# Patient Record
Sex: Male | Born: 1955 | Race: Black or African American | Hispanic: No | Smoking: Current every day smoker
Health system: Southern US, Community
[De-identification: ages and names within clinical notes are randomized; demographics above are authoritative.]

## PROBLEM LIST (undated history)

## (undated) DIAGNOSIS — F1190 Opioid use, unspecified, uncomplicated: Secondary | ICD-10-CM

## (undated) DIAGNOSIS — B1920 Unspecified viral hepatitis C without hepatic coma: Secondary | ICD-10-CM

## (undated) DIAGNOSIS — D759 Disease of blood and blood-forming organs, unspecified: Secondary | ICD-10-CM

## (undated) HISTORY — PX: KNEE SURGERY: SHX244

---

## 2006-10-27 ENCOUNTER — Emergency Department: Payer: Self-pay | Admitting: Emergency Medicine

## 2011-10-13 LAB — DRUG SCREEN, URINE
Amphetamines, Ur Screen: NEGATIVE (ref ?–1000)
Barbiturates, Ur Screen: NEGATIVE (ref ?–200)
Benzodiazepine, Ur Scrn: NEGATIVE (ref ?–200)
Cannabinoid 50 Ng, Ur ~~LOC~~: NEGATIVE (ref ?–50)
Cocaine Metabolite,Ur ~~LOC~~: NEGATIVE (ref ?–300)
MDMA (Ecstasy)Ur Screen: NEGATIVE (ref ?–500)
Methadone, Ur Screen: NEGATIVE (ref ?–300)
Opiate, Ur Screen: POSITIVE (ref ?–300)
Phencyclidine (PCP) Ur S: NEGATIVE (ref ?–25)
Tricyclic, Ur Screen: NEGATIVE (ref ?–1000)

## 2011-10-13 LAB — CBC
HCT: 41.6 % (ref 40.0–52.0)
HGB: 14 g/dL (ref 13.0–18.0)
MCH: 32.7 pg (ref 26.0–34.0)
MCHC: 33.6 g/dL (ref 32.0–36.0)
MCV: 97 fL (ref 80–100)
Platelet: 80 10*3/uL — ABNORMAL LOW (ref 150–440)
RBC: 4.27 10*6/uL — ABNORMAL LOW (ref 4.40–5.90)
RDW: 15.1 % — ABNORMAL HIGH (ref 11.5–14.5)
WBC: 8.4 10*3/uL (ref 3.8–10.6)

## 2011-10-13 LAB — SALICYLATE LEVEL: Salicylates, Serum: <1.7 mg/dL

## 2011-10-13 LAB — COMPREHENSIVE METABOLIC PANEL
Albumin: 3.1 g/dL — ABNORMAL LOW (ref 3.4–5.0)
Alkaline Phosphatase: 120 U/L (ref 50–136)
Anion Gap: 11 (ref 7–16)
Chloride: 108 mmol/L — ABNORMAL HIGH (ref 98–107)
Co2: 22 mmol/L (ref 21–32)
Creatinine: 0.72 mg/dL (ref 0.60–1.30)
EGFR (African American): >60
Glucose: 120 mg/dL — ABNORMAL HIGH (ref 65–99)
Osmolality: 281 (ref 275–301)
Potassium: 4.5 mmol/L (ref 3.5–5.1)
Total Protein: 9.4 g/dL — ABNORMAL HIGH (ref 6.4–8.2)

## 2011-10-13 LAB — ETHANOL
Ethanol %: <0.003 % (ref 0.000–0.080)
Ethanol: <3 mg/dL

## 2011-10-13 LAB — ACETAMINOPHEN LEVEL: Acetaminophen: <2 ug/mL

## 2011-10-13 LAB — TSH: Thyroid Stimulating Horm: 0.28 u[IU]/mL — ABNORMAL LOW

## 2011-10-14 ENCOUNTER — Inpatient Hospital Stay: Payer: Self-pay | Admitting: Internal Medicine

## 2011-10-14 LAB — CK TOTAL AND CKMB (NOT AT ARMC)
CK, Total: 143 U/L (ref 35–232)
CK-MB: <0.5 ng/mL — ABNORMAL LOW (ref 0.5–3.6)
CK-MB: 0.6 ng/mL (ref 0.5–3.6)

## 2011-10-14 LAB — TROPONIN I
Troponin-I: 0.07 ng/mL — ABNORMAL HIGH
Troponin-I: 0.04 ng/mL

## 2011-10-15 LAB — CBC WITH DIFFERENTIAL/PLATELET
Basophil %: 0.2 %
Eosinophil #: 0 10*3/uL (ref 0.0–0.7)
Eosinophil %: 0.1 %
HGB: 13.8 g/dL (ref 13.0–18.0)
Lymphocyte #: 2.2 10*3/uL (ref 1.0–3.6)
Lymphocyte %: 16.1 %
MCH: 32.6 pg (ref 26.0–34.0)
MCHC: 33.8 g/dL (ref 32.0–36.0)
Monocyte #: 0.7 10*3/uL (ref 0.0–0.7)
Monocyte %: 5 %
Neutrophil %: 78.6 %
Platelet: 94 10*3/uL — ABNORMAL LOW (ref 150–440)
RDW: 15 % — ABNORMAL HIGH (ref 11.5–14.5)
WBC: 13.7 10*3/uL — ABNORMAL HIGH (ref 3.8–10.6)

## 2011-10-15 LAB — COMPREHENSIVE METABOLIC PANEL
Albumin: 2.8 g/dL — ABNORMAL LOW (ref 3.4–5.0)
Alkaline Phosphatase: 107 U/L (ref 50–136)
Anion Gap: 11 (ref 7–16)
Calcium, Total: 8.8 mg/dL (ref 8.5–10.1)
Creatinine: 0.78 mg/dL (ref 0.60–1.30)
Glucose: 118 mg/dL — ABNORMAL HIGH (ref 65–99)
Potassium: 3.6 mmol/L (ref 3.5–5.1)
SGOT(AST): 26 U/L (ref 15–37)
SGPT (ALT): 14 U/L
Total Protein: 8.8 g/dL — ABNORMAL HIGH (ref 6.4–8.2)

## 2011-10-15 LAB — PHOSPHORUS: Phosphorus: 3.7 mg/dL (ref 2.5–4.9)

## 2011-10-16 LAB — CBC WITH DIFFERENTIAL/PLATELET
Basophil #: 0 10*3/uL (ref 0.0–0.1)
Basophil %: 0 %
Eosinophil #: 0 10*3/uL (ref 0.0–0.7)
Eosinophil %: 0.4 %
HCT: 38.3 % — ABNORMAL LOW (ref 40.0–52.0)
HGB: 12.8 g/dL — ABNORMAL LOW (ref 13.0–18.0)
Lymphocyte #: 2 10*3/uL (ref 1.0–3.6)
Lymphocyte %: 17.6 %
MCHC: 33.4 g/dL (ref 32.0–36.0)
MCV: 98 fL (ref 80–100)
Monocyte #: 0.6 10*3/uL (ref 0.0–0.7)
Monocyte %: 5.6 %
Neutrophil %: 76.4 %
RBC: 3.91 10*6/uL — ABNORMAL LOW (ref 4.40–5.90)
RDW: 14.8 % — ABNORMAL HIGH (ref 11.5–14.5)
WBC: 11.2 10*3/uL — ABNORMAL HIGH (ref 3.8–10.6)

## 2011-10-16 LAB — T4, FREE: Free Thyroxine: 1.32 ng/dL (ref 0.76–1.46)

## 2011-10-16 LAB — BASIC METABOLIC PANEL
Anion Gap: 9 (ref 7–16)
BUN: 9 mg/dL (ref 7–18)
Calcium, Total: 8.3 mg/dL — ABNORMAL LOW (ref 8.5–10.1)
Creatinine: 0.7 mg/dL (ref 0.60–1.30)
EGFR (Non-African Amer.): >60
Glucose: 136 mg/dL — ABNORMAL HIGH (ref 65–99)
Potassium: 3.1 mmol/L — ABNORMAL LOW (ref 3.5–5.1)

## 2011-10-16 LAB — URINALYSIS, COMPLETE
Bilirubin,UR: NEGATIVE
Glucose,UR: NEGATIVE mg/dL (ref 0–75)
Ketone: NEGATIVE
Ph: 6 (ref 4.5–8.0)
RBC,UR: 1 /HPF (ref 0–5)
Specific Gravity: 1.015 (ref 1.003–1.030)
Squamous Epithelial: NONE SEEN
WBC UR: 43 /HPF (ref 0–5)

## 2011-10-17 LAB — BASIC METABOLIC PANEL
BUN: 5 mg/dL — ABNORMAL LOW (ref 7–18)
Co2: 20 mmol/L — ABNORMAL LOW (ref 21–32)
Creatinine: 0.75 mg/dL (ref 0.60–1.30)
EGFR (Non-African Amer.): >60
Glucose: 124 mg/dL — ABNORMAL HIGH (ref 65–99)
Osmolality: 280 (ref 275–301)
Sodium: 141 mmol/L (ref 136–145)

## 2011-10-18 LAB — URINE CULTURE

## 2012-01-16 ENCOUNTER — Ambulatory Visit: Payer: Self-pay

## 2013-03-25 ENCOUNTER — Inpatient Hospital Stay: Payer: Self-pay | Admitting: Psychiatry

## 2013-03-25 LAB — DRUG SCREEN, URINE
Amphetamines, Ur Screen: NEGATIVE (ref ?–1000)
Barbiturates, Ur Screen: NEGATIVE (ref ?–200)
Benzodiazepine, Ur Scrn: NEGATIVE (ref ?–200)
MDMA (Ecstasy)Ur Screen: NEGATIVE (ref ?–500)
Phencyclidine (PCP) Ur S: NEGATIVE (ref ?–25)

## 2013-03-25 LAB — CBC
HCT: 39.2 % — ABNORMAL LOW (ref 40.0–52.0)
HGB: 13.7 g/dL (ref 13.0–18.0)
MCH: 32.8 pg (ref 26.0–34.0)
Platelet: 70 10*3/uL — ABNORMAL LOW (ref 150–440)
RBC: 4.17 10*6/uL — ABNORMAL LOW (ref 4.40–5.90)
WBC: 6.9 10*3/uL (ref 3.8–10.6)

## 2013-03-25 LAB — URINALYSIS, COMPLETE
Bacteria: NONE SEEN
Blood: NEGATIVE
Ketone: NEGATIVE
Leukocyte Esterase: NEGATIVE
Nitrite: NEGATIVE
Ph: 7 (ref 4.5–8.0)
Specific Gravity: 1.009 (ref 1.003–1.030)
Squamous Epithelial: <1
WBC UR: <1 /HPF (ref 0–5)

## 2013-03-25 LAB — COMPREHENSIVE METABOLIC PANEL
BUN: 6 mg/dL — ABNORMAL LOW (ref 7–18)
Bilirubin,Total: 0.9 mg/dL (ref 0.2–1.0)
Calcium, Total: 8.9 mg/dL (ref 8.5–10.1)
EGFR (African American): >60
EGFR (Non-African Amer.): >60
Osmolality: 264 (ref 275–301)
Potassium: 3.8 mmol/L (ref 3.5–5.1)
SGOT(AST): 34 U/L (ref 15–37)
SGPT (ALT): 14 U/L (ref 12–78)
Sodium: 133 mmol/L — ABNORMAL LOW (ref 136–145)
Total Protein: 8.8 g/dL — ABNORMAL HIGH (ref 6.4–8.2)

## 2013-03-25 LAB — ETHANOL: Ethanol: 48 mg/dL

## 2013-03-27 LAB — CBC WITH DIFFERENTIAL/PLATELET
Basophil %: 0.3 %
Eosinophil #: 0.1 10*3/uL (ref 0.0–0.7)
Eosinophil %: 1.3 %
HCT: 41.5 % (ref 40.0–52.0)
Lymphocyte #: 2 10*3/uL (ref 1.0–3.6)
Lymphocyte %: 36.5 %
MCHC: 33.9 g/dL (ref 32.0–36.0)
MCV: 95 fL (ref 80–100)
Monocyte %: 7 %
Neutrophil #: 2.9 10*3/uL (ref 1.4–6.5)
Neutrophil %: 54.9 %
Platelet: 70 10*3/uL — ABNORMAL LOW (ref 150–440)
RBC: 4.39 10*6/uL — ABNORMAL LOW (ref 4.40–5.90)
RDW: 14.6 % — ABNORMAL HIGH (ref 11.5–14.5)

## 2013-04-21 ENCOUNTER — Emergency Department: Payer: Self-pay | Admitting: Emergency Medicine

## 2013-04-21 LAB — COMPREHENSIVE METABOLIC PANEL
Albumin: 3.3 g/dL — ABNORMAL LOW (ref 3.4–5.0)
Alkaline Phosphatase: 116 U/L (ref 50–136)
Bilirubin,Total: 0.5 mg/dL (ref 0.2–1.0)
Calcium, Total: 8.6 mg/dL (ref 8.5–10.1)
Chloride: 105 mmol/L (ref 98–107)
EGFR (African American): >60
EGFR (Non-African Amer.): >60
Osmolality: 278 (ref 275–301)
Potassium: 3.8 mmol/L (ref 3.5–5.1)
SGOT(AST): 57 U/L — ABNORMAL HIGH (ref 15–37)
SGPT (ALT): 26 U/L (ref 12–78)
Total Protein: 8.6 g/dL — ABNORMAL HIGH (ref 6.4–8.2)

## 2013-04-21 LAB — SALICYLATE LEVEL: Salicylates, Serum: <1.7 mg/dL

## 2013-04-21 LAB — URINALYSIS, COMPLETE
Bacteria: NONE SEEN
Bilirubin,UR: NEGATIVE
Glucose,UR: NEGATIVE mg/dL (ref 0–75)
Leukocyte Esterase: NEGATIVE
Ph: 5 (ref 4.5–8.0)
Protein: 30
Specific Gravity: 1.02 (ref 1.003–1.030)
Squamous Epithelial: NONE SEEN

## 2013-04-21 LAB — CBC
HCT: 40.6 % (ref 40.0–52.0)
HGB: 13.9 g/dL (ref 13.0–18.0)
MCHC: 34.2 g/dL (ref 32.0–36.0)
Platelet: 143 10*3/uL — ABNORMAL LOW (ref 150–440)
RDW: 14.6 % — ABNORMAL HIGH (ref 11.5–14.5)

## 2013-04-21 LAB — TROPONIN I: Troponin-I: <0.02 ng/mL

## 2013-04-21 LAB — ETHANOL: Ethanol %: 0.104 % — ABNORMAL HIGH (ref 0.000–0.080)

## 2013-04-22 LAB — DRUG SCREEN, URINE
Amphetamines, Ur Screen: NEGATIVE (ref ?–1000)
Benzodiazepine, Ur Scrn: NEGATIVE (ref ?–200)
Cannabinoid 50 Ng, Ur ~~LOC~~: NEGATIVE (ref ?–50)
Cocaine Metabolite,Ur ~~LOC~~: NEGATIVE (ref ?–300)
MDMA (Ecstasy)Ur Screen: NEGATIVE (ref ?–500)
Methadone, Ur Screen: NEGATIVE (ref ?–300)
Opiate, Ur Screen: POSITIVE (ref ?–300)
Phencyclidine (PCP) Ur S: NEGATIVE (ref ?–25)
Tricyclic, Ur Screen: NEGATIVE (ref ?–1000)

## 2014-01-21 ENCOUNTER — Emergency Department: Payer: Self-pay | Admitting: Emergency Medicine

## 2014-01-21 LAB — COMPREHENSIVE METABOLIC PANEL
Albumin: 3.4 g/dL (ref 3.4–5.0)
Alkaline Phosphatase: 103 U/L
Anion Gap: 11 (ref 7–16)
BILIRUBIN TOTAL: 0.6 mg/dL (ref 0.2–1.0)
BUN: 9 mg/dL (ref 7–18)
Calcium, Total: 8.1 mg/dL — ABNORMAL LOW (ref 8.5–10.1)
Chloride: 105 mmol/L (ref 98–107)
Co2: 25 mmol/L (ref 21–32)
Creatinine: 0.9 mg/dL (ref 0.60–1.30)
EGFR (African American): >60
Glucose: 95 mg/dL (ref 65–99)
Osmolality: 280 (ref 275–301)
Potassium: 4 mmol/L (ref 3.5–5.1)
SGOT(AST): 80 U/L — ABNORMAL HIGH (ref 15–37)
SGPT (ALT): 28 U/L (ref 12–78)
Sodium: 141 mmol/L (ref 136–145)
Total Protein: 8.9 g/dL — ABNORMAL HIGH (ref 6.4–8.2)

## 2014-01-21 LAB — URINALYSIS, COMPLETE
BILIRUBIN, UR: NEGATIVE
Bacteria: NONE SEEN
Blood: NEGATIVE
Glucose,UR: NEGATIVE mg/dL (ref 0–75)
Ketone: NEGATIVE
LEUKOCYTE ESTERASE: NEGATIVE
NITRITE: NEGATIVE
PH: 5 (ref 4.5–8.0)
PROTEIN: NEGATIVE
RBC,UR: 2 /HPF (ref 0–5)
Specific Gravity: 1.018 (ref 1.003–1.030)

## 2014-01-21 LAB — DRUG SCREEN, URINE
AMPHETAMINES, UR SCREEN: NEGATIVE (ref ?–1000)
BARBITURATES, UR SCREEN: NEGATIVE (ref ?–200)
Benzodiazepine, Ur Scrn: NEGATIVE (ref ?–200)
CANNABINOID 50 NG, UR ~~LOC~~: NEGATIVE (ref ?–50)
COCAINE METABOLITE, UR ~~LOC~~: POSITIVE (ref ?–300)
MDMA (Ecstasy)Ur Screen: NEGATIVE (ref ?–500)
METHADONE, UR SCREEN: POSITIVE (ref ?–300)
OPIATE, UR SCREEN: POSITIVE (ref ?–300)
Phencyclidine (PCP) Ur S: NEGATIVE (ref ?–25)
Tricyclic, Ur Screen: NEGATIVE (ref ?–1000)

## 2014-01-21 LAB — CBC
HCT: 42.7 % (ref 40.0–52.0)
HGB: 14.5 g/dL (ref 13.0–18.0)
MCH: 34.6 pg — ABNORMAL HIGH (ref 26.0–34.0)
MCHC: 33.9 g/dL (ref 32.0–36.0)
MCV: 102 fL — AB (ref 80–100)
Platelet: 148 10*3/uL — ABNORMAL LOW (ref 150–440)
RBC: 4.17 10*6/uL — ABNORMAL LOW (ref 4.40–5.90)
RDW: 14.9 % — ABNORMAL HIGH (ref 11.5–14.5)
WBC: 9.8 10*3/uL (ref 3.8–10.6)

## 2014-01-21 LAB — ACETAMINOPHEN LEVEL

## 2014-01-21 LAB — SALICYLATE LEVEL: Salicylates, Serum: 1.9 mg/dL

## 2014-01-21 LAB — ETHANOL
ETHANOL LVL: 226 mg/dL
Ethanol %: 0.226 % — ABNORMAL HIGH (ref 0.000–0.080)

## 2014-11-06 NOTE — H&P (Signed)
PATIENT NAME:  Andrew Gibbs, Andrew Gibbs MR#:  [id number] DATE OF BIRTH:  April 22, 1956  DATE OF ADMISSION:  03/25/2013  DATE OF DICTATION:  03/26/2013  IDENTIFYING INFORMATION AND CHIEF COMPLAINT: A 59 year old man with a history of substance abuse, admitted to the hospital with chief complaint "I need detox."   HISTORY OF PRESENT ILLNESS: Information obtained from the patient and the chart. The patient came to the hospital voluntarily yesterday, stating that he wanted to stop using heroin and alcohol. He has been using a couple of bags of heroin a day after a recent relapse. It sounds like prior to that, he had had a month or so of sobriety, but has been struggling with opiate dependence for a long time. The day of admission, he says that he was just sitting around drinking a beer, getting a couple of bags of heroin, when he realized that he was sick of living like that. The patient values his relationship with his children and grandchildren and with his elderly mother, and would like to improve his health. His mood has been a little bit down, but he denies any suicidal ideation. Denies any psychotic symptoms. His wife passed away earlier this year, which has been a stress for him. The patient says that he was feeling "dope sick" before coming into the hospital, and was tired of having to chase that problem with getting high.   PAST PSYCHIATRIC HISTORY:  He has been admitted to inpatient substance abuse treatment programs. He has been in detox programs as well. He had recently been getting outpatient treatment at [facility], but did not care for the treatment, and thought that at times it was causing him to relapse. He denies any history of suicide attempts. Denies any history of violence. He is not on any other psychiatric medicines. It looks like he had an admission here most recently April 2013 for DTs. He says that after that he cut back on his drinking. Now he drinks a few beers, he claims, a week at most.    MEDICAL HISTORY: The patient has hepatitis C. Does not know of any other significant ongoing medical problems. He says that his outpatient physician and he have discussed and decided not to initiate any treatment for it at this time.   FAMILY HISTORY:  Positive for substance abuse.   SOCIAL HISTORY: The patient's wife recently passed away. He has 2 adult children, and values his relationship with them. The patient is on disability for chronic pain in his knee and his hepatitis C. He lives with his elderly mother, who has cancer. He spends his days mostly passing time with friends of his in the neighborhood.   CURRENT MEDICATIONS:  He was on aspirin 81 mg a day. No other noted current medications.   ALLERGIES: No known drug allergies.   REVIEW OF SYSTEMS:  Still feeling a little bit achy, but better than he was before. A little bit tired. Not throwing up. Able to eat well. He feels a little unsteady on his feet. Denies suicidal ideation. Denies any hallucinations.   MENTAL STATUS EXAMINATION: A somewhat disheveled and chronically ill-looking gentleman, who looks older than his stated age. Cooperative with the interview. Eye contact intermittent. Psychomotor activity slow and sluggish. Speech slow and quiet. Affect blunted. Mood stated as being okay. Thoughts appear to be lucid, without loosening of associations, but with some slowing. Denies auditory or visual hallucinations. Denies suicidal or homicidal ideation. Shows adequate judgment and insight. Normal intelligence. Alert and oriented.  PHYSICAL EXAMINATION: SKIN:  No skin lesions identified.  HEENT: Pupils equal and reactive. Eyes bloodshot. Some chronic poor dentition. Face  symmetric.  MUSCULOSKELETAL: Full range of motion at extremities. Gait: A little bit of a limp and a little unsteady.  NEUROLOGIC: Strength and reflexes normal and symmetric throughout. Cranial nerves symmetric and normal.  LUNGS:  Clear, without wheezes.  HEART:   Regular rate and rhythm.  ABDOMEN:  Soft, nontender. Normal bowel sounds.  VITAL SIGNS:  Temperature 98.6, pulse 67, respirations 18, blood pressure 111/71.   LABORATORY RESULTS: Platelet count low at 70, hematocrit low at 39.2. Chemistry panel shows a low creatinine at 0.57, sodium low at 153. Liver function tests okay, total protein slightly elevated at 8.8, but albumin low at 3.3. Alcohol level was 48 on admission. TSH normal at 1.86. Urinalysis unremarkable. Drug screen positive for opiates.   ASSESSMENT: A 59 year old man with opiate dependence and alcohol dependence. He is minimizing his alcohol use, but clearly had been drinking at least a little bit recently. Has a history of DTs. Most acutely, however, needs opiate detox. The patient is currently showing a flat affect and is somewhat blunted and slow, but denies any suicidal ideation, and reports that he wants to get longer term inpatient treatment.   TREATMENT PLAN: He was put on medication both for alcohol withdrawal and for opiate withdrawal, including a 3-day treatment with Suboxone, plus non-controlled medications for symptomatic relief. He will be monitored as far as his vital signs, and will be included in groups and activities on the unit, including substance abuse groups. We are already working on referral to the alcohol and drug abuse treatment center. I am going to need to recheck his blood counts to make sure his platelets are not getting even worse. Psychoeducation and supportive therapy done.   DIAGNOSIS, PRINCIPAL AND PRIMARY: AXIS I:  Opiate dependence.  SECONDARY DIAGNOSES: AXIS I:  Alcohol dependence.   AXIS II:  Deferred.   AXIS III:  Hepatitis C, thrombocytopenia, alcohol withdrawal, opiate withdrawal.   AXIS IV: Severe from his chronic illness, taking care of his elderly mother, decreased resources.   AXIS V:  Functioning at time of evaluation 20.     ____________________________ Gonzella Lex,  MD jtc:mr D: 03/26/2013 19:05:36 ET T: 03/26/2013 19:29:32 ET JOB#: [id number]  cc: Gonzella Lex, MD, <Dictator> Gonzella Lex MD ELECTRONICALLY SIGNED 03/26/2013 21:11

## 2014-11-06 NOTE — Discharge Summary (Signed)
PATIENT NAME:  Andrew Gibbs, Andrew Gibbs MR#:  [id number] DATE OF BIRTH:  29-Mar-1956  DATE OF ADMISSION:  03/25/2013 DATE OF DISCHARGE:  04/01/2013  HOSPITAL COURSE: See dictated history and physical for details of admission. This 59 year old man with a history of opiate abuse came into the hospital seeking detox and referral to inpatient substance abuse treatment. He was detoxed with 3 days of prescription of tapering Suboxone, which he tolerated well. He was also provided with p.r.n. medication for other symptoms of opiate withdrawal. The patient was fatigued at first, but got through the withdrawal without any complications. He participated in groups appropriately. He showed appropriate motivation for substance abuse treatment. He requested to be referred to [facility] [place] and was afraid that if he left prior to being able to go there directly, he was at high risk of relapse. The patient was able to talk about his recent sadness related to the death of his wife. He did not, however, appear to have a major depressive syndrome. A bed was available for him at [facility], and he was discharged with a plan that his brother was going to transfer him there as the patient was voluntary. One medical complication that arose was a finding of thrombocytopenia. This was persistent and did not appear to be getting worse, and he was having no symptoms from it. At no time did the patient engage in any dangerous behavior or voice any suicidal ideation.   MENTAL STATUS EXAMINATION AT DISCHARGE: Neatly dressed and groomed man, looks his stated age, cooperative with the interview. Good eye contact, pleasant affect, appropriate and reactive. Mood is stated as being good. He says that he is excited about going to the [facility] [place]. Thoughts appear lucid, without any sign of loosening of associations or delusions. Denies any suicidal or homicidal ideation. Indicates that he is optimistic  and trying to think positively about the future. Insight appears to be good, judgment good. Intelligence normal. Alert and oriented x4.   LABORATORY RESULTS: Admission labs included a drug screen positive for opiates, TSH normal at 1.8, alcohol 48. Chemistry panel showed a low sodium at 133, creatinine low at 0.57, glucose slightly elevated at 106, total protein elevated at 8.8, albumin low at 3.3. CBC was most remarkable for a platelet count of 70. Urinalysis was normal. Followup CBC repeated on the 11th showed a platelet count still at 40, but with the rest of his indices within the normal limits.   DISCHARGE MEDICATION: Aspirin 81 mg per day.   DISPOSITION: The patient is discharged in the company of his family to be transferred directly to the [facility] [place].   DIAGNOSIS, PRINCIPAL AND PRIMARY:  AXIS I: Opiate dependence.   SECONDARY DIAGNOSES:  AXIS I:  1. Alcohol abuse.  2. Substance-induced mood disorder. AXIS II: No diagnosis.  AXIS III: Thrombocytopenia of unclear etiology, possibly chronic alcohol use.  AXIS IV: Severe from recent death of his wife.  AXIS V: Functioning at time of discharge 65.    ____________________________ Gonzella Lex, MD jtc:OSi D: 04/01/2013 10:44:20 ET T: 04/01/2013 10:58:10 ET JOB#: [id number]  cc: Gonzella Lex, MD, <Dictator> Gonzella Lex MD ELECTRONICALLY SIGNED 04/02/2013 11:50

## 2014-11-07 NOTE — Consult Note (Signed)
Brief Consult Note: Diagnosis: Polysubstance dependence.   Patient was seen by consultant.   Consult note dictated.   Recommend further assessment or treatment.   Comments: Mr. Zuercher has a h/o substance abuse. Yesterday he had more thatn "normal" amount of alcohol. The mother petitioned him for disorderly behavior. He is not allowed to return home. He is not suicidal, homicidal or agitated. He is cool and collected. He declines substance abuse treatment and intends to continue drinking for now.  PLAN: 1. The patient no longer meets criteria for IVC. I will terminate proceedings. Please discharge as appropriate.  2. No medications recommended.  3. Declines substance abuse treatment.  4. Discharge to the homeless shelter.  Electronic Signatures: Orson Slick (MD)  (Signed 09-Jul-15 14:16)  Authored: Brief Consult Note   Last Updated: 09-Jul-15 14:16 by Orson Slick (MD)

## 2014-11-07 NOTE — Consult Note (Signed)
PATIENT NAME:  Andrew Gibbs, Andrew Gibbs MR#:  [id number] DATE OF BIRTH:  10/28/1955  DATE OF CONSULTATION:  01/22/2014  REFERRING PHYSICIAN:  Ferman Hamming, MD CONSULTING PHYSICIAN:  Eris Breck B. Adrienna Karis, MD  REASON FOR CONSULTATION: To evaluate an alcoholic patient.   IDENTIFYING DATA: Andrew Gibbs is a 59 year old male with history of polysubstance dependence.   CHIEF COMPLAINT: "I don't need detox."   HISTORY OF PRESENT ILLNESS: Andrew Gibbs has a long history of substance abuse. He was hospitalized at [facility] in September of 2014 and detoxed from alcohol. He was then sent for treatment to [facility] facility in [place]. He completed 2 week treatment. He maintained some sobriety after, but returned to his drug habit pretty quickly. He was petitioned by his mother. He has been a binge drinker and yesterday consumed more than usual alcohol, became belligerent, was yelling and screaming, the mother was frightened and called the police. He also was positive for cocaine and opiates on admission. The patient admits to drinking and has no intention to quit. The mother does not want him to return to home. She has bone cancer and needs some peace and quiet. Also, she complained that in the past the patient would steal medications from her and she wants to avoid that. She wants him to go to the homeless shelter. The patient denies any symptoms of depression, anxiety, or psychosis. No symptoms suggestive of bipolar mania. No desire to stop using alcohol.   PAST PSYCHIATRIC HISTORY: Multiple stents at substance abuse treatment programs and never maintained sobriety. Denies suicide attempts. Denies any symptoms of mental illness and at the moment is not interested in psychiatric treatment or sobering up. He cannot tell me how much he has been drinking. Ordinarily 3 or 4 beers a night, but at times he drinks more, and he knows that he behaves poorly.  FAMILY PSYCHIATRIC HISTORY: Positive for  substance abuse.   PAST MEDICAL HISTORY: Hepatitis C.   ALLERGIES: No known drug allergies.   MEDICATIONS ON ADMISSION: None.   SOCIAL HISTORY: He is on disability after he broke his knee, has been disabled for 4 years, lives with his mother.   REVIEW OF SYSTEMS: CONSTITUTIONAL: No fevers or chills. No weight changes.  EYES: No double or blurred vision.  ENT: No hearing loss.  RESPIRATORY: No shortness of breath or cough.  CARDIOVASCULAR: No chest pain or orthopnea.  GASTROINTESTINAL: No abdominal pain, nausea, vomiting, or diarrhea.  GENITOURINARY: No incontinence or frequency.  ENDOCRINE: No heat or cold intolerance.  LYMPHATIC: No anemia or easy bruising.  INTEGUMENTARY: No acne or rash.  MUSCULOSKELETAL: No muscle or joint pain.  NEUROLOGIC: No tingling or weakness.  PSYCHIATRIC: See history of present illness for details.   PHYSICAL EXAMINATION: VITAL SIGNS: Blood pressure 144/69, pulse 65, respirations 18, temperature 98.2.  GENERAL: This is a well-developed male in no acute distress. The rest of the physical examination is deferred to his primary attending.   LABORATORY DATA: Chemistries are within normal limits. Blood alcohol level on admission 0.226. LFTs within normal limits, except for AST of 80. Urine tox screen positive for cocaine, opiates and methadone. CBC within normal limits with low platelets of 148. Urinalysis is not suggestive of urinary tract infection. Serum acetaminophen and salicylates are low.   MENTAL STATUS EXAMINATION: The patient is alert and oriented to person, place, time and situation. He is pleasant, polite and cooperative. He is well groomed, wearing hospital scrubs. He maintains good eye contact. His speech  is of normal rhythm, rate and volume. Mood is fine with full affect. Thought process is logical and goal oriented. Thought content: He denies thoughts of hurting himself or others. There are no delusions or paranoia. There are no auditory or  visual hallucinations. His cognition is grossly intact. Registration, recall, long and short-term memory are intact. He is of average intelligence and fund of knowledge. His insight and judgment are poor.   DIAGNOSES: AXIS I: Polysubstance dependence.  AXIS II: Deferred.  AXIS III: Hepatitis C, status post kneecap fracture.  AXIS III: Substance abuse, housing, conflict with the mother.  AXIS V: Global assessment of functioning 55.   PLAN:  1.  The patient no longer meets criteria for involuntary inpatient psychiatric commitment. I will terminate proceedings. Please discharge as appropriate.  2.  No medications recommended.  3.  He will be discharged to a homeless shelter.  4.  He is connected to [facility] and may choose to follow up with them.  ____________________________ Herma Ard B. Bary Leriche, MD jbp:sb D: 01/22/2014 16:30:43 ET T: 01/22/2014 17:09:19 ET JOB#: [id number]  cc: Dorinne Graeff B. Bary Leriche, MD, <Dictator> Clovis Fredrickson MD ELECTRONICALLY SIGNED 01/28/2014 4:57

## 2014-11-08 NOTE — H&P (Signed)
PATIENT NAME:  Andrew Gibbs, Andrew Gibbs MR#:  [id number] DATE OF BIRTH:  1955-11-26  DATE OF ADMISSION:  10/14/2011  ED REFERRING PHYSICIAN: Dr. Conni Slipper   PRIMARY CARE PHYSICIAN: Unknown   REASON FOR ADMISSION: Altered mental status, elevated blood pressure, possible delirium tremens.   CHIEF COMPLAINT: Nausea, agitation, "altered mental status".  HISTORY OF PRESENT ILLNESS: This is a 59 year old African American male with past medical history of heroin and alcohol abuse per records. History is not obtained from the patient secondary to the patient being sedated from altered mental status and receiving benzodiazepines for possible DT's. History is obtained from limited records and the police officer and ED physician. Per history, the patient admitted himself to a nearby detox facility on Thursday, March 28th. The police officer who brought the patient over states he knows the patient very well from the community and he says that the patient is a chronic alcoholic and sometimes he is a heroin user also. He admitted himself to the detox facility on Thursday the 28th and then started having agitation, altered mental status, and some nausea associated with vomiting today. He may have had his last drink on Wednesday or Tuesday maybe. He was given multiple doses of Ativan in the ED and CT of the head was attempted to rule out any other organic causes that could cause his altered mental status but the patient would not stay still for the CT. Precedex drip is currently being administered and the patient will go back for CT. Hospitalist services were consulted for further inpatient workup and management.   PAST MEDICAL HISTORY: Unable to obtain.  MEDICATIONS: Medications per records are aspirin 81 mg p.o.   PAST SURGICAL HISTORY: Unable to obtain.   ALLERGIES: Unable to obtain.   FAMILY HISTORY: Unable to obtain.  SOCIAL HISTORY: Unable to obtain.  REVIEW OF SYSTEMS: Unable to obtain. Per records,  nausea, vomiting, agitation, and altered omental status.  PHYSICAL EXAMINATION:  VITAL SIGNS: Temperature 98.3, blood pressure 175/80, respirations 18, heart rate 73.  GENERAL APPEARANCE: Well developed, well nourished male laying in bed currently sedated in no acute respiratory distress.    HEENT: Pupils equal, round, and reactive to light and accommodation. Extraocular movements intact. No scleral icterus or conjunctivitis. No difficulty hearing.   NECK: No thyroid enlargement or thyroid nodules. Neck is supple, nontender.  RESPIRATORY: Clear to auscultation bilaterally. No rales, rhonchi, or crackles.   CARDIOVASCULAR: Regular rate, regular rhythm. No murmurs. No rubs. No gallops. Good pedal pulses are noted in the lower extremities.  ABDOMEN: Soft, nontender, and nondistended. Positive bowel sounds.  MUSCULOSKELETAL: 5 out of 5 strength in bilateral upper and lower extremities. No cyanosis or degenerative joint disease.  SKIN: No rash, lesions, erythema, or nodules. Of note, the patient does have some track lines from chronic IV abuse of heroin.  LYMPH: No adenopathy in the cervical, axilla, or supraclavicular regions.  NEUROLOGIC: Unable to properly assess but sensory is intact to pain and verbal stimulation.  PSYCH: The patient is currently sedated. Unable to assess.   LABORATORY, DIAGNOSTIC, AND RADIOLOGICAL DATA: Sodium 141, potassium 4.5, chloride 108, bicarb 22, BUN 10, creatinine 0.72, glucose 120, AST 42, ALT 16. TSH is 0.28. Ethanol level less than 0.003%. Urine drug screen shows positive opiates. White cell count 8.4, hemoglobin 14.0, hematocrit 41.6, platelet count 80. Tylenol level less than 2. Salicylate level less than 1.7. EKG shows normal sinus rhythm. No acute changes ST-T wave changes. CT of the head pending. Chest x-ray  is pending.   ASSESSMENT AND PLAN: This is a 59 year old male with past medical history of substance abuse, specifically alcohol and heroin,  presenting with nausea, vomiting, elevated blood pressure, and altered mental status. 1. Delirium tremens most likely cause of the above symptoms. Will place patient on CIWA protocol. At this time will place him on Precedex drip and have p.r.n. Ativan and Haldol as needed. Will continue to monitor the patient for any kind of seizure precautions and fall precautions. Wean Precedex drip as tolerated. Will also start him on a banana bag with multivitamins. Check blood cultures. Follow-up on head CT and chest x-ray. Continue with supportive care. Will monitor his electrolytes, specifically his magnesium and his phos. His alcohol level is less than 0.0003%. His last drink may have been about two to three days ago explaining the symptoms he is currently having at this time.  2. Agitation secondary to alcohol withdrawal. Management as stated above. 3. Elevated blood pressure, unknown if the patient has a diagnosis of hypertension but at this time his elevated blood pressure most likely is secondary to DT's. Will continue Precedex drip and p.r.n. nitro patch to maintain a blood pressure less than 140. 4. Altered mental status secondary to DT's. Will follow-up head CT and continue with management as above. Check also blood cultures and electrolytes closely. 5. Decreased TSH, might be slightly hypothyroid due to his chronic alcohol use. We will check a free T4 in the morning and recheck a TSH in two to three days.  6. Thrombocytopenia most likely secondary to bone marrow suppression from chronic alcohol use. Will follow closely. If platelets continue to trend down, will need further workup and management, possibly Hematology/Oncology consult. 7. GI and DVT prophylaxis. Continue with IV PPI. Will actually put him on SCD's versus heparin since he is thrombocytopenic.   TIME SPENT DICTATING AND EVALUATING THE PATIENT: 40 minutes.  CODE STATUS: FULL CODE.  ____________________________ Vilinda Boehringer,  MD vm:drc D: 10/14/2011 02:13:00 ET T: 10/14/2011 09:22:19 ET JOB#: [id number] cc: Vilinda Boehringer, MD, <Dictator> Vilinda Boehringer MD ELECTRONICALLY SIGNED 10/14/2011 17:43

## 2014-11-08 NOTE — Discharge Summary (Signed)
PATIENT NAME:  Andrew Gibbs, Andrew Gibbs MR#:  [id number] DATE OF BIRTH:  Dec 17, 1955  DATE OF ADMISSION:  10/14/2011 DATE OF DISCHARGE:  10/18/2011  HISTORY OF PRESENT ILLNESS: The patient is a 59 year old African American male who has a history of heroin and alcohol abuse per records. The patient had admitted himself to nearby detox facility on Thursday voluntarily. The patient started having withdrawal symptoms, therefore he was sent here for further evaluation. The patient was having active DTs and was very agitated and had to be placed on Precedex drip and was placed in the ICU. The patient subsequently started having low heart rate because of the Precedex. That was discontinued. He was continued on CIWA protocol. With these interventions, the patient's agitation and delirium  has since resolved. Now the patient is doing much better and wants to be discharged. He will be discharged home with his mother and then he will go voluntarily to the outpatient substance abuse facility. At this time he is stable for discharge.   DISCHARGE MEDICATIONS: Aspirin 81 one tab p.o. daily as previously.   DIET: Low sodium.  TIMEFRAME FOR FOLLOW UP: 1 to 2 weeks with primary MD. The patient is to resume substance abuse as previously prior to his hospitalization.   TIME SPENT: 35 minutes.   ____________________________ Lafonda Mosses Posey Pronto, MD shp:drc D: 10/18/2011 13:12:57 ET T: 10/18/2011 13:22:04 ET JOB#: [id number]  cc: Jesseka Drinkard H. Posey Pronto, MD, <Dictator> Alric Seton MD ELECTRONICALLY SIGNED 10/20/2011 12:17

## 2015-06-09 ENCOUNTER — Emergency Department: Payer: Medicare Other

## 2015-06-09 ENCOUNTER — Encounter: Payer: Self-pay | Admitting: Emergency Medicine

## 2015-06-09 ENCOUNTER — Emergency Department
Admission: EM | Admit: 2015-06-09 | Discharge: 2015-06-09 | Disposition: A | Discharge status: Home / Self Care | Payer: Medicare Other | Attending: Emergency Medicine | Admitting: Emergency Medicine

## 2015-06-09 DIAGNOSIS — F17200 Nicotine dependence, unspecified, uncomplicated: Secondary | ICD-10-CM | POA: Diagnosis not present

## 2015-06-09 DIAGNOSIS — R0602 Shortness of breath: Secondary | ICD-10-CM | POA: Diagnosis not present

## 2015-06-09 DIAGNOSIS — F1310 Sedative, hypnotic or anxiolytic abuse, uncomplicated: Secondary | ICD-10-CM | POA: Insufficient documentation

## 2015-06-09 DIAGNOSIS — F1193 Opioid use, unspecified with withdrawal: Secondary | ICD-10-CM

## 2015-06-09 DIAGNOSIS — F1123 Opioid dependence with withdrawal: Secondary | ICD-10-CM | POA: Diagnosis not present

## 2015-06-09 DIAGNOSIS — F1010 Alcohol abuse, uncomplicated: Secondary | ICD-10-CM | POA: Diagnosis not present

## 2015-06-09 DIAGNOSIS — R079 Chest pain, unspecified: Secondary | ICD-10-CM | POA: Insufficient documentation

## 2015-06-09 DIAGNOSIS — F1910 Other psychoactive substance abuse, uncomplicated: Secondary | ICD-10-CM

## 2015-06-09 HISTORY — DX: Opioid use, unspecified, uncomplicated: F11.90

## 2015-06-09 HISTORY — DX: Unspecified viral hepatitis C without hepatic coma: B19.20

## 2015-06-09 LAB — CBC WITH DIFFERENTIAL/PLATELET
BASOS ABS: 0 10*3/uL (ref 0–0.1)
Basophils Relative: 0 %
EOS PCT: 0 %
Eosinophils Absolute: 0 10*3/uL (ref 0–0.7)
HCT: 43.9 % (ref 40.0–52.0)
Hemoglobin: 14.7 g/dL (ref 13.0–18.0)
Lymphocytes Relative: 21 %
Lymphs Abs: 1.8 10*3/uL (ref 1.0–3.6)
MCH: 31.8 pg (ref 26.0–34.0)
MCHC: 33.4 g/dL (ref 32.0–36.0)
MCV: 95.3 fL (ref 80.0–100.0)
MONO ABS: 0.5 10*3/uL (ref 0.2–1.0)
MONOS PCT: 6 %
NEUTROS PCT: 73 %
Neutro Abs: 6.2 10*3/uL (ref 1.4–6.5)
PLATELETS: 101 10*3/uL — AB (ref 150–440)
RBC: 4.61 MIL/uL (ref 4.40–5.90)
RDW: 13.6 % (ref 11.5–14.5)
WBC: 8.6 10*3/uL (ref 3.8–10.6)

## 2015-06-09 LAB — URINE DRUG SCREEN, QUALITATIVE (ARMC ONLY)
Amphetamines, Ur Screen: NEGATIVE — AB
BARBITURATES, UR SCREEN: NEGATIVE — AB
BENZODIAZEPINE, UR SCRN: POSITIVE — AB
CANNABINOID 50 NG, UR ~~LOC~~: NEGATIVE — AB
Cocaine Metabolite,Ur ~~LOC~~: NEGATIVE — AB
MDMA (Ecstasy)Ur Screen: NEGATIVE — AB
METHADONE SCREEN, URINE: NEGATIVE — AB
OPIATE, UR SCREEN: NEGATIVE — AB
PHENCYCLIDINE (PCP) UR S: NEGATIVE — AB
Tricyclic, Ur Screen: NEGATIVE — AB

## 2015-06-09 LAB — BASIC METABOLIC PANEL
Anion gap: 8 (ref 5–15)
BUN: 6 mg/dL (ref 6–20)
CO2: 24 mmol/L (ref 22–32)
Calcium: 9.3 mg/dL (ref 8.9–10.3)
Chloride: 105 mmol/L (ref 101–111)
Creatinine, Ser: 0.51 mg/dL — ABNORMAL LOW (ref 0.61–1.24)
GFR calc Af Amer: >60 mL/min (ref >60)
GFR calc non Af Amer: >60 mL/min (ref >60)
GLUCOSE: 98 mg/dL (ref 65–99)
Potassium: 4 mmol/L (ref 3.5–5.1)
Sodium: 137 mmol/L (ref 135–145)

## 2015-06-09 LAB — ETHANOL: Alcohol, Ethyl (B): 24 mg/dL — ABNORMAL HIGH (ref <5)

## 2015-06-09 LAB — TROPONIN I: Troponin I: <0.03 ng/mL (ref <0.031)

## 2015-06-09 MED ORDER — ONDANSETRON HCL 4 MG/2ML IJ SOLN: 4.0000 mg | Freq: Once | INTRAMUSCULAR | Status: DC

## 2015-06-09 MED ORDER — ONDANSETRON HCL 4 MG PO TABS: 4.0000 mg | ORAL_TABLET | Freq: Three times a day (TID) | ORAL | Status: DC | PRN

## 2015-06-09 MED ORDER — ONDANSETRON 4 MG PO TBDP
4.0000 mg | ORAL_TABLET | Freq: Once | ORAL | Status: AC
  Administered 2015-06-09: 4 mg via ORAL
  Filled 2015-06-09: qty 1

## 2015-06-09 NOTE — ED Provider Notes (Signed)
[facility] Emergency Department Provider Note   ____________________________________________  Time seen: On EMS arrival I have reviewed the triage vital signs and the triage nursing note.  HISTORY  Chief Complaint Chest Pain   Historian Patient  HPI Andrew Gibbs is a 59 y.o. male came by EMS requesting "detox "from heroin. Patient states he had been using heroin up to 10 bags per day, last used about 24 hours ago. He is now having yawning and restlessness, and nausea. Patient developed some central chest pain which is sharp and somewhat associated with shortness of breath this morning and has been intermittent but ongoing all day long. He has been having some chest pains for about 3 days. No history of coronary artery disease. History of cocaine use, but denies any recent use. Patient states he does drink 40 oz etoh daily.  Last drink this morning.  He has had nausea and mild vomiting. No constipation or diarrhea.    Past Medical History  Diagnosis Date  . Heroin use   . Hepatitis C     There are no active problems to display for this patient.   Past Surgical History  Procedure Laterality Date  . Knee surgery      Current Outpatient Rx  Name  Route  Sig  Dispense  Refill  . ondansetron (ZOFRAN) 4 MG tablet   Oral   Take 1 tablet (4 mg total) by mouth every 8 (eight) hours as needed for nausea or vomiting.   10 tablet   0     Allergies Review of patient's allergies indicates no known allergies.  No family history on file.  Social History Social History  Substance Use Topics  . Smoking status: Current Every Day Smoker  . Smokeless tobacco: None  . Alcohol Use: Yes     Comment: 40 oz today    Review of Systems  Constitutional: Negative for fever. Eyes: Negative for visual changes. ENT: Negative for sore throat. Cardiovascular: Positive for chest pain. Respiratory: Positive for shortness of breath. Gastrointestinal: Positive for  vomiting. Genitourinary: Negative for dysuria. Musculoskeletal: Negative for back pain. Skin: Negative for rash. Neurological: Negative for headache. 10 point Review of Systems otherwise negative ____________________________________________   PHYSICAL EXAM:  VITAL SIGNS: ED Triage Vitals  Enc Vitals Group     BP 06/09/15 1648 169/84 mmHg     Pulse Rate 06/09/15 1648 56     Resp 06/09/15 1648 22     Temp 06/09/15 1648 98.2 F (36.8 C)     Temp Source 06/09/15 1648 Oral     SpO2 06/09/15 1648 100 %     Weight 06/09/15 1648 125 lb (56.7 kg)     Height 06/09/15 1648 5\' 7"  (1.702 m)     Head Cir --      Peak Flow --      Pain Score 06/09/15 1649 4     Pain Loc --      Pain Edu? --      Excl. in [place]? --      Constitutional: Alert and oriented. Well appearing and overall and in no distress. Eyes: Conjunctivae are ruddy. PERRL. Normal extraocular movements. ENT   Head: Normocephalic and atraumatic.   Nose: No congestion/rhinnorhea.   Mouth/Throat: Mucous membranes are moist.   Neck: No stridor. Cardiovascular/Chest: Normal rate, regular rhythm.  No murmurs, rubs, or gallops. Respiratory: Normal respiratory effort without tachypnea nor retractions. Breath sounds are clear and equal bilaterally. No wheezes/rales/rhonchi. Gastrointestinal: Soft. No  distention, no guarding, no rebound. Nontender   Genitourinary/rectal:Deferred Musculoskeletal: Nontender with normal range of motion in all extremities. No joint effusions.  No lower extremity tenderness.  No edema. Multiple track marks and upper arms. Neurologic:  Normal speech and language. No gross or focal neurologic deficits are appreciated. Skin:  Skin is warm, dry and intact. No rash noted. Psychiatric: Mood and affect are normal. Speech and behavior are normal. Patient exhibits appropriate insight and judgment. No suicidal ideation.  ____________________________________________   EKG I, Lisa Roca, MD, the  attending physician have personally viewed and interpreted all ECGs.  56 bpm. Normal sinus rhythm. Narrow QRS. Normal axis. Nonspecific ST and T-wave. ____________________________________________  LABS (pertinent positives/negatives)  Basic metabolic panel without significant abnormalities Troponin less than 0.03 CBC within normal limits except platelet count 101 Ethanol 24  ____________________________________________  RADIOLOGY All Xrays were viewed by me. Imaging interpreted by Radiologist.  Chest: Negative __________________________________________  PROCEDURES  Procedure(s) performed: None  Critical Care performed: None  ____________________________________________   ED COURSE / ASSESSMENT AND PLAN  CONSULTATIONS: None  Pertinent labs & imaging results that were available during my care of the patient were reviewed by me and considered in my medical decision making (see chart for details).   The patient came in asking for detox from heroin. I did discuss with him that we do not have any inpatient resources for opiod detox. He has some nausea and was given an antinausea medication. We discussed symptomatic control.  He does abuse alcohol but does not want help with alcohol detox.  In terms of the chest discomfort, it sounds like the chest pain has actually been going on for 3 days now, and his EKG is nonspecific and his troponin is negative.  Patient became somewhat belligerant with me regarding not admitting him for opiod detox.  I have referred him to [facility] for outpatient evaluation.  We have discussed return precautions with regard to chest discomfort as well as alcohol withdrawal.  Patient / Family / Caregiver informed of clinical course, medical decision-making process, and agree with plan.   I discussed return precautions, follow-up instructions, and discharged instructions with patient and/or family.  ___________________________________________   FINAL  CLINICAL IMPRESSION(S) / ED DIAGNOSES   Final diagnoses:  Heroin withdrawal ([place])  Chest pain, unspecified chest pain type  Alcohol abuse  Polysubstance abuse       Lisa Roca, MD 06/09/15 2130

## 2015-06-09 NOTE — Progress Notes (Signed)
The pt. doesn't need to be seen by Behavioral Medicine. Pt is seeking detox. Per request of ER MD (Lord), writer provided the pt. with information and instructions on how to access Outpatient [place] ([facility], [facility] and [employer]) .  06/09/2015 Con Memos, MS, [place], [facility] Therapeutic Triage Specialist

## 2015-06-09 NOTE — ED Notes (Signed)
Per EMS: left sided cp, onset today at 1545, sharp.  Heroin use yesterday, uses 10 bags a day or so of heroin.  Also consumed a 40 oz beer after vomiting most of the morning.  Denies recent cocaine use.  Took an 81 mg asa today.  12 lead with EMS unremarkable, HR 60 SB, BP: 157/87.  Afebrile.

## 2015-06-09 NOTE — Discharge Instructions (Signed)
You were evaluated for substance abuse, heroin withdrawal, and chest pain.  No certain cause was found for your chest discomfort, but your exam and evaluation are reassuring.  We discussed there is no inpatient facility for heroin withdrawal, and you are being prescribed nausea medicine to help her symptoms. Return to the emergency department for any worsening condition including any symptoms of alcohol withdrawal including tremor, confusion or altered mental status, or seizure, or for any new or worsening chest discomfort, trouble breathing, or any other condition concerning to you.   Opioid Withdrawal Opioids are a group of narcotic drugs. They include the street drug heroin. They also include pain medicines, such as morphine, hydrocodone, oxycodone, and fentanyl. Opioid withdrawal is a group of characteristic physical and mental signs and symptoms. It typically occurs if you have been using opioids daily for several weeks or longer and stop using or rapidly decrease use. Opioid withdrawal can also occur if you have used opioids daily for a long time and are given a medicine to block the effect.  SIGNS AND SYMPTOMS Opioid withdrawal includes three or more of the following symptoms:   Depressed, anxious, or irritable mood.  Nausea or vomiting.  Muscle aches or spasms.   Watery eyes.   Runny nose.  Dilated pupils, sweating, or hairs standing on end.  Diarrhea or intestinal cramping.  Yawning.   Fever.  Increased blood pressure.  Fast pulse.  Restlessness or trouble sleeping. These signs and symptoms occur within several hours of stopping or reducing short-acting opioids, such as heroin. They can occur within 3 days of stopping or reducing long-acting opioids, such as methadone. Withdrawal begins within minutes of receiving a drug that blocks the effects of opioids, such as naltrexone or naloxone. DIAGNOSIS  Opioid use disorder is diagnosed by your health care provider. You will  be asked about your symptoms, drug and alcohol use, medical history, and use of medicines. A physical exam may be done. Lab tests may be ordered. Your health care provider may have you see a mental health professional.  TREATMENT  The treatment for opioid withdrawal is usually provided by medical doctors with special training in substance use disorders (addiction specialists). The following medicines may be included in treatment:  Opioids given in place of the abused opioid. They turn on opioid receptors in the brain and lessen or prevent withdrawal symptoms. They are gradually decreased (opioid substitution and taper).  Non-opioids that can lessen certain opioid withdrawal symptoms. They may be used alone or with opioid substitution and taper. Successful long-term recovery usually requires medicine, counseling, and group support. HOME CARE INSTRUCTIONS   Take medicines only as directed by your health care provider.  Check with your health care provider before starting new medicines.  Keep all follow-up visits as directed by your health care provider. SEEK MEDICAL CARE IF:  You are not able to take your medicines as directed.  Your symptoms get worse.  You relapse. SEEK IMMEDIATE MEDICAL CARE IF:  You have serious thoughts about hurting yourself or others.  You have a seizure.  You lose consciousness.   This information is not intended to replace advice given to you by your health care provider. Make sure you discuss any questions you have with your health care provider.   Document Released: 07/06/2003 Document Revised: 07/24/2014 Document Reviewed: 07/16/2013 Elsevier Interactive Patient Education 2016 Elsevier Inc[employer]    Nonspecific Chest Pain It is often hard to find the cause of chest pain. There is always a chance  that your pain could be related to something serious, such as a heart attack or a blood clot in your lungs. Chest pain can also be caused by conditions that are not  life-threatening. If you have chest pain, it is very important to follow up with your doctor.  HOME CARE  If you were prescribed an antibiotic medicine, finish it all even if you start to feel better.  Avoid any activities that cause chest pain.  Do not use any tobacco products, including cigarettes, chewing tobacco, or electronic cigarettes. If you need help quitting, ask your doctor.  Do not drink alcohol.  Take medicines only as told by your doctor.  Keep all follow-up visits as told by your doctor. This is important. This includes any further testing if your chest pain does not go away.  Your doctor may tell you to keep your head raised (elevated) while you sleep.  Make lifestyle changes as told by your doctor. These may include:  Getting regular exercise. Ask your doctor to suggest some activities that are safe for you.  Eating a heart-healthy diet. Your doctor or a diet specialist (dietitian) can help you to learn healthy eating options.  Maintaining a healthy weight.  Managing diabetes, if necessary.  Reducing stress. GET HELP IF:  Your chest pain does not go away, even after treatment.  You have a rash with blisters on your chest.  You have a fever. GET HELP RIGHT AWAY IF:  Your chest pain is worse.  You have an increasing cough, or you cough up blood.  You have severe belly (abdominal) pain.  You feel extremely weak.  You pass out (faint).  You have chills.  You have sudden, unexplained chest discomfort.  You have sudden, unexplained discomfort in your arms, back, neck, or jaw.  You have shortness of breath at any time.  You suddenly start to sweat, or your skin gets clammy.  You feel nauseous.  You vomit.  You suddenly feel light-headed or dizzy.  Your heart begins to beat quickly, or it feels like it is skipping beats. These symptoms may be an emergency. Do not wait to see if the symptoms will go away. Get medical help right away. Call your  local emergency services (911 in the [place] Do not drive yourself to the hospital.   This information is not intended to replace advice given to you by your health care provider. Make sure you discuss any questions you have with your health care provider.   Document Released: 12/20/2007 Document Revised: 07/24/2014 Document Reviewed: 02/06/2014 Elsevier Interactive Patient Education 2016 [place] Abuse When people abuse more than one drug or type of drug it is called polysubstance or polydrug abuse. For example, many smokers also drink alcohol. This is one form of polydrug abuse. Polydrug abuse also refers to the use of a drug to counteract an unpleasant effect produced by another drug. It may also be used to help with withdrawal from another drug. People who take stimulants may become agitated. Sometimes this agitation is countered with a tranquilizer. This helps protect against the unpleasant side effects. Polydrug abuse also refers to the use of different drugs at the same time.  Anytime drug use is interfering with normal living activities, it has become abuse. This includes problems with family and friends. Psychological dependence has developed when your mind tells you that the drug is needed. This is usually followed by physical dependence which has developed when continuing increases of drug  are required to get the same feeling or "high". This is known as addiction or chemical dependency. A person's risk is much higher if there is a history of chemical dependency in the family. SIGNS OF CHEMICAL DEPENDENCY  You have been told by friends or family that drugs have become a problem.  You fight when using drugs.  You are having blackouts (not remembering what you do while using).  You feel sick from using drugs but continue using.  You lie about use or amounts of drugs (chemicals) used.  You need chemicals to get you going.  You are suffering in work performance  or in school because of drug use.  You get sick from use of drugs but continue to use anyway.  You need drugs to relate to people or feel comfortable in social situations.  You use drugs to forget problems. "Yes" answered to any of the above signs of chemical dependency indicates there are problems. The longer the use of drugs continues, the greater the problems will become. If there is a family history of drug or alcohol use, it is best not to experiment with these drugs. Continual use leads to tolerance. After tolerance develops more of the drug is needed to get the same feeling. This is followed by addiction. With addiction, drugs become the most important part of life. It becomes more important to take drugs than participate in the other usual activities of life. This includes relating to friends and family. Addiction is followed by dependency. Dependency is a condition where drugs are now needed not just to get high, but to feel normal. Addiction cannot be cured but it can be stopped. This often requires outside help and the care of professionals. Treatment centers are listed in the yellow pages under: Cocaine, Narcotics, and Alcoholics Anonymous. Most hospitals and clinics can refer you to a specialized care center. Talk to your caregiver if you need help.   This information is not intended to replace advice given to you by your health care provider. Make sure you discuss any questions you have with your health care provider.   Document Released: 02/22/2005 Document Revised: 09/25/2011 Document Reviewed: 07/08/2014 Elsevier Interactive Patient Education [employer].

## 2017-10-29 ENCOUNTER — Ambulatory Visit: Payer: Self-pay | Admitting: Family Medicine

## 2018-02-13 ENCOUNTER — Other Ambulatory Visit: Payer: Self-pay

## 2018-02-13 ENCOUNTER — Emergency Department
Admission: EM | Admit: 2018-02-13 | Discharge: 2018-02-14 | Disposition: A | Discharge status: Home / Self Care | Payer: Medicare Other | Attending: Emergency Medicine | Admitting: Emergency Medicine

## 2018-02-13 ENCOUNTER — Emergency Department: Payer: Medicare Other

## 2018-02-13 DIAGNOSIS — E876 Hypokalemia: Secondary | ICD-10-CM | POA: Diagnosis not present

## 2018-02-13 DIAGNOSIS — Z79899 Other long term (current) drug therapy: Secondary | ICD-10-CM | POA: Insufficient documentation

## 2018-02-13 DIAGNOSIS — F17210 Nicotine dependence, cigarettes, uncomplicated: Secondary | ICD-10-CM | POA: Insufficient documentation

## 2018-02-13 DIAGNOSIS — R0602 Shortness of breath: Secondary | ICD-10-CM | POA: Diagnosis not present

## 2018-02-13 DIAGNOSIS — M546 Pain in thoracic spine: Secondary | ICD-10-CM | POA: Diagnosis not present

## 2018-02-13 DIAGNOSIS — R700 Elevated erythrocyte sedimentation rate: Secondary | ICD-10-CM | POA: Diagnosis not present

## 2018-02-13 LAB — URINE DRUG SCREEN, QUALITATIVE (ARMC ONLY)
Amphetamines, Ur Screen: NOT DETECTED
BARBITURATES, UR SCREEN: NOT DETECTED
Cannabinoid 50 Ng, Ur ~~LOC~~: NOT DETECTED
Cocaine Metabolite,Ur ~~LOC~~: NOT DETECTED
MDMA (Ecstasy)Ur Screen: NOT DETECTED
Methadone Scn, Ur: NOT DETECTED
Opiate, Ur Screen: NOT DETECTED
Phencyclidine (PCP) Ur S: NOT DETECTED
Tricyclic, Ur Screen: NOT DETECTED

## 2018-02-13 LAB — HEPATIC FUNCTION PANEL
ALT: 10 U/L (ref 0–44)
AST: 39 U/L (ref 15–41)
Albumin: 1.9 g/dL — ABNORMAL LOW (ref 3.5–5.0)
Alkaline Phosphatase: 142 U/L — ABNORMAL HIGH (ref 38–126)
Bilirubin, Direct: 1 mg/dL — ABNORMAL HIGH (ref 0.0–0.2)
Indirect Bilirubin: 1.1 mg/dL — ABNORMAL HIGH (ref 0.3–0.9)
TOTAL PROTEIN: 7.4 g/dL (ref 6.5–8.1)
Total Bilirubin: 2.1 mg/dL — ABNORMAL HIGH (ref 0.3–1.2)

## 2018-02-13 LAB — RAPID HIV SCREEN (HIV 1/2 AB+AG)
HIV 1/2 ANTIBODIES: NONREACTIVE
HIV-1 P24 ANTIGEN - HIV24: NONREACTIVE

## 2018-02-13 LAB — BASIC METABOLIC PANEL
ANION GAP: 6 (ref 5–15)
BUN: 9 mg/dL (ref 8–23)
CALCIUM: 7.7 mg/dL — AB (ref 8.9–10.3)
CO2: 23 mmol/L (ref 22–32)
Chloride: 109 mmol/L (ref 98–111)
Creatinine, Ser: 0.41 mg/dL — ABNORMAL LOW (ref 0.61–1.24)
GLUCOSE: 130 mg/dL — AB (ref 70–99)
POTASSIUM: 2.9 mmol/L — AB (ref 3.5–5.1)
SODIUM: 138 mmol/L (ref 135–145)

## 2018-02-13 LAB — CBC
HCT: 27.2 % — ABNORMAL LOW (ref 40.0–52.0)
Hemoglobin: 9.5 g/dL — ABNORMAL LOW (ref 13.0–18.0)
MCH: 34.2 pg — ABNORMAL HIGH (ref 26.0–34.0)
MCHC: 34.9 g/dL (ref 32.0–36.0)
MCV: 97.8 fL (ref 80.0–100.0)
PLATELETS: 56 10*3/uL — AB (ref 150–440)
RBC: 2.78 MIL/uL — AB (ref 4.40–5.90)
RDW: 20.4 % — ABNORMAL HIGH (ref 11.5–14.5)
WBC: 5.2 10*3/uL (ref 3.8–10.6)

## 2018-02-13 LAB — LACTIC ACID, PLASMA
LACTIC ACID, VENOUS: 1.8 mmol/L (ref 0.5–1.9)
Lactic Acid, Venous: 2.1 mmol/L (ref 0.5–1.9)

## 2018-02-13 LAB — SEDIMENTATION RATE: Sed Rate: >140 mm/hr — ABNORMAL HIGH (ref 0–20)

## 2018-02-13 LAB — TROPONIN I

## 2018-02-13 LAB — ETHANOL: Alcohol, Ethyl (B): 48 mg/dL — ABNORMAL HIGH (ref <10)

## 2018-02-13 LAB — PROTIME-INR
INR: 1.41
PROTHROMBIN TIME: 17.1 s — AB (ref 11.4–15.2)

## 2018-02-13 MED ORDER — SODIUM CHLORIDE 0.9 % IV SOLN
Freq: Once | INTRAVENOUS | Status: AC
  Administered 2018-02-13: 22:00:00 via INTRAVENOUS

## 2018-02-13 MED ORDER — IOHEXOL 300 MG/ML  SOLN
75.0000 mL | Freq: Once | INTRAMUSCULAR | Status: AC | PRN
  Administered 2018-02-13: 75 mL via INTRAVENOUS

## 2018-02-13 MED ORDER — OXYCODONE-ACETAMINOPHEN 5-325 MG PO TABS
2.0000 | ORAL_TABLET | Freq: Once | ORAL | Status: AC
  Administered 2018-02-13: 2 via ORAL
  Filled 2018-02-13: qty 2

## 2018-02-13 NOTE — ED Provider Notes (Signed)
[facility] Emergency Department Provider Note       Time seen: ----------------------------------------- 8:16 PM on 02/13/2018 -----------------------------------------   I have reviewed the triage vital signs and the nursing notes.  HISTORY   Chief Complaint Back Pain    HPI Andrew Gibbs is a 62 y.o. male with a history of hepatitis C and IV drug abuse who presents to the ED for back pain for the past week and a half.  Patient describes as a tightness in his left upper chest and back.  He was noted to be hypotensive on arrival.  Patient denies any dizziness, states last drug use was 4 days ago with a history of IV drug use.  He started Suboxone Saturday and does not want his family to know that he uses drugs.  Patient also has been losing weight, he is not sure how much.  Past Medical History:  Diagnosis Date  . Hepatitis C   . Heroin use     There are no active problems to display for this patient.   Past Surgical History:  Procedure Laterality Date  . KNEE SURGERY      Allergies Patient has no known allergies.  Social History Social History   Tobacco Use  . Smoking status: Current Every Day Smoker  Substance Use Topics  . Alcohol use: Yes    Comment: 40 oz today  . Drug use: Yes    Types: IV, Cocaine    Comment: heroin, states he used to use cocaine   Review of Systems Constitutional: Negative for fever.  Positive for weight loss Cardiovascular: Negative for chest pain. Respiratory: Positive for cough Gastrointestinal: Negative for abdominal pain, vomiting and diarrhea. Musculoskeletal: Positive for back pain Skin: Negative for rash. Neurological: Positive for weakness  All systems negative/normal/unremarkable except as stated in the HPI  ____________________________________________   PHYSICAL EXAM:  VITAL SIGNS: ED Triage Vitals  Enc Vitals Group     BP 02/13/18 1753 (!) 89/56     Pulse Rate 02/13/18 1751 83      Resp 02/13/18 1751 18     Temp 02/13/18 1751 98.3 F (36.8 C)     Temp Source 02/13/18 1751 Oral     SpO2 02/13/18 1751 99 %     Weight 02/13/18 1751 130 lb (59 kg)     Height 02/13/18 1751 5\' 7"  (1.702 m)     Head Circumference --      Peak Flow --      Pain Score 02/13/18 1751 7     Pain Loc --      Pain Edu? --      Excl. in [place]? --    Constitutional: Alert and oriented.  Chronically ill-appearing, no distress Eyes: Conjunctivae are normal. Normal extraocular movements. ENT   Head: Normocephalic and atraumatic.   Nose: No congestion/rhinnorhea.   Mouth/Throat: Mucous membranes are moist.   Neck: No stridor. Cardiovascular: Normal rate, regular rhythm. No murmurs, rubs, or gallops. Respiratory: Normal respiratory effort without tachypnea nor retractions. Breath sounds are clear and equal bilaterally. No wheezes/rales/rhonchi. Gastrointestinal: Soft and nontender. Normal bowel sounds Musculoskeletal: Nontender with normal range of motion in extremities.  Pedal edema is noted Neurologic:  Normal speech and language. No gross focal neurologic deficits are appreciated.  Skin:  Skin is warm, dry and intact. No rash noted. Psychiatric: Mood and affect are normal. Speech and behavior are normal.  ____________________________________________  ED COURSE:  As part of my medical decision making, I reviewed  the following data within the [place] History obtained from family if available, nursing notes, old chart and ekg, as well as notes from prior ED visits. Patient presented for weakness, back pain, cough and IV drug abuse, we will assess with labs and imaging as indicated at this time. Clinical Course as of Feb 14 2315  Wed Feb 13, 2018  2202 Sed Rate(!): >140 [JW]    Clinical Course User Index [JW] Earleen Newport, MD   Procedures ____________________________________________   LABS (pertinent positives/negatives)  Labs Reviewed  CBC -  Abnormal; Notable for the following components:      Result Value   RBC 2.78 (*)    Hemoglobin 9.5 (*)    HCT 27.2 (*)    MCH 34.2 (*)    RDW 20.4 (*)    Platelets 56 (*)    All other components within normal limits  BASIC METABOLIC PANEL - Abnormal; Notable for the following components:   Potassium 2.9 (*)    Glucose, Bld 130 (*)    Creatinine, Ser 0.41 (*)    Calcium 7.7 (*)    All other components within normal limits  LACTIC ACID, PLASMA - Abnormal; Notable for the following components:   Lactic Acid, Venous 2.1 (*)    All other components within normal limits  HEPATIC FUNCTION PANEL - Abnormal; Notable for the following components:   Albumin 1.9 (*)    Alkaline Phosphatase 142 (*)    Total Bilirubin 2.1 (*)    Bilirubin, Direct 1.0 (*)    Indirect Bilirubin 1.1 (*)    All other components within normal limits  ETHANOL - Abnormal; Notable for the following components:   Alcohol, Ethyl (B) 48 (*)    All other components within normal limits  SEDIMENTATION RATE - Abnormal; Notable for the following components:   Sed Rate >140 (*)    All other components within normal limits  PROTIME-INR - Abnormal; Notable for the following components:   Prothrombin Time 17.1 (*)    All other components within normal limits  LACTIC ACID, PLASMA  RAPID HIV SCREEN (HIV 1/2 AB+AG)  URINE DRUG SCREEN, QUALITATIVE ([facility] ONLY)  TROPONIN I   EKG: Interpreted by me, sinus rhythm with extensive artifact, rate is 60 bpm, normal axis, normal QT.  RADIOLOGY Images were viewed by me  Chest x-ray, thoracic spine x-rays IMPRESSION: 1. No acute fracture or dislocation identified. 2. Mild thoracic dextrocurvature with apex at T11. 3. Moderate cervical and mild thoracic spine discogenic degenerative changes. 4. Left lung base linear opacity, likely atelectasis or scarring. IMPRESSION: 1. Negative for pleural effusion, pulmonary infiltrate or pneumothorax. Emphysema with large right apical  blebs. 2. Cirrhotic morphology of the liver with evidence of portal hypertension as evidenced by splenomegaly and probable recanalized paraumbilical vein. 3. Hyperdense 3.5 cm mass within the liver. Additional heterogenous hypodensity within the subcapsular right hepatic lobe. MRI recommended for further evaluation, this could be performed on a nonemergent basis. 4. Small amount of ascites in the upper abdomen  Aortic Atherosclerosis (ICD10-I70.0) and Emphysema (ICD10-J43.9).  ____________________________________________  DIFFERENTIAL DIAGNOSIS   IV drug abuse, osteomyelitis, lung abscess, epidural abscess, hepatitis C, HIV  FINAL ASSESSMENT AND PLAN  Acute back pain   Plan: The patient had presented for back pain. Patient's labs do reveal numerous abnormalities including anemia, thrombocytopenia likely secondary to hepatitis C, mildly elevated lactic acid level and markedly elevated sed rate. Patient's imaging to this point has not revealed any acute abnormality.  MRI  of the cervical and thoracic spine is still pending at this time.   Laurence Aly, MD   Note: This note was generated in part or whole with voice recognition software. Voice recognition is usually quite accurate but there are transcription errors that can and very often do occur. I apologize for any typographical errors that were not detected and corrected.     Earleen Newport, MD 02/13/18 [phone]

## 2018-02-13 NOTE — ED Triage Notes (Addendum)
Pt alert, oriented, ambulatory. States back pain x week and a half. Describes as tightness. Pt is hypotensive in triage, took 3 times on both arms. Denies dizziness. Pt states last drug use 4 days ago. States IV drug use. Started suboxone Saturday per pt. Doesn't want sister to know he does drugs.

## 2018-02-13 NOTE — ED Provider Notes (Signed)
-----------------------------------------   11:59 PM on 02/13/2018 -----------------------------------------   Assuming care from Dr. Jimmye Norman.  In short, Andrew Gibbs is a 62 y.o. male with a chief complaint of upper and mid back pain.  Refer to the original H&P for additional details.  The current plan of care is to follow up MRIs and reassess. I also ordered blood cultures so that we would have them in the system; patient has allegedly threatened to leave AMA several times, and he has the capacity to make his own decisions.    ----------------------------------------- 2:34 AM on 02/14/2018 -----------------------------------------  Patient's MRIs demonstrate no acute abnormalities and no evidence of infection, osteomyelitis, discitis, transverse myelitis, epidural abscess, etc.  There was no finding to explain the patient's pain which is reassuring.  I reviewed his labs and saw that he had a lactic acid of 2.1 which is of unknown significance but likely due to his volume status.  He received IV fluids and I explained that our recommendation is to repeat a lactic acid but that I do not think it is absolutely necessary under the circumstances and he is eager to go home.  I explained that I cannot tell him for sure that that number will come down complete within normal limits without additional testing but he is comfortable with that and I to think that there is no indication to keep him longer.  He has been observed in the emergency department for 9 hours and has been stable the entire time.  I gave my usual customary return precautions and explained to him and his sister the blood cultures were pending and that they would be contacted if any abnormal results from the cultures came back.  They understand and agree.   Hinda Kehr, MD 02/14/18 [phone]

## 2018-02-13 NOTE — ED Notes (Signed)
Williams MD at bedside  

## 2018-02-13 NOTE — ED Notes (Signed)
Pt to and from XR. ABCs intact. NAD. Pt to CT now.

## 2018-02-13 NOTE — ED Notes (Signed)
Pt speaking with MRI at this time

## 2018-02-14 ENCOUNTER — Emergency Department: Payer: Medicare Other

## 2018-02-14 DIAGNOSIS — M546 Pain in thoracic spine: Secondary | ICD-10-CM | POA: Diagnosis not present

## 2018-02-14 MED ORDER — POTASSIUM CHLORIDE CRYS ER 20 MEQ PO TBCR: 20.0000 meq | EXTENDED_RELEASE_TABLET | Freq: Every day | ORAL | 0 refills | Status: DC

## 2018-02-14 MED ORDER — GADOBENATE DIMEGLUMINE 529 MG/ML IV SOLN
15.0000 mL | Freq: Once | INTRAVENOUS | Status: AC | PRN
  Administered 2018-02-14: 12 mL via INTRAVENOUS

## 2018-02-14 NOTE — ED Notes (Signed)
Dr. Karma Greaser and Daiva Nakayama, RN at bedside at this time to update pt on lab/imaging results and plan of care.

## 2018-02-14 NOTE — Discharge Instructions (Signed)
Your workup in the Emergency Department today was reassuring.  We did not find any specific abnormalities other than some general signs of inflammation.  We recommend you drink plenty of fluids, take your regular medications and/or any new ones prescribed today, and follow up with the doctor(s) listed in these documents as recommended.  Return to the Emergency Department if you develop new or worsening symptoms that concern you.

## 2018-02-19 LAB — CULTURE, BLOOD (ROUTINE X 2)
Culture: NO GROWTH
Culture: NO GROWTH

## 2018-04-17 ENCOUNTER — Emergency Department: Payer: Medicare Other

## 2018-04-17 ENCOUNTER — Encounter: Payer: Self-pay | Admitting: Internal Medicine

## 2018-04-17 ENCOUNTER — Other Ambulatory Visit: Payer: Self-pay

## 2018-04-17 ENCOUNTER — Inpatient Hospital Stay
Admission: EM | Admit: 2018-04-17 | Discharge: 2018-05-02 | DRG: 872 | Disposition: A | Discharge status: Home Health | Payer: Medicare Other | Attending: Internal Medicine | Admitting: Internal Medicine

## 2018-04-17 DIAGNOSIS — D649 Anemia, unspecified: Secondary | ICD-10-CM | POA: Diagnosis not present

## 2018-04-17 DIAGNOSIS — F1910 Other psychoactive substance abuse, uncomplicated: Secondary | ICD-10-CM | POA: Diagnosis not present

## 2018-04-17 DIAGNOSIS — Z8781 Personal history of (healed) traumatic fracture: Secondary | ICD-10-CM | POA: Diagnosis not present

## 2018-04-17 DIAGNOSIS — F17200 Nicotine dependence, unspecified, uncomplicated: Secondary | ICD-10-CM | POA: Diagnosis not present

## 2018-04-17 DIAGNOSIS — D638 Anemia in other chronic diseases classified elsewhere: Secondary | ICD-10-CM | POA: Diagnosis present

## 2018-04-17 DIAGNOSIS — M4649 Discitis, unspecified, multiple sites in spine: Secondary | ICD-10-CM | POA: Diagnosis present

## 2018-04-17 DIAGNOSIS — M4643 Discitis, unspecified, cervicothoracic region: Secondary | ICD-10-CM | POA: Diagnosis not present

## 2018-04-17 DIAGNOSIS — I8500 Esophageal varices without bleeding: Secondary | ICD-10-CM | POA: Diagnosis present

## 2018-04-17 DIAGNOSIS — L02413 Cutaneous abscess of right upper limb: Secondary | ICD-10-CM | POA: Diagnosis present

## 2018-04-17 DIAGNOSIS — B3789 Other sites of candidiasis: Secondary | ICD-10-CM | POA: Diagnosis not present

## 2018-04-17 DIAGNOSIS — B379 Candidiasis, unspecified: Secondary | ICD-10-CM | POA: Diagnosis present

## 2018-04-17 DIAGNOSIS — K766 Portal hypertension: Secondary | ICD-10-CM | POA: Diagnosis present

## 2018-04-17 DIAGNOSIS — C229 Malignant neoplasm of liver, not specified as primary or secondary: Secondary | ICD-10-CM | POA: Diagnosis not present

## 2018-04-17 DIAGNOSIS — S27818A Other injury of esophagus (thoracic part), initial encounter: Secondary | ICD-10-CM

## 2018-04-17 DIAGNOSIS — H524 Presbyopia: Secondary | ICD-10-CM | POA: Diagnosis present

## 2018-04-17 DIAGNOSIS — K7460 Unspecified cirrhosis of liver: Secondary | ICD-10-CM | POA: Diagnosis present

## 2018-04-17 DIAGNOSIS — B182 Chronic viral hepatitis C: Secondary | ICD-10-CM

## 2018-04-17 DIAGNOSIS — Z8619 Personal history of other infectious and parasitic diseases: Secondary | ICD-10-CM | POA: Diagnosis not present

## 2018-04-17 DIAGNOSIS — K0889 Other specified disorders of teeth and supporting structures: Secondary | ICD-10-CM | POA: Diagnosis not present

## 2018-04-17 DIAGNOSIS — F17210 Nicotine dependence, cigarettes, uncomplicated: Secondary | ICD-10-CM | POA: Diagnosis present

## 2018-04-17 DIAGNOSIS — C220 Liver cell carcinoma: Secondary | ICD-10-CM | POA: Diagnosis present

## 2018-04-17 DIAGNOSIS — G8929 Other chronic pain: Secondary | ICD-10-CM | POA: Diagnosis present

## 2018-04-17 DIAGNOSIS — M4623 Osteomyelitis of vertebra, cervicothoracic region: Secondary | ICD-10-CM | POA: Diagnosis present

## 2018-04-17 DIAGNOSIS — M549 Dorsalgia, unspecified: Secondary | ICD-10-CM | POA: Diagnosis not present

## 2018-04-17 DIAGNOSIS — R7881 Bacteremia: Secondary | ICD-10-CM | POA: Diagnosis not present

## 2018-04-17 DIAGNOSIS — R509 Fever, unspecified: Secondary | ICD-10-CM | POA: Diagnosis not present

## 2018-04-17 DIAGNOSIS — B9689 Other specified bacterial agents as the cause of diseases classified elsewhere: Secondary | ICD-10-CM | POA: Diagnosis not present

## 2018-04-17 DIAGNOSIS — Z9889 Other specified postprocedural states: Secondary | ICD-10-CM | POA: Diagnosis not present

## 2018-04-17 DIAGNOSIS — F1110 Opioid abuse, uncomplicated: Secondary | ICD-10-CM | POA: Diagnosis present

## 2018-04-17 DIAGNOSIS — B1920 Unspecified viral hepatitis C without hepatic coma: Secondary | ICD-10-CM | POA: Diagnosis not present

## 2018-04-17 DIAGNOSIS — L089 Local infection of the skin and subcutaneous tissue, unspecified: Secondary | ICD-10-CM | POA: Diagnosis not present

## 2018-04-17 DIAGNOSIS — F1190 Opioid use, unspecified, uncomplicated: Secondary | ICD-10-CM | POA: Diagnosis not present

## 2018-04-17 DIAGNOSIS — E440 Moderate protein-calorie malnutrition: Secondary | ICD-10-CM

## 2018-04-17 DIAGNOSIS — D61818 Other pancytopenia: Secondary | ICD-10-CM | POA: Diagnosis present

## 2018-04-17 DIAGNOSIS — A4150 Gram-negative sepsis, unspecified: Principal | ICD-10-CM | POA: Diagnosis present

## 2018-04-17 DIAGNOSIS — Z6820 Body mass index (BMI) 20.0-20.9, adult: Secondary | ICD-10-CM | POA: Diagnosis not present

## 2018-04-17 DIAGNOSIS — B372 Candidiasis of skin and nail: Secondary | ICD-10-CM | POA: Diagnosis not present

## 2018-04-17 DIAGNOSIS — B9561 Methicillin susceptible Staphylococcus aureus infection as the cause of diseases classified elsewhere: Secondary | ICD-10-CM | POA: Diagnosis not present

## 2018-04-17 DIAGNOSIS — Z967 Presence of other bone and tendon implants: Secondary | ICD-10-CM | POA: Diagnosis not present

## 2018-04-17 DIAGNOSIS — I358 Other nonrheumatic aortic valve disorders: Secondary | ICD-10-CM | POA: Diagnosis present

## 2018-04-17 DIAGNOSIS — H269 Unspecified cataract: Secondary | ICD-10-CM | POA: Diagnosis present

## 2018-04-17 DIAGNOSIS — A419 Sepsis, unspecified organism: Secondary | ICD-10-CM

## 2018-04-17 LAB — COMPREHENSIVE METABOLIC PANEL
ALK PHOS: 142 U/L — AB (ref 38–126)
ALT: 9 U/L (ref 0–44)
AST: 36 U/L (ref 15–41)
Albumin: 1.8 g/dL — ABNORMAL LOW (ref 3.5–5.0)
Anion gap: 9 (ref 5–15)
BUN: 9 mg/dL (ref 8–23)
CALCIUM: 7.8 mg/dL — AB (ref 8.9–10.3)
CO2: 21 mmol/L — ABNORMAL LOW (ref 22–32)
Chloride: 109 mmol/L (ref 98–111)
Creatinine, Ser: 0.68 mg/dL (ref 0.61–1.24)
GFR calc Af Amer: >60 mL/min (ref >60)
Glucose, Bld: 105 mg/dL — ABNORMAL HIGH (ref 70–99)
POTASSIUM: 3.6 mmol/L (ref 3.5–5.1)
Sodium: 139 mmol/L (ref 135–145)
TOTAL PROTEIN: 7.3 g/dL (ref 6.5–8.1)
Total Bilirubin: 1.5 mg/dL — ABNORMAL HIGH (ref 0.3–1.2)

## 2018-04-17 LAB — URINALYSIS, COMPLETE (UACMP) WITH MICROSCOPIC
BILIRUBIN URINE: NEGATIVE
Bacteria, UA: NONE SEEN
Glucose, UA: NEGATIVE mg/dL
Hgb urine dipstick: NEGATIVE
KETONES UR: NEGATIVE mg/dL
Leukocytes, UA: NEGATIVE
Nitrite: NEGATIVE
PH: 6 (ref 5.0–8.0)
Protein, ur: NEGATIVE mg/dL
SPECIFIC GRAVITY, URINE: 1.013 (ref 1.005–1.030)

## 2018-04-17 LAB — IRON AND TIBC
IRON: 48 ug/dL (ref 45–182)
Saturation Ratios: 26 % (ref 17.9–39.5)
TIBC: 187 ug/dL — ABNORMAL LOW (ref 250–450)
UIBC: 139 ug/dL

## 2018-04-17 LAB — CBC WITH DIFFERENTIAL/PLATELET
Basophils Absolute: 0 10*3/uL (ref 0–0.1)
Basophils Relative: 0 %
EOS ABS: 0 10*3/uL (ref 0–0.7)
Eosinophils Relative: 1 %
HCT: 18.6 % — ABNORMAL LOW (ref 40.0–52.0)
HEMOGLOBIN: 6.3 g/dL — AB (ref 13.0–18.0)
Lymphocytes Relative: 10 %
Lymphs Abs: 0.4 10*3/uL — ABNORMAL LOW (ref 1.0–3.6)
MCH: 36.1 pg — ABNORMAL HIGH (ref 26.0–34.0)
MCHC: 34.1 g/dL (ref 32.0–36.0)
MCV: 105.9 fL — ABNORMAL HIGH (ref 80.0–100.0)
Monocytes Absolute: 0 10*3/uL — ABNORMAL LOW (ref 0.2–1.0)
Monocytes Relative: 1 %
NEUTROS PCT: 88 %
Neutro Abs: 3.2 10*3/uL (ref 1.4–6.5)
Platelets: 56 10*3/uL — ABNORMAL LOW (ref 150–440)
RBC: 1.76 MIL/uL — ABNORMAL LOW (ref 4.40–5.90)
RDW: 18.1 % — ABNORMAL HIGH (ref 11.5–14.5)
WBC: 3.7 10*3/uL — ABNORMAL LOW (ref 3.8–10.6)

## 2018-04-17 LAB — PREPARE RBC (CROSSMATCH)

## 2018-04-17 LAB — VITAMIN B12: VITAMIN B 12: 771 pg/mL (ref 180–914)

## 2018-04-17 LAB — PROTIME-INR
INR: 1.54
Prothrombin Time: 18.4 seconds — ABNORMAL HIGH (ref 11.4–15.2)

## 2018-04-17 LAB — LIPASE, BLOOD: LIPASE: 40 U/L (ref 11–51)

## 2018-04-17 LAB — FERRITIN: FERRITIN: 312 ng/mL (ref 24–336)

## 2018-04-17 LAB — LACTIC ACID, PLASMA
Lactic Acid, Venous: 4.1 mmol/L (ref 0.5–1.9)
Lactic Acid, Venous: 2.7 mmol/L (ref 0.5–1.9)

## 2018-04-17 LAB — ABO/RH: ABO/RH(D): O POS

## 2018-04-17 LAB — APTT: aPTT: 37 seconds — ABNORMAL HIGH (ref 24–36)

## 2018-04-17 LAB — TROPONIN I: Troponin I: <0.03 ng/mL (ref <0.03)

## 2018-04-17 LAB — PROCALCITONIN: PROCALCITONIN: 0.2 ng/mL

## 2018-04-17 MED ORDER — OXYCODONE HCL 5 MG PO TABS
5.0000 mg | ORAL_TABLET | ORAL | Status: DC | PRN
  Administered 2018-04-17 – 2018-05-02 (×53): 5 mg via ORAL
  Filled 2018-04-17 (×54): qty 1

## 2018-04-17 MED ORDER — ONDANSETRON HCL 4 MG PO TABS: 4.0000 mg | ORAL_TABLET | Freq: Four times a day (QID) | ORAL | Status: DC | PRN

## 2018-04-17 MED ORDER — VANCOMYCIN HCL IN DEXTROSE 750-5 MG/150ML-% IV SOLN
750.0000 mg | Freq: Two times a day (BID) | INTRAVENOUS | Status: DC
  Administered 2018-04-17 – 2018-04-19 (×4): 750 mg via INTRAVENOUS
  Filled 2018-04-17 (×7): qty 150

## 2018-04-17 MED ORDER — KETOROLAC TROMETHAMINE 30 MG/ML IJ SOLN
30.0000 mg | Freq: Four times a day (QID) | INTRAMUSCULAR | Status: AC | PRN
  Administered 2018-04-17 – 2018-04-22 (×9): 30 mg via INTRAVENOUS
  Filled 2018-04-17 (×10): qty 1

## 2018-04-17 MED ORDER — SODIUM CHLORIDE 0.9 % IV BOLUS (SEPSIS)
250.0000 mL | Freq: Once | INTRAVENOUS | Status: AC
  Administered 2018-04-17: 250 mL via INTRAVENOUS

## 2018-04-17 MED ORDER — ONDANSETRON HCL 4 MG/2ML IJ SOLN
4.0000 mg | Freq: Four times a day (QID) | INTRAMUSCULAR | Status: DC | PRN
  Administered 2018-04-18: 4 mg via INTRAVENOUS
  Filled 2018-04-17: qty 2

## 2018-04-17 MED ORDER — SODIUM CHLORIDE 0.9 % IV BOLUS (SEPSIS)
500.0000 mL | Freq: Once | INTRAVENOUS | Status: AC
  Administered 2018-04-17: 500 mL via INTRAVENOUS

## 2018-04-17 MED ORDER — METRONIDAZOLE IN NACL 5-0.79 MG/ML-% IV SOLN
500.0000 mg | Freq: Three times a day (TID) | INTRAVENOUS | Status: DC
  Administered 2018-04-17 – 2018-04-18 (×4): 500 mg via INTRAVENOUS
  Filled 2018-04-17 (×5): qty 100

## 2018-04-17 MED ORDER — ALBUTEROL SULFATE (2.5 MG/3ML) 0.083% IN NEBU: 2.5000 mg | INHALATION_SOLUTION | RESPIRATORY_TRACT | Status: DC | PRN

## 2018-04-17 MED ORDER — POTASSIUM CHLORIDE IN NACL 20-0.9 MEQ/L-% IV SOLN
INTRAVENOUS | Status: DC
  Administered 2018-04-17 – 2018-04-27 (×19): via INTRAVENOUS
  Filled 2018-04-17 (×26): qty 1000

## 2018-04-17 MED ORDER — VANCOMYCIN HCL IN DEXTROSE 1-5 GM/200ML-% IV SOLN
1000.0000 mg | Freq: Once | INTRAVENOUS | Status: AC
  Administered 2018-04-17: 1000 mg via INTRAVENOUS
  Filled 2018-04-17: qty 200

## 2018-04-17 MED ORDER — SODIUM CHLORIDE 0.9% IV SOLUTION
Freq: Once | INTRAVENOUS | Status: AC
  Administered 2018-04-17: 18:00:00 via INTRAVENOUS
  Filled 2018-04-17: qty 250

## 2018-04-17 MED ORDER — SODIUM CHLORIDE 0.9 % IV BOLUS
250.0000 mL | Freq: Once | INTRAVENOUS | Status: AC
  Administered 2018-04-17: 250 mL via INTRAVENOUS

## 2018-04-17 MED ORDER — SODIUM CHLORIDE 0.9 % IV SOLN
2.0000 g | Freq: Once | INTRAVENOUS | Status: AC
  Administered 2018-04-17: 2 g via INTRAVENOUS
  Filled 2018-04-17: qty 2

## 2018-04-17 MED ORDER — POLYETHYLENE GLYCOL 3350 17 G PO PACK: 17.0000 g | PACK | Freq: Every day | ORAL | Status: DC | PRN

## 2018-04-17 MED ORDER — SODIUM CHLORIDE 0.9 % IV BOLUS (SEPSIS)
1000.0000 mL | Freq: Once | INTRAVENOUS | Status: AC
  Administered 2018-04-17: 1000 mL via INTRAVENOUS

## 2018-04-17 MED ORDER — DOCUSATE SODIUM 100 MG PO CAPS
100.0000 mg | ORAL_CAPSULE | Freq: Two times a day (BID) | ORAL | Status: DC
  Administered 2018-04-18 – 2018-05-02 (×17): 100 mg via ORAL
  Filled 2018-04-17 (×25): qty 1

## 2018-04-17 MED ORDER — ACETAMINOPHEN 325 MG PO TABS
650.0000 mg | ORAL_TABLET | Freq: Four times a day (QID) | ORAL | Status: DC | PRN
  Administered 2018-04-23 – 2018-04-29 (×9): 650 mg via ORAL
  Filled 2018-04-17 (×9): qty 2

## 2018-04-17 MED ORDER — ACETAMINOPHEN 650 MG RE SUPP: 650.0000 mg | Freq: Four times a day (QID) | RECTAL | Status: DC | PRN

## 2018-04-17 MED ORDER — SODIUM CHLORIDE 0.9 % IV SOLN
2.0000 g | Freq: Three times a day (TID) | INTRAVENOUS | Status: DC
  Administered 2018-04-17 – 2018-04-19 (×5): 2 g via INTRAVENOUS
  Filled 2018-04-17 (×7): qty 2

## 2018-04-17 NOTE — ED Notes (Signed)
First nurse note: this RN spoke to pts brother who wanted to go back to see his brother. I explained process that when pt refuses any visitors, we must adhere to pt request. 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 I stated I would let pts RN know, encouraged the brother to head home, brother appreciated my time and information. I did tell the brother I was the Surveyor, quantity to which he replied, "I'm not here for any trouble." Pts brother left on good terms with ED lobby staff.

## 2018-04-17 NOTE — Consult Note (Signed)
Pharmacy Antibiotic Note  Andrew Gibbs is a 62 y.o. male admitted on 04/17/2018 with sepsis.  Pharmacy has been consulted for cefepime and vancomycin dosing.  Plan: Vancomycin 1000 mg IV once in ED followed by 8 hour stacked dose of Vancomycin 750 IV every 12 hours.  Goal trough 15-20 mcg/mL.  Will drawn vancomycin trough 30 minutes prior to the fifth dose.  Cefepime 2 gm IV every 8 hours.  Height: 5\' 7"  (170.2 cm) Weight: 120 lb (54.4 kg) IBW/kg (Calculated) : 66.1  Temp (24hrs), Avg:100.2 F (37.9 C), Min:100.2 F (37.9 C), Max:100.2 F (37.9 C)  Recent Labs  Lab 04/17/18 1233  WBC 3.7*  CREATININE 0.68  LATICACIDVEN 4.1*    Estimated Creatinine Clearance: 73.7 mL/min (by C-G formula based on SCr of 0.68 mg/dL).    No Known Allergies  Antimicrobials this admission: Cefepime 10/02 >>  Vancomycin 10/02 >>  Metronidazole 10/02 >>  Dose adjustments this admission:   Microbiology results: 10/02 BCx:   UCx:    Sputum:    MRSA PCR:  10/02 WoundCx:   Thank you for allowing pharmacy to be a part of this patient's care.  Forrest Moron 04/17/2018 1:59 PM

## 2018-04-17 NOTE — ED Notes (Addendum)
First nurse note: Pt here via EMS with c/o wanting help, did heroin at 0400 and alcohol "some beers" 148/80, HR 100, glucose 108, appears in NAD. Pt did tell EMS he needed to be home by tomorrow to pay his light bill. Pt requesting not to be around his family. Family sitting on other side of ED at this time-unaware pt had been brought in.

## 2018-04-17 NOTE — ED Notes (Signed)
Report to Joy, RN

## 2018-04-17 NOTE — Progress Notes (Signed)
CODE SEPSIS - PHARMACY COMMUNICATION  **Broad Spectrum Antibiotics should be administered within 1 hour of Sepsis diagnosis**  Time Code Sepsis Called/Page Received: 1224  Antibiotics Ordered: Cefepime, vancomycin, metronidazole @ 1220  Time of 1st antibiotic administration: 1316  Additional action taken by pharmacy: Followed up with nurse at 1314 regarding administration of antibiotic therapy.  Nurse stated they were working on getting the medications started.  If necessary, Name of Provider/Nurse Contacted: Tama Gander ,PharmD Clinical Pharmacist  04/17/2018  12:25 PM

## 2018-04-17 NOTE — H&P (Signed)
[place] [facility] [place] NAME: Andrew Gibbs    MR#:  [id number]  DATE OF BIRTH:  [birth year]  DATE OF ADMISSION:  04/17/2018  PRIMARY CARE PHYSICIAN: Patient, No Pcp Per   REQUESTING/REFERRING PHYSICIAN: Dr. Joni Fears  CHIEF COMPLAINT:   Chief Complaint  Patient presents with  . Abdominal Pain    HISTORY OF PRESENT ILLNESS:  Andrew Gibbs  is a 62 y.o. male with a known history of hepatitis C, IV drug use, chronic thrombocytopenia and anemia of chronic disease presents to the emergency room complaining of some low back pain and right upper extremity pain.  Patient has a draining ulcer on his right arm but swelling and redness around it.  Lactic acid elevated at 4.1.  Leukopenia. He had a fall 4 weeks back and since then has had back pain for which she has had x-rays and MRI with nothing acute found.  No change in his pain. Blood culture sent.  Started on vancomycin and cefepime for his right upper extremity abscess and is being admitted to the hospital.  PAST MEDICAL HISTORY:   Past Medical History:  Diagnosis Date  . Hepatitis C   . Heroin use     PAST SURGICAL HISTORY:   Past Surgical History:  Procedure Laterality Date  . KNEE SURGERY      SOCIAL HISTORY:   Social History   Tobacco Use  . Smoking status: Current Every Day Smoker  Substance Use Topics  . Alcohol use: Yes    Comment: 40 oz today    FAMILY HISTORY:  No family history on file. No CAD  DRUG ALLERGIES:  No Known Allergies  REVIEW OF SYSTEMS:   Review of Systems  Constitutional: Positive for chills and malaise/fatigue. Negative for fever and weight loss.  HENT: Negative for hearing loss and nosebleeds.   Eyes: Negative for blurred vision, double vision and pain.  Respiratory: Negative for cough, hemoptysis, sputum production, shortness of breath and wheezing.   Cardiovascular: Negative for chest pain, palpitations, orthopnea and leg swelling.   Gastrointestinal: Negative for abdominal pain, constipation, diarrhea, nausea and vomiting.  Genitourinary: Negative for dysuria and hematuria.  Musculoskeletal: Positive for back pain. Negative for falls and myalgias.  Skin: Negative for rash.  Neurological: Negative for dizziness, tremors, sensory change, speech change, focal weakness, seizures and headaches.  Endo/Heme/Allergies: Does not bruise/bleed easily.  Psychiatric/Behavioral: Negative for depression and memory loss. The patient is not nervous/anxious.    MEDICATIONS AT HOME:   Prior to Admission medications   Medication Sig Start Date End Date Taking? Authorizing Provider  ibuprofen (ADVIL,MOTRIN) 200 MG tablet Take 800-1,000 mg by mouth as needed for moderate pain.   Yes [provider]  potassium chloride SA (KLOR-CON M20) 20 MEQ tablet Take 1 tablet (20 mEq total) by mouth daily. Patient not taking: Reported on 04/17/2018 02/14/18   Hinda Kehr, MD     VITAL SIGNS:  Blood pressure 118/72, pulse 86, temperature 100.2 F (37.9 C), temperature source Oral, resp. rate (!) 21, height 5\' 7"  (1.702 m), weight 54.4 kg, SpO2 98 %.  PHYSICAL EXAMINATION:  Physical Exam  GENERAL:  62 y.o.-year-old patient lying in the bed with no acute distress.  EYES: Pupils equal, round, reactive to light and accommodation. No scleral icterus. Extraocular muscles intact.  HEENT: Head atraumatic, normocephalic. Oropharynx and nasopharynx clear. No oropharyngeal erythema, moist oral mucosa  NECK:  Supple, no jugular venous distention. No thyroid enlargement, no tenderness.  LUNGS: Normal  breath sounds bilaterally, no wheezing, rales, rhonchi. No use of accessory muscles of respiration.  CARDIOVASCULAR: S1, S2 normal. No murmurs, rubs, or gallops.  ABDOMEN: Soft, nontender, nondistended. Bowel sounds present. No organomegaly or mass.  EXTREMITIES: No pedal edema, cyanosis, or clubbing. + 2 pedal & radial pulses b/l.   NEUROLOGIC: Cranial  nerves II through XII are intact. No focal Motor or sensory deficits appreciated b/l PSYCHIATRIC: The patient is alert and oriented x 3. Good affect.  SKIN: Right arm redness and swelling with small area draining pus  LABORATORY PANEL:   CBC Recent Labs  Lab 04/17/18 1233  WBC 3.7*  HGB 6.3*  HCT 18.6*  PLT 56*   ------------------------------------------------------------------------------------------------------------------  Chemistries  Recent Labs  Lab 04/17/18 1233  NA 139  K 3.6  CL 109  CO2 21*  GLUCOSE 105*  BUN 9  CREATININE 0.68  CALCIUM 7.8*  AST 36  ALT 9  ALKPHOS 142*  BILITOT 1.5*   ------------------------------------------------------------------------------------------------------------------  Cardiac Enzymes Recent Labs  Lab 04/17/18 1233  TROPONINI <0.03   ------------------------------------------------------------------------------------------------------------------  RADIOLOGY:  Dg Chest Port 1 View  Result Date: 04/17/2018 CLINICAL DATA:  Acute lower abdominal pain. EXAM: PORTABLE CHEST 1 VIEW COMPARISON:  Radiographs of February 13, 2018. FINDINGS: The heart size and mediastinal contours are within normal limits. Both lungs are clear. No pneumothorax or pleural effusion is noted. The visualized skeletal structures are unremarkable. IMPRESSION: No acute cardiopulmonary abnormality seen. Electronically Signed   By: Marijo Conception, M.D.   On: 04/17/2018 12:47     IMPRESSION AND PLAN:   *Right upper extremity abscess secondary to IV drug use.  Patient on vancomycin and cefepime.  Blood cultures and wound cultures sent.  IV fluid resuscitation for sepsis present on admission We will consult surgery.  Sent message to Dr. Rosana Hoes who is on call.  *Chronic thrombocytopenia stable.  No bleeding  *Chronic back pain.  Recent MRI showed nothing acute.  *Anemia of chronic disease.  Hemoccult stool negative.  Check iron studies, B12.  Consent obtained  for blood transfusion.  Transfuse 1 unit packed RBC.  All the records are reviewed and case discussed with ED provider. Management plans discussed with the patient, family and they are in agreement.  CODE STATUS: FULL CODE  TOTAL TIME TAKING CARE OF THIS PATIENT: 45 minutes.   Andrew Gibbs M.D on 04/17/2018 at 2:52 PM  Between 7am to 6pm - Pager - [phone]  After 6pm go to [email or web address] - password [email or web address] [facility]  Office  [phone]  CC: Primary care physician; Patient, No Pcp Per  Note: This dictation was prepared with Dragon dictation along with smaller phrase technology. Any transcriptional errors that result from this process are unintentional.

## 2018-04-17 NOTE — ED Triage Notes (Signed)
Pt states he began having lower abdominal pain today.  Also having back pain.  Injected Heroin today and IV had dirt in it which caused him to start shaking.

## 2018-04-17 NOTE — ED Provider Notes (Signed)
[facility] Emergency Department Provider Note  ____________________________________________  Time seen: Approximately 2:25 PM  I have reviewed the triage vital signs and the nursing notes.   HISTORY  Chief Complaint Abdominal Pain    HPI Andrew Gibbs is a 62 y.o. male with a history of hepatitis C and daily IV heroin use who complains of pain and swelling of the right upper arm, gradual onset a few weeks ago, continuously worsening.  Nonradiating.  No aggravating or alleviating factors.  Does admit some recent drinking.  Denies other drug use.  No intent to harm himself.  Denies chest pain or shortness of breath but does report some intermittent lower abdominal pain and back pain.      Past Medical History:  Diagnosis Date  . Hepatitis C   . Heroin use      There are no active problems to display for this patient.    Past Surgical History:  Procedure Laterality Date  . KNEE SURGERY       Prior to Admission medications   Medication Sig Start Date End Date Taking? Authorizing Provider  potassium chloride SA (KLOR-CON M20) 20 MEQ tablet Take 1 tablet (20 mEq total) by mouth daily. 02/14/18   Hinda Kehr, MD     Allergies Patient has no known allergies.   No family history on file.  Social History Social History   Tobacco Use  . Smoking status: Current Every Day Smoker  Substance Use Topics  . Alcohol use: Yes    Comment: 40 oz today  . Drug use: Yes    Types: IV, Cocaine    Comment: heroin, states he used to use cocaine    Review of Systems  Constitutional: Positive fever and chills since yesterday ENT:   No sore throat. No rhinorrhea. Cardiovascular:   No chest pain or syncope. Respiratory:   No dyspnea or cough. Gastrointestinal:   Positive lower abdominal pain without vomiting and diarrhea.  Musculoskeletal: Positive low back pain All other systems reviewed and are negative except as documented above in ROS and  HPI.  ____________________________________________   PHYSICAL EXAM:  VITAL SIGNS: ED Triage Vitals  Enc Vitals Group     BP 04/17/18 1151 (!) 102/50     Pulse Rate 04/17/18 1151 86     Resp 04/17/18 1400 16     Temp 04/17/18 1151 100.2 F (37.9 C)     Temp Source 04/17/18 1151 Oral     SpO2 04/17/18 1151 98 %     Weight 04/17/18 1152 120 lb (54.4 kg)     Height 04/17/18 1152 5\' 7"  (1.702 m)     Head Circumference --      Peak Flow --      Pain Score 04/17/18 1152 0     Pain Loc --      Pain Edu? --      Excl. in [place]? --     Vital signs reviewed, nursing assessments reviewed.   Constitutional:   Alert and oriented.  Ill-appearing Eyes:   Conjunctivae are normal. EOMI. PERRL. ENT      Head:   Normocephalic and atraumatic.      Nose:   No congestion/rhinnorhea.       Mouth/Throat:   Dry mucous membranes, no pharyngeal erythema. No peritonsillar mass.       Neck:   No meningismus. Full ROM. Hematological/Lymphatic/Immunilogical:   No cervical lymphadenopathy. Cardiovascular:   RRR. Symmetric bilateral radial and DP pulses.  No murmurs.  Cap refill less than 2 seconds.  No embolic phenomena respiratory:   Normal respiratory effort without tachypnea/retractions. Breath sounds are clear and equal bilaterally. No wheezes/rales/rhonchi. Gastrointestinal:   Soft and nontender. Non distended. There is no CVA tenderness.  No rebound, rigidity, or guarding.  Musculoskeletal:   Normal range of motion in all extremities. No joint effusions.  No lower extremity tenderness.  Diffuse swelling tenderness and induration of the right arm from the mid forearm up through the proximal humerus area.  There is a small skin opening with spontaneous purulent drainage.  I expressed this area and was able to drain a moderate amount of pus.  No crepitus. Neurologic:   Normal speech and language.  Motor grossly intact. No acute focal neurologic deficits are appreciated.  Skin:    Skin is warm, dry with  inflammatory skin changes of the right arm as above.  Also swelling of the left forearm without fluctuance induration or crepitus.   No petechiae, purpura, or bullae.  ____________________________________________    LABS (pertinent positives/negatives) (all labs ordered are listed, but only abnormal results are displayed) Labs Reviewed  LACTIC ACID, PLASMA - Abnormal; Notable for the following components:      Result Value   Lactic Acid, Venous 4.1 (*)    All other components within normal limits  COMPREHENSIVE METABOLIC PANEL - Abnormal; Notable for the following components:   CO2 21 (*)    Glucose, Bld 105 (*)    Calcium 7.8 (*)    Albumin 1.8 (*)    Alkaline Phosphatase 142 (*)    Total Bilirubin 1.5 (*)    All other components within normal limits  CBC WITH DIFFERENTIAL/PLATELET - Abnormal; Notable for the following components:   WBC 3.7 (*)    RBC 1.76 (*)    Hemoglobin 6.3 (*)    HCT 18.6 (*)    MCV 105.9 (*)    MCH 36.1 (*)    RDW 18.1 (*)    Platelets 56 (*)    Lymphs Abs 0.4 (*)    Monocytes Absolute 0.0 (*)    All other components within normal limits  APTT - Abnormal; Notable for the following components:   aPTT 37 (*)    All other components within normal limits  PROTIME-INR - Abnormal; Notable for the following components:   Prothrombin Time 18.4 (*)    All other components within normal limits  CULTURE, BLOOD (ROUTINE X 2)  CULTURE, BLOOD (ROUTINE X 2)  URINE CULTURE  AEROBIC CULTURE (SUPERFICIAL SPECIMEN)  LIPASE, BLOOD  TROPONIN I  PROCALCITONIN  LACTIC ACID, PLASMA  URINALYSIS, COMPLETE (UACMP) WITH MICROSCOPIC  HIV ANTIBODY (ROUTINE TESTING W REFLEX)   ____________________________________________   EKG    ____________________________________________    RADIOLOGY  Dg Chest Port 1 View  Result Date: 04/17/2018 CLINICAL DATA:  Acute lower abdominal pain. EXAM: PORTABLE CHEST 1 VIEW COMPARISON:  Radiographs of February 13, 2018. FINDINGS: The  heart size and mediastinal contours are within normal limits. Both lungs are clear. No pneumothorax or pleural effusion is noted. The visualized skeletal structures are unremarkable. IMPRESSION: No acute cardiopulmonary abnormality seen. Electronically Signed   By: Marijo Conception, M.D.   On: 04/17/2018 12:47    ____________________________________________   PROCEDURES .Critical Care Performed by: Carrie Mew, MD Authorized by: Carrie Mew, MD   Critical care provider statement:    Critical care time (minutes):  35   Critical care time was exclusive of:  Separately billable procedures and treating other patients  Critical care was necessary to treat or prevent imminent or life-threatening deterioration of the following conditions:  Sepsis   Critical care was time spent personally by me on the following activities:  Development of treatment plan with patient or surrogate, discussions with consultants, evaluation of patient's response to treatment, examination of patient, obtaining history from patient or surrogate, ordering and performing treatments and interventions, ordering and review of laboratory studies, ordering and review of radiographic studies, pulse oximetry, re-evaluation of patient's condition and review of old charts    ____________________________________________    CLINICAL IMPRESSION / [place] / ED COURSE  Pertinent labs & imaging results that were available during my care of the patient were reviewed by me and considered in my medical decision making (see chart for details).    Patient presents with fever and tachycardia in the setting of IV drug use.  He often does subcutaneous injection and has evidence of abscess in the area.  This explains his infectious source, but he is at elevated risk for occult infection as well.  Sepsis work-up, blood cultures, wound culture.  Broad-spectrum antibiotics with cefepime and vancomycin.  I doubt epidural  abscess or spinal osteomyelitis.  Clinical Course as of Apr 17 1424  Wed Apr 17, 2018  1336 Lactate 4.1. Will start ivf 30 ml/kg bolus   [PS]    Clinical Course User Index [PS] Carrie Mew, MD    ----------------------------------------- 2:31 PM on 04/17/2018 -----------------------------------------  Case discussed with hospitalist for admission.  Labs and chest x-ray overall unremarkable.  He does have hemoglobin of 6.3, white blood cell count of 3.7, platelets of 56.  Added on a HIV screen.  Nothing his presentation is consistent with acute hemorrhagic shock.  No evidence of bleeding source such as GI bleed.  ____________________________________________   FINAL CLINICAL IMPRESSION(S) / ED DIAGNOSES    Final diagnoses:  Sepsis, due to unspecified organism, unspecified whether acute organ dysfunction present ([place])  Abscess of right upper arm and forearm  IV drug abuse ([place])  Chronic hepatitis C without hepatic coma [facility])     ED Discharge Orders    None      Portions of this note were generated with dragon dictation software. Dictation errors may occur despite best attempts at proofreading.    Carrie Mew, MD 04/17/18 [phone]

## 2018-04-18 DIAGNOSIS — F1190 Opioid use, unspecified, uncomplicated: Secondary | ICD-10-CM

## 2018-04-18 DIAGNOSIS — Z8619 Personal history of other infectious and parasitic diseases: Secondary | ICD-10-CM

## 2018-04-18 DIAGNOSIS — L089 Local infection of the skin and subcutaneous tissue, unspecified: Secondary | ICD-10-CM

## 2018-04-18 DIAGNOSIS — Z8781 Personal history of (healed) traumatic fracture: Secondary | ICD-10-CM

## 2018-04-18 DIAGNOSIS — R509 Fever, unspecified: Secondary | ICD-10-CM

## 2018-04-18 DIAGNOSIS — Z967 Presence of other bone and tendon implants: Secondary | ICD-10-CM

## 2018-04-18 DIAGNOSIS — D649 Anemia, unspecified: Secondary | ICD-10-CM

## 2018-04-18 DIAGNOSIS — L02413 Cutaneous abscess of right upper limb: Secondary | ICD-10-CM

## 2018-04-18 DIAGNOSIS — M549 Dorsalgia, unspecified: Secondary | ICD-10-CM

## 2018-04-18 DIAGNOSIS — R7881 Bacteremia: Secondary | ICD-10-CM

## 2018-04-18 LAB — BLOOD CULTURE ID PANEL (REFLEXED)
ACINETOBACTER BAUMANNII: NOT DETECTED
Acinetobacter baumannii: NOT DETECTED
CANDIDA ALBICANS: NOT DETECTED
CANDIDA ALBICANS: NOT DETECTED
CANDIDA GLABRATA: NOT DETECTED
CANDIDA KRUSEI: NOT DETECTED
CANDIDA TROPICALIS: NOT DETECTED
Candida glabrata: NOT DETECTED
Candida krusei: NOT DETECTED
Candida parapsilosis: NOT DETECTED
Candida parapsilosis: NOT DETECTED
Candida tropicalis: NOT DETECTED
ENTEROBACTER CLOACAE COMPLEX: NOT DETECTED
ENTEROBACTERIACEAE SPECIES: NOT DETECTED
ENTEROBACTERIACEAE SPECIES: NOT DETECTED
ENTEROCOCCUS SPECIES: NOT DETECTED
ESCHERICHIA COLI: NOT DETECTED
Enterobacter cloacae complex: NOT DETECTED
Enterococcus species: NOT DETECTED
Escherichia coli: NOT DETECTED
HAEMOPHILUS INFLUENZAE: NOT DETECTED
Haemophilus influenzae: NOT DETECTED
KLEBSIELLA PNEUMONIAE: NOT DETECTED
Klebsiella oxytoca: NOT DETECTED
Klebsiella oxytoca: NOT DETECTED
Klebsiella pneumoniae: NOT DETECTED
LISTERIA MONOCYTOGENES: NOT DETECTED
Listeria monocytogenes: NOT DETECTED
NEISSERIA MENINGITIDIS: NOT DETECTED
Neisseria meningitidis: NOT DETECTED
PROTEUS SPECIES: NOT DETECTED
PSEUDOMONAS AERUGINOSA: NOT DETECTED
PSEUDOMONAS AERUGINOSA: NOT DETECTED
Proteus species: NOT DETECTED
SERRATIA MARCESCENS: NOT DETECTED
STAPHYLOCOCCUS AUREUS BCID: NOT DETECTED
STREPTOCOCCUS AGALACTIAE: NOT DETECTED
STREPTOCOCCUS AGALACTIAE: NOT DETECTED
STREPTOCOCCUS PNEUMONIAE: NOT DETECTED
STREPTOCOCCUS PNEUMONIAE: NOT DETECTED
STREPTOCOCCUS PYOGENES: NOT DETECTED
STREPTOCOCCUS SPECIES: NOT DETECTED
Serratia marcescens: NOT DETECTED
Staphylococcus aureus (BCID): NOT DETECTED
Staphylococcus species: NOT DETECTED
Staphylococcus species: NOT DETECTED
Streptococcus pyogenes: NOT DETECTED
Streptococcus species: NOT DETECTED

## 2018-04-18 LAB — BPAM RBC
Blood Product Expiration Date: 201910252359
ISSUE DATE / TIME: 201910021827
Unit Type and Rh: 5100

## 2018-04-18 LAB — BASIC METABOLIC PANEL
Anion gap: 6 (ref 5–15)
BUN: 8 mg/dL (ref 8–23)
CO2: 20 mmol/L — ABNORMAL LOW (ref 22–32)
CREATININE: 0.5 mg/dL — AB (ref 0.61–1.24)
Calcium: 7.4 mg/dL — ABNORMAL LOW (ref 8.9–10.3)
Chloride: 113 mmol/L — ABNORMAL HIGH (ref 98–111)
GFR calc Af Amer: >60 mL/min (ref >60)
Glucose, Bld: 91 mg/dL (ref 70–99)
Potassium: 3.3 mmol/L — ABNORMAL LOW (ref 3.5–5.1)
SODIUM: 139 mmol/L (ref 135–145)

## 2018-04-18 LAB — CBC
HCT: 25 % — ABNORMAL LOW (ref 40.0–52.0)
Hemoglobin: 8.9 g/dL — ABNORMAL LOW (ref 13.0–18.0)
MCH: 35.9 pg — ABNORMAL HIGH (ref 26.0–34.0)
MCHC: 35.6 g/dL (ref 32.0–36.0)
MCV: 100.7 fL — AB (ref 80.0–100.0)
PLATELETS: 40 10*3/uL — AB (ref 150–440)
RBC: 2.48 MIL/uL — ABNORMAL LOW (ref 4.40–5.90)
RDW: 18.8 % — AB (ref 11.5–14.5)
WBC: 6.2 10*3/uL (ref 3.8–10.6)

## 2018-04-18 LAB — TYPE AND SCREEN
ABO/RH(D): O POS
Antibody Screen: NEGATIVE
Unit division: 0

## 2018-04-18 LAB — HEMOGLOBIN: Hemoglobin: 8.7 g/dL — ABNORMAL LOW (ref 13.0–18.0)

## 2018-04-18 MED ORDER — FLUCONAZOLE 100MG IVPB
100.0000 mg | INTRAVENOUS | Status: DC
  Filled 2018-04-18: qty 50

## 2018-04-18 MED ORDER — FLUCONAZOLE IN SODIUM CHLORIDE 200-0.9 MG/100ML-% IV SOLN
200.0000 mg | Freq: Once | INTRAVENOUS | Status: DC
  Filled 2018-04-18 (×2): qty 100

## 2018-04-18 MED ORDER — TRAZODONE HCL 50 MG PO TABS
50.0000 mg | ORAL_TABLET | Freq: Every evening | ORAL | Status: DC | PRN
  Administered 2018-04-18 – 2018-04-20 (×4): 50 mg via ORAL
  Filled 2018-04-18 (×4): qty 1

## 2018-04-18 MED ORDER — SODIUM CHLORIDE 0.9 % IV SOLN
100.0000 mg | INTRAVENOUS | Status: DC
  Administered 2018-04-19 – 2018-04-23 (×4): 100 mg via INTRAVENOUS
  Filled 2018-04-18 (×6): qty 100

## 2018-04-18 MED ORDER — SODIUM CHLORIDE 0.9 % IV SOLN
200.0000 mg | Freq: Once | INTRAVENOUS | Status: AC
  Administered 2018-04-18: 200 mg via INTRAVENOUS
  Filled 2018-04-18: qty 200

## 2018-04-18 NOTE — Progress Notes (Signed)
Patient ID: [name], male   DOB: 08-01-1955, 62 y.o.   MRN: [id number]  [facility] PROGRESS NOTE  Andrew Gibbs TGG:[id number] DOB: February 03, 1956 DOA: 04/17/2018 PCP: Patient, No Pcp Per  HPI/Subjective: Patient in a lot of pain in bilateral arms.  Also having pain in his thoracic spine.  States he is not doing well.  Objective: Vitals:   04/18/18 0432 04/18/18 1206  BP: (!) 149/63 (!) 158/66  Pulse: 86 76  Resp: 18 16  Temp: 98.9 F (37.2 C) 98.2 F (36.8 C)  SpO2: 92% 98%    Filed Weights   04/17/18 1152 04/18/18 0432  Weight: 54.4 kg 63.7 kg    ROS: Review of Systems  Constitutional: Negative for chills and fever.  Eyes: Negative for blurred vision.  Respiratory: Negative for cough and shortness of breath.   Cardiovascular: Negative for chest pain.  Gastrointestinal: Negative for abdominal pain, constipation, diarrhea, nausea and vomiting.  Genitourinary: Negative for dysuria.  Musculoskeletal: Positive for back pain, joint pain and myalgias.  Neurological: Negative for dizziness and headaches.   Exam: Physical Exam  HENT:  Nose: No mucosal edema.  Mouth/Throat: No oropharyngeal exudate or posterior oropharyngeal edema.  Eyes: Pupils are equal, round, and reactive to light. Conjunctivae, EOM and lids are normal.  Neck: No JVD present. Carotid bruit is not present. No edema present. No thyroid mass and no thyromegaly present.  Cardiovascular: S1 normal and S2 normal. Exam reveals no gallop.  No murmur heard. Pulses:      Dorsalis pedis pulses are 2+ on the right side, and 2+ on the left side.  Respiratory: No respiratory distress. He has no wheezes. He has no rhonchi. He has no rales.  GI: Soft. Bowel sounds are normal. There is no tenderness.  Musculoskeletal:       Right elbow: He exhibits swelling.       Left elbow: He exhibits swelling.       Right ankle: He exhibits no swelling.       Left ankle: He exhibits no swelling.  Lymphadenopathy:     He has no cervical adenopathy.  Neurological: He is alert. No cranial nerve deficit.  Skin: Skin is warm. Nails show no clubbing.  Numerous areas of small little furuncles on bilateral arms.  On the right arm he does have an open tract.  I do not see any specific abscess.  Psychiatric: He has a normal mood and affect.      Data Reviewed: Basic Metabolic Panel: Recent Labs  Lab 04/17/18 1233 04/18/18 0543  NA 139 139  K 3.6 3.3*  CL 109 113*  CO2 21* 20*  GLUCOSE 105* 91  BUN 9 8  CREATININE 0.68 0.50*  CALCIUM 7.8* 7.4*   Liver Function Tests: Recent Labs  Lab 04/17/18 1233  AST 36  ALT 9  ALKPHOS 142*  BILITOT 1.5*  PROT 7.3  ALBUMIN 1.8*   Recent Labs  Lab 04/17/18 1233  LIPASE 40   CBC: Recent Labs  Lab 04/17/18 1233 04/17/18 2350 04/18/18 0543  WBC 3.7*  --  6.2  NEUTROABS 3.2  --   --   HGB 6.3* 8.7* 8.9*  HCT 18.6*  --  25.0*  MCV 105.9*  --  100.7*  PLT 56*  --  40*   Cardiac Enzymes: Recent Labs  Lab 04/17/18 1233  TROPONINI <0.03     Recent Results (from the past 240 hour(s))  Blood Culture (routine x 2)     Status:  None (Preliminary result)   Collection Time: 04/17/18 12:33 PM  Result Value Ref Range Status   Specimen Description BLOOD RIGHT HAND  Final   Special Requests   Final    BOTTLES DRAWN AEROBIC AND ANAEROBIC Blood Culture adequate volume   Culture   Final    NO GROWTH < 24 HOURS Performed at [facility], [address], [place], [place] [address]    Report Status PENDING  Incomplete  Wound or Superficial Culture     Status: None (Preliminary result)   Collection Time: 04/17/18 12:33 PM  Result Value Ref Range Status   Specimen Description   Final    ARM RIGHT UPPER ARM Performed at [facility], [address], [place], [place] [address]    Special Requests   Final    NONE Performed at [facility], [place], [place] [address]    Gram Stain   Final    NO WBC  SEEN FEW GRAM POSITIVE COCCI FEW GRAM NEGATIVE RODS RARE GRAM POSITIVE RODS Performed at [facility], [place] [address] [place], [place] [address]    Culture   Final    FEW STAPHYLOCOCCUS AUREUS FEW SERRATIA MARCESCENS    Report Status PENDING  Incomplete  Blood Culture (routine x 2)     Status: None (Preliminary result)   Collection Time: 04/17/18  1:05 PM  Result Value Ref Range Status   Specimen Description BLOOD LEFT ARM  Final   Special Requests   Final    BOTTLES DRAWN AEROBIC AND ANAEROBIC Blood Culture adequate volume   Culture  Setup Time   Final    Organism ID to follow YEAST AEROBIC BOTTLE ONLY    Culture   Final    YEAST Performed at [facility], [address], [place],  [address]    Report Status PENDING  Incomplete     Studies: Dg Chest Port 1 View  Result Date: 04/17/2018 CLINICAL DATA:  Acute lower abdominal pain. EXAM: PORTABLE CHEST 1 VIEW COMPARISON:  Radiographs of February 13, 2018. FINDINGS: The heart size and mediastinal contours are within normal limits. Both lungs are clear. No pneumothorax or pleural effusion is noted. The visualized skeletal structures are unremarkable. IMPRESSION: No acute cardiopulmonary abnormality seen. Electronically Signed   By: Marijo Conception, M.D.   On: 04/17/2018 12:47    Scheduled Meds: . docusate sodium  100 mg Oral BID   Continuous Infusions: . 0.9 % NaCl with KCl 20 mEq / L Stopped (04/18/18 1511)  . ceFEPime (MAXIPIME) IV 2 g (04/18/18 1513)  . vancomycin Stopped (04/18/18 1350)    Assessment/Plan:  1. Clinical sepsis.  Yeast growing in the blood culture.  we will get infectious disease consultation and started on IV Anidulafungin.  Continue vancomycin and cefepime for right now.  Numerous skin lesions on the arms concerning for MRSA. 2. 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  Prior CAT scan showing a liver mass and recommend an MRI of the abdomen  which I will get.  Send off alpha-fetoprotein. 3. Back pain.  MRI of the thoracic spine secondary to back pain. 4. Anemia of chronic disease.  Patient given a transfusion 1 unit of packed red blood cells with good response.  Hemoglobin now up in the eights. 5. Patient must stop IV drug use  Code Status:     Code Status Orders  (From admission, onward)  Start     Ordered   04/17/18 1446  Full code  Continuous     04/17/18 1446        Code Status History    This patient has a current code status but no historical code status.      Disposition Plan: To be determined  Antibiotics:  Anidalfungin  Vancomycin  Cefepime  Time spent: 28 minutes, case discussed with general surgery and infectious disease.  Branon Sabine [employer]

## 2018-04-18 NOTE — Consult Note (Signed)
NAME: Andrew Gibbs  DOB: July 09, 1956  MRN: [id number]  Date/Time: 04/18/2018 4:24 PM  wieting Subjective:  REASON FOR CONSULT: Yeast in blood ? Andrew Gibbs is a 62 y.o. with a history of history of hepatitis C and daily IV heroin use is admitted with fever and chills and also pain and swelling of the right upper arm. Pt says he last did heroin on the day he came to the ED. He was visiting a friend in group home and felt chills and shakes and his friend called 911 and he came to the ED. He skin pops heroin as he is unable to get a vein in the past many months.  He states he also has mid back pain and if the pain gets worse he always takes some heroin to ease the pain.  He had come to the emergency department in July with left-sided back and chest pain and had MRI of the thoracic spine with and without contrast which did not show any discitis.  He also had cervical spine MRI which which also did not reveal any infection.  In the ED he had a temperature of 100.2 blood pressure of 102/50 heart rate of 16.  Blood cultures were sent and he was started on cefepime and vancomycin.  I am asked to see the patient has his blood culture is positive for yeast.  States he has had 20 pound weight loss.  Denies any GI bleed. Patient denies any endocarditis in the past.  He has a steel plate in his right knee following a  fall and fracture in 2004. He has hepatitis C but has not taken any treatment for it.  Past Medical History:  Diagnosis Date  . Hepatitis C   . Heroin use     Past Surgical History:  Procedure Laterality Date  . KNEE SURGERY      Social history He lives on his own Smoker Occasional alcohol Used to do crack cocaine but as it was expensive he is now using heroin  Family history Mother has bone cancer  Allergies no known drug allergy   ? Current Facility-Administered Medications  Medication Dose Route Frequency Provider Last Rate Last Dose  . 0.9 % NaCl with KCl 20 mEq/ L   infusion   Intravenous Continuous Hillary Bow, MD   Stopped at 04/18/18 1511  . acetaminophen (TYLENOL) tablet 650 mg  650 mg Oral Q6H PRN Hillary Bow, MD       Or  . acetaminophen (TYLENOL) suppository 650 mg  650 mg Rectal Q6H PRN Sudini, Srikar, MD      . albuterol (PROVENTIL) (2.5 MG/3ML) 0.083% nebulizer solution 2.5 mg  2.5 mg Nebulization Q2H PRN Sudini, Alveta Heimlich, MD      . Derrill Memo ON 04/19/2018] anidulafungin (ERAXIS) 100 mg in sodium chloride 0.9 % 100 mL IVPB  100 mg Intravenous Q24H Wieting, Richard, MD      . anidulafungin (ERAXIS) 200 mg in sodium chloride 0.9 % 200 mL IVPB  200 mg Intravenous Once Wieting, Richard, MD      . ceFEPIme (MAXIPIME) 2 g in sodium chloride 0.9 % 100 mL IVPB  2 g Intravenous Q8H Nesbitt, Christopher A, RPH 200 mL/hr at 04/18/18 1513 2 g at 04/18/18 1513  . docusate sodium (COLACE) capsule 100 mg  100 mg Oral BID Hillary Bow, MD   100 mg at 04/18/18 1139  . ketorolac (TORADOL) 30 MG/ML injection 30 mg  30 mg Intravenous Q6H PRN Hillary Bow, MD  30 mg at 04/18/18 1508  . ondansetron (ZOFRAN) tablet 4 mg  4 mg Oral Q6H PRN Hillary Bow, MD       Or  . ondansetron (ZOFRAN) injection 4 mg  4 mg Intravenous Q6H PRN Sudini, Alveta Heimlich, MD      . oxyCODONE (Oxy IR/ROXICODONE) immediate release tablet 5 mg  5 mg Oral Q4H PRN Hillary Bow, MD   5 mg at 04/18/18 1246  . polyethylene glycol (MIRALAX / GLYCOLAX) packet 17 g  17 g Oral Daily PRN Sudini, Alveta Heimlich, MD      . traZODone (DESYREL) tablet 50 mg  50 mg Oral QHS PRN Lance Coon, MD   50 mg at 04/18/18 0115  . vancomycin (VANCOCIN) IVPB 750 mg/150 ml premix  750 mg Intravenous Q12H Shari Prows, RPH   Stopped at 04/18/18 1350    REVIEW OF SYSTEMS:  Const:  fever, chills, negative weight loss of 20 pounds Eyes: negative diplopia or visual changes, negative eye pain ENT: negative coryza, negative sore throat Resp: Positive cough, productive of purulent sputum, no hemoptysis, dyspnea Cards:  negative for chest pain, palpitations, lower extremity edema GU: negative for frequency, dysuria and hematuria GI: Negative for abdominal pain diarrhea, bleeding, constipation Skin: Has bilateral swelling and pain Heme: negative for easy bruising and gum/nose bleeding MS: Complains of back pain Neurolo:negative for headaches, dizziness, vertigo, memory problems  Psych: negative for feelings of anxiety, depression  Endocrine: No polyuria or polydipsia allergy/Immunology- negative for any medication or food allergies ? Pertinent Positives include : Objective:  VITALS:  BP (!) 158/66 (BP Location: Left Leg)   Pulse 76   Temp 98.2 F (36.8 C) (Oral)   Resp 16   Ht 5\' 7"  (1.702 m)   Wt 63.7 kg   SpO2 98%   BMI 21.99 kg/m  PHYSICAL EXAM:  General: Sleepy but easily arousable, cooperative, no obvious distress, disheveled head: Normocephalic, without obvious abnormality, atraumatic. Eyes: Conjunctivae clear, anicteric sclerae. Pupils are constricted, arcus senilis ENT Nares normal. No drainage or sinus tenderness. Lips, mucosa, and tongue normal. No Thrush, poor dentition Neck: Supple, symmetrical, no adenopathy, thyroid: non tender no carotid bruit and no JVD. Back: No CVA tenderness. Lungs: Bilateral air entry, few crackles in the bases Heart: Regular rate and rhythm, no murmur, rub or gallop. Abdomen: Soft, non-tender, mild distention. Bowel sounds normal. No masses Extremities: Multiple pop marks on the upper arms.  Induration and hardening of both arms chronic edema Skin: As above lymph: Cervical, supraclavicular normal. Neurologic: Grossly non-focal Pertinent Labs CBC Latest Ref Rng & Units 04/18/2018 04/17/2018 04/17/2018  WBC 3.8 - 10.6 K/uL 6.2 - 3.7(L)  Hemoglobin 13.0 - 18.0 g/dL 8.9(L) 8.7(L) 6.3(L)  Hematocrit 40.0 - 52.0 % 25.0(L) - 18.6(L)  Platelets 150 - 440 K/uL 40(L) - 56(L)   CMP Latest Ref Rng & Units 04/18/2018 04/17/2018 02/13/2018  Glucose 70 - 99 mg/dL 91  105(H) 130(H)  BUN 8 - 23 mg/dL 8 9 9   Creatinine 0.61 - 1.24 mg/dL 0.50(L) 0.68 0.41(L)  Sodium 135 - 145 mmol/L 139 139 138  Potassium 3.5 - 5.1 mmol/L 3.3(L) 3.6 2.9(L)  Chloride 98 - 111 mmol/L 113(H) 109 109  CO2 22 - 32 mmol/L 20(L) 21(L) 23  Calcium 8.9 - 10.3 mg/dL 7.4(L) 7.8(L) 7.7(L)  Total Protein 6.5 - 8.1 g/dL - 7.3 7.4  Total Bilirubin 0.3 - 1.2 mg/dL - 1.5(H) 2.1(H)  Alkaline Phos 38 - 126 U/L - 142(H) 142(H)  AST 15 - 41 U/L -  36 39  ALT 0 - 44 U/L - 9 10    IMAGING RESULTS: ?X-ray of the chest normal   Impression/Recommendation ? 62 y.o. with a history of history of hepatitis C and daily IV heroin use is admitted with fever and chills and also pain and swelling of the right upper arm. Pt says he last did heroin on the day he came to the ED. He was visiting a friend in group home and felt chills and shakes and his friend called 911 and he came to the ED. He skin pops heroin as he is unable to get a vein in the past many months.  He states he also has mid back pain and if the pain gets worse he always takes some heroin to ease the pain.  He had come to the emergency department in July with left-sided back and chest pain and had MRI of the thoracic spine with and without contrast which did not show any discitis.  He also had cervical spine MRI which which also did not reveal any infection.  In the ED he had a temperature of 100.2 blood pressure of 102/50 heart rate of 16.  Blood cultures were sent and he was started on cefepime and vancomycin.  I am asked to see the patient has his blood culture is positive for yeast.  ? ?Fever and chills in a heroin user, skin pops and tries to mainline. Bilateral upper extremity chronic infection and in duration from skin popping. May need to do an ultrasound of the extremities to look for any collection.  Also Doppler to assess the veins for any thrombus Continue Vanco and cefepime for now  Yeast in the blood.  Likely Candida.  Not sure  what species it is.  Will give IV anidulafungin Will need TEE to rule out endocarditis and eye examination   Back pain.  In July the MRI of the spine was normal.  With a yeast infection and if the pain is persisting may have to rescan him to look for discitis and osteomyelitis.  Anemia: In 2016 he had normal hemoglobin and on this admission it was 6.3 and had to have blood transfusion.  It is  macrocytic.  With underlying hepatitis C need to rule out cirrhosis with the GI bleed or malignancy.  Also need to check B12 and folate  Hepatitis C has not been treated.  Has low platelet -rule out cirrhosis  HIV was negative in July 2019   Discussed with patient, requesting provider I will not be seeing him tomorrow.

## 2018-04-18 NOTE — Progress Notes (Signed)
PHARMACY - PHYSICIAN COMMUNICATION CRITICAL VALUE ALERT - BLOOD CULTURE IDENTIFICATION (BCID)  Results for orders placed or performed during the hospital encounter of 04/17/18  Blood Culture ID Panel (Reflexed) (Collected: 04/17/2018  1:05 PM)  Result Value Ref Range   Enterococcus species NOT DETECTED NOT DETECTED   Listeria monocytogenes NOT DETECTED NOT DETECTED   Staphylococcus species NOT DETECTED NOT DETECTED   Staphylococcus aureus (BCID) NOT DETECTED NOT DETECTED   Streptococcus species NOT DETECTED NOT DETECTED   Streptococcus agalactiae NOT DETECTED NOT DETECTED   Streptococcus pneumoniae NOT DETECTED NOT DETECTED   Streptococcus pyogenes NOT DETECTED NOT DETECTED   Acinetobacter baumannii NOT DETECTED NOT DETECTED   Enterobacteriaceae species NOT DETECTED NOT DETECTED   Enterobacter cloacae complex NOT DETECTED NOT DETECTED   Escherichia coli NOT DETECTED NOT DETECTED   Klebsiella oxytoca NOT DETECTED NOT DETECTED   Klebsiella pneumoniae NOT DETECTED NOT DETECTED   Proteus species NOT DETECTED NOT DETECTED   Serratia marcescens NOT DETECTED NOT DETECTED   Haemophilus influenzae NOT DETECTED NOT DETECTED   Neisseria meningitidis NOT DETECTED NOT DETECTED   Pseudomonas aeruginosa NOT DETECTED NOT DETECTED   Candida albicans NOT DETECTED NOT DETECTED   Candida glabrata NOT DETECTED NOT DETECTED   Candida krusei NOT DETECTED NOT DETECTED   Candida parapsilosis NOT DETECTED NOT DETECTED   Candida tropicalis NOT DETECTED NOT DETECTED    Name of physician (or Provider) Contacted: Dr. Leslye Peer  Changes to prescribed antibiotics required: No change at this point - continue cefepime, vancomycin, Eraxis for yeast in 1 of 4 bottles and GNR in 1 of 4 bottles.  Laural Benes, Pharm.D., BCPS Clinical Pharmacist 04/18/2018  5:45 PM

## 2018-04-18 NOTE — Consult Note (Addendum)
SURGICAL CONSULTATION NOTE (initial) - cpt: [phone]  Patient seen and examined as described below with surgical PA-C, Ardell Isaacs.  Assessment/Plan: (ICD-10's: L02.413) In summary, patient is a 62 y.o. male with IV narcotic abuse-associated improving RUE cellulitis and many areas of induration without overt fluctuance to suggest abscess, complicated by pertinent comorbidities including Hep C and polysubstance abuse, including heroin, tobacco abuse (smoking), and former crack cocaine.   - pain control prn   - continue antibiotics as per primary team  - no evidence of drainable RUE abscess at this time  - consider RUE ultrasound if clinical concern for abscess remains  - will follow and reassess for possible abscess to drain tomorrow  - cessation of heroin, injectable drug use, and smoking advised   - DVT prophylaxis, ambulation encouraged  I have personally reviewed the patient's chart, evaluated/examined the patient, proposed the recommended management, and discussed these recommendations with the patient and medical physician.  Thank you for the opportunity to participate in this patient's care.  -- Marilynne Drivers Rosana Hoes, MD, [place]: [facility] - Partnering for exceptional care. Office: [phone]    SURGICAL CONSULTATION NOTE (initial) - cpt: [phone]  HISTORY OF PRESENT ILLNESS (HPI):  62 y.o. male with a history of IVDA presented to [facility] ED on 10/02 for evaluation of right upper extremity pain. He is a known IVDA who last used injectable heroin at 4 am the day of admission. He complains of right upper extremity swelling in pain which has been worsening over the last week. He has not tried anything for the pain. He endorses recent fevers and chills. No chest pain, SOB, abdominal pain, nausea, or vomiting. Today (10/03), he notes that the pain and swelling of his Right arm have improved significantly, and he only reports pain of his proximal lateral  right arm.   Surgery is consulted by hospitalist physician Dr. Darvin Neighbours, MD in this context for evaluation and management of right upper extremity abscess.   PAST MEDICAL HISTORY ([facility]):  Past Medical History:  Diagnosis Date  . Hepatitis C   . Heroin use      PAST SURGICAL HISTORY ([facility]):  Past Surgical History:  Procedure Laterality Date  . KNEE SURGERY       MEDICATIONS:  Prior to Admission medications   Medication Sig Start Date End Date Taking? Authorizing Provider  ibuprofen (ADVIL,MOTRIN) 200 MG tablet Take 800-1,000 mg by mouth as needed for moderate pain.   Yes [provider]  potassium chloride SA (KLOR-CON M20) 20 MEQ tablet Take 1 tablet (20 mEq total) by mouth daily. Patient not taking: Reported on 04/17/2018 02/14/18   Hinda Kehr, MD     ALLERGIES:  No Known Allergies   SOCIAL HISTORY:  Social History   Socioeconomic History  . Marital status: Widowed    Spouse name: Not on file  . Number of children: Not on file  . Years of education: Not on file  . Highest education level: Not on file  Occupational History  . Not on file  Social Needs  . Financial resource strain: Not on file  . Food insecurity:    Worry: Not on file    Inability: Not on file  . Transportation needs:    Medical: Not on file    Non-medical: Not on file  Tobacco Use  . Smoking status: Current Every Day Smoker  . Smokeless tobacco: Never Used  Substance and Sexual Activity  . Alcohol use: Yes  Comment: 40 oz today  . Drug use: Yes    Types: IV, Cocaine    Comment: heroin, states he used to use cocaine  . Sexual activity: Not on file  Lifestyle  . Physical activity:    Days per week: Not on file    Minutes per session: Not on file  . Stress: Not on file  Relationships  . Social connections:    Talks on phone: Not on file    Gets together: Not on file    Attends religious service: Not on file    Active member of club or organization: Not on file    Attends  meetings of clubs or organizations: Not on file    Relationship status: Not on file  . Intimate partner violence:    Fear of current or ex partner: Not on file    Emotionally abused: Not on file    Physically abused: Not on file    Forced sexual activity: Not on file  Other Topics Concern  . Not on file  Social History Narrative  . Not on file    The patient currently resides (home / rehab facility / nursing home): Home The patient normally is (ambulatory / bedbound): Ambulatory   FAMILY HISTORY:  History reviewed. No pertinent family history.   REVIEW OF SYSTEMS:  Constitutional: denies weight loss, +fever, +chills, denied sweats  Eyes: denies any other vision changes, history of eye injury  ENT: denies sore throat, hearing problems  Respiratory: denies shortness of breath, wheezing  Cardiovascular: denies chest pain, palpitations  Gastrointestinal: denies abdominal pain, N/V, or diarrhea/and bowel function as per HPI Genitourinary: denies burning with urination or urinary frequency Musculoskeletal: denies any other joint pains or cramps  Skin: IV drug use, +RUE pain and swelling as per HPI  Neurological: denies any other headache, dizziness, weakness  Psychiatric: denies any other depression, anxiety   All other review of systems were negative   VITAL SIGNS:  Temp:  [91.4 F (33 C)-100.3 F (37.9 C)] 98.9 F (37.2 C) (10/03 0432) Pulse Rate:  [84-92] 86 (10/03 0432) Resp:  [16-24] 18 (10/03 0432) BP: (89-172)/(50-72) 149/63 (10/03 0432) SpO2:  [92 %-100 %] 92 % (10/03 0432) Weight:  [54.4 kg-63.7 kg] 63.7 kg (10/03 0432)     Height: 5\' 7"  (170.2 cm) Weight: 63.7 kg BMI (Calculated): 21.99   INTAKE/OUTPUT:  This shift: No intake/output data recorded.  Last 2 shifts: @IOLAST2SHIFTS @   PHYSICAL EXAM:  Constitutional:  -- Normal body habitus  -- Awake, alert, and oriented x3, no apparent distress Eyes:  -- Pupils equally round and reactive to light  -- No scleral  icterus, B/L no occular discharge Ear, nose, throat: -- Neck is FROM WNL -- Poor dentition Pulmonary:  -- No wheezes or rhales -- Equal breath sounds bilaterally -- Breathing non-labored at rest Cardiovascular:  -- S1, S2 present  -- No pericardial rubs  Gastrointestinal:  -- Abdomen soft, nontender, non-distended, no guarding or rebound tenderness -- No abdominal masses appreciated, pulsatile or otherwise  Musculoskeletal and Integumentary:  -- Wounds or skin discoloration:  -- Extremities:   Right Upper extremity: Right lateral midarm 2 mm open wound with small amount of draining thin, cloudy, non-purulent drainage with otherwise multiple arm and forearm foci foci of induration, particularly just above the antecubital fossa, which is non-tender, non-fluctuant, and without overlaying erythema. Second area of induration to the right lateral proximal upper arm which is tender but again without surrounding erythema or fluctuance Neurologic:  --  Motor function: Intact and symmetric -- Sensation: Intact and symmetric Psychiatric:  -- Mood and affect WNL    Labs:  CBC Latest Ref Rng & Units 04/18/2018 04/17/2018 04/17/2018  WBC 3.8 - 10.6 K/uL 6.2 - 3.7(L)  Hemoglobin 13.0 - 18.0 g/dL 8.9(L) 8.7(L) 6.3(L)  Hematocrit 40.0 - 52.0 % 25.0(L) - 18.6(L)  Platelets 150 - 440 K/uL 40(L) - 56(L)   CMP Latest Ref Rng & Units 04/18/2018 04/17/2018 02/13/2018  Glucose 70 - 99 mg/dL 91 105(H) 130(H)  BUN 8 - 23 mg/dL 8 9 9   Creatinine 0.61 - 1.24 mg/dL 0.50(L) 0.68 0.41(L)  Sodium 135 - 145 mmol/L 139 139 138  Potassium 3.5 - 5.1 mmol/L 3.3(L) 3.6 2.9(L)  Chloride 98 - 111 mmol/L 113(H) 109 109  CO2 22 - 32 mmol/L 20(L) 21(L) 23  Calcium 8.9 - 10.3 mg/dL 7.4(L) 7.8(L) 7.7(L)  Total Protein 6.5 - 8.1 g/dL - 7.3 7.4  Total Bilirubin 0.3 - 1.2 mg/dL - 1.5(H) 2.1(H)  Alkaline Phos 38 - 126 U/L - 142(H) 142(H)  AST 15 - 41 U/L - 36 39  ALT 0 - 44 U/L - 9 10     Assessment/Plan: (ICD-10's:  L02.413) 62 y.o. male with a right upper extremity abscess secondary to IVDA last used at 4 am the day of admission, complicated by pertinent comorbidities including IV drug abuse, hepatitis C, and chronic tobacco abuse (smoking).   - No obvious fluctuance appreciable on examination this morning, will continue to follow clinically.    - If his clinical condition were to decline, would consider obtaining US of the right upper extremity to evaluate further for possible abscesses amenable to I&D  - Continue IV ABx   - Medicine primary, appreciate their help  All of the above findings and recommendations were discussed with the patient, and all of patient's questions were answered to his expressed satisfaction.  Thank you for the opportunity to participate in this patient's care.   -- Edison Simon, PA-C [facility] 04/18/2018, 8:53 AM [phone] M-F: 7am - 4pm

## 2018-04-19 ENCOUNTER — Inpatient Hospital Stay: Payer: Medicare Other

## 2018-04-19 ENCOUNTER — Inpatient Hospital Stay
Admit: 2018-04-19 | Discharge: 2018-04-19 | Disposition: A | Discharge status: Home / Self Care | Payer: Medicare Other | Attending: Cardiology | Admitting: Cardiology

## 2018-04-19 LAB — HIV ANTIBODY (ROUTINE TESTING W REFLEX): HIV SCREEN 4TH GENERATION: NONREACTIVE

## 2018-04-19 LAB — URINE DRUG SCREEN, QUALITATIVE (ARMC ONLY)
AMPHETAMINES, UR SCREEN: NOT DETECTED
Barbiturates, Ur Screen: NOT DETECTED
Benzodiazepine, Ur Scrn: NOT DETECTED
COCAINE METABOLITE, UR ~~LOC~~: NOT DETECTED
Cannabinoid 50 Ng, Ur ~~LOC~~: NOT DETECTED
MDMA (ECSTASY) UR SCREEN: NOT DETECTED
METHADONE SCREEN, URINE: NOT DETECTED
Opiate, Ur Screen: NOT DETECTED
PHENCYCLIDINE (PCP) UR S: NOT DETECTED
Tricyclic, Ur Screen: NOT DETECTED

## 2018-04-19 LAB — URINE CULTURE: CULTURE: NO GROWTH

## 2018-04-19 LAB — VANCOMYCIN, TROUGH: VANCOMYCIN TR: 8 ug/mL — AB (ref 15–20)

## 2018-04-19 LAB — AFP TUMOR MARKER: AFP, Serum, Tumor Marker: 2219 ng/mL — ABNORMAL HIGH (ref 0.0–8.3)

## 2018-04-19 MED ORDER — LEVOFLOXACIN IN D5W 750 MG/150ML IV SOLN
750.0000 mg | INTRAVENOUS | Status: DC
  Administered 2018-04-19 – 2018-05-02 (×14): 750 mg via INTRAVENOUS
  Filled 2018-04-19 (×15): qty 150

## 2018-04-19 MED ORDER — VANCOMYCIN HCL 10 G IV SOLR
1250.0000 mg | Freq: Two times a day (BID) | INTRAVENOUS | Status: DC
  Administered 2018-04-19 – 2018-04-21 (×4): 1250 mg via INTRAVENOUS
  Filled 2018-04-19 (×6): qty 1250

## 2018-04-19 NOTE — Progress Notes (Signed)
[facility] Progress Note  Chart reviewed with Dr. Rosana Hoes and discussed case with Hospitalist Dr. Leslye Peer.   Cultures showing fungemia with infectious disease on board. Will start anti-fungals. Still no appreciable abscess to drain.   General surgery will sign off. Please re-consult as needed  -- Edison Simon , PA-C [facility] 04/19/2018, 11:49 AM [phone] M-F: 7am - 4pm

## 2018-04-19 NOTE — Progress Notes (Signed)
Patient refused MRI tonight

## 2018-04-19 NOTE — Progress Notes (Signed)
Patient ID: Andrew Gibbs, male   DOB: 03/22/1956, 62 y.o.   MRN: [id number]  [facility] PROGRESS NOTE  PATTY LOPEZGARCIA PRF:[id number] DOB: [birth year] DOA: 04/17/2018 PCP: Patient, No Pcp Per  HPI/Subjective: Patient states the pain in his arms is doing better today than yesterday.  Objective: Vitals:   04/19/18 0449 04/19/18 1215  BP: (!) 152/73 (!) 163/81  Pulse: 61 (!) 54  Resp: 16 17  Temp: 98.2 F (36.8 C) 98.2 F (36.8 C)  SpO2: 98% 100%    Filed Weights   04/17/18 1152 04/18/18 0432 04/19/18 0449  Weight: 54.4 kg 63.7 kg 63.1 kg    ROS: Review of Systems  Constitutional: Negative for chills and fever.  Eyes: Negative for blurred vision.  Respiratory: Negative for cough and shortness of breath.   Cardiovascular: Negative for chest pain.  Gastrointestinal: Negative for abdominal pain, constipation, diarrhea, nausea and vomiting.  Genitourinary: Negative for dysuria.  Musculoskeletal: Positive for back pain, joint pain and myalgias.  Neurological: Negative for dizziness and headaches.   Exam: Physical Exam  HENT:  Nose: No mucosal edema.  Mouth/Throat: No oropharyngeal exudate or posterior oropharyngeal edema.  Eyes: Pupils are equal, round, and reactive to light. Conjunctivae, EOM and lids are normal.  Neck: No JVD present. Carotid bruit is not present. No edema present. No thyroid mass and no thyromegaly present.  Cardiovascular: S1 normal and S2 normal. Exam reveals no gallop.  No murmur heard. Pulses:      Dorsalis pedis pulses are 2+ on the right side, and 2+ on the left side.  Respiratory: No respiratory distress. He has no wheezes. He has no rhonchi. He has no rales.  GI: Soft. Bowel sounds are normal. There is no tenderness.  Musculoskeletal:       Right elbow: He exhibits swelling.       Left elbow: He exhibits swelling.       Right ankle: He exhibits no swelling.       Left ankle: He exhibits no swelling.  Lymphadenopathy:    He has no  cervical adenopathy.  Neurological: He is alert. No cranial nerve deficit.  Skin: Skin is warm. Nails show no clubbing.  Numerous areas of small little furuncles on bilateral arms.  On the right arm he does have an open tract.  I do not see any specific abscess.  Psychiatric: He has a normal mood and affect.      Data Reviewed: Basic Metabolic Panel: Recent Labs  Lab 04/17/18 1233 04/18/18 0543  NA 139 139  K 3.6 3.3*  CL 109 113*  CO2 21* 20*  GLUCOSE 105* 91  BUN 9 8  CREATININE 0.68 0.50*  CALCIUM 7.8* 7.4*   Liver Function Tests: Recent Labs  Lab 04/17/18 1233  AST 36  ALT 9  ALKPHOS 142*  BILITOT 1.5*  PROT 7.3  ALBUMIN 1.8*   Recent Labs  Lab 04/17/18 1233  LIPASE 40   CBC: Recent Labs  Lab 04/17/18 1233 04/17/18 2350 04/18/18 0543  WBC 3.7*  --  6.2  NEUTROABS 3.2  --   --   HGB 6.3* 8.7* 8.9*  HCT 18.6*  --  25.0*  MCV 105.9*  --  100.7*  PLT 56*  --  40*   Cardiac Enzymes: Recent Labs  Lab 04/17/18 1233  TROPONINI <0.03     Recent Results (from the past 240 hour(s))  Urine culture     Status: None   Collection Time: 04/17/18 12:20 PM  Result Value Ref Range Status   Specimen Description   Final    URINE, RANDOM Performed at [facility], [address], [place], [place] [address]    Special Requests   Final    NONE Performed at [facility], [address], [place], [place] [address]    Culture   Final    NO GROWTH Performed at [facility], [place] [address], [place], [place] [address]    Report Status 04/19/2018 FINAL  Final  Blood Culture (routine x 2)     Status: Abnormal (Preliminary result)   Collection Time: 04/17/18 12:33 PM  Result Value Ref Range Status   Specimen Description   Final    BLOOD RIGHT HAND Performed at [facility], [address] [place], [place] [address]    Special Requests   Final    BOTTLES DRAWN AEROBIC AND ANAEROBIC Blood Culture adequate volume Performed at  [facility], [address], [place], [place] [address]    Culture  Setup Time   Final    GRAM NEGATIVE RODS YEAST IN BOTH AEROBIC AND ANAEROBIC BOTTLES CRITICAL VALUE NOTED.  VALUE IS CONSISTENT WITH PREVIOUSLY REPORTED AND CALLED VALUE. Performed at [facility], [address] [place], [place] [address]    Culture STENOTROPHOMONAS [place]  (A)  Final   Report Status PENDING  Incomplete  Wound or Superficial Culture     Status: None (Preliminary result)   Collection Time: 04/17/18 12:33 PM  Result Value Ref Range Status   Specimen Description   Final    ARM RIGHT UPPER ARM Performed at [facility], [address] [place], [place] [address]    Special Requests   Final    NONE Performed at [facility], [address] [place], [place] [address]    Gram Stain   Final    NO WBC SEEN FEW GRAM POSITIVE COCCI FEW GRAM NEGATIVE RODS RARE GRAM POSITIVE RODS    Culture   Final    FEW STAPHYLOCOCCUS AUREUS FEW SERRATIA MARCESCENS SUSCEPTIBILITIES TO FOLLOW Performed at [facility], [place] [address] [place], [place] [address]    Report Status PENDING  Incomplete  Blood Culture (routine x 2)     Status: None (Preliminary result)   Collection Time: 04/17/18  1:05 PM  Result Value Ref Range Status   Specimen Description   Final    BLOOD LEFT ARM Performed at [facility], [address] [place], [place] [address]    Special Requests   Final    BOTTLES DRAWN AEROBIC AND ANAEROBIC Blood Culture adequate volume Performed at [facility], [address] [place], [place] [address]    Culture  Setup Time   Final    YEAST AEROBIC BOTTLE ONLY CRITICAL RESULT CALLED TO, READ BACK BY AND VERIFIED WITH: JASON ROBBINS AT 2500 ON 04/18/18 [place]. GRAM NEGATIVE RODS ANAEROBIC BOTTLE ONLY CRITICAL RESULT CALLED TO, READ BACK BY AND VERIFIED WITH: [place] @1917  04/18/18 AKT Performed at [facility], [place] [address], [place], [place] [address]    Culture YEAST STENOTROPHOMONAS MALTOPHILIA   Final   Report Status PENDING  Incomplete  Blood Culture ID Panel (Reflexed)     Status: None   Collection Time: 04/17/18  1:05 PM  Result Value Ref Range Status   Enterococcus species NOT DETECTED NOT DETECTED Final   Listeria monocytogenes NOT DETECTED NOT DETECTED Final   Staphylococcus species NOT  DETECTED NOT DETECTED Final   Staphylococcus aureus (BCID) NOT DETECTED NOT DETECTED Final   Streptococcus species NOT DETECTED NOT DETECTED Final   Streptococcus agalactiae NOT DETECTED NOT DETECTED Final   Streptococcus pneumoniae NOT DETECTED NOT DETECTED Final   Streptococcus pyogenes NOT DETECTED NOT DETECTED Final   Acinetobacter baumannii NOT DETECTED NOT DETECTED Final   Enterobacteriaceae species NOT DETECTED NOT DETECTED Final   Enterobacter cloacae complex NOT DETECTED NOT DETECTED Final   Escherichia coli NOT DETECTED NOT DETECTED Final   Klebsiella oxytoca NOT DETECTED NOT DETECTED Final   Klebsiella pneumoniae NOT DETECTED NOT DETECTED Final   Proteus species NOT DETECTED NOT DETECTED Final   Serratia marcescens NOT DETECTED NOT DETECTED Final   Haemophilus influenzae NOT DETECTED NOT DETECTED Final   Neisseria meningitidis NOT DETECTED NOT DETECTED Final   Pseudomonas aeruginosa NOT DETECTED NOT DETECTED Final   Candida albicans NOT DETECTED NOT DETECTED Final   Candida glabrata NOT DETECTED NOT DETECTED Final   Candida krusei NOT DETECTED NOT DETECTED Final   Candida parapsilosis NOT DETECTED NOT DETECTED Final   Candida tropicalis NOT DETECTED NOT DETECTED Final    Comment: Performed at [facility], [address] [place], [place] [address]  Blood Culture ID Panel (Reflexed)     Status: None   Collection Time: 04/17/18  3:04 PM  Result Value Ref Range Status   Enterococcus species NOT DETECTED NOT DETECTED Final   Listeria monocytogenes NOT DETECTED NOT DETECTED  Final   Staphylococcus species NOT DETECTED NOT DETECTED Final   Staphylococcus aureus (BCID) NOT DETECTED NOT DETECTED Final   Streptococcus species NOT DETECTED NOT DETECTED Final   Streptococcus agalactiae NOT DETECTED NOT DETECTED Final   Streptococcus pneumoniae NOT DETECTED NOT DETECTED Final   Streptococcus pyogenes NOT DETECTED NOT DETECTED Final   Acinetobacter baumannii NOT DETECTED NOT DETECTED Final   Enterobacteriaceae species NOT DETECTED NOT DETECTED Final   Enterobacter cloacae complex NOT DETECTED NOT DETECTED Final   Escherichia coli NOT DETECTED NOT DETECTED Final   Klebsiella oxytoca NOT DETECTED NOT DETECTED Final   Klebsiella pneumoniae NOT DETECTED NOT DETECTED Final   Proteus species NOT DETECTED NOT DETECTED Final   Serratia marcescens NOT DETECTED NOT DETECTED Final   Haemophilus influenzae NOT DETECTED NOT DETECTED Final   Neisseria meningitidis NOT DETECTED NOT DETECTED Final   Pseudomonas aeruginosa NOT DETECTED NOT DETECTED Final   Candida albicans NOT DETECTED NOT DETECTED Final   Candida glabrata NOT DETECTED NOT DETECTED Final   Candida krusei NOT DETECTED NOT DETECTED Final   Candida parapsilosis NOT DETECTED NOT DETECTED Final   Candida tropicalis NOT DETECTED NOT DETECTED Final    Comment: Performed at [facility], [address] [place], [place] [address]  CULTURE, BLOOD (ROUTINE X 2) w Reflex to ID Panel     Status: None (Preliminary result)   Collection Time: 04/19/18 12:44 AM  Result Value Ref Range Status   Specimen Description BLOOD BLOOD LEFT HAND  Final   Special Requests   Final    BOTTLES DRAWN AEROBIC AND ANAEROBIC Blood Culture adequate volume   Culture   Final    NO GROWTH < 12 HOURS Performed at [facility], [address] [place], [place] [address]    Report Status PENDING  Incomplete  CULTURE, BLOOD (ROUTINE X 2) w Reflex to ID Panel     Status: None (Preliminary result)   Collection Time: 04/19/18 12:46  AM  Result Value Ref Range Status  Specimen Description BLOOD BLOOD RIGHT HAND  Final   Special Requests   Final    BOTTLES DRAWN AEROBIC AND ANAEROBIC Blood Culture adequate volume   Culture   Final    NO GROWTH < 12 HOURS Performed at [facility], [address], [place], [place] [address]    Report Status PENDING  Incomplete     Studies: No results found.  Scheduled Meds: . docusate sodium  100 mg Oral BID   Continuous Infusions: . 0.9 % NaCl with KCl 20 mEq / L Stopped (04/19/18 1138)  . anidulafungin 100 mg (04/19/18 1414)  . levofloxacin (LEVAQUIN) IV 750 mg (04/19/18 1227)  . vancomycin      Assessment/Plan:  1. Clinical sepsis.  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 Appreciate infectious disease consultation and started on IV Anidulafungin, vancomycin and levaquin for right now.  Numerous skin lesions on the arms concerning for MRSA. Infectious disease recommended ophthalmology evaluation and Dr. Edison Pace will see the patient sometime over the next few days but treatment will not change.  Infectious disease also recommended a TEE which will be done on Monday by [facility] clinic cardiology. 2. 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  Prior CAT scan showing a liver mass and recommend an MRI of the abdomen which will get done this afternoon.  Elevated alpha-fetoprotein which could go along with hepatocellular carcinoma 3. Back pain.  MRI of the thoracic spine secondary to back pain. 4. Anemia of chronic disease.  Patient given a transfusion 1 unit of packed red blood cells with good response.  Hemoglobin now up to 8.9. 5. Patient must stop IV drug use  Code Status:     Code Status Orders  (From admission, onward)         Start     Ordered   04/17/18 1446  Full code  Continuous     04/17/18 1446        Code Status History    This patient has a current code status but no historical code  status.      Disposition Plan: Patient will likely stay in the hospital for the entire course of IV antibiotics because I do not want to send a patient with history of IVDA a PICC line and access to his veins at home.  Antibiotics:  Anidalfungin  Vancomycin  Levaquin  Time spent: 28 minutes, case discussed with general surgery and cardiology about timing of TEE, ophthalmology.  Dshaun Reppucci [employer]

## 2018-04-19 NOTE — Consult Note (Signed)
Pharmacy Antibiotic Note  Andrew Gibbs is a 62 y.o. male admitted on 04/17/2018 with sepsis.  Pharmacy has been consulted for cefepime and vancomycin dosing.  Plan: Vancomycin level today resulted at 8 mcg/ml. Level was drawn approximately 3 hours too early so likely trough was closer to 10 mcg/ml. Increase vancomycin to 1250 mg IV Q12H, predicted trough 17 mcg/ml. Pharmacy will continue to follow and adjust as needed to maintain trough 15 to 20 mcg/ml.  Continue cefepime 2 gm IV every 8 hours.  Height: 5\' 7"  (170.2 cm) Weight: 139 lb 1.8 oz (63.1 kg) IBW/kg (Calculated) : 66.1  Temp (24hrs), Avg:98.3 F (36.8 C), Min:98.2 F (36.8 C), Max:98.6 F (37 C)  Recent Labs  Lab 04/17/18 1233 04/17/18 1634 04/18/18 0543 04/19/18 0744  WBC 3.7*  --  6.2  --   CREATININE 0.68  --  0.50*  --   LATICACIDVEN 4.1* 2.7*  --   --   VANCOTROUGH  --   --   --  8*    Estimated Creatinine Clearance: 85.4 mL/min (A) (by C-G formula based on SCr of 0.5 mg/dL (L)).    No Known Allergies  Antimicrobials this admission: Cefepime 10/02 >>  Vancomycin 10/02 >>  Metronidazole 10/02 >>  Dose adjustments this admission:   Microbiology results: 10/02 BCx: 3 of 4 bottles with yeast, 2 of 4 bottles with GNR identified as stenotrophomonas maltophilia 10/04 BCx: NGTD  UCx:    Sputum:    MRSA PCR:  10/02 WoundCx: few staph aureus, few serratia marcescens  Thank you for allowing pharmacy to be a part of this patient's care.  Laural Benes, Pharm.D., BCPS Clinical Pharmacist 04/19/2018 11:04 AM

## 2018-04-19 NOTE — Care Management Important Message (Signed)
Copy of signed IM left with patient in room.  

## 2018-04-19 NOTE — Clinical Social Work Note (Signed)
CSW consulted for "homeless issues." Patient informed staff he wanted to get home to pay some bills. Patient is also an IV herion abuser. CSW to complete assessment at later time. Shela Leff MSW,LCSW [phone]

## 2018-04-19 NOTE — Progress Notes (Signed)
*  PRELIMINARY RESULTS* Echocardiogram 2D Echocardiogram has been performed.  Andrew Gibbs 04/19/2018, 9:59 AM

## 2018-04-19 NOTE — Progress Notes (Signed)
Asked to do tee for bacteremia. Will need to review transthoracic echo which always needs to be completed before tee. Will also need urine drug screen before sedaiton which I have ordered. Will need anesthesia sedation due to history of iv drug use. If urine drug screen is positive, will need to defer sedation.

## 2018-04-19 NOTE — Progress Notes (Signed)
Pt down to MRI of Liver and Thoracic spine but refused to lay in the bed stating that it hurt his back. Pt was given pain med at lease an hour before going down to MRI.

## 2018-04-20 ENCOUNTER — Inpatient Hospital Stay: Payer: Medicare Other

## 2018-04-20 MED ORDER — GADOBUTROL 1 MMOL/ML IV SOLN: 6.0000 mL | Freq: Once | INTRAVENOUS | Status: DC | PRN

## 2018-04-20 MED ORDER — GADOBUTROL 1 MMOL/ML IV SOLN
6.0000 mL | Freq: Once | INTRAVENOUS | Status: AC | PRN
  Administered 2018-04-20: 6 mL via INTRAVENOUS

## 2018-04-20 NOTE — Progress Notes (Signed)
ID IVDA Skin popping wounds arms Yeast and multiple bacteria in blood (stenotrophomonas,FLAVOBACTERIUM INDOLOGENES ) Serratia in wound culture  Also has c7-T1 discitis and osteomyelitis  TEE on Monday Very high AFP-in patient with HepC- will need imaging of the liver Continue anidulafungin, vanco and levaquin

## 2018-04-20 NOTE — Progress Notes (Signed)
Patient ID: Andrew Gibbs, male   DOB: 10-10-1955, 62 y.o.   MRN: [id number]  [facility] PROGRESS NOTE  SALIK GREWELL QVZ:[id number] DOB: [birth year] DOA: 04/17/2018 PCP: Patient, No Pcp Per  HPI/Subjective: Continues to complain of pain in his back  Objective: Vitals:   04/20/18 0500 04/20/18 1156  BP: (!) 141/69 (!) 119/55  Pulse: (!) 51 (!) 58  Resp: 20 16  Temp: 98.1 F (36.7 C) 98.4 F (36.9 C)  SpO2: 100% 98%    Filed Weights   04/18/18 0432 04/19/18 0449 04/20/18 0500  Weight: 63.7 kg 63.1 kg 63.1 kg    ROS: Review of Systems  Constitutional: Negative for chills and fever.  Eyes: Negative for blurred vision.  Respiratory: Negative for cough and shortness of breath.   Cardiovascular: Negative for chest pain.  Gastrointestinal: Negative for abdominal pain, constipation, diarrhea, nausea and vomiting.  Genitourinary: Negative for dysuria.  Musculoskeletal: Positive for back pain, joint pain and myalgias.  Neurological: Negative for dizziness and headaches.   Exam: Physical Exam  HENT:  Nose: No mucosal edema.  Mouth/Throat: No oropharyngeal exudate or posterior oropharyngeal edema.  Eyes: Pupils are equal, round, and reactive to light. Conjunctivae, EOM and lids are normal.  Neck: No JVD present. Carotid bruit is not present. No edema present. No thyroid mass and no thyromegaly present.  Cardiovascular: S1 normal and S2 normal. Exam reveals no gallop.  No murmur heard. Pulses:      Dorsalis pedis pulses are 2+ on the right side, and 2+ on the left side.  Respiratory: No respiratory distress. He has no wheezes. He has no rhonchi. He has no rales.  GI: Soft. Bowel sounds are normal. There is no tenderness.  Musculoskeletal:       Right elbow: He exhibits swelling.       Left elbow: He exhibits swelling.       Right ankle: He exhibits no swelling.       Left ankle: He exhibits no swelling.  Lymphadenopathy:    He has no cervical adenopathy.   Neurological: He is alert. No cranial nerve deficit.  Skin: Skin is warm. Nails show no clubbing.  Numerous areas of small little furuncles on bilateral arms.  On the right arm he does have an open tract.  I do not see any specific abscess.  Psychiatric: He has a normal mood and affect.      Data Reviewed: Basic Metabolic Panel: Recent Labs  Lab 04/17/18 1233 04/18/18 0543  NA 139 139  K 3.6 3.3*  CL 109 113*  CO2 21* 20*  GLUCOSE 105* 91  BUN 9 8  CREATININE 0.68 0.50*  CALCIUM 7.8* 7.4*   Liver Function Tests: Recent Labs  Lab 04/17/18 1233  AST 36  ALT 9  ALKPHOS 142*  BILITOT 1.5*  PROT 7.3  ALBUMIN 1.8*   Recent Labs  Lab 04/17/18 1233  LIPASE 40   CBC: Recent Labs  Lab 04/17/18 1233 04/17/18 2350 04/18/18 0543  WBC 3.7*  --  6.2  NEUTROABS 3.2  --   --   HGB 6.3* 8.7* 8.9*  HCT 18.6*  --  25.0*  MCV 105.9*  --  100.7*  PLT 56*  --  40*   Cardiac Enzymes: Recent Labs  Lab 04/17/18 1233  TROPONINI <0.03     Recent Results (from the past 240 hour(s))  Urine culture     Status: None   Collection Time: 04/17/18 12:20 PM  Result Value Ref Range  Status   Specimen Description   Final    URINE, RANDOM Performed at [facility], [address], [place], [place] [address]    Special Requests   Final    NONE Performed at [facility], [address] [place], [place] [address]    Culture   Final    NO GROWTH Performed at [facility], [place] [address] [place], [place] [address]    Report Status 04/19/2018 FINAL  Final  Blood Culture (routine x 2)     Status: Abnormal (Preliminary result)   Collection Time: 04/17/18 12:33 PM  Result Value Ref Range Status   Specimen Description   Final    BLOOD RIGHT HAND Performed at [facility], [address] [place], [place] [address]    Special Requests   Final    BOTTLES DRAWN AEROBIC AND ANAEROBIC Blood Culture adequate volume Performed at [facility], [address], [place], [place] [address]    Culture  Setup Time   Final    GRAM NEGATIVE RODS YEAST IN BOTH AEROBIC AND ANAEROBIC BOTTLES CRITICAL VALUE NOTED.  VALUE IS CONSISTENT WITH PREVIOUSLY REPORTED AND CALLED VALUE. Performed at [facility], [address] [place], [place] [address]    Culture STENOTROPHOMONAS [place]  (A)  Final   Report Status PENDING  Incomplete  Wound or Superficial Culture     Status: None (Preliminary result)   Collection Time: 04/17/18 12:33 PM  Result Value Ref Range Status   Specimen Description ARM RIGHT UPPER ARM  Final   Special Requests NONE  Final   Gram Stain   Final    NO WBC SEEN FEW GRAM POSITIVE COCCI FEW GRAM NEGATIVE RODS RARE GRAM POSITIVE RODS    Culture   Final    FEW STAPHYLOCOCCUS AUREUS FEW SERRATIA MARCESCENS SUSCEPTIBILITIES TO FOLLOW    Report Status PENDING  Incomplete   Organism ID, Bacteria SERRATIA MARCESCENS  Final      Susceptibility   Serratia marcescens - MIC*    CEFAZOLIN >=64 RESISTANT Resistant     CEFEPIME <=1 SENSITIVE Sensitive     CEFTAZIDIME <=1 SENSITIVE Sensitive     CEFTRIAXONE <=1 SENSITIVE Sensitive     CIPROFLOXACIN <=0.25 SENSITIVE Sensitive     GENTAMICIN <=1 SENSITIVE Sensitive     TRIMETH/SULFA Value in next row Sensitive      <=20 SENSITIVEPerformed at [place] [address] [place], [place] [address]    * FEW SERRATIA MARCESCENS  Blood Culture (routine x 2)     Status: None (Preliminary result)   Collection Time: 04/17/18  1:05 PM  Result Value Ref Range Status   Specimen Description   Final    BLOOD LEFT ARM Performed at [facility], [address] [place], [place] [address]    Special Requests   Final    BOTTLES DRAWN AEROBIC AND ANAEROBIC Blood Culture adequate volume Performed at [facility], [address] [place], [place] [address]    Culture  Setup Time   Final    YEAST AEROBIC BOTTLE ONLY CRITICAL RESULT  CALLED TO, READ BACK BY AND VERIFIED WITH: JASON ROBBINS AT 3846 ON 04/18/18 [place]. GRAM NEGATIVE RODS ANAEROBIC BOTTLE ONLY CRITICAL RESULT CALLED TO, READ BACK BY AND VERIFIED WITH: [place] @1917  04/18/18 [facility]    Culture   Final    YEAST STENOTROPHOMONAS MALTOPHILIA SUSCEPTIBILITIES TO FOLLOW Performed at [facility], [place] [address]  [place], [place] [address]    Report Status PENDING  Incomplete  Blood Culture ID Panel (Reflexed)     Status: None   Collection Time: 04/17/18  1:05 PM  Result Value Ref Range Status   Enterococcus species NOT DETECTED NOT DETECTED Final   Listeria monocytogenes NOT DETECTED NOT DETECTED Final   Staphylococcus species NOT DETECTED NOT DETECTED Final   Staphylococcus aureus (BCID) NOT DETECTED NOT DETECTED Final   Streptococcus species NOT DETECTED NOT DETECTED Final   Streptococcus agalactiae NOT DETECTED NOT DETECTED Final   Streptococcus pneumoniae NOT DETECTED NOT DETECTED Final   Streptococcus pyogenes NOT DETECTED NOT DETECTED Final   Acinetobacter baumannii NOT DETECTED NOT DETECTED Final   Enterobacteriaceae species NOT DETECTED NOT DETECTED Final   Enterobacter cloacae complex NOT DETECTED NOT DETECTED Final   Escherichia coli NOT DETECTED NOT DETECTED Final   Klebsiella oxytoca NOT DETECTED NOT DETECTED Final   Klebsiella pneumoniae NOT DETECTED NOT DETECTED Final   Proteus species NOT DETECTED NOT DETECTED Final   Serratia marcescens NOT DETECTED NOT DETECTED Final   Haemophilus influenzae NOT DETECTED NOT DETECTED Final   Neisseria meningitidis NOT DETECTED NOT DETECTED Final   Pseudomonas aeruginosa NOT DETECTED NOT DETECTED Final   Candida albicans NOT DETECTED NOT DETECTED Final   Candida glabrata NOT DETECTED NOT DETECTED Final   Candida krusei NOT DETECTED NOT DETECTED Final   Candida parapsilosis NOT DETECTED NOT DETECTED Final   Candida tropicalis NOT DETECTED NOT DETECTED Final    Comment: Performed at [facility], [address] [place], [place] [address]  Blood Culture ID Panel (Reflexed)     Status: None   Collection Time: 04/17/18  3:04 PM  Result Value Ref Range Status   Enterococcus species NOT DETECTED NOT DETECTED Final   Listeria monocytogenes NOT DETECTED NOT DETECTED Final   Staphylococcus species NOT DETECTED NOT DETECTED Final   Staphylococcus aureus (BCID) NOT DETECTED NOT DETECTED Final   Streptococcus species NOT DETECTED NOT DETECTED Final   Streptococcus agalactiae NOT DETECTED NOT DETECTED Final   Streptococcus pneumoniae NOT DETECTED NOT DETECTED Final   Streptococcus pyogenes NOT DETECTED NOT DETECTED Final   Acinetobacter baumannii NOT DETECTED NOT DETECTED Final   Enterobacteriaceae species NOT DETECTED NOT DETECTED Final   Enterobacter cloacae complex NOT DETECTED NOT DETECTED Final   Escherichia coli NOT DETECTED NOT DETECTED Final   Klebsiella oxytoca NOT DETECTED NOT DETECTED Final   Klebsiella pneumoniae NOT DETECTED NOT DETECTED Final   Proteus species NOT DETECTED NOT DETECTED Final   Serratia marcescens NOT DETECTED NOT DETECTED Final   Haemophilus influenzae NOT DETECTED NOT DETECTED Final   Neisseria meningitidis NOT DETECTED NOT DETECTED Final   Pseudomonas aeruginosa NOT DETECTED NOT DETECTED Final   Candida albicans NOT DETECTED NOT DETECTED Final   Candida glabrata NOT DETECTED NOT DETECTED Final   Candida krusei NOT DETECTED NOT DETECTED Final   Candida parapsilosis NOT DETECTED NOT DETECTED Final   Candida tropicalis NOT DETECTED NOT DETECTED Final    Comment: Performed at [facility], [address] [place], [place] [address]  CULTURE, BLOOD (ROUTINE X 2) w Reflex to ID Panel     Status: None (Preliminary result)   Collection Time: 04/19/18 12:44 AM  Result Value Ref Range Status   Specimen Description BLOOD BLOOD LEFT HAND  Final   Special Requests   Final    BOTTLES DRAWN AEROBIC AND ANAEROBIC Blood Culture adequate volume    Culture   Final  NO GROWTH 1 DAY Performed at [facility], [address] [place], [place] [address]    Report Status PENDING  Incomplete  CULTURE, BLOOD (ROUTINE X 2) w Reflex to ID Panel     Status: None (Preliminary result)   Collection Time: 04/19/18 12:46 AM  Result Value Ref Range Status   Specimen Description BLOOD BLOOD RIGHT HAND  Final   Special Requests   Final    BOTTLES DRAWN AEROBIC AND ANAEROBIC Blood Culture adequate volume   Culture   Final    NO GROWTH 1 DAY Performed at [facility], [address] [place], [place] [address]    Report Status PENDING  Incomplete     Studies: No results found.  Scheduled Meds: . docusate sodium  100 mg Oral BID   Continuous Infusions: . 0.9 % NaCl with KCl 20 mEq / L 100 mL/hr at 04/20/18 0938  . anidulafungin Stopped (04/19/18 1614)  . levofloxacin (LEVAQUIN) IV Stopped (04/19/18 1404)  . vancomycin Stopped (04/19/18 2327)    Assessment/Plan:  1. Clinical sepsis.  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 Appreciate infectious disease consultation and continue IV Anidulafungin, vancomycin and levaquin for right now.  Numerous skin lesions on the arms concerning for MRSA. Infectious disease recommended ophthalmology evaluation and Dr. Edison Pace will see the patient sometime over the next few days but treatment will not change.  Infectious disease also recommended a TEE which will be done on Monday by [facility] clinic cardiology. 2. 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  Prior CAT scan showing a liver mass and recommend an MRI of the abdomen which will get done this afternoon.  Elevated alpha-fetoprotein which could go along with hepatocellular carcinoma 3. Back pain.  MRI of the thoracic spine secondary to back pain. 4. Anemia of chronic disease.  Patient given a transfusion 1 unit of packed red blood cells with good response.  Hemoglobin  now up to 8.9. 5. Patient must stop IV drug use  Code Status:     Code Status Orders  (From admission, onward)         Start     Ordered   04/17/18 1446  Full code  Continuous     04/17/18 1446        Code Status History    This patient has a current code status but no historical code status.      Disposition Plan: Patient will likely stay in the hospital for the entire course of IV antibiotics because I do not want to send a patient with history of IVDA a PICC line and access to his veins at home.  Antibiotics:  Anidalfungin  Vancomycin  Levaquin  Time spent: 28 minutes, case discussed with general surgery and cardiology about timing of TEE, ophthalmology.  Adriane Gabbert [facility]

## 2018-04-21 DIAGNOSIS — C220 Liver cell carcinoma: Secondary | ICD-10-CM

## 2018-04-21 DIAGNOSIS — B1920 Unspecified viral hepatitis C without hepatic coma: Secondary | ICD-10-CM

## 2018-04-21 DIAGNOSIS — F1910 Other psychoactive substance abuse, uncomplicated: Secondary | ICD-10-CM

## 2018-04-21 DIAGNOSIS — F17200 Nicotine dependence, unspecified, uncomplicated: Secondary | ICD-10-CM

## 2018-04-21 LAB — BASIC METABOLIC PANEL
ANION GAP: 1 — AB (ref 5–15)
BUN: 11 mg/dL (ref 8–23)
CO2: 20 mmol/L — ABNORMAL LOW (ref 22–32)
Calcium: 7.6 mg/dL — ABNORMAL LOW (ref 8.9–10.3)
Chloride: 115 mmol/L — ABNORMAL HIGH (ref 98–111)
Creatinine, Ser: 0.56 mg/dL — ABNORMAL LOW (ref 0.61–1.24)
Glucose, Bld: 104 mg/dL — ABNORMAL HIGH (ref 70–99)
POTASSIUM: 3.7 mmol/L (ref 3.5–5.1)
SODIUM: 136 mmol/L (ref 135–145)

## 2018-04-21 LAB — CBC
HCT: 30.5 % — ABNORMAL LOW (ref 40.0–52.0)
Hemoglobin: 10.3 g/dL — ABNORMAL LOW (ref 13.0–18.0)
MCH: 33.6 pg (ref 26.0–34.0)
MCHC: 33.8 g/dL (ref 32.0–36.0)
MCV: 99.4 fL (ref 80.0–100.0)
Platelets: 48 10*3/uL — ABNORMAL LOW (ref 150–440)
RBC: 3.07 MIL/uL — ABNORMAL LOW (ref 4.40–5.90)
RDW: 17.9 % — AB (ref 11.5–14.5)
WBC: 4.5 10*3/uL (ref 3.8–10.6)

## 2018-04-21 LAB — AEROBIC CULTURE W GRAM STAIN (SUPERFICIAL SPECIMEN): Gram Stain: NONE SEEN

## 2018-04-21 LAB — AEROBIC CULTURE  (SUPERFICIAL SPECIMEN)

## 2018-04-21 LAB — VANCOMYCIN, TROUGH: VANCOMYCIN TR: 8 ug/mL — AB (ref 15–20)

## 2018-04-21 MED ORDER — VANCOMYCIN HCL IN DEXTROSE 1-5 GM/200ML-% IV SOLN
1000.0000 mg | Freq: Three times a day (TID) | INTRAVENOUS | Status: DC
  Administered 2018-04-21 – 2018-04-24 (×8): 1000 mg via INTRAVENOUS
  Filled 2018-04-21 (×13): qty 200

## 2018-04-21 NOTE — Consult Note (Signed)
Pharmacy Antibiotic Note  Andrew Gibbs is a 62 y.o. male admitted on 04/17/2018 with sepsis.  Pharmacy has been consulted for  vancomycin dosing.  Plan: Vancomycin level today resulted at 8 mcg/ml. Level was drawn approximately 1.5 hours too early so true trough is likely lower. Will change to Vancomycin 1 g IV q8 hours. Will recheck trough level prior to the 5th dose of new regimen.   Height: 5\' 7"  (170.2 cm) Weight: 143 lb 4.8 oz (65 kg) IBW/kg (Calculated) : 66.1  Temp (24hrs), Avg:98.4 F (36.9 C), Min:98.2 F (36.8 C), Max:98.5 F (36.9 C)  Recent Labs  Lab 04/17/18 1233 04/17/18 1634 04/18/18 0543 04/19/18 0744 04/21/18 0347 04/21/18 1120  WBC 3.7*  --  6.2  --  4.5  --   CREATININE 0.68  --  0.50*  --  0.56*  --   LATICACIDVEN 4.1* 2.7*  --   --   --   --   VANCOTROUGH  --   --   --  8*  --  8*    Estimated Creatinine Clearance: 88 mL/min (A) (by C-G formula based on SCr of 0.56 mg/dL (L)).    No Known Allergies  Antimicrobials this admission: Cefepime 10/02 >>  Vancomycin 10/02 >>  Metronidazole 10/02 >>  Dose adjustments this admission:   Microbiology results: 10/02 BCx: 3 of 4 bottles with yeast, 2 of 4 bottles with GNR identified as stenotrophomonas maltophilia 10/04 BCx: NGTD  UCx:    Sputum:    MRSA PCR:  10/02 WoundCx: few staph aureus, few serratia marcescens  Thank you for allowing pharmacy to be a part of this patient's care.  Efstathios Sawin D, Pharm.D., BCPS Clinical Pharmacist 04/21/2018 2:23 PM

## 2018-04-21 NOTE — Progress Notes (Signed)
Patient ID: Andrew Gibbs, male   DOB: 02-29-1956, 62 y.o.   MRN: [id number]  [facility] PROGRESS NOTE  Andrew YURKO AJO:[id number] DOB: [birth year] DOA: 04/17/2018 PCP: Patient, No Pcp Per  HPI/Subjective: Patient states that he is feeling better back pain improved  Objective: Vitals:   04/21/18 0449 04/21/18 1235  BP: (!) 142/62 (!) 159/72  Pulse: (!) 55 (!) 52  Resp: 18 16  Temp: 98.2 F (36.8 C) 98.4 F (36.9 C)  SpO2: 98% 100%    Filed Weights   04/19/18 0449 04/20/18 0500 04/21/18 0500  Weight: 63.1 kg 63.1 kg 65 kg    ROS: Review of Systems  Constitutional: Negative for chills and fever.  Eyes: Negative for blurred vision.  Respiratory: Negative for cough and shortness of breath.   Cardiovascular: Negative for chest pain.  Gastrointestinal: Negative for abdominal pain, constipation, diarrhea, nausea and vomiting.  Genitourinary: Negative for dysuria.  Musculoskeletal: Positive for back pain, joint pain and myalgias.  Neurological: Negative for dizziness and headaches.   Exam: Physical Exam  HENT:  Nose: No mucosal edema.  Mouth/Throat: No oropharyngeal exudate or posterior oropharyngeal edema.  Eyes: Pupils are equal, round, and reactive to light. Conjunctivae, EOM and lids are normal.  Neck: No JVD present. Carotid bruit is not present. No edema present. No thyroid mass and no thyromegaly present.  Cardiovascular: S1 normal and S2 normal. Exam reveals no gallop.  No murmur heard. Pulses:      Dorsalis pedis pulses are 2+ on the right side, and 2+ on the left side.  Respiratory: No respiratory distress. He has no wheezes. He has no rhonchi. He has no rales.  GI: Soft. Bowel sounds are normal. There is no tenderness.  Musculoskeletal:       Right elbow: He exhibits swelling.       Left elbow: He exhibits swelling.       Right ankle: He exhibits no swelling.       Left ankle: He exhibits no swelling.  Lymphadenopathy:    He has no cervical  adenopathy.  Neurological: He is alert. No cranial nerve deficit.  Skin: Skin is warm. Nails show no clubbing.  Numerous areas of small little furuncles on bilateral arms.  On the right arm he does have an open tract.  I do not see any specific abscess.  Psychiatric: He has a normal mood and affect.      Data Reviewed: Basic Metabolic Panel: Recent Labs  Lab 04/17/18 1233 04/18/18 0543 04/21/18 0347  NA 139 139 136  K 3.6 3.3* 3.7  CL 109 113* 115*  CO2 21* 20* 20*  GLUCOSE 105* 91 104*  BUN 9 8 11   CREATININE 0.68 0.50* 0.56*  CALCIUM 7.8* 7.4* 7.6*   Liver Function Tests: Recent Labs  Lab 04/17/18 1233  AST 36  ALT 9  ALKPHOS 142*  BILITOT 1.5*  PROT 7.3  ALBUMIN 1.8*   Recent Labs  Lab 04/17/18 1233  LIPASE 40   CBC: Recent Labs  Lab 04/17/18 1233 04/17/18 2350 04/18/18 0543 04/21/18 0347  WBC 3.7*  --  6.2 4.5  NEUTROABS 3.2  --   --   --   HGB 6.3* 8.7* 8.9* 10.3*  HCT 18.6*  --  25.0* 30.5*  MCV 105.9*  --  100.7* 99.4  PLT 56*  --  40* 48*   Cardiac Enzymes: Recent Labs  Lab 04/17/18 1233  TROPONINI <0.03     Recent Results (from the past 240  hour(s))  Urine culture     Status: None   Collection Time: 04/17/18 12:20 PM  Result Value Ref Range Status   Specimen Description   Final    URINE, RANDOM Performed at [facility], [address] [place], [place] [address]    Special Requests   Final    NONE Performed at [facility], [address], [place], [place] [address]    Culture   Final    NO GROWTH Performed at [facility], [place] [address] [place], [place] [address]    Report Status 04/19/2018 FINAL  Final  Blood Culture (routine x 2)     Status: Abnormal (Preliminary result)   Collection Time: 04/17/18 12:33 PM  Result Value Ref Range Status   Specimen Description   Final    BLOOD RIGHT HAND Performed at [facility], [address], [place], [place] [address]    Special Requests    Final    BOTTLES DRAWN AEROBIC AND ANAEROBIC Blood Culture adequate volume Performed at [facility], [address], [place], [place] [address]    Culture  Setup Time   Final    GRAM NEGATIVE RODS YEAST IN BOTH AEROBIC AND ANAEROBIC BOTTLES CRITICAL VALUE NOTED.  VALUE IS CONSISTENT WITH PREVIOUSLY REPORTED AND CALLED VALUE. Performed at [facility], [address] [place], [place] [address]    Culture (A)  Final    FLAVOBACTERIUM INDOLOGENES YEAST STENOTROPHOMONAS MALTOPHILIA SUSCEPTIBILITIES TO FOLLOW Performed at [facility], [place] [address] [place], [place] [address]    Report Status PENDING  Incomplete  Wound or Superficial Culture     Status: None   Collection Time: 04/17/18 12:33 PM  Result Value Ref Range Status   Specimen Description   Final    ARM RIGHT UPPER ARM Performed at [facility], [address], [place], [place] [address]    Special Requests   Final    NONE Performed at [facility], [place], [place] [address]    Gram Stain   Final    NO WBC SEEN FEW GRAM POSITIVE COCCI FEW GRAM NEGATIVE RODS RARE GRAM POSITIVE RODS Performed at [facility], [place] [address] [place], [place] [address]    Culture   Final    FEW STAPHYLOCOCCUS AUREUS FEW SERRATIA MARCESCENS    Report Status 04/21/2018 FINAL  Final   Organism ID, Bacteria SERRATIA MARCESCENS  Final   Organism ID, Bacteria STAPHYLOCOCCUS AUREUS  Final      Susceptibility   Staphylococcus aureus - MIC*    CIPROFLOXACIN <=0.5 SENSITIVE Sensitive     ERYTHROMYCIN <=0.25 SENSITIVE Sensitive     GENTAMICIN <=0.5 SENSITIVE Sensitive     OXACILLIN <=0.25 SENSITIVE Sensitive     TETRACYCLINE <=1 SENSITIVE Sensitive     VANCOMYCIN 1 SENSITIVE Sensitive     TRIMETH/SULFA <=10 SENSITIVE Sensitive     CLINDAMYCIN <=0.25 SENSITIVE Sensitive     RIFAMPIN <=0.5 SENSITIVE Sensitive     Inducible Clindamycin NEGATIVE Sensitive     * FEW  STAPHYLOCOCCUS AUREUS   Serratia marcescens - MIC*    CEFAZOLIN >=64 RESISTANT Resistant     CEFEPIME <=1 SENSITIVE Sensitive     CEFTAZIDIME <=1 SENSITIVE Sensitive     CEFTRIAXONE <=1 SENSITIVE Sensitive     CIPROFLOXACIN <=0.25 SENSITIVE Sensitive     GENTAMICIN <=1 SENSITIVE Sensitive     TRIMETH/SULFA <=20 SENSITIVE Sensitive     * FEW  SERRATIA MARCESCENS  Blood Culture (routine x 2)     Status: Abnormal   Collection Time: 04/17/18  1:05 PM  Result Value Ref Range Status   Specimen Description   Final    BLOOD LEFT ARM Performed at [facility], [address] [place], [place] [address]    Special Requests   Final    BOTTLES DRAWN AEROBIC AND ANAEROBIC Blood Culture adequate volume Performed at [facility], [address] [place], [place] [address]    Culture  Setup Time   Final    YEAST AEROBIC BOTTLE ONLY CRITICAL RESULT CALLED TO, READ BACK BY AND VERIFIED WITH: JASON ROBBINS AT 5277 ON 04/18/18 [place]. GRAM NEGATIVE RODS ANAEROBIC BOTTLE ONLY CRITICAL RESULT CALLED TO, READ BACK BY AND VERIFIED WITH: [place] @1917  04/18/18 AKT Performed at [facility], [place] [address], [place], [place] [address]    Culture CANDIDA LUSITANIAE STENOTROPHOMONAS MALTOPHILIA  (A)  Final   Report Status 04/21/2018 FINAL  Final   Organism ID, Bacteria STENOTROPHOMONAS MALTOPHILIA  Final      Susceptibility   Stenotrophomonas maltophilia - MIC*    LEVOFLOXACIN 0.5 SENSITIVE Sensitive     TRIMETH/SULFA <=20 SENSITIVE Sensitive     * STENOTROPHOMONAS MALTOPHILIA  Blood Culture ID Panel (Reflexed)     Status: None   Collection Time: 04/17/18  1:05 PM  Result Value Ref Range Status   Enterococcus species NOT DETECTED NOT DETECTED Final   Listeria monocytogenes NOT DETECTED NOT DETECTED Final   Staphylococcus species NOT DETECTED NOT DETECTED Final   Staphylococcus aureus (BCID) NOT DETECTED NOT DETECTED Final   Streptococcus species NOT DETECTED NOT DETECTED  Final   Streptococcus agalactiae NOT DETECTED NOT DETECTED Final   Streptococcus pneumoniae NOT DETECTED NOT DETECTED Final   Streptococcus pyogenes NOT DETECTED NOT DETECTED Final   Acinetobacter baumannii NOT DETECTED NOT DETECTED Final   Enterobacteriaceae species NOT DETECTED NOT DETECTED Final   Enterobacter cloacae complex NOT DETECTED NOT DETECTED Final   Escherichia coli NOT DETECTED NOT DETECTED Final   Klebsiella oxytoca NOT DETECTED NOT DETECTED Final   Klebsiella pneumoniae NOT DETECTED NOT DETECTED Final   Proteus species NOT DETECTED NOT DETECTED Final   Serratia marcescens NOT DETECTED NOT DETECTED Final   Haemophilus influenzae NOT DETECTED NOT DETECTED Final   Neisseria meningitidis NOT DETECTED NOT DETECTED Final   Pseudomonas aeruginosa NOT DETECTED NOT DETECTED Final   Candida albicans NOT DETECTED NOT DETECTED Final   Candida glabrata NOT DETECTED NOT DETECTED Final   Candida krusei NOT DETECTED NOT DETECTED Final   Candida parapsilosis NOT DETECTED NOT DETECTED Final   Candida tropicalis NOT DETECTED NOT DETECTED Final    Comment: Performed at [facility], [address] [place], [place] [address]  Blood Culture ID Panel (Reflexed)     Status: None   Collection Time: 04/17/18  3:04 PM  Result Value Ref Range Status   Enterococcus species NOT DETECTED NOT DETECTED Final   Listeria monocytogenes NOT DETECTED NOT DETECTED Final   Staphylococcus species NOT DETECTED NOT DETECTED Final   Staphylococcus aureus (BCID) NOT DETECTED NOT DETECTED Final   Streptococcus species NOT DETECTED NOT DETECTED Final   Streptococcus agalactiae NOT DETECTED NOT DETECTED Final   Streptococcus pneumoniae NOT DETECTED NOT DETECTED Final   Streptococcus pyogenes NOT DETECTED NOT DETECTED Final   Acinetobacter baumannii NOT DETECTED NOT DETECTED Final   Enterobacteriaceae species NOT DETECTED NOT DETECTED Final   Enterobacter cloacae complex NOT  DETECTED NOT DETECTED Final    Escherichia coli NOT DETECTED NOT DETECTED Final   Klebsiella oxytoca NOT DETECTED NOT DETECTED Final   Klebsiella pneumoniae NOT DETECTED NOT DETECTED Final   Proteus species NOT DETECTED NOT DETECTED Final   Serratia marcescens NOT DETECTED NOT DETECTED Final   Haemophilus influenzae NOT DETECTED NOT DETECTED Final   Neisseria meningitidis NOT DETECTED NOT DETECTED Final   Pseudomonas aeruginosa NOT DETECTED NOT DETECTED Final   Candida albicans NOT DETECTED NOT DETECTED Final   Candida glabrata NOT DETECTED NOT DETECTED Final   Candida krusei NOT DETECTED NOT DETECTED Final   Candida parapsilosis NOT DETECTED NOT DETECTED Final   Candida tropicalis NOT DETECTED NOT DETECTED Final    Comment: Performed at [facility], [address] [place], [place] [address]  CULTURE, BLOOD (ROUTINE X 2) w Reflex to ID Panel     Status: None (Preliminary result)   Collection Time: 04/19/18 12:44 AM  Result Value Ref Range Status   Specimen Description BLOOD BLOOD LEFT HAND  Final   Special Requests   Final    BOTTLES DRAWN AEROBIC AND ANAEROBIC Blood Culture adequate volume   Culture   Final    NO GROWTH 2 DAYS Performed at [facility], [address] [place], [place] [address]    Report Status PENDING  Incomplete  CULTURE, BLOOD (ROUTINE X 2) w Reflex to ID Panel     Status: None (Preliminary result)   Collection Time: 04/19/18 12:46 AM  Result Value Ref Range Status   Specimen Description BLOOD BLOOD RIGHT HAND  Final   Special Requests   Final    BOTTLES DRAWN AEROBIC AND ANAEROBIC Blood Culture adequate volume   Culture   Final    NO GROWTH 2 DAYS Performed at [facility], [address], [place], [place] [address]    Report Status PENDING  Incomplete     Studies: Mr Thoracic Spine W Wo Contrast  Addendum Date: 04/20/2018   ADDENDUM REPORT: 04/20/2018 15:17 ADDENDUM: Study discussed by telephone with Dr. Posey Pronto on 04/20/2018 at at 1507 hours.  Electronically Signed   By: Genevie Ann M.D.   On: 04/20/2018 15:17   Result Date: 04/20/2018 CLINICAL DATA:  62 year old male with hepatitis C, IV drug use, back pain. Right upper extremity abscess. EXAM: MRI THORACIC WITHOUT AND WITH CONTRAST TECHNIQUE: Multiplanar and multiecho pulse sequences of the thoracic spine were obtained without and with intravenous contrast. CONTRAST:  6 milliliters Gadavist COMPARISON:  Cervical and thoracic spine MRI 02/14/2018. FINDINGS: Limited cervical spine imaging: Chronic degenerative endplate marrow signal changes throughout the cervical spine, but abnormal decreased T1 signal in the C7 and T1 vertebrae. Thoracic spine segmentation: Appears to be normal, the same numbering is used as on the 02/14/2018 comparison. Alignment:  Preserved thoracic vertebral alignment, kyphosis. Vertebrae: New abnormal confluent marrow edema and enhancement in the C7 and T1 vertebral bodies. There is bilateral pedicle involvement at both levels. Sagittal STIR images also suggest the possibility of C6 vertebral body involvement (series 21, image 11). Loss of the intervening C7-T1 disc space, and confluent enhancing prevertebral phlegmon are associated (series 39, image 13). The T1-T2 disc space is maintained. T2 and lower thoracic vertebral marrow signal remains normal. Cord: No thoracic spinal cord signal abnormality or abnormal intradural enhancement. There is limited detail of the lower cervical spinal cord, but that visible at C6-C7 on series 23, image 1 appears to remain normal. The conus medullaris appears stable and normal at  T12-L1. Paraspinal and other soft tissues: Enhancing prevertebral and lateral paraspinal phlegmon at C7-T1. No drainable abscess is identified at the cervicothoracic junction. Small to moderate bilateral layering pleural effusions. Posterior paraspinal soft tissues remain within normal limits. Disc levels: Abnormal at C7-T1 as stated above. The T1-T2 disc space remains  normal. The remaining thoracic levels are stable since August and negative. IMPRESSION: 1. 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 Questionable C6 involvement as well. Assuming this process does not spread caudally into other thoracic levels, Cervical Spine MRI without and with contrast will be the preferred modality for follow-up and surveillance. 2. Associated prevertebral phlegmon at C7 and T1. No drainable abscess is evident. 3. The T2 and lower thoracic levels remain normal. 4. Small to moderate bilateral layering pleural effusions. Electronically Signed: By: Genevie Ann M.D. On: 04/20/2018 14:46   Mr Liver W YT Contrast  Result Date: 04/20/2018 CLINICAL DATA:  Liver lesion. EXAM: MRI ABDOMEN WITHOUT AND WITH CONTRAST TECHNIQUE: Multiplanar multisequence MR imaging of the abdomen was performed both before and after the administration of intravenous contrast. CONTRAST:  6 cc Gadavist COMPARISON:  Chest CT 02/13/2018 FINDINGS: Lower chest: Bilateral pleural effusions. Hepatobiliary: Nodular hepatic contour is compatible with cirrhosis. Areas of parenchymal fibrosis noted within the liver. Lesion of concern on prior CT has increased in size in the interval measuring 4.0 x 3.5 cm in the medial segment left liver. This lesion has mildly increased signal on T1 weighted imaging and is nearly isointense to background liver on T2 weighted imaging. The lesion shows restricted diffusion. After IV contrast administration, there is early irregular but relatively diffuse mild hyperenhancement with a hypoenhancing rim. Central portion of the lesion washes out to a greater degree than background liver on delayed sequences with a persistent pseudo capsule (well seen coronal image 49/series 17). Imaging features are compatible with hepatocellular carcinoma (LI-RADS 5). Additional tiny foci of arterial phase hyperenhancement are identified measuring about 5 mm (2 on image 29/series 11 and 1 on image 50/series 11). No  definite washout can be demonstrated on more delayed imaging and these 3 lesions are compatible with category LI-RADS 3. Low signal in the gallbladder lumen may be related to stones/sludge. No substantial biliary dilatation. Pancreas: No focal mass lesion. No dilatation of the main duct. No intraparenchymal cyst. No peripancreatic edema. Spleen:  14.4 cm craniocaudal length, upper normal. Adrenals/Urinary Tract: No adrenal nodule or mass. Multiple cysts are identified in the kidneys with scarring visible in the upper pole of the left kidney. Stomach/Bowel: Stomach is nondistended. No gastric wall thickening. No evidence of outlet obstruction. Duodenum is normally positioned as is the ligament of Treitz. No small bowel or colonic dilatation within the visualized abdomen. Vascular/Lymphatic: No abdominal aortic aneurysm. Portal vein, superior mesenteric vein, and splenic vein are patent. Paraesophageal varices noted. Other:  Ascites is visible in the abdomen. Musculoskeletal: No abnormal marrow signal within the visualized bony anatomy. IMPRESSION: 1. 4.0 cm mass in the medial segment left liver meets imaging criteria for hepatocellular carcinoma (LI-RADS 5). 2. 3 additional tiny hypervascular foci in the liver measure 5 mm or less in size and are indeterminate based on imaging criteria (LI-RADS 3). 3. Cirrhotic liver morphology. 4. Paraesophageal varices are compatible with portal venous hypertension. 5. Bilateral pleural effusions with associated ascites. Electronically Signed   By: Misty Stanley M.D.   On: 04/20/2018 16:39    Scheduled Meds: . docusate sodium  100 mg Oral BID   Continuous Infusions: . 0.9 % NaCl with  KCl 20 mEq / L 100 mL/hr at 04/21/18 0455  . anidulafungin Stopped (04/20/18 1928)  . levofloxacin (LEVAQUIN) IV 750 mg (04/21/18 1306)  . vancomycin 1,250 mg (04/21/18 1309)    Assessment/Plan:  1. Clinical sepsis.  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  Appreciate infectious disease consultation and continue IV Anidulafungin, vancomycin and levaquin for right now.  TEE tomorrow 2. Liver mass consistent with hepatocellular cancer oncology has seen the patient outpatient follow-up once current clinical situation improved 3. 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   4. Back pain.  MRI of the thoracic spine secondary to back pain.  Shows discitis treatment as per #1 5. Anemia of chronic disease.  Patient given a transfusion 1 unit of packed red blood cells with good response.  Hemoglobin now up to 8.9. 6. Patient must stop IV drug use prognosis is now very poor in light of hepatocellular cancer  Code Status:     Code Status Orders  (From admission, onward)         Start     Ordered   04/17/18 1446  Full code  Continuous     04/17/18 1446        Code Status History    This patient has a current code status but no historical code status.      Disposition Plan: Patient will likely stay in the hospital for the entire course of IV antibiotics because I do not want to send a patient with history of IVDA a PICC line and access to his veins at home.  Antibiotics:  Anidalfungin  Vancomycin  Levaquin  Time spent: 28 minutes, case discussed with general surgery and cardiology about timing of TEE, ophthalmology.  Ameya Kutz [facility]

## 2018-04-21 NOTE — Progress Notes (Signed)
Advanced care plan.  Purpose of the Encounter: CODE STATUS  Parties in Attendance: Patient himself  Patient's Decision Capacity: Intact  Subjective/Patient's story:  Patient is a 62 year old admitted with sepsis related to yeast and stenotrophnomas now noted to have hepatocellular carcinoma.  I discussed with the patient regarding his desires for cardiac and pulmonary resuscitation in light of new diagnosis of liver cancer.  Objective/Medical story I asked this patient for his wishes regarding cardiac and pulmonary resuscitation   Goals of care determination:   Patient states that he will wants to be a full code  CODE STATUS: Full code   Time spent discussing advanced care planning: 16 minutes

## 2018-04-21 NOTE — Consult Note (Signed)
[facility]  [phone]) [phone] [phone]) [phone]  ID: Gray Bernhardt OB: [birth year]  MR#: [id number]  FUX#:[id number]  Patient Care Team: Patient, No Pcp Per as PCP - General (General Practice)  CHIEF COMPLAINT: Hepatocellular carcinoma  INTERVAL HISTORY: Patient is a 62 year old male recently admitted the hospital with sepsis and yeast and multiple bacteria noted in blood.  He is an active IV drug user as well has noted skin popping wounds on his arms.  Imaging revealed a liver mass consistent with hepatocellular carcinoma.  In the setting of hepatitis C as well as an AFP greater than 2000, this is pathognomonic.  Currently, patient feels well.  He offers no neurologic complaints.  He denies any fevers.  He does not complain of chest pain or shortness of breath.  He has no nausea, vomiting, constipation, or diarrhea.  He has no urinary complaints.  Patient offers no specific complaints today.  REVIEW OF SYSTEMS:   Review of Systems  Constitutional: Negative.  Negative for fever, malaise/fatigue and weight loss.  Respiratory: Negative.  Negative for cough, hemoptysis and shortness of breath.   Cardiovascular: Negative.  Negative for chest pain and leg swelling.  Gastrointestinal: Negative.  Negative for abdominal pain.  Genitourinary: Negative.  Negative for dysuria.  Musculoskeletal: Negative.  Negative for back pain.  Skin: Negative.   Neurological: Negative.  Negative for dizziness, focal weakness, weakness and headaches.  Psychiatric/Behavioral: Positive for substance abuse. The patient is not nervous/anxious.     As per HPI. Otherwise, a complete review of systems is negative.  PAST MEDICAL HISTORY: Past Medical History:  Diagnosis Date  . Hepatitis C   . Heroin use     PAST SURGICAL HISTORY: Past Surgical History:  Procedure Laterality Date  . KNEE SURGERY      FAMILY HISTORY: History reviewed. No pertinent family history.  ADVANCED  DIRECTIVES (Y/N):  @ADVDIR @  HEALTH MAINTENANCE: Social History   Tobacco Use  . Smoking status: Current Every Day Smoker  . Smokeless tobacco: Never Used  Substance Use Topics  . Alcohol use: Yes    Comment: 40 oz today  . Drug use: Yes    Types: IV, Cocaine    Comment: heroin, states he used to use cocaine     Colonoscopy:  PAP:  Bone density:  Lipid panel:  No Known Allergies  Current Facility-Administered Medications  Medication Dose Route Frequency Provider Last Rate Last Dose  . 0.9 % NaCl with KCl 20 mEq/ L  infusion   Intravenous Continuous Hillary Bow, MD 100 mL/hr at 04/21/18 0455    . acetaminophen (TYLENOL) tablet 650 mg  650 mg Oral Q6H PRN Hillary Bow, MD       Or  . acetaminophen (TYLENOL) suppository 650 mg  650 mg Rectal Q6H PRN Sudini, Srikar, MD      . albuterol (PROVENTIL) (2.5 MG/3ML) 0.083% nebulizer solution 2.5 mg  2.5 mg Nebulization Q2H PRN Sudini, Srikar, MD      . anidulafungin (ERAXIS) 100 mg in sodium chloride 0.9 % 100 mL IVPB  100 mg Intravenous Q24H Loletha Grayer, MD   Stopped at 04/20/18 1928  . docusate sodium (COLACE) capsule 100 mg  100 mg Oral BID Hillary Bow, MD   100 mg at 04/20/18 0934  . gadobutrol (GADAVIST) 1 MMOL/ML injection 6 mL  6 mL Intravenous Once PRN Loletha Grayer, MD   Stopped at 04/20/18 1427  . ketorolac (TORADOL) 30 MG/ML injection 30 mg  30 mg Intravenous Q6H PRN Sudini,  Srikar, MD   30 mg at 04/21/18 0618  . levofloxacin (LEVAQUIN) IVPB 750 mg  750 mg Intravenous Q24H Loletha Grayer, MD   Stopped at 04/20/18 1542  . ondansetron (ZOFRAN) tablet 4 mg  4 mg Oral Q6H PRN Hillary Bow, MD       Or  . ondansetron (ZOFRAN) injection 4 mg  4 mg Intravenous Q6H PRN Hillary Bow, MD   4 mg at 04/18/18 2310  . oxyCODONE (Oxy IR/ROXICODONE) immediate release tablet 5 mg  5 mg Oral Q4H PRN Hillary Bow, MD   5 mg at 04/20/18 2232  . polyethylene glycol (MIRALAX / GLYCOLAX) packet 17 g  17 g Oral Daily PRN  Sudini, Alveta Heimlich, MD      . traZODone (DESYREL) tablet 50 mg  50 mg Oral QHS PRN Lance Coon, MD   50 mg at 04/20/18 2232  . vancomycin (VANCOCIN) 1,250 mg in sodium chloride 0.9 % 250 mL IVPB  1,250 mg Intravenous Q12H Loletha Grayer, MD   Stopped at 04/21/18 0013    OBJECTIVE: Vitals:   04/20/18 1931 04/21/18 0449  BP: (!) 104/59 (!) 142/62  Pulse: 62 (!) 55  Resp: 18 18  Temp: 98.5 F (36.9 C) 98.2 F (36.8 C)  SpO2: 99% 98%     Body mass index is 22.44 kg/m.    ECOG FS:0 - Asymptomatic  General: Thin, no acute distress. Eyes: Pink conjunctiva, anicteric sclera. HEENT: Normocephalic, moist mucous membranes, clear oropharnyx. Lungs: Clear to auscultation bilaterally. Heart: Regular rate and rhythm. No rubs, murmurs, or gallops. Abdomen: Soft, nontender, nondistended. No organomegaly noted, normoactive bowel sounds. Musculoskeletal: No edema, cyanosis, or clubbing. Neuro: Alert, answering all questions appropriately. Cranial nerves grossly intact. Skin: No rashes or petechiae noted.  Skin popping wounds on bilateral arms. Psych: Normal affect.    LAB RESULTS:  Lab Results  Component Value Date   NA 136 04/21/2018   K 3.7 04/21/2018   CL 115 (H) 04/21/2018   CO2 20 (L) 04/21/2018   GLUCOSE 104 (H) 04/21/2018   BUN 11 04/21/2018   CREATININE 0.56 (L) 04/21/2018   CALCIUM 7.6 (L) 04/21/2018   PROT 7.3 04/17/2018   ALBUMIN 1.8 (L) 04/17/2018   AST 36 04/17/2018   ALT 9 04/17/2018   ALKPHOS 142 (H) 04/17/2018   BILITOT 1.5 (H) 04/17/2018   GFRNONAA >60 04/21/2018   GFRAA >60 04/21/2018    Lab Results  Component Value Date   WBC 4.5 04/21/2018   NEUTROABS 3.2 04/17/2018   HGB 10.3 (L) 04/21/2018   HCT 30.5 (L) 04/21/2018   MCV 99.4 04/21/2018   PLT 48 (L) 04/21/2018     STUDIES: Mr Thoracic Spine W Wo Contrast  Addendum Date: 04/20/2018   ADDENDUM REPORT: 04/20/2018 15:17 ADDENDUM: Study discussed by telephone with Dr. Posey Pronto on 04/20/2018 at at 1507  hours. Electronically Signed   By: Genevie Ann M.D.   On: 04/20/2018 15:17   Result Date: 04/20/2018 CLINICAL DATA:  62 year old male with hepatitis C, IV drug use, back pain. Right upper extremity abscess. EXAM: MRI THORACIC WITHOUT AND WITH CONTRAST TECHNIQUE: Multiplanar and multiecho pulse sequences of the thoracic spine were obtained without and with intravenous contrast. CONTRAST:  6 milliliters Gadavist COMPARISON:  Cervical and thoracic spine MRI 02/14/2018. FINDINGS: Limited cervical spine imaging: Chronic degenerative endplate marrow signal changes throughout the cervical spine, but abnormal decreased T1 signal in the C7 and T1 vertebrae. Thoracic spine segmentation: Appears to be normal, the same numbering is used as on  the 02/14/2018 comparison. Alignment:  Preserved thoracic vertebral alignment, kyphosis. Vertebrae: New abnormal confluent marrow edema and enhancement in the C7 and T1 vertebral bodies. There is bilateral pedicle involvement at both levels. Sagittal STIR images also suggest the possibility of C6 vertebral body involvement (series 21, image 11). Loss of the intervening C7-T1 disc space, and confluent enhancing prevertebral phlegmon are associated (series 39, image 13). The T1-T2 disc space is maintained. T2 and lower thoracic vertebral marrow signal remains normal. Cord: No thoracic spinal cord signal abnormality or abnormal intradural enhancement. There is limited detail of the lower cervical spinal cord, but that visible at C6-C7 on series 23, image 1 appears to remain normal. The conus medullaris appears stable and normal at T12-L1. Paraspinal and other soft tissues: Enhancing prevertebral and lateral paraspinal phlegmon at C7-T1. No drainable abscess is identified at the cervicothoracic junction. Small to moderate bilateral layering pleural effusions. Posterior paraspinal soft tissues remain within normal limits. Disc levels: Abnormal at C7-T1 as stated above. The T1-T2 disc space  remains normal. The remaining thoracic levels are stable since August and negative. IMPRESSION: 1. 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 Questionable C6 involvement as well. Assuming this process does not spread caudally into other thoracic levels, Cervical Spine MRI without and with contrast will be the preferred modality for follow-up and surveillance. 2. Associated prevertebral phlegmon at C7 and T1. No drainable abscess is evident. 3. The T2 and lower thoracic levels remain normal. 4. Small to moderate bilateral layering pleural effusions. Electronically Signed: By: Genevie Ann M.D. On: 04/20/2018 14:46   Mr Liver W NW Contrast  Result Date: 04/20/2018 CLINICAL DATA:  Liver lesion. EXAM: MRI ABDOMEN WITHOUT AND WITH CONTRAST TECHNIQUE: Multiplanar multisequence MR imaging of the abdomen was performed both before and after the administration of intravenous contrast. CONTRAST:  6 cc Gadavist COMPARISON:  Chest CT 02/13/2018 FINDINGS: Lower chest: Bilateral pleural effusions. Hepatobiliary: Nodular hepatic contour is compatible with cirrhosis. Areas of parenchymal fibrosis noted within the liver. Lesion of concern on prior CT has increased in size in the interval measuring 4.0 x 3.5 cm in the medial segment left liver. This lesion has mildly increased signal on T1 weighted imaging and is nearly isointense to background liver on T2 weighted imaging. The lesion shows restricted diffusion. After IV contrast administration, there is early irregular but relatively diffuse mild hyperenhancement with a hypoenhancing rim. Central portion of the lesion washes out to a greater degree than background liver on delayed sequences with a persistent pseudo capsule (well seen coronal image 49/series 17). Imaging features are compatible with hepatocellular carcinoma (LI-RADS 5). Additional tiny foci of arterial phase hyperenhancement are identified measuring about 5 mm (2 on image 29/series 11 and 1 on image 50/series 11).  No definite washout can be demonstrated on more delayed imaging and these 3 lesions are compatible with category LI-RADS 3. Low signal in the gallbladder lumen may be related to stones/sludge. No substantial biliary dilatation. Pancreas: No focal mass lesion. No dilatation of the main duct. No intraparenchymal cyst. No peripancreatic edema. Spleen:  14.4 cm craniocaudal length, upper normal. Adrenals/Urinary Tract: No adrenal nodule or mass. Multiple cysts are identified in the kidneys with scarring visible in the upper pole of the left kidney. Stomach/Bowel: Stomach is nondistended. No gastric wall thickening. No evidence of outlet obstruction. Duodenum is normally positioned as is the ligament of Treitz. No small bowel or colonic dilatation within the visualized abdomen. Vascular/Lymphatic: No abdominal aortic aneurysm. Portal vein, superior mesenteric vein, and  splenic vein are patent. Paraesophageal varices noted. Other:  Ascites is visible in the abdomen. Musculoskeletal: No abnormal marrow signal within the visualized bony anatomy. IMPRESSION: 1. 4.0 cm mass in the medial segment left liver meets imaging criteria for hepatocellular carcinoma (LI-RADS 5). 2. 3 additional tiny hypervascular foci in the liver measure 5 mm or less in size and are indeterminate based on imaging criteria (LI-RADS 3). 3. Cirrhotic liver morphology. 4. Paraesophageal varices are compatible with portal venous hypertension. 5. Bilateral pleural effusions with associated ascites. Electronically Signed   By: Misty Stanley M.D.   On: 04/20/2018 16:39   Dg Chest Port 1 View  Result Date: 04/17/2018 CLINICAL DATA:  Acute lower abdominal pain. EXAM: PORTABLE CHEST 1 VIEW COMPARISON:  Radiographs of February 13, 2018. FINDINGS: The heart size and mediastinal contours are within normal limits. Both lungs are clear. No pneumothorax or pleural effusion is noted. The visualized skeletal structures are unremarkable. IMPRESSION: No acute  cardiopulmonary abnormality seen. Electronically Signed   By: Marijo Conception, M.D.   On: 04/17/2018 12:47    ASSESSMENT: Hepatocellular carcinoma.  PLAN:    1.  Hepatocellular carcinoma: In the setting of hepatitis C and cirrhosis, a 4 cm mass in the left segment of liver as well as an AFP of greater than 2000 is pathognomonic for hepatocellular carcinoma.  No biopsy is necessary.  Can consider ablation in the future if patient's acute issues resolve and he discontinues IV drug use.  Any systemic therapy is not an option at this point.  Please have patient follow-up in the cancer center in 1 to 2 weeks after discharge from the hospital for further evaluation and discuss treatment planning if possible.  Appreciate consult, call with questions.    Lloyd Huger, MD   04/21/2018 11:49 AM

## 2018-04-22 ENCOUNTER — Inpatient Hospital Stay: Payer: Medicare Other

## 2018-04-22 ENCOUNTER — Encounter: Payer: Self-pay | Admitting: Cardiology

## 2018-04-22 ENCOUNTER — Inpatient Hospital Stay: Payer: Medicare Other | Admitting: Anesthesiology

## 2018-04-22 ENCOUNTER — Encounter
Admission: EM | Disposition: A | Discharge status: Home Health | Payer: Self-pay | Source: Home / Self Care | Attending: Internal Medicine

## 2018-04-22 ENCOUNTER — Inpatient Hospital Stay
Admit: 2018-04-22 | Discharge: 2018-04-22 | Disposition: A | Discharge status: Home / Self Care | Payer: Medicare Other | Attending: Cardiology | Admitting: Cardiology

## 2018-04-22 HISTORY — PX: TEE WITHOUT CARDIOVERSION: SHX5443

## 2018-04-22 LAB — ECHOCARDIOGRAM COMPLETE
Height: 67 in
Weight: 2225.76 oz

## 2018-04-22 SURGERY — ECHOCARDIOGRAM, TRANSESOPHAGEAL
Anesthesia: General

## 2018-04-22 MED ORDER — SODIUM CHLORIDE 0.9 % IV SOLN: INTRAVENOUS | Status: DC

## 2018-04-22 MED ORDER — ADULT MULTIVITAMIN W/MINERALS CH
1.0000 | ORAL_TABLET | Freq: Every day | ORAL | Status: DC
  Administered 2018-04-23 – 2018-05-02 (×10): 1 via ORAL
  Filled 2018-04-22 (×10): qty 1

## 2018-04-22 MED ORDER — PROPOFOL 10 MG/ML IV BOLUS
INTRAVENOUS | Status: DC | PRN
  Administered 2018-04-22: 40 mg via INTRAVENOUS
  Administered 2018-04-22: 30 mg via INTRAVENOUS
  Administered 2018-04-22: 70 mg via INTRAVENOUS

## 2018-04-22 MED ORDER — ENSURE ENLIVE PO LIQD
237.0000 mL | Freq: Two times a day (BID) | ORAL | Status: DC
  Administered 2018-04-25 – 2018-05-02 (×13): 237 mL via ORAL

## 2018-04-22 MED ORDER — LORAZEPAM 2 MG/ML IJ SOLN
1.0000 mg | INTRAMUSCULAR | Status: DC | PRN
  Administered 2018-04-22: 1 mg via INTRAVENOUS
  Filled 2018-04-22: qty 1

## 2018-04-22 NOTE — Transfer of Care (Signed)
Immediate Anesthesia Transfer of Care Note  Patient: Andrew Gibbs  Procedure(s) Performed: TRANSESOPHAGEAL ECHOCARDIOGRAM (TEE) (N/A )  Patient Location: PACU  Anesthesia Type:General  Level of Consciousness: sedated  Airway & Oxygen Therapy: Patient Spontanous Breathing and Patient connected to nasal cannula oxygen  Post-op Assessment: Report given to RN and Post -op Vital signs reviewed and stable  Post vital signs: Reviewed and stable  Last Vitals:  Vitals Value Taken Time  BP 132/64 04/22/2018 12:18 PM  Temp    Pulse 70 04/22/2018 12:21 PM  Resp 27 04/22/2018 12:20 PM  SpO2 92 % 04/22/2018 12:21 PM    Last Pain:  Vitals:   04/22/18 1156  TempSrc: Oral  PainSc: 0-No pain      Patients Stated Pain Goal: 0 (72/07/21 8288)  Complications: No apparent anesthesia complications

## 2018-04-22 NOTE — Care Management Important Message (Signed)
Copy of signed IM left in patient's room (out for procedure). 

## 2018-04-22 NOTE — Progress Notes (Signed)
Patient was coughing up blood after procedure. Dr. Ubaldo Glassing aware. Approximately 75cc blood suctioned. Patient to get a barium swallow before returning to room. Patient stopped coughing and no further bleeding. Dr. Ubaldo Glassing notified but will continue with barium swallow.

## 2018-04-22 NOTE — Progress Notes (Signed)
*  PRELIMINARY RESULTS* Echocardiogram Echocardiogram Transesophageal has been performed.  Andrew Gibbs 04/22/2018, 12:41 PM

## 2018-04-22 NOTE — Anesthesia Post-op Follow-up Note (Signed)
Anesthesia [facility] form completed.        

## 2018-04-22 NOTE — Anesthesia Postprocedure Evaluation (Signed)
Anesthesia Post Note  Patient: Andrew Gibbs  Procedure(s) Performed: TRANSESOPHAGEAL ECHOCARDIOGRAM (TEE) (N/A )  Patient location during evaluation: Other (specials recovery) Anesthesia Type: General Level of consciousness: awake and alert and oriented Pain management: pain level controlled Vital Signs Assessment: post-procedure vital signs reviewed and stable Respiratory status: spontaneous breathing, nonlabored ventilation and respiratory function stable Cardiovascular status: blood pressure returned to baseline and stable Postop Assessment: no signs of nausea or vomiting Anesthetic complications: no     Last Vitals:  Vitals:   04/22/18 1245 04/22/18 1300  BP: (!) 147/65 (!) 155/68  Pulse: (!) 53 (!) 52  Resp: 16 18  Temp:    SpO2: 98% 99%    Last Pain:  Vitals:   04/22/18 1344  TempSrc:   PainSc: 8                  Milam Allbaugh

## 2018-04-22 NOTE — Progress Notes (Addendum)
Patient ID: Andrew Gibbs, male   DOB: [birth year], 62 y.o.   MRN: [id number]  [employer] PROGRESS NOTE  Andrew Gibbs:[id number] DOB: Mar 31, 1956 DOA: 04/17/2018 PCP: Patient, No Pcp Per  HPI/Subjective: Patient wants to eat  Objective: Vitals:   04/22/18 1245 04/22/18 1300  BP: (!) 147/65 (!) 155/68  Pulse: (!) 53 (!) 52  Resp: 16 18  Temp:    SpO2: 98% 99%    Filed Weights   04/22/18 0300 04/22/18 0500 04/22/18 0758  Weight: 66.3 kg 66.3 kg 66.3 kg    ROS: Review of Systems  Constitutional: Negative for chills and fever.  Eyes: Negative for blurred vision.  Respiratory: Negative for cough and shortness of breath.   Cardiovascular: Negative for chest pain.  Gastrointestinal: Negative for abdominal pain, constipation, diarrhea, nausea and vomiting.  Genitourinary: Negative for dysuria.  Musculoskeletal: Negative for back pain, joint pain and myalgias.  Neurological: Negative for dizziness and headaches.   Exam: Physical Exam  HENT:  Nose: No mucosal edema.  Mouth/Throat: No oropharyngeal exudate or posterior oropharyngeal edema.  Eyes: Pupils are equal, round, and reactive to light. Conjunctivae, EOM and lids are normal.  Neck: No JVD present. Carotid bruit is not present. No edema present. No thyroid mass and no thyromegaly present.  Cardiovascular: S1 normal and S2 normal. Exam reveals no gallop.  No murmur heard. Pulses:      Dorsalis pedis pulses are 2+ on the right side, and 2+ on the left side.  Respiratory: No respiratory distress. He has no wheezes. He has no rhonchi. He has no rales.  GI: Soft. Bowel sounds are normal. There is no tenderness.  Musculoskeletal:       Right elbow: He exhibits swelling.       Left elbow: He exhibits swelling.       Right ankle: He exhibits no swelling.       Left ankle: He exhibits no swelling.  Lymphadenopathy:    He has no cervical adenopathy.  Neurological: He is alert. No cranial nerve deficit.  Skin:  Skin is warm. Nails show no clubbing.  Numerous areas of small little furuncles on bilateral arms.  On the right arm he does have an open tract.  I do not see any specific abscess.  Psychiatric: He has a normal mood and affect.      Data Reviewed: Basic Metabolic Panel: Recent Labs  Lab 04/17/18 1233 04/18/18 0543 04/21/18 0347  NA 139 139 136  K 3.6 3.3* 3.7  CL 109 113* 115*  CO2 21* 20* 20*  GLUCOSE 105* 91 104*  BUN 9 8 11   CREATININE 0.68 0.50* 0.56*  CALCIUM 7.8* 7.4* 7.6*   Liver Function Tests: Recent Labs  Lab 04/17/18 1233  AST 36  ALT 9  ALKPHOS 142*  BILITOT 1.5*  PROT 7.3  ALBUMIN 1.8*   Recent Labs  Lab 04/17/18 1233  LIPASE 40   CBC: Recent Labs  Lab 04/17/18 1233 04/17/18 2350 04/18/18 0543 04/21/18 0347  WBC 3.7*  --  6.2 4.5  NEUTROABS 3.2  --   --   --   HGB 6.3* 8.7* 8.9* 10.3*  HCT 18.6*  --  25.0* 30.5*  MCV 105.9*  --  100.7* 99.4  PLT 56*  --  40* 48*   Cardiac Enzymes: Recent Labs  Lab 04/17/18 1233  TROPONINI <0.03     Recent Results (from the past 240 hour(s))  Urine culture     Status: None  Collection Time: 04/17/18 12:20 PM  Result Value Ref Range Status   Specimen Description   Final    URINE, RANDOM Performed at [facility], [address], [place], [place] [address]    Special Requests   Final    NONE Performed at [facility], [address], [place], [place] [address]    Culture   Final    NO GROWTH Performed at [facility], [place] [address], [place], [place] [address]    Report Status 04/19/2018 FINAL  Final  Blood Culture (routine x 2)     Status: Abnormal   Collection Time: 04/17/18 12:33 PM  Result Value Ref Range Status   Specimen Description   Final    BLOOD RIGHT HAND Performed at [facility], [address], [place], [place] [address]    Special Requests   Final    BOTTLES DRAWN AEROBIC AND ANAEROBIC Blood Culture adequate volume Performed at [facility], [address] [place], [place] [address]    Culture  Setup Time   Final    GRAM NEGATIVE RODS YEAST IN BOTH AEROBIC AND ANAEROBIC BOTTLES CRITICAL VALUE NOTED.  VALUE IS CONSISTENT WITH PREVIOUSLY REPORTED AND CALLED VALUE. Performed at [facility], [address] [place], [place] [address]    Culture (A)  Final    FLAVOBACTERIUM INDOLOGENES CANDIDA LUSITANIAE STENOTROPHOMONAS MALTOPHILIA SUSCEPTIBILITIES PERFORMED ON PREVIOUS CULTURE WITHIN THE LAST 5 DAYS. Performed at [facility], [place] [address], [place], [place] [address]    Report Status 04/22/2018 FINAL  Final  Wound or Superficial Culture     Status: None   Collection Time: 04/17/18 12:33 PM  Result Value Ref Range Status   Specimen Description   Final    ARM RIGHT UPPER ARM Performed at [facility], [address] [place], [place] [address]    Special Requests   Final    NONE Performed at [facility], [place], [place] [address]    Gram Stain   Final    NO WBC SEEN FEW GRAM POSITIVE COCCI FEW GRAM NEGATIVE RODS RARE GRAM POSITIVE RODS Performed at [facility], [place] [address] [place],  [address]    Culture   Final    FEW STAPHYLOCOCCUS AUREUS FEW SERRATIA MARCESCENS    Report Status 04/21/2018 FINAL  Final   Organism ID, Bacteria SERRATIA MARCESCENS  Final   Organism ID, Bacteria STAPHYLOCOCCUS AUREUS  Final      Susceptibility   Staphylococcus aureus - MIC*    CIPROFLOXACIN <=0.5 SENSITIVE Sensitive     ERYTHROMYCIN <=0.25 SENSITIVE Sensitive     GENTAMICIN <=0.5 SENSITIVE Sensitive     OXACILLIN <=0.25 SENSITIVE Sensitive     TETRACYCLINE <=1 SENSITIVE Sensitive     VANCOMYCIN 1 SENSITIVE Sensitive     TRIMETH/SULFA <=10 SENSITIVE Sensitive     CLINDAMYCIN <=0.25 SENSITIVE Sensitive     RIFAMPIN <=0.5 SENSITIVE Sensitive     Inducible Clindamycin NEGATIVE Sensitive     * FEW STAPHYLOCOCCUS AUREUS   Serratia  marcescens - MIC*    CEFAZOLIN >=64 RESISTANT Resistant     CEFEPIME <=1 SENSITIVE Sensitive     CEFTAZIDIME <=1 SENSITIVE Sensitive     CEFTRIAXONE <=1 SENSITIVE Sensitive     CIPROFLOXACIN <=0.25 SENSITIVE Sensitive     GENTAMICIN <=1 SENSITIVE Sensitive     TRIMETH/SULFA <=20 SENSITIVE Sensitive     * FEW SERRATIA MARCESCENS  Blood Culture (  routine x 2)     Status: Abnormal   Collection Time: 04/17/18  1:05 PM  Result Value Ref Range Status   Specimen Description   Final    BLOOD LEFT ARM Performed at [facility], [address] [place], [place] [address]    Special Requests   Final    BOTTLES DRAWN AEROBIC AND ANAEROBIC Blood Culture adequate volume Performed at [facility], [address] [place], [place] [address]    Culture  Setup Time   Final    YEAST AEROBIC BOTTLE ONLY CRITICAL RESULT CALLED TO, READ BACK BY AND VERIFIED WITH: JASON ROBBINS AT 9381 ON 04/18/18 [place]. GRAM NEGATIVE RODS ANAEROBIC BOTTLE ONLY CRITICAL RESULT CALLED TO, READ BACK BY AND VERIFIED WITH: [place] @1917  04/18/18 AKT Performed at [facility], [place] [address], [place], [place] [address]    Culture CANDIDA LUSITANIAE STENOTROPHOMONAS MALTOPHILIA  (A)  Final   Report Status 04/21/2018 FINAL  Final   Organism ID, Bacteria STENOTROPHOMONAS MALTOPHILIA  Final      Susceptibility   Stenotrophomonas maltophilia - MIC*    LEVOFLOXACIN 0.5 SENSITIVE Sensitive     TRIMETH/SULFA <=20 SENSITIVE Sensitive     * STENOTROPHOMONAS MALTOPHILIA  Blood Culture ID Panel (Reflexed)     Status: None   Collection Time: 04/17/18  1:05 PM  Result Value Ref Range Status   Enterococcus species NOT DETECTED NOT DETECTED Final   Listeria monocytogenes NOT DETECTED NOT DETECTED Final   Staphylococcus species NOT DETECTED NOT DETECTED Final   Staphylococcus aureus (BCID) NOT DETECTED NOT DETECTED Final   Streptococcus species NOT DETECTED NOT DETECTED Final   Streptococcus agalactiae  NOT DETECTED NOT DETECTED Final   Streptococcus pneumoniae NOT DETECTED NOT DETECTED Final   Streptococcus pyogenes NOT DETECTED NOT DETECTED Final   Acinetobacter baumannii NOT DETECTED NOT DETECTED Final   Enterobacteriaceae species NOT DETECTED NOT DETECTED Final   Enterobacter cloacae complex NOT DETECTED NOT DETECTED Final   Escherichia coli NOT DETECTED NOT DETECTED Final   Klebsiella oxytoca NOT DETECTED NOT DETECTED Final   Klebsiella pneumoniae NOT DETECTED NOT DETECTED Final   Proteus species NOT DETECTED NOT DETECTED Final   Serratia marcescens NOT DETECTED NOT DETECTED Final   Haemophilus influenzae NOT DETECTED NOT DETECTED Final   Neisseria meningitidis NOT DETECTED NOT DETECTED Final   Pseudomonas aeruginosa NOT DETECTED NOT DETECTED Final   Candida albicans NOT DETECTED NOT DETECTED Final   Candida glabrata NOT DETECTED NOT DETECTED Final   Candida krusei NOT DETECTED NOT DETECTED Final   Candida parapsilosis NOT DETECTED NOT DETECTED Final   Candida tropicalis NOT DETECTED NOT DETECTED Final    Comment: Performed at [facility], [address] [place], [place] [address]  Blood Culture ID Panel (Reflexed)     Status: None   Collection Time: 04/17/18  3:04 PM  Result Value Ref Range Status   Enterococcus species NOT DETECTED NOT DETECTED Final   Listeria monocytogenes NOT DETECTED NOT DETECTED Final   Staphylococcus species NOT DETECTED NOT DETECTED Final   Staphylococcus aureus (BCID) NOT DETECTED NOT DETECTED Final   Streptococcus species NOT DETECTED NOT DETECTED Final   Streptococcus agalactiae NOT DETECTED NOT DETECTED Final   Streptococcus pneumoniae NOT DETECTED NOT DETECTED Final   Streptococcus pyogenes NOT DETECTED NOT DETECTED Final   Acinetobacter baumannii NOT DETECTED NOT DETECTED Final   Enterobacteriaceae species NOT DETECTED NOT DETECTED Final   Enterobacter cloacae complex NOT DETECTED NOT DETECTED Final  Escherichia coli NOT DETECTED  NOT DETECTED Final   Klebsiella oxytoca NOT DETECTED NOT DETECTED Final   Klebsiella pneumoniae NOT DETECTED NOT DETECTED Final   Proteus species NOT DETECTED NOT DETECTED Final   Serratia marcescens NOT DETECTED NOT DETECTED Final   Haemophilus influenzae NOT DETECTED NOT DETECTED Final   Neisseria meningitidis NOT DETECTED NOT DETECTED Final   Pseudomonas aeruginosa NOT DETECTED NOT DETECTED Final   Candida albicans NOT DETECTED NOT DETECTED Final   Candida glabrata NOT DETECTED NOT DETECTED Final   Candida krusei NOT DETECTED NOT DETECTED Final   Candida parapsilosis NOT DETECTED NOT DETECTED Final   Candida tropicalis NOT DETECTED NOT DETECTED Final    Comment: Performed at [facility], [address] [place], [place] [address]  CULTURE, BLOOD (ROUTINE X 2) w Reflex to ID Panel     Status: None (Preliminary result)   Collection Time: 04/19/18 12:44 AM  Result Value Ref Range Status   Specimen Description BLOOD BLOOD LEFT HAND  Final   Special Requests   Final    BOTTLES DRAWN AEROBIC AND ANAEROBIC Blood Culture adequate volume   Culture   Final    NO GROWTH 3 DAYS Performed at [facility], [address], [place], [place] [address]    Report Status PENDING  Incomplete  CULTURE, BLOOD (ROUTINE X 2) w Reflex to ID Panel     Status: None (Preliminary result)   Collection Time: 04/19/18 12:46 AM  Result Value Ref Range Status   Specimen Description BLOOD BLOOD RIGHT HAND  Final   Special Requests   Final    BOTTLES DRAWN AEROBIC AND ANAEROBIC Blood Culture adequate volume   Culture   Final    NO GROWTH 3 DAYS Performed at [facility], [address] [place],  [address]    Report Status PENDING  Incomplete     Studies: No results found.  Scheduled Meds: . [MAR Hold] docusate sodium  100 mg Oral BID  . [MAR Hold] feeding supplement (ENSURE ENLIVE)  237 mL Oral BID BM  . [MAR Hold] multivitamin with minerals  1 tablet Oral Daily    Continuous Infusions: . 0.9 % NaCl with KCl 20 mEq / L 100 mL/hr at 04/22/18 0419  . [MAR Hold] anidulafungin Stopped (04/21/18 1753)  . [MAR Hold] levofloxacin (LEVAQUIN) IV Stopped (04/21/18 1432)  . [MAR Hold] vancomycin 1,000 mg (04/22/18 0601)    Assessment/Plan:  1. Clinical sepsis.  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 Appreciate infectious disease consultation and continue IV Anidulafungin, vancomycin and levaquin for right now.  TEE shows small endocarditis 2. Liver mass consistent with hepatocellular cancer oncology has seen the patient outpatient follow-up once current clinical situation improved 3. 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   4. Back pain.  MRI of the thoracic spine secondary to back pain.  Shows discitis treatment as per #1 5. Anemia of chronic disease.  Patient given a transfusion 1 unit of packed red blood cells with good response.  Hemoglobin now up to 8.9. 6. Patient must stop IV drug use prognosis is now very poor in light of hepatocellular cancer  Code Status:     Code Status Orders  (From admission, onward)         Start     Ordered   04/17/18 1446  Full code  Continuous     04/17/18 1446        Code Status History  This patient has a current code status but no historical code status.      Disposition Plan: Patient will likely stay in the hospital for the entire course of IV antibiotics because I do not want to send a patient with history of IVDA a PICC line and access to his veins at home.  Antibiotics:  Anidalfungin  Vancomycin  Levaquin  Time spent: 28 minutes, [place]

## 2018-04-22 NOTE — Progress Notes (Signed)
*  PRELIMINARY RESULTS* Echocardiogram 2D Echocardiogram has been performed.  Andrew Gibbs 04/22/2018, 12:40 PM

## 2018-04-22 NOTE — CV Procedure (Signed)
   TRANSESOPHAGEAL ECHOCARDIOGRAM   NAME:  Andrew Gibbs   MRN: [id number] DOB:  Dec 02, 1955   ADMIT DATE: 04/17/2018  INDICATIONS:   PROCEDURE:   Informed consent was obtained prior to the procedure. The risks, benefits and alternatives for the procedure were discussed and the patient comprehended these risks.  Risks include, but are not limited to, cough, sore throat, vomiting, nausea, somnolence, esophageal and stomach trauma or perforation, bleeding, low blood pressure, aspiration, pneumonia, infection, trauma to the teeth and death.    After a procedural time-out, the patient was given propofol per dept of anesthesia. .  The transesophageal probe was inserted in the esophagus and stomach without difficulty and multiple views were obtained. I was present for the entire procedure.    COMPLICATIONS:    There were no immediate complications.  FINDINGS:  LEFT VENTRICLE: EF = 55%. No regional wall motion abnormalities.  RIGHT VENTRICLE: Normal size and function.   LEFT ATRIUM: normal size. No clot in appendage LEFT ATRIAL APPENDAGE: No thrombus.   RIGHT ATRIUM: mild enlargement  AORTIC VALVE:  Trileaflet. Probable small vegetation in aortic valve.   MITRAL VALVE:    Normal.  TRICUSPID VALVE: Normal.  PULMONIC VALVE: Grossly normal.  INTERATRIAL SEPTUM: No PFO or ASD. Agitated saline contrast was used.   PERICARDIUM: No effusion  DESCENDING AORTA:   CONCLUSION: Possible small aortic valve vegetation. Trivial ai.  COmpliacted to esophageal bleeding. Barium swallow.

## 2018-04-22 NOTE — Anesthesia Preprocedure Evaluation (Signed)
Anesthesia Evaluation  Patient identified by MRN, date of birth, ID band Patient awake    Reviewed: Allergy & Precautions, NPO status , Patient's Chart, lab work & pertinent test results  History of Anesthesia Complications Negative for: history of anesthetic complications  Airway Mallampati: II  TM Distance: >3 FB Neck ROM: Full    Dental  (+) Poor Dentition   Pulmonary neg sleep apnea, neg COPD, Current Smoker,    breath sounds clear to auscultation- rhonchi (-) wheezing      Cardiovascular Exercise Tolerance: Good (-) hypertension(-) CAD, (-) Past MI, (-) Cardiac Stents and (-) CABG  Rhythm:Regular Rate:Normal - Systolic murmurs and - Diastolic murmurs    Neuro/Psych negative neurological ROS  negative psych ROS   GI/Hepatic negative GI ROS, (+) Hepatitis -, C  Endo/Other  negative endocrine ROS  Renal/GU negative Renal ROS     Musculoskeletal negative musculoskeletal ROS (+)   Abdominal (+) - obese,   Peds  Hematology negative hematology ROS (+)   Anesthesia Other Findings Past Medical History: No date: Hepatitis C No date: Heroin use   Reproductive/Obstetrics                             Anesthesia Physical Anesthesia Plan  ASA: II  Anesthesia Plan: General   Post-op Pain Management:    Induction: Intravenous  PONV Risk Score and Plan: 0 and Propofol infusion  Airway Management Planned: Natural Airway  Additional Equipment:   Intra-op Plan:   Post-operative Plan:   Informed Consent: I have reviewed the patients History and Physical, chart, labs and discussed the procedure including the risks, benefits and alternatives for the proposed anesthesia with the patient or authorized representative who has indicated his/her understanding and acceptance.   Dental advisory given  Plan Discussed with: CRNA and Anesthesiologist  Anesthesia Plan Comments:          Anesthesia Quick Evaluation

## 2018-04-23 DIAGNOSIS — B372 Candidiasis of skin and nail: Secondary | ICD-10-CM

## 2018-04-23 DIAGNOSIS — M4643 Discitis, unspecified, cervicothoracic region: Secondary | ICD-10-CM

## 2018-04-23 DIAGNOSIS — Z9889 Other specified postprocedural states: Secondary | ICD-10-CM

## 2018-04-23 DIAGNOSIS — I358 Other nonrheumatic aortic valve disorders: Secondary | ICD-10-CM

## 2018-04-23 DIAGNOSIS — C229 Malignant neoplasm of liver, not specified as primary or secondary: Secondary | ICD-10-CM

## 2018-04-23 DIAGNOSIS — M4623 Osteomyelitis of vertebra, cervicothoracic region: Secondary | ICD-10-CM

## 2018-04-23 DIAGNOSIS — K0889 Other specified disorders of teeth and supporting structures: Secondary | ICD-10-CM

## 2018-04-23 LAB — VANCOMYCIN, TROUGH: Vancomycin Tr: 16 ug/mL (ref 15–20)

## 2018-04-23 MED ORDER — SODIUM CHLORIDE 0.9 % IV SOLN
200.0000 mg | INTRAVENOUS | Status: AC
  Administered 2018-04-24 – 2018-05-01 (×8): 200 mg via INTRAVENOUS
  Filled 2018-04-23 (×8): qty 200

## 2018-04-23 NOTE — Consult Note (Signed)
Pharmacy Antibiotic Note  Andrew Gibbs is a 62 y.o. male admitted on 04/17/2018 with sepsis.  Pharmacy has been consulted for  vancomycin dosing.  Plan: 10/08 @ 0500 VT 16 mcg/mL drawn 1 hour too early from prior dose given, true trough around 15 mcg/mL. Will continue current regimen and will recheck trough tomorrow w/ am labs. Renal function appears stable.  Height: 5\' 7"  (170.2 cm) Weight: 146 lb 2.6 oz (66.3 kg) IBW/kg (Calculated) : 66.1  Temp (24hrs), Avg:98.4 F (36.9 C), Min:98 F (36.7 C), Max:98.6 F (37 C)  Recent Labs  Lab 04/17/18 1233 04/17/18 1634 04/18/18 0543  04/21/18 0347 04/21/18 1120 04/23/18 0446  WBC 3.7*  --  6.2  --  4.5  --   --   CREATININE 0.68  --  0.50*  --  0.56*  --   --   LATICACIDVEN 4.1* 2.7*  --   --   --   --   --   VANCOTROUGH  --   --   --    < >  --  8* 16   < > = values in this interval not displayed.    Estimated Creatinine Clearance: 89.5 mL/min (A) (by C-G formula based on SCr of 0.56 mg/dL (L)).    No Known Allergies  Antimicrobials this admission: Cefepime 10/02 >>  Vancomycin 10/02 >>  Metronidazole 10/02 >>  Dose adjustments this admission:   Microbiology results: 10/02 BCx: 3 of 4 bottles with yeast, 2 of 4 bottles with GNR identified as stenotrophomonas maltophilia 10/04 BCx: NGTD  UCx:    Sputum:    MRSA PCR:  10/02 WoundCx: few staph aureus, few serratia marcescens  Thank you for allowing pharmacy to be a part of this patient's care.  Tobie Lords, Pharm.D., BCPS Clinical Pharmacist 04/23/2018 6:03 AM

## 2018-04-23 NOTE — Progress Notes (Signed)
   04/23/18 1100  Clinical Encounter Type  Visited With Patient;Family  Visit Type Initial (AD Education)  Referral From Chaplain Rhina Brackett)  Recommendations Notarize documents.  Spiritual Encounters  Spiritual Needs Emotional  Stress Factors  Patient Stress Factors Health changes  Family Stress Factors Health changes   Chaplain was asked to assist the patient's sister to work through the [employer] process. Chaplain met with the patient and assessed that he was able to make informed decisions. Chaplain assisted the family with education and emotional support as they processed the implications of the living will. During the education process, the patient's sister, [name], wanted to speak with the patient's doctor. Chaplain facilitated communication with the patient's nurse. After the doctor's visit, education continued.  AD documentation was completed, pending notarization.

## 2018-04-23 NOTE — Consult Note (Signed)
Reason for Consult: rule out ocular infection Referring Physician: Dr. Earleen Newport Chief complaint: positive blood cultures, (patient without ocular complaint)  HPI: Andrew Gibbs is an 62 y.o. male with no significant past ocular history, has a complex medical history significant IV heroin drug abuse and hepatitis, now hospitalized for sepsis with blood cultures positive for yeast and bacteria, recently diagnosed with hepatocellular carcinoma.  Patient does not have any ocular or visual complaints, he reports good vision.  He does not use glasses, he has never had any eye surgeries or procedures.  He has not had a recent eye exam and denies any significant family history of eye problems including glaucoma. He denies flashes, floaters, eye pain, blurry vision or diplopia.  Blood cultures positive for: FLAVOBACTERIUM INDOLOGENES  CANDIDA LUSITANIAE  STENOTROPHOMONAS MALTOPHILIA .    Past Medical History:  Diagnosis Date  . Hepatitis C   . Heroin use     ROS  Past Surgical History:  Procedure Laterality Date  . KNEE SURGERY    . TEE WITHOUT CARDIOVERSION N/A 04/22/2018   Procedure: TRANSESOPHAGEAL ECHOCARDIOGRAM (TEE);  Surgeon: Teodoro Spray, MD;  Location: [facility] ORS;  Service: Cardiovascular;  Laterality: N/A;    History reviewed. No pertinent family history.  Social History:  reports that he has been smoking. He has never used smokeless tobacco. He reports that he drinks alcohol. He reports that he has current or past drug history. Drugs: IV and Cocaine.  Allergies: No Known Allergies  Prior to Admission medications   Medication Sig Start Date End Date Taking? Authorizing Provider  ibuprofen (ADVIL,MOTRIN) 200 MG tablet Take 800-1,000 mg by mouth as needed for moderate pain.   Yes [provider]  potassium chloride SA (KLOR-CON M20) 20 MEQ tablet Take 1 tablet (20 mEq total) by mouth daily. Patient not taking: Reported on 04/17/2018 02/14/18   Hinda Kehr, MD     Results for orders placed or performed during the hospital encounter of 04/17/18 (from the past 48 hour(s))  Vancomycin, trough     Status: None   Collection Time: 04/23/18  4:46 AM  Result Value Ref Range   Vancomycin Tr 16 15 - 20 ug/mL    Comment: Performed at [facility], [address] [place], [place] [address]    Dg Esophagus  Result Date: 04/22/2018 CLINICAL DATA:  Status post transesophageal echocardiogram. Evaluate for esophageal abrasion. EXAM: ESOPHOGRAM/BARIUM SWALLOW TECHNIQUE: Combined double contrast and single contrast examination performed using effervescent water-soluble contrast (Isovue-300) and thin barium liquid. FLUOROSCOPY TIME:  Fluoroscopy Time:  0.7 minute Radiation Exposure Index (if provided by the fluoroscopic device): 4.6 mGy COMPARISON:  None. FINDINGS: There was normal pharyngeal anatomy and motility. Contrast flowed freely through the esophagus without evidence of stricture or mass. There was normal esophageal mucosa without evidence of irregularity or ulceration. Esophageal motility was normal. No evidence of reflux. Small hiatal hernia. No extraluminal contrast to suggest esophageal perforation. No mucosal defect to suggest mucosal injury. IMPRESSION: 1. No radiographic evidence of esophageal injury. No extraluminal contrast to suggest perforation. Electronically Signed   By: Kathreen Devoid   On: 04/22/2018 14:00    Blood pressure (!) 152/62, pulse (!) 58, temperature 98.6 F (37 C), temperature source Oral, resp. rate 20, height 5\' 7"  (1.702 m), weight 65.9 kg, SpO2 97 %.  Mental status: Alert and Oriented x 4  Visual Acuity:  20/50+ OD  20/50+ near [name]  Pupils:  Equally round/ reactive to light.  No Afferent defect.  Motility:  Full/ orthophoric  Visual Fields:  Full to confrontation  IOP:  Soft to palpation  External/ Lids/ Lashes:  Normal  Anterior Segment:  Conjunctiva:  Normal  OU  Cornea:  Normal  OU  Anterior Chamber: Normal   OU  Lens:   1.5 NS   OU  Posterior Segment: Dilated OU with 1% Tropicamide and 2.5% Phenylephrine  Discs:   Normal c/d ratio, no pallor, no edema OU  Macula:  Normal  Vessels/ Periphery: Normal    Assessment/Plan: 1.  Patient with positive blood cultures including candida.  No signs of ocular involvement.  Continue systemic treatment per ID/primary physicians. 2.  Presbyopia.  Over the counter readers would improve near/reading vision. 3.  Cataracts, subsurgical.  Signing off.  Please reconsult if develops visual/ocular symptoms.  [name] 04/23/2018, 12:57 PM

## 2018-04-23 NOTE — Progress Notes (Signed)
Patient ID: Andrew Gibbs, male   DOB: 1955/10/02, 62 y.o.   MRN: [id number]  [facility] PROGRESS NOTE  Andrew Gibbs KTG:[id number] DOB: Oct 20, 1955 DOA: 04/17/2018 PCP: Patient, No Pcp Per  HPI/Subjective: Denies any complaints sister at bedside  Objective: Vitals:   04/23/18 0431 04/23/18 1300  BP: (!) 152/62 (!) 129/53  Pulse: (!) 58 68  Resp: 20   Temp: 98.6 F (37 C) 98.5 F (36.9 C)  SpO2: 97% 97%    Filed Weights   04/22/18 0500 04/22/18 0758 04/23/18 0500  Weight: 66.3 kg 66.3 kg 65.9 kg    ROS: Review of Systems  Constitutional: Negative for chills and fever.  Eyes: Negative for blurred vision.  Respiratory: Negative for cough and shortness of breath.   Cardiovascular: Negative for chest pain.  Gastrointestinal: Negative for abdominal pain, constipation, diarrhea, nausea and vomiting.  Genitourinary: Negative for dysuria.  Musculoskeletal: Negative for back pain, joint pain and myalgias.  Neurological: Negative for dizziness and headaches.   Exam: Physical Exam  HENT:  Nose: No mucosal edema.  Mouth/Throat: No oropharyngeal exudate or posterior oropharyngeal edema.  Eyes: Pupils are equal, round, and reactive to light. Conjunctivae, EOM and lids are normal.  Neck: No JVD present. Carotid bruit is not present. No edema present. No thyroid mass and no thyromegaly present.  Cardiovascular: S1 normal and S2 normal. Exam reveals no gallop.  No murmur heard. Pulses:      Dorsalis pedis pulses are 2+ on the right side, and 2+ on the left side.  Respiratory: No respiratory distress. He has no wheezes. He has no rhonchi. He has no rales.  GI: Soft. Bowel sounds are normal. There is no tenderness.  Musculoskeletal:       Right elbow: He exhibits swelling.       Left elbow: He exhibits swelling.       Right ankle: He exhibits no swelling.       Left ankle: He exhibits no swelling.  Lymphadenopathy:    He has no cervical adenopathy.  Neurological: He  is alert. No cranial nerve deficit.  Skin: Skin is warm. Nails show no clubbing.  Numerous areas of small little furuncles on bilateral arms.  On the right arm he does have an open tract.  I do not see any specific abscess.  Psychiatric: He has a normal mood and affect.      Data Reviewed: Basic Metabolic Panel: Recent Labs  Lab 04/17/18 1233 04/18/18 0543 04/21/18 0347  NA 139 139 136  K 3.6 3.3* 3.7  CL 109 113* 115*  CO2 21* 20* 20*  GLUCOSE 105* 91 104*  BUN 9 8 11   CREATININE 0.68 0.50* 0.56*  CALCIUM 7.8* 7.4* 7.6*   Liver Function Tests: Recent Labs  Lab 04/17/18 1233  AST 36  ALT 9  ALKPHOS 142*  BILITOT 1.5*  PROT 7.3  ALBUMIN 1.8*   Recent Labs  Lab 04/17/18 1233  LIPASE 40   CBC: Recent Labs  Lab 04/17/18 1233 04/17/18 2350 04/18/18 0543 04/21/18 0347  WBC 3.7*  --  6.2 4.5  NEUTROABS 3.2  --   --   --   HGB 6.3* 8.7* 8.9* 10.3*  HCT 18.6*  --  25.0* 30.5*  MCV 105.9*  --  100.7* 99.4  PLT 56*  --  40* 48*   Cardiac Enzymes: Recent Labs  Lab 04/17/18 1233  TROPONINI <0.03     Recent Results (from the past 240 hour(s))  Urine culture  Status: None   Collection Time: 04/17/18 12:20 PM  Result Value Ref Range Status   Specimen Description   Final    URINE, RANDOM Performed at [facility], [address], [place], [place] [address]    Special Requests   Final    NONE Performed at [facility], [address], [place], [place] [address]    Culture   Final    NO GROWTH Performed at [facility], [place] [address], [place], [place] [address]    Report Status 04/19/2018 FINAL  Final  Blood Culture (routine x 2)     Status: Abnormal   Collection Time: 04/17/18 12:33 PM  Result Value Ref Range Status   Specimen Description   Final    BLOOD RIGHT HAND Performed at [facility], [address], [place], [place] [address]    Special Requests   Final    BOTTLES DRAWN AEROBIC AND ANAEROBIC Blood  Culture adequate volume Performed at [facility], [address], [place], [place] [address]    Culture  Setup Time   Final    GRAM NEGATIVE RODS YEAST IN BOTH AEROBIC AND ANAEROBIC BOTTLES CRITICAL VALUE NOTED.  VALUE IS CONSISTENT WITH PREVIOUSLY REPORTED AND CALLED VALUE. Performed at [facility], [address] [place], [place] [address]    Culture (A)  Final    FLAVOBACTERIUM INDOLOGENES CANDIDA LUSITANIAE STENOTROPHOMONAS MALTOPHILIA SUSCEPTIBILITIES PERFORMED ON PREVIOUS CULTURE WITHIN THE LAST 5 DAYS. Performed at [facility], [place] [address] [place], [place] [address]    Report Status 04/22/2018 FINAL  Final  Wound or Superficial Culture     Status: None   Collection Time: 04/17/18 12:33 PM  Result Value Ref Range Status   Specimen Description   Final    ARM RIGHT UPPER ARM Performed at [facility], [address], [place], [place] [address]    Special Requests   Final    NONE Performed at [facility], [place], [place] [address]    Gram Stain   Final    NO WBC SEEN FEW GRAM POSITIVE COCCI FEW GRAM NEGATIVE RODS RARE GRAM POSITIVE RODS Performed at [facility], [place] [address], [place],  [address]    Culture   Final    FEW STAPHYLOCOCCUS AUREUS FEW SERRATIA MARCESCENS    Report Status 04/21/2018 FINAL  Final   Organism ID, Bacteria SERRATIA MARCESCENS  Final   Organism ID, Bacteria STAPHYLOCOCCUS AUREUS  Final      Susceptibility   Staphylococcus aureus - MIC*    CIPROFLOXACIN <=0.5 SENSITIVE Sensitive     ERYTHROMYCIN <=0.25 SENSITIVE Sensitive     GENTAMICIN <=0.5 SENSITIVE Sensitive     OXACILLIN <=0.25 SENSITIVE Sensitive     TETRACYCLINE <=1 SENSITIVE Sensitive     VANCOMYCIN 1 SENSITIVE Sensitive     TRIMETH/SULFA <=10 SENSITIVE Sensitive     CLINDAMYCIN <=0.25 SENSITIVE Sensitive     RIFAMPIN <=0.5 SENSITIVE Sensitive     Inducible Clindamycin NEGATIVE Sensitive     *  FEW STAPHYLOCOCCUS AUREUS   Serratia marcescens - MIC*    CEFAZOLIN >=64 RESISTANT Resistant     CEFEPIME <=1 SENSITIVE Sensitive     CEFTAZIDIME <=1 SENSITIVE Sensitive     CEFTRIAXONE <=1 SENSITIVE Sensitive     CIPROFLOXACIN <=0.25 SENSITIVE Sensitive     GENTAMICIN <=1 SENSITIVE Sensitive     TRIMETH/SULFA <=20 SENSITIVE Sensitive     * FEW SERRATIA  MARCESCENS  Blood Culture (routine x 2)     Status: Abnormal   Collection Time: 04/17/18  1:05 PM  Result Value Ref Range Status   Specimen Description   Final    BLOOD LEFT ARM Performed at [facility], [address] [place], [place] [address]    Special Requests   Final    BOTTLES DRAWN AEROBIC AND ANAEROBIC Blood Culture adequate volume Performed at [facility], [address] [place], [place] [address]    Culture  Setup Time   Final    YEAST AEROBIC BOTTLE ONLY CRITICAL RESULT CALLED TO, READ BACK BY AND VERIFIED WITH: JASON ROBBINS AT 9470 ON 04/18/18 [place]. GRAM NEGATIVE RODS ANAEROBIC BOTTLE ONLY CRITICAL RESULT CALLED TO, READ BACK BY AND VERIFIED WITH: [place] @1917  04/18/18 AKT Performed at [facility], [place] [address], [place], [place] [address]    Culture CANDIDA LUSITANIAE STENOTROPHOMONAS MALTOPHILIA  (A)  Final   Report Status 04/21/2018 FINAL  Final   Organism ID, Bacteria STENOTROPHOMONAS MALTOPHILIA  Final      Susceptibility   Stenotrophomonas maltophilia - MIC*    LEVOFLOXACIN 0.5 SENSITIVE Sensitive     TRIMETH/SULFA <=20 SENSITIVE Sensitive     * STENOTROPHOMONAS MALTOPHILIA  Blood Culture ID Panel (Reflexed)     Status: None   Collection Time: 04/17/18  1:05 PM  Result Value Ref Range Status   Enterococcus species NOT DETECTED NOT DETECTED Final   Listeria monocytogenes NOT DETECTED NOT DETECTED Final   Staphylococcus species NOT DETECTED NOT DETECTED Final   Staphylococcus aureus (BCID) NOT DETECTED NOT DETECTED Final   Streptococcus species NOT DETECTED NOT  DETECTED Final   Streptococcus agalactiae NOT DETECTED NOT DETECTED Final   Streptococcus pneumoniae NOT DETECTED NOT DETECTED Final   Streptococcus pyogenes NOT DETECTED NOT DETECTED Final   Acinetobacter baumannii NOT DETECTED NOT DETECTED Final   Enterobacteriaceae species NOT DETECTED NOT DETECTED Final   Enterobacter cloacae complex NOT DETECTED NOT DETECTED Final   Escherichia coli NOT DETECTED NOT DETECTED Final   Klebsiella oxytoca NOT DETECTED NOT DETECTED Final   Klebsiella pneumoniae NOT DETECTED NOT DETECTED Final   Proteus species NOT DETECTED NOT DETECTED Final   Serratia marcescens NOT DETECTED NOT DETECTED Final   Haemophilus influenzae NOT DETECTED NOT DETECTED Final   Neisseria meningitidis NOT DETECTED NOT DETECTED Final   Pseudomonas aeruginosa NOT DETECTED NOT DETECTED Final   Candida albicans NOT DETECTED NOT DETECTED Final   Candida glabrata NOT DETECTED NOT DETECTED Final   Candida krusei NOT DETECTED NOT DETECTED Final   Candida parapsilosis NOT DETECTED NOT DETECTED Final   Candida tropicalis NOT DETECTED NOT DETECTED Final    Comment: Performed at [facility], [address] [place], [place] [address]  Blood Culture ID Panel (Reflexed)     Status: None   Collection Time: 04/17/18  3:04 PM  Result Value Ref Range Status   Enterococcus species NOT DETECTED NOT DETECTED Final   Listeria monocytogenes NOT DETECTED NOT DETECTED Final   Staphylococcus species NOT DETECTED NOT DETECTED Final   Staphylococcus aureus (BCID) NOT DETECTED NOT DETECTED Final   Streptococcus species NOT DETECTED NOT DETECTED Final   Streptococcus agalactiae NOT DETECTED NOT DETECTED Final   Streptococcus pneumoniae NOT DETECTED NOT DETECTED Final   Streptococcus pyogenes NOT DETECTED NOT DETECTED Final   Acinetobacter baumannii NOT DETECTED NOT DETECTED Final   Enterobacteriaceae species NOT DETECTED NOT DETECTED Final   Enterobacter cloacae complex NOT DETECTED  NOT  DETECTED Final   Escherichia coli NOT DETECTED NOT DETECTED Final   Klebsiella oxytoca NOT DETECTED NOT DETECTED Final   Klebsiella pneumoniae NOT DETECTED NOT DETECTED Final   Proteus species NOT DETECTED NOT DETECTED Final   Serratia marcescens NOT DETECTED NOT DETECTED Final   Haemophilus influenzae NOT DETECTED NOT DETECTED Final   Neisseria meningitidis NOT DETECTED NOT DETECTED Final   Pseudomonas aeruginosa NOT DETECTED NOT DETECTED Final   Candida albicans NOT DETECTED NOT DETECTED Final   Candida glabrata NOT DETECTED NOT DETECTED Final   Candida krusei NOT DETECTED NOT DETECTED Final   Candida parapsilosis NOT DETECTED NOT DETECTED Final   Candida tropicalis NOT DETECTED NOT DETECTED Final    Comment: Performed at [facility], [address] [place], [place] [address]  CULTURE, BLOOD (ROUTINE X 2) w Reflex to ID Panel     Status: None (Preliminary result)   Collection Time: 04/19/18 12:44 AM  Result Value Ref Range Status   Specimen Description BLOOD BLOOD LEFT HAND  Final   Special Requests   Final    BOTTLES DRAWN AEROBIC AND ANAEROBIC Blood Culture adequate volume   Culture   Final    NO GROWTH 4 DAYS Performed at [facility], [address], [place], [place] [address]    Report Status PENDING  Incomplete  CULTURE, BLOOD (ROUTINE X 2) w Reflex to ID Panel     Status: None (Preliminary result)   Collection Time: 04/19/18 12:46 AM  Result Value Ref Range Status   Specimen Description BLOOD BLOOD RIGHT HAND  Final   Special Requests   Final    BOTTLES DRAWN AEROBIC AND ANAEROBIC Blood Culture adequate volume   Culture   Final    NO GROWTH 4 DAYS Performed at [facility], [address], [place], [place] [address]    Report Status PENDING  Incomplete     Studies: Dg Esophagus  Result Date: 04/22/2018 CLINICAL DATA:  Status post transesophageal echocardiogram. Evaluate for esophageal abrasion. EXAM: ESOPHOGRAM/BARIUM SWALLOW  TECHNIQUE: Combined double contrast and single contrast examination performed using effervescent water-soluble contrast (Isovue-300) and thin barium liquid. FLUOROSCOPY TIME:  Fluoroscopy Time:  0.7 minute Radiation Exposure Index (if provided by the fluoroscopic device): 4.6 mGy COMPARISON:  None. FINDINGS: There was normal pharyngeal anatomy and motility. Contrast flowed freely through the esophagus without evidence of stricture or mass. There was normal esophageal mucosa without evidence of irregularity or ulceration. Esophageal motility was normal. No evidence of reflux. Small hiatal hernia. No extraluminal contrast to suggest esophageal perforation. No mucosal defect to suggest mucosal injury. IMPRESSION: 1. No radiographic evidence of esophageal injury. No extraluminal contrast to suggest perforation. Electronically Signed   By: Kathreen Devoid   On: 04/22/2018 14:00    Scheduled Meds: . docusate sodium  100 mg Oral BID  . feeding supplement (ENSURE ENLIVE)  237 mL Oral BID BM  . multivitamin with minerals  1 tablet Oral Daily   Continuous Infusions: . 0.9 % NaCl with KCl 20 mEq / L 100 mL/hr at 04/23/18 1146  . anidulafungin Stopped (04/21/18 1753)  . levofloxacin (LEVAQUIN) IV 750 mg (04/23/18 1148)  . vancomycin 1,000 mg (04/23/18 1407)    Assessment/Plan:  1. Clinical sepsis.  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 Appreciate infectious disease consultation and continue IV Anidulafungin, vancomycin and levaquin for right now.  TEE shows small endocarditis-we will need total of 6 weeks of IV antibiotic 2. Liver mass consistent  with hepatocellular cancer oncology has seen the patient outpatient follow-up once current clinical situation improved 3. 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   4. Back pain.  MRI of the thoracic spine secondary to back pain.  Shows discitis treatment as per #1 5. Anemia of chronic disease.   Patient given a transfusion 1 unit of packed red blood cells with good response.  Repeat CBC in the morning  6. patient must stop IV drug use prognosis is now very poor in light of hepatocellular cancer  Code Status:     Code Status Orders  (From admission, onward)         Start     Ordered   04/17/18 1446  Full code  Continuous     04/17/18 1446        Code Status History    This patient has a current code status but no historical code status.      Disposition Plan: Patient will likely stay in the hospital for the entire course of IV antibiotics because I do not want to send a patient with history of IVDA a PICC line and access to his veins at home.  Antibiotics:  Anidalfungin  Vancomycin  Levaquin  Time spent: 28 minutes, [place]

## 2018-04-23 NOTE — Progress Notes (Signed)
Pt. Sister at the bedside and wants to speak with Dr. Posey Pronto. Andrew Gibbs indicates that he is okay have family members be involve in his care now as he is a xxx patient.

## 2018-04-23 NOTE — Progress Notes (Signed)
? Andrew Gibbs is a 62 y.o. with a history  of hepatitis C and daily IV heroin use  admitted with fever and chills and also pain and swelling of the right upper arm.  Patient has Candida  fungemia, stenotrophomonas in the blood culture, and Serratia in wound culture  Subjective States he is feeling better No fever or chills, pain in the back better Pain in his arms better  Objective:  VITALS:  BP (!) 129/53 (BP Location: Left Leg)   Pulse 68   Temp 98.5 F (36.9 C) (Oral)   Resp 20   Ht 5\' 7"  (1.702 m)   Wt 65.9 kg   SpO2 97%   BMI 22.75 kg/m  PHYSICAL EXAM:  General: Awake and alert and in no distress  eyes: Conjunctivae clear, anicteric sclerae. Pupils are constricted, arcus senilis ENT Nares normal. No drainage or sinus tenderness. Lips, mucosa, and tongue normal. No Thrush, poor dentition Neck: Supple, symmetrical, no adenopathy, thyroid: non tender no carotid bruit and no JVD. Back: No CVA tenderness. Lungs: Bilateral air entry, few crackles in the bases Heart: Regular rate and rhythm, no murmur, rub or gallop. Abdomen: Soft, non-tender, mild distention. Bowel sounds normal. No masses Extremities: Multiple pop marks on the upper arms.  Induration and hardening of both arms chronic edema Skin: As above lymph: Cervical, supraclavicular normal. Neurologic: Grossly non-focal Pertinent Labs CBC Latest Ref Rng & Units 04/21/2018 04/18/2018 04/17/2018  WBC 3.8 - 10.6 K/uL 4.5 6.2 -  Hemoglobin 13.0 - 18.0 g/dL 10.3(L) 8.9(L) 8.7(L)  Hematocrit 40.0 - 52.0 % 30.5(L) 25.0(L) -  Platelets 150 - 440 K/uL 48(L) 40(L) -   CMP Latest Ref Rng & Units 04/21/2018 04/18/2018 04/17/2018  Glucose 70 - 99 mg/dL 104(H) 91 105(H)  BUN 8 - 23 mg/dL 11 8 9   Creatinine 0.61 - 1.24 mg/dL 0.56(L) 0.50(L) 0.68  Sodium 135 - 145 mmol/L 136 139 139  Potassium 3.5 - 5.1 mmol/L 3.7 3.3(L) 3.6  Chloride 98 - 111 mmol/L 115(H) 113(H) 109  CO2 22 - 32 mmol/L 20(L) 20(L) 21(L)  Calcium 8.9 - 10.3  mg/dL 7.6(L) 7.4(L) 7.8(L)  Total Protein 6.5 - 8.1 g/dL - - 7.3  Total Bilirubin 0.3 - 1.2 mg/dL - - 1.5(H)  Alkaline Phos 38 - 126 U/L - - 142(H)  AST 15 - 41 U/L - - 36  ALT 0 - 44 U/L - - 9    IMAGING RESULTS: ?X-ray of the chest normal   Impression/Recommendation ? 62 y.o. with a history of history of hepatitis C and daily IV heroin use is admitted with fever and chills and also pain and swelling of the right upper arm. Pt says he last did heroin on the day he came to the ED. He was visiting a friend in group home and felt chills and shakes and his friend called 911 and he came to the ED. He skin pops heroin as he is unable to get a vein in the past many months.  He states he also has mid back pain and if the pain gets worse he always takes some heroin to ease the pain.  He had come to the emergency department in July with left-sided back and chest pain and had MRI of the thoracic spine with and without contrast which did not show any discitis.  He also had cervical spine MRI which which also did not reveal any infection.  In the ED he had a temperature of 100.2 blood pressure of  102/50 heart rate of 16.  Blood cultures were sent and he was started on cefepime and vancomycin.  I am asked to see the patient has his blood culture is positive for yeast.  ? [name] and polymicrobial bacteremia secondary to IV drug use and skin popping.[name]  Has Candida, stenotrophomonas, Flavobacterium in blood culture.  Currently on anidulafungin, levofloxacin and vancomycin  Candida lusitaniae fungemia- will ask lab to do susceptibility for fluconazole. 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 After 2 weeks of IV anidulafungin can switch to fluconazole ( if susceptible) high dose for an indefinite period. Can be then given as PO   stenotrophomonas bacteremia -possible this could be causing endocarditis  and discitis as well. On levaquin  currently- will need for 6 weeks. 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  In July the MRI of the spine was normal.  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  It is  macrocytic.  With underlying hepatitis C need to rule out cirrhosis with the GI bleed or malignancy.  Also need to check B12 and folate  Hepatitis C has not been treated. Has liver cancer  HIV was negative in July 2019  Discussed the management with patient and his sister

## 2018-04-24 DIAGNOSIS — B9689 Other specified bacterial agents as the cause of diseases classified elsewhere: Secondary | ICD-10-CM

## 2018-04-24 DIAGNOSIS — B3789 Other sites of candidiasis: Secondary | ICD-10-CM

## 2018-04-24 DIAGNOSIS — B9561 Methicillin susceptible Staphylococcus aureus infection as the cause of diseases classified elsewhere: Secondary | ICD-10-CM

## 2018-04-24 LAB — CULTURE, BLOOD (ROUTINE X 2)
Culture: NO GROWTH
Culture: NO GROWTH
SPECIAL REQUESTS: ADEQUATE
Special Requests: ADEQUATE
Special Requests: ADEQUATE

## 2018-04-24 LAB — BASIC METABOLIC PANEL
Anion gap: 4 — ABNORMAL LOW (ref 5–15)
CALCIUM: 7.8 mg/dL — AB (ref 8.9–10.3)
CHLORIDE: 108 mmol/L (ref 98–111)
CO2: 23 mmol/L (ref 22–32)
CREATININE: 0.5 mg/dL — AB (ref 0.61–1.24)
GFR calc Af Amer: >60 mL/min (ref >60)
Glucose, Bld: 92 mg/dL (ref 70–99)
Potassium: 3.9 mmol/L (ref 3.5–5.1)
SODIUM: 135 mmol/L (ref 135–145)

## 2018-04-24 LAB — VANCOMYCIN, TROUGH: VANCOMYCIN TR: 21 ug/mL — AB (ref 15–20)

## 2018-04-24 LAB — CBC
HCT: 32.2 % — ABNORMAL LOW (ref 39.0–52.0)
HEMOGLOBIN: 10.8 g/dL — AB (ref 13.0–17.0)
MCH: 33.2 pg (ref 26.0–34.0)
MCHC: 33.5 g/dL (ref 30.0–36.0)
MCV: 99.1 fL (ref 80.0–100.0)
PLATELETS: 52 10*3/uL — AB (ref 150–400)
RBC: 3.25 MIL/uL — ABNORMAL LOW (ref 4.22–5.81)
RDW: 17.2 % — AB (ref 11.5–15.5)
WBC: 4.1 10*3/uL (ref 4.0–10.5)
nRBC: 0 % (ref 0.0–0.2)

## 2018-04-24 MED ORDER — VANCOMYCIN HCL IN DEXTROSE 1-5 GM/200ML-% IV SOLN
1000.0000 mg | Freq: Two times a day (BID) | INTRAVENOUS | Status: AC
  Administered 2018-04-25 – 2018-04-29 (×10): 1000 mg via INTRAVENOUS
  Filled 2018-04-24 (×11): qty 200

## 2018-04-24 NOTE — Progress Notes (Signed)
Pharmacy Antibiotic Note  Andrew Gibbs is a 61 y.o. male admitted on 04/17/2018 with sepsis.  Pharmacy has been consulted for  vancomycin dosing.  Plan: 10/09 @ 0500 VT 21 mcg/mL drawn two hours too early. True trough around 18 mcg/mL. Will draw stat BMP to assess Scr (last result 10/06), UOP 2.2L over past 24 hours. Will continue current regimen and will recheck trough 10/11 @ 0500.  10/09:   Pt growing out MSSA but Dr Delaine Lame wants to continue Vancomycin but aim for lower goal trough of 10 - 15 mcg/mL .   Will adjust Vanc dose to Vancomycin 1 gm IV Q12H to resume on 10/10 @ 0300.   Will draw trough on 10/11 @ 14:30.  CrCl = 88 ml/min Ke = 0.077 hr-1 T1/2 = 9 hrs Vd = 45.5 L     Height: 5\' 7"  (170.2 cm) Weight: 143 lb 4.8 oz (65 kg) IBW/kg (Calculated) : 66.1  Temp (24hrs), Avg:98.4 F (36.9 C), Min:98.2 F (36.8 C), Max:98.6 F (37 C)  Recent Labs  Lab 04/18/18 0543  04/21/18 0347  04/23/18 0446 04/24/18 0515  WBC 6.2  --  4.5  --   --  4.1  CREATININE 0.50*  --  0.56*  --   --  0.50*  VANCOTROUGH  --    < >  --    < > 16 21*   < > = values in this interval not displayed.    Estimated Creatinine Clearance: 88 mL/min (A) (by C-G formula based on SCr of 0.5 mg/dL (L)).    No Known Allergies  Antimicrobials this admission:   >>   >>   Dose adjustments this admission:  Microbiology results:  BCx:  UCx:    Sputum:   MRSA PCR:   Thank you for allowing pharmacy to be a part of this patient's care.  Sehaj Mcenroe D 04/24/2018 5:14 PM

## 2018-04-24 NOTE — Progress Notes (Signed)
Patient ID: Andrew Gibbs, male   DOB: [birth year], 62 y.o.   MRN: [id number]  [employer] [facility] PROGRESS NOTE  Andrew Gibbs IOX:[id number] DOB: 1955/10/07 DOA: 04/17/2018 PCP: Patient, No Pcp Per  HPI/Subjective: Patient denies any complaints   Objective: Vitals:   04/24/18 0750 04/24/18 1157  BP: (!) 152/64 (!) 152/74  Pulse: 64 (!) 57  Resp: 20 18  Temp: 98.5 F (36.9 C) 98.4 F (36.9 C)  SpO2: 93% 96%    Filed Weights   04/22/18 0758 04/23/18 0500 04/23/18 2043  Weight: 66.3 kg 65.9 kg 65 kg    ROS: Review of Systems  Constitutional: Negative for chills and fever.  Eyes: Negative for blurred vision.  Respiratory: Negative for cough and shortness of breath.   Cardiovascular: Negative for chest pain.  Gastrointestinal: Negative for abdominal pain, constipation, diarrhea, nausea and vomiting.  Genitourinary: Negative for dysuria.  Musculoskeletal: Negative for back pain, joint pain and myalgias.  Neurological: Negative for dizziness and headaches.   Exam: Physical Exam  HENT:  Nose: No mucosal edema.  Mouth/Throat: No oropharyngeal exudate or posterior oropharyngeal edema.  Eyes: Pupils are equal, round, and reactive to light. Conjunctivae, EOM and lids are normal.  Neck: No JVD present. Carotid bruit is not present. No edema present. No thyroid mass and no thyromegaly present.  Cardiovascular: S1 normal and S2 normal. Exam reveals no gallop.  No murmur heard. Pulses:      Dorsalis pedis pulses are 2+ on the right side, and 2+ on the left side.  Respiratory: No respiratory distress. He has no wheezes. He has no rhonchi. He has no rales.  GI: Soft. Bowel sounds are normal. There is no tenderness.  Musculoskeletal:       Right elbow: He exhibits swelling.       Left elbow: He exhibits swelling.       Right ankle: He exhibits no swelling.       Left ankle: He exhibits no swelling.  Lymphadenopathy:    He has no cervical adenopathy.  Neurological: He is  alert. No cranial nerve deficit.  Skin: Skin is warm. Nails show no clubbing.  Numerous areas of small little furuncles on bilateral arms.  On the right arm he does have an open tract.  I do not see any specific abscess.  Psychiatric: He has a normal mood and affect.      Data Reviewed: Basic Metabolic Panel: Recent Labs  Lab 04/18/18 0543 04/21/18 0347 04/24/18 0515  NA 139 136 135  K 3.3* 3.7 3.9  CL 113* 115* 108  CO2 20* 20* 23  GLUCOSE 91 104* 92  BUN 8 11 <5*  CREATININE 0.50* 0.56* 0.50*  CALCIUM 7.4* 7.6* 7.8*   Liver Function Tests: No results for input(s): AST, ALT, ALKPHOS, BILITOT, PROT, ALBUMIN in the last 168 hours. No results for input(s): LIPASE, AMYLASE in the last 168 hours. CBC: Recent Labs  Lab 04/17/18 2350 04/18/18 0543 04/21/18 0347 04/24/18 0515  WBC  --  6.2 4.5 4.1  HGB 8.7* 8.9* 10.3* 10.8*  HCT  --  25.0* 30.5* 32.2*  MCV  --  100.7* 99.4 99.1  PLT  --  40* 48* 52*   Cardiac Enzymes: No results for input(s): CKTOTAL, CKMB, CKMBINDEX, TROPONINI in the last 168 hours.   Recent Results (from the past 240 hour(s))  Urine culture     Status: None   Collection Time: 04/17/18 12:20 PM  Result Value Ref Range Status   Specimen Description  Final    URINE, RANDOM Performed at [facility], [address] [place], [place] [address]    Special Requests   Final    NONE Performed at [facility], [address] [place], [place] [address]    Culture   Final    NO GROWTH Performed at [facility], [place] [address], [place], [place] [address]    Report Status 04/19/2018 FINAL  Final  Blood Culture (routine x 2)     Status: Abnormal (Preliminary result)   Collection Time: 04/17/18 12:33 PM  Result Value Ref Range Status   Specimen Description   Final    BLOOD RIGHT HAND Performed at [facility], [address], [place], [place] [address]    Special Requests   Final    BOTTLES DRAWN AEROBIC AND  ANAEROBIC Blood Culture adequate volume Performed at [facility], [address], [place], [place] [address]    Culture  Setup Time   Final    GRAM NEGATIVE RODS YEAST IN BOTH AEROBIC AND ANAEROBIC BOTTLES CRITICAL VALUE NOTED.  VALUE IS CONSISTENT WITH PREVIOUSLY REPORTED AND CALLED VALUE. Performed at [facility], [address] [place], [place] [address]    Culture (A)  Final    FLAVOBACTERIUM INDOLOGENES CANDIDA LUSITANIAE STENOTROPHOMONAS MALTOPHILIA SUSCEPTIBILITIES PERFORMED ON PREVIOUS CULTURE WITHIN THE LAST 5 DAYS. Performed at [facility], [place] [address] [place], [place] [address]    Report Status PENDING  Incomplete   Organism ID, Bacteria FLAVOBACTERIUM INDOLOGENES  Final      Susceptibility   Flavobacterium indologenes - MIC*    CEFAZOLIN >=64 RESISTANT Resistant     CEFEPIME >=64 RESISTANT Resistant     CEFTAZIDIME >=64 RESISTANT Resistant     CEFTRIAXONE >=64 RESISTANT Resistant     CIPROFLOXACIN 1 SENSITIVE Sensitive     GENTAMICIN >=16 RESISTANT Resistant     IMIPENEM >=16 RESISTANT Resistant     TRIMETH/SULFA <=20 SENSITIVE Sensitive     PIP/TAZO >=128 RESISTANT Resistant     * FLAVOBACTERIUM INDOLOGENES  Wound or Superficial Culture     Status: None   Collection Time: 04/17/18 12:33 PM  Result Value Ref Range Status   Specimen Description   Final    ARM RIGHT UPPER ARM Performed at [facility], [address] [place], [place] [address]    Special Requests   Final    NONE Performed at [facility], [place], [place] [address]    Gram Stain   Final    NO WBC SEEN FEW GRAM POSITIVE COCCI FEW GRAM NEGATIVE RODS RARE GRAM POSITIVE RODS Performed at [facility], [place] [address], [place], [place] [address]    Culture   Final    FEW STAPHYLOCOCCUS AUREUS FEW SERRATIA MARCESCENS    Report Status 04/21/2018 FINAL  Final   Organism ID, Bacteria SERRATIA MARCESCENS  Final    Organism ID, Bacteria STAPHYLOCOCCUS AUREUS  Final      Susceptibility   Staphylococcus aureus - MIC*    CIPROFLOXACIN <=0.5 SENSITIVE Sensitive     ERYTHROMYCIN <=0.25 SENSITIVE Sensitive     GENTAMICIN <=0.5 SENSITIVE Sensitive     OXACILLIN <=0.25 SENSITIVE Sensitive     TETRACYCLINE <=1 SENSITIVE Sensitive     VANCOMYCIN 1 SENSITIVE Sensitive     TRIMETH/SULFA <=10 SENSITIVE Sensitive     CLINDAMYCIN <=0.25 SENSITIVE Sensitive     RIFAMPIN <=0.5 SENSITIVE Sensitive     Inducible  Clindamycin NEGATIVE Sensitive     * FEW STAPHYLOCOCCUS AUREUS   Serratia marcescens - MIC*    CEFAZOLIN >=64 RESISTANT Resistant     CEFEPIME <=1 SENSITIVE Sensitive     CEFTAZIDIME <=1 SENSITIVE Sensitive     CEFTRIAXONE <=1 SENSITIVE Sensitive     CIPROFLOXACIN <=0.25 SENSITIVE Sensitive     GENTAMICIN <=1 SENSITIVE Sensitive     TRIMETH/SULFA <=20 SENSITIVE Sensitive     * FEW SERRATIA MARCESCENS  Blood Culture (routine x 2)     Status: Abnormal   Collection Time: 04/17/18  1:05 PM  Result Value Ref Range Status   Specimen Description   Final    BLOOD LEFT ARM Performed at [facility], [address] [place], [place] [address]    Special Requests   Final    BOTTLES DRAWN AEROBIC AND ANAEROBIC Blood Culture adequate volume Performed at [facility], [place] [place], [place] [address]    Culture  Setup Time   Final    YEAST AEROBIC BOTTLE ONLY CRITICAL RESULT CALLED TO, READ BACK BY AND VERIFIED WITH: JASON ROBBINS AT 8502 ON 04/18/18 [place]. GRAM NEGATIVE RODS ANAEROBIC BOTTLE ONLY CRITICAL RESULT CALLED TO, READ BACK BY AND VERIFIED WITH: [place] @1917  04/18/18 AKT Performed at [facility], [place] [address], [place], [place] [address]    Culture CANDIDA LUSITANIAE STENOTROPHOMONAS MALTOPHILIA  (A)  Final   Report Status 04/21/2018 FINAL  Final   Organism ID, Bacteria STENOTROPHOMONAS MALTOPHILIA  Final      Susceptibility   Stenotrophomonas  maltophilia - MIC*    LEVOFLOXACIN 0.5 SENSITIVE Sensitive     TRIMETH/SULFA <=20 SENSITIVE Sensitive     * STENOTROPHOMONAS MALTOPHILIA  Blood Culture ID Panel (Reflexed)     Status: None   Collection Time: 04/17/18  1:05 PM  Result Value Ref Range Status   Enterococcus species NOT DETECTED NOT DETECTED Final   Listeria monocytogenes NOT DETECTED NOT DETECTED Final   Staphylococcus species NOT DETECTED NOT DETECTED Final   Staphylococcus aureus (BCID) NOT DETECTED NOT DETECTED Final   Streptococcus species NOT DETECTED NOT DETECTED Final   Streptococcus agalactiae NOT DETECTED NOT DETECTED Final   Streptococcus pneumoniae NOT DETECTED NOT DETECTED Final   Streptococcus pyogenes NOT DETECTED NOT DETECTED Final   Acinetobacter baumannii NOT DETECTED NOT DETECTED Final   Enterobacteriaceae species NOT DETECTED NOT DETECTED Final   Enterobacter cloacae complex NOT DETECTED NOT DETECTED Final   Escherichia coli NOT DETECTED NOT DETECTED Final   Klebsiella oxytoca NOT DETECTED NOT DETECTED Final   Klebsiella pneumoniae NOT DETECTED NOT DETECTED Final   Proteus species NOT DETECTED NOT DETECTED Final   Serratia marcescens NOT DETECTED NOT DETECTED Final   Haemophilus influenzae NOT DETECTED NOT DETECTED Final   Neisseria meningitidis NOT DETECTED NOT DETECTED Final   Pseudomonas aeruginosa NOT DETECTED NOT DETECTED Final   Candida albicans NOT DETECTED NOT DETECTED Final   Candida glabrata NOT DETECTED NOT DETECTED Final   Candida krusei NOT DETECTED NOT DETECTED Final   Candida parapsilosis NOT DETECTED NOT DETECTED Final   Candida tropicalis NOT DETECTED NOT DETECTED Final    Comment: Performed at [facility], [place] [place], [place] [address]  Blood Culture ID Panel (Reflexed)     Status: None   Collection Time: 04/17/18  3:04 PM  Result Value Ref Range Status   Enterococcus species NOT DETECTED NOT DETECTED Final   Listeria monocytogenes NOT DETECTED NOT DETECTED  Final  Staphylococcus species NOT DETECTED NOT DETECTED Final   Staphylococcus aureus (BCID) NOT DETECTED NOT DETECTED Final   Streptococcus species NOT DETECTED NOT DETECTED Final   Streptococcus agalactiae NOT DETECTED NOT DETECTED Final   Streptococcus pneumoniae NOT DETECTED NOT DETECTED Final   Streptococcus pyogenes NOT DETECTED NOT DETECTED Final   Acinetobacter baumannii NOT DETECTED NOT DETECTED Final   Enterobacteriaceae species NOT DETECTED NOT DETECTED Final   Enterobacter cloacae complex NOT DETECTED NOT DETECTED Final   Escherichia coli NOT DETECTED NOT DETECTED Final   Klebsiella oxytoca NOT DETECTED NOT DETECTED Final   Klebsiella pneumoniae NOT DETECTED NOT DETECTED Final   Proteus species NOT DETECTED NOT DETECTED Final   Serratia marcescens NOT DETECTED NOT DETECTED Final   Haemophilus influenzae NOT DETECTED NOT DETECTED Final   Neisseria meningitidis NOT DETECTED NOT DETECTED Final   Pseudomonas aeruginosa NOT DETECTED NOT DETECTED Final   Candida albicans NOT DETECTED NOT DETECTED Final   Candida glabrata NOT DETECTED NOT DETECTED Final   Candida krusei NOT DETECTED NOT DETECTED Final   Candida parapsilosis NOT DETECTED NOT DETECTED Final   Candida tropicalis NOT DETECTED NOT DETECTED Final    Comment: Performed at [facility], [address] [place], [place] [address]  CULTURE, BLOOD (ROUTINE X 2) w Reflex to ID Panel     Status: None   Collection Time: 04/19/18 12:44 AM  Result Value Ref Range Status   Specimen Description BLOOD BLOOD LEFT HAND  Final   Special Requests   Final    BOTTLES DRAWN AEROBIC AND ANAEROBIC Blood Culture adequate volume   Culture   Final    NO GROWTH 5 DAYS Performed at [facility], [address] [place], [place] [address]    Report Status 04/24/2018 FINAL  Final  CULTURE, BLOOD (ROUTINE X 2) w Reflex to ID Panel     Status: None   Collection Time: 04/19/18 12:46 AM  Result Value Ref Range Status    Specimen Description BLOOD BLOOD RIGHT HAND  Final   Special Requests   Final    BOTTLES DRAWN AEROBIC AND ANAEROBIC Blood Culture adequate volume   Culture   Final    NO GROWTH 5 DAYS Performed at [facility], [address], [place], [place] [address]    Report Status 04/24/2018 FINAL  Final     Studies: Dg Esophagus  Result Date: 04/22/2018 CLINICAL DATA:  Status post transesophageal echocardiogram. Evaluate for esophageal abrasion. EXAM: ESOPHOGRAM/BARIUM SWALLOW TECHNIQUE: Combined double contrast and single contrast examination performed using effervescent water-soluble contrast (Isovue-300) and thin barium liquid. FLUOROSCOPY TIME:  Fluoroscopy Time:  0.7 minute Radiation Exposure Index (if provided by the fluoroscopic device): 4.6 mGy COMPARISON:  None. FINDINGS: There was normal pharyngeal anatomy and motility. Contrast flowed freely through the esophagus without evidence of stricture or mass. There was normal esophageal mucosa without evidence of irregularity or ulceration. Esophageal motility was normal. No evidence of reflux. Small hiatal hernia. No extraluminal contrast to suggest esophageal perforation. No mucosal defect to suggest mucosal injury. IMPRESSION: 1. No radiographic evidence of esophageal injury. No extraluminal contrast to suggest perforation. Electronically Signed   By: Kathreen Devoid   On: 04/22/2018 14:00    Scheduled Meds: . docusate sodium  100 mg Oral BID  . feeding supplement (ENSURE ENLIVE)  237 mL Oral BID BM  . multivitamin with minerals  1 tablet Oral Daily   Continuous Infusions: . 0.9 % NaCl with KCl 20 mEq / L 100 mL/hr  at 04/24/18 0431  . anidulafungin    . levofloxacin (LEVAQUIN) IV Stopped (04/23/18 1321)  . vancomycin 1,000 mg (04/24/18 5427)    Assessment/Plan:  1. Clinical sepsis.  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 Appreciate infectious disease consultation and continue IV Anidulafungin, vancomycin  and levaquin for right now.  TEE shows small endocarditis-continue antibiotics as per ID Dr.  2. liver mass consistent with hepatocellular cancer oncology has seen the patient outpatient follow-up once current clinical situation improved 3. 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   4. Back pain.  MRI of the thoracic spine secondary to back pain.  Shows discitis treatment as per #1 5. Anemia of chronic disease.  Status post 1 unit of packed RBCs follow cbc 6. patient must stop IV drug use prognosis is now very poor in light of hepatocellular cancer  Code Status:     Code Status Orders  (From admission, onward)         Start     Ordered   04/17/18 1446  Full code  Continuous     04/17/18 1446        Code Status History    This patient has a current code status but no historical code status.      Disposition Plan: Patient will likely stay in the hospital for the entire course of IV antibiotics because I do not want to send a patient with history of IVDA a PICC line and access to his veins at home.  Antibiotics:  Anidalfungin  Vancomycin  Levaquin  Time spent: 28 minutes, [place]

## 2018-04-24 NOTE — Progress Notes (Signed)
   04/24/18 1000  Clinical Encounter Type  Visited With Health care provider;Patient  Visit Type Follow-up (AD Completion)  Referral From Chaplain Saverio Danker)  Recommendations Patient to complete off-campus.  Spiritual Encounters  Spiritual Needs Other (Comment) (Assurance that AD includes his wishes.)   Chaplain attempted to complete the AD on 04/23/2018 (see previous note). Today, a notary was located, but patient now has precautions and witnesses may not enter the room. Therefore, Chaplain has closed AD request and will discuss how to complete the AD after patient release.Chaplain will reach out to the patient's sister, [name].

## 2018-04-24 NOTE — Progress Notes (Signed)
? Andrew Gibbs is a 62 y.o. with a history  of hepatitis C and daily IV heroin use  admitted with fever and chills and also pain and swelling of the right upper arm.  Patient has Candida  fungemia, stenotrophomonas in the blood culture, and Serratia in wound culture  Subjective States he is feeling better No fever or chills, pain in the back better Had couple of loose stools yesterday but none today  Objective:  VITALS:  BP 137/61 (BP Location: Left Leg)   Pulse 71   Temp 98.6 F (37 C) (Oral)   Resp 18   Ht 5\' 7"  (1.702 m)   Wt 65 kg   SpO2 95%   BMI 22.44 kg/m  PHYSICAL EXAM:  General: Awake and alert and in no distress  eyes: Conjunctivae clear, anicteric sclerae. Pupils are constricted, arcus senilis ENT Nares normal. No drainage or sinus tenderness. Lips, mucosa, and tongue normal. No Thrush, poor dentition Neck: Supple, symmetrical, no adenopathy, thyroid: non tender no carotid bruit and no JVD. Back: No CVA tenderness. Lungs: Bilateral air entry, few crackles in the bases Heart: Regular rate and rhythm, no murmur, rub or gallop. Abdomen: Soft, non-tender, mild distention. Bowel sounds normal. No masses Extremities: Multiple pop marks on the upper arms.  Induration and hardening of both arms chronic edema is better Skin: As above lymph: Cervical, supraclavicular normal. Neurologic: Grossly non-focal Pertinent Labs CBC Latest Ref Rng & Units 04/24/2018 04/21/2018 04/18/2018  WBC 4.0 - 10.5 K/uL 4.1 4.5 6.2  Hemoglobin 13.0 - 17.0 g/dL 10.8(L) 10.3(L) 8.9(L)  Hematocrit 39.0 - 52.0 % 32.2(L) 30.5(L) 25.0(L)  Platelets 150 - 400 K/uL 52(L) 48(L) 40(L)   CMP Latest Ref Rng & Units 04/24/2018 04/21/2018 04/18/2018  Glucose 70 - 99 mg/dL 92 104(H) 91  BUN 8 - 23 mg/dL <5(L) 11 8  Creatinine 0.61 - 1.24 mg/dL 0.50(L) 0.56(L) 0.50(L)  Sodium 135 - 145 mmol/L 135 136 139  Potassium 3.5 - 5.1 mmol/L 3.9 3.7 3.3(L)  Chloride 98 - 111 mmol/L 108 115(H) 113(H)  CO2 22 - 32  mmol/L 23 20(L) 20(L)  Calcium 8.9 - 10.3 mg/dL 7.8(L) 7.6(L) 7.4(L)  Total Protein 6.5 - 8.1 g/dL - - -  Total Bilirubin 0.3 - 1.2 mg/dL - - -  Alkaline Phos 38 - 126 U/L - - -  AST 15 - 41 U/L - - -  ALT 0 - 44 U/L - - -    BC- candida Lusitania, stenotrophomonas and flavobacterium IMAGING RESULTS: Personally reviewed cervical spine imaging   Impression/Recommendation ? 62 y.o. with a history of history of hepatitis C and daily IV heroin use is admitted with fever and chills and also pain and swelling of the right upper arm. Pt says he last did heroin on the day he came to the ED. He was visiting a friend in group home and felt chills and shakes and his friend called 911 and he came to the ED. He skin pops heroin as he is unable to get a vein in the past many months.  He states he also has mid back pain and if the pain gets worse he always takes some heroin to ease the pain.  He had come to the emergency department in July with left-sided back and chest pain and had MRI of the thoracic spine with and without contrast which did not show any discitis.  He also had cervical spine MRI which which also did not reveal any infection.  In  the ED he had a temperature of 100.2 blood pressure of 102/50 heart rate of 16.  Blood cultures were sent and he was started on cefepime and vancomycin.  I am asked to see the patient has his blood culture is positive for yeast.  ? [name] and polymicrobial bacteremia secondary to IV drug use and skin popping.[name]  Has Candida, stenotrophomonas, Flavobacterium in blood culture.  Currently on anidulafungin, levofloxacin and vancomycin  Candida lusitaniae fungemia- will ask lab to do susceptibility for fluconazole. 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 After 2 weeks of IV anidulafungin can switch to fluconazole high dose( if susceptibility is back by then)  for an indefinite period. Can be then  given as PO   stenotrophomonas bacteremia -possible this could be causing endocarditis  and discitis as well. On levaquin currently- will need for 6 weeks. 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 MSSA is in the wound only. Aim vanco trough 10-15 until 04/29/18. Do not want to give clindamycin as he had some diarrhea.  If concern for creatinine or worsening trough can stop vancomycin  Back pain.  In July the MRI of the spine was normal.  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  It is  macrocytic.  With underlying hepatitis C need to rule out cirrhosis with the GI bleed or malignancy.  Also need to check B12 and folate  Hepatitis C has not been treated. Has liver cancer with high AFP  HIV was negative in July 2019  Discussed the management with patient and the pharmacist I will be away until  04/30/18.

## 2018-04-24 NOTE — Consult Note (Signed)
Pharmacy Antibiotic Note  BOSTON COOKSON is a 62 y.o. male admitted on 04/17/2018 with sepsis.  Pharmacy has been consulted for  vancomycin dosing.  Plan: 10/09 @ 0500 VT 21 mcg/mL drawn two hours too early. True trough around 18 mcg/mL. Will draw stat BMP to assess Scr (last result 10/06), UOP 2.2L over past 24 hours. Will continue current regimen and will recheck trough 10/11 @ 0500.  Height: 5\' 7"  (170.2 cm) Weight: 145 lb 4.5 oz (65.9 kg) IBW/kg (Calculated) : 66.1  Temp (24hrs), Avg:98.4 F (36.9 C), Min:98.2 F (36.8 C), Max:98.5 F (36.9 C)  Recent Labs  Lab 04/17/18 1233 04/17/18 1634 04/18/18 0543  04/21/18 0347  04/23/18 0446 04/24/18 0515  WBC 3.7*  --  6.2  --  4.5  --   --   --   CREATININE 0.68  --  0.50*  --  0.56*  --   --   --   LATICACIDVEN 4.1* 2.7*  --   --   --   --   --   --   VANCOTROUGH  --   --   --    < >  --    < > 16 21*   < > = values in this interval not displayed.    Estimated Creatinine Clearance: 89.2 mL/min (A) (by C-G formula based on SCr of 0.56 mg/dL (L)).    No Known Allergies  Antimicrobials this admission: Cefepime 10/02 >>  Vancomycin 10/02 >>  Metronidazole 10/02 >>  Dose adjustments this admission:   Microbiology results: 10/02 BCx: 3 of 4 bottles with yeast, 2 of 4 bottles with GNR identified as stenotrophomonas maltophilia 10/04 BCx: NGTD  UCx:    Sputum:    MRSA PCR:  10/02 WoundCx: few staph aureus, few serratia marcescens  Thank you for allowing pharmacy to be a part of this patient's care.  Tobie Lords, Pharm.D., BCPS Clinical Pharmacist 04/24/2018 6:10 AM

## 2018-04-24 NOTE — Plan of Care (Signed)
IV antibiotics provided. No bowel movement today. Pain managed with PRN pain meds.  Problem: Education: Goal: Knowledge of General Education information will improve Description Including pain rating scale, medication(s)/side effects and non-pharmacologic comfort measures Outcome: Progressing   Problem: Health Behavior/Discharge Planning: Goal: Ability to manage health-related needs will improve Outcome: Progressing   Problem: Clinical Measurements: Goal: Ability to maintain clinical measurements within normal limits will improve Outcome: Progressing Goal: Will remain free from infection Outcome: Progressing Goal: Diagnostic test results will improve Outcome: Progressing Goal: Respiratory complications will improve Outcome: Progressing Goal: Cardiovascular complication will be avoided Outcome: Progressing   Problem: Activity: Goal: Risk for activity intolerance will decrease Outcome: Progressing   Problem: Nutrition: Goal: Adequate nutrition will be maintained Outcome: Progressing   Problem: Coping: Goal: Level of anxiety will decrease Outcome: Progressing   Problem: Elimination: Goal: Will not experience complications related to bowel motility Outcome: Progressing Goal: Will not experience complications related to urinary retention Outcome: Progressing   Problem: Pain Managment: Goal: General experience of comfort will improve Outcome: Progressing   Problem: Safety: Goal: Ability to remain free from injury will improve Outcome: Progressing   Problem: Skin Integrity: Goal: Risk for impaired skin integrity will decrease Outcome: Progressing

## 2018-04-24 NOTE — Progress Notes (Signed)
[address]- He has not had any loose stools. ID wants to know if you want to remove from enteric precaution.  Cory Roughen [phone]

## 2018-04-25 MED ORDER — VITAMIN C 500 MG PO TABS
250.0000 mg | ORAL_TABLET | Freq: Two times a day (BID) | ORAL | Status: DC
  Administered 2018-04-25 – 2018-04-26 (×4): 250 mg via ORAL
  Administered 2018-04-27: 500 mg via ORAL
  Administered 2018-04-27 – 2018-05-02 (×10): 250 mg via ORAL
  Filled 2018-04-25 (×15): qty 1

## 2018-04-25 NOTE — Progress Notes (Signed)
[place] at [place] was admitted to the Hospital on 04/17/2018 and  Is still in hospital he is continuing to receive IV antibiotics  In hospital, he will at least be here another 1 week then will decide if he needs to stay further in hospital or can be discharge on oral medications.      Call Dustin Flock MD with questions.  Dustin Flock M.D on 04/25/2018,at 9:23 AM  [place] at [facility]  [phone]

## 2018-04-25 NOTE — Progress Notes (Signed)
Prime doctor notified that pt.'s BP is staying in the 160s/70s. Pt has no PRN antihypertensive medications at this time. No orders given at this time. Oncoming RN made aware.   Oshen Wlodarczyk [employer]

## 2018-04-25 NOTE — Evaluation (Signed)
Physical Therapy Evaluation Patient Details Name: Andrew Gibbs MRN: [id number] DOB: 08/19/1955 Today's Date: 04/25/2018   History of Present Illness  Pt is a 62 y.o male presenting with abscess of right upper arm and forearm, IV drug abuse ([place]), chronic hepatitis C, and sepsis. PMH significant for hep C and heroin abuse.   Clinical Impression  Pt awake and pleasant upon arrival eager to work with PT. Pt currently [facility] for all strength and independent with basic bed mobility. No physical assist required for sit to stand transfers or ambulation only CGA for safety. Pt ambulating 350 feet with good effort and improved compared to baseline with pt reporting just a few weeks ago he could barely walk. Pt experiencing moderate pain in low back but not limiting current functional abilities during evaluation. No skilled needs pt is safe with to ambulate with nursing (nursing notified). Will complete PT orders.     Follow Up Recommendations No PT follow up    Equipment Recommendations       Recommendations for Other Services       Precautions / Restrictions Precautions Precautions: Fall Restrictions Weight Bearing Restrictions: No      Mobility  Bed Mobility Overal bed mobility: Modified Independent             General bed mobility comments: good effort no assist needed able to perform with only increased time required  Transfers Overall transfer level: Needs assistance Equipment used: Rolling walker (2 wheeled) Transfers: Sit to/from Stand Sit to Stand: Min guard         General transfer comment: min guard for safety no physical assist required pt demonstrating no physical limitations with sit stand   Ambulation/Gait Ambulation/Gait assistance: Min guard Gait Distance (Feet): 350 Feet Assistive device: Rolling walker (2 wheeled)       General Gait Details: Min gaurd only for safety however pt with no LOB, RW utilized for on 40 feet and pt ambulating remainder of 310  feet with no AD. Pt subjectively reporting improved ambulation compared to just a few weeks ago.   Stairs            Wheelchair Mobility    Modified Rankin (Stroke Patients Only)       Balance Overall balance assessment: No apparent balance deficits (not formally assessed)                                           Pertinent Vitals/Pain Pain Assessment: 0-10 Pain Score: 6  Pain Location: low back Pain Intervention(s): Monitored during session;Patient requesting pain meds-RN notified    Home Living Family/patient expects to be discharged to:: Private residence Living Arrangements: Alone Available Help at Discharge: Family;Available PRN/intermittently Type of Home: House Home Access: Stairs to enter Entrance Stairs-Rails: None Entrance Stairs-Number of Steps: 2 Home Layout: One level Home Equipment: None      Prior Function Level of Independence: Independent               Hand Dominance        Extremity/Trunk Assessment   Upper Extremity Assessment Upper Extremity Assessment: Overall WFL for tasks assessed    Lower Extremity Assessment Lower Extremity Assessment: Overall WFL for tasks assessed       Communication   Communication: No difficulties  Cognition Arousal/Alertness: Awake/alert Behavior During Therapy: WFL for tasks assessed/performed Overall Cognitive Status: Within Functional Limits for tasks  assessed                                        General Comments General comments (skin integrity, edema, etc.): HR mid 80's SpO2 mid 90's during ambulation    Exercises     Assessment/Plan    PT Assessment [place] does not need any further PT services  PT Problem List         PT Treatment Interventions      PT Goals (Current goals can be found in the Care Plan section)  Acute Rehab PT Goals Patient Stated Goal: get better and go home PT Goal Formulation: With patient Time For Goal Achievement:  05/09/18 Potential to Achieve Goals: Good    Frequency     Barriers to discharge        Co-evaluation               AM-PAC PT "6 Clicks" Daily Activity  Outcome Measure Difficulty turning over in bed (including adjusting bedclothes, sheets and blankets)?: None Difficulty moving from lying on back to sitting on the side of the bed? : None Difficulty sitting down on and standing up from a chair with arms (e.g., wheelchair, bedside commode, etc,.)?: None Help needed moving to and from a bed to chair (including a wheelchair)?: None Help needed walking in hospital room?: None Help needed climbing 3-5 steps with a railing? : A Little 6 Click Score: 23    End of Session Equipment Utilized During Treatment: Gait belt Activity Tolerance: Patient tolerated treatment well Patient left: with call bell/phone within reach;with chair alarm set;in chair Nurse Communication: Mobility status      Time: 1347-1415 PT Time Calculation (min) (ACUTE ONLY): 28 min   Charges:              Ernie Avena, SPT 04/25/2018, 2:42 PM

## 2018-04-25 NOTE — Progress Notes (Signed)
Patient ID: Andrew Gibbs, male   DOB: Mar 04, 1956, 62 y.o.   MRN: [id number]  [employer] PROGRESS NOTE  Andrew Gibbs YKZ:[id number] DOB: [birth year] DOA: 04/17/2018 PCP: Patient, No Pcp Per  HPI/Subjective:  denies any complaints   Objective: Vitals:   04/25/18 0528 04/25/18 1121  BP: (!) 158/77 (!) 166/72  Pulse: 67 64  Resp:    Temp: 98.6 F (37 C) 98.4 F (36.9 C)  SpO2: 98% 96%    Filed Weights   04/23/18 0500 04/23/18 2043 04/25/18 0528  Weight: 65.9 kg 65 kg 64 kg    ROS: Review of Systems  Constitutional: Negative for chills and fever.  Eyes: Negative for blurred vision.  Respiratory: Negative for cough and shortness of breath.   Cardiovascular: Negative for chest pain.  Gastrointestinal: Negative for abdominal pain, constipation, diarrhea, nausea and vomiting.  Genitourinary: Negative for dysuria.  Musculoskeletal: Negative for back pain, joint pain and myalgias.  Neurological: Negative for dizziness and headaches.   Exam: Physical Exam  HENT:  Nose: No mucosal edema.  Mouth/Throat: No oropharyngeal exudate or posterior oropharyngeal edema.  Eyes: Pupils are equal, round, and reactive to light. Conjunctivae, EOM and lids are normal.  Neck: No JVD present. Carotid bruit is not present. No edema present. No thyroid mass and no thyromegaly present.  Cardiovascular: S1 normal and S2 normal. Exam reveals no gallop.  No murmur heard. Pulses:      Dorsalis pedis pulses are 2+ on the right side, and 2+ on the left side.  Respiratory: No respiratory distress. He has no wheezes. He has no rhonchi. He has no rales.  GI: Soft. Bowel sounds are normal. He exhibits distension. There is no tenderness.  Musculoskeletal:       Right elbow: He exhibits swelling.       Left elbow: He exhibits swelling.       Right ankle: He exhibits no swelling.       Left ankle: He exhibits no swelling.  Lymphadenopathy:    He has no cervical adenopathy.  Neurological: He is  alert. No cranial nerve deficit.  Skin: Skin is warm. Nails show no clubbing.  Numerous areas of small little furuncles on bilateral arms.  On the right arm he does have an open tract.  I do not see any specific abscess.  Psychiatric: He has a normal mood and affect.      Data Reviewed: Basic Metabolic Panel: Recent Labs  Lab 04/21/18 0347 04/24/18 0515  NA 136 135  K 3.7 3.9  CL 115* 108  CO2 20* 23  GLUCOSE 104* 92  BUN 11 <5*  CREATININE 0.56* 0.50*  CALCIUM 7.6* 7.8*   Liver Function Tests: No results for input(s): AST, ALT, ALKPHOS, BILITOT, PROT, ALBUMIN in the last 168 hours. No results for input(s): LIPASE, AMYLASE in the last 168 hours. CBC: Recent Labs  Lab 04/21/18 0347 04/24/18 0515  WBC 4.5 4.1  HGB 10.3* 10.8*  HCT 30.5* 32.2*  MCV 99.4 99.1  PLT 48* 52*   Cardiac Enzymes: No results for input(s): CKTOTAL, CKMB, CKMBINDEX, TROPONINI in the last 168 hours.   Recent Results (from the past 240 hour(s))  Urine culture     Status: None   Collection Time: 04/17/18 12:20 PM  Result Value Ref Range Status   Specimen Description   Final    URINE, RANDOM Performed at [facility], [address], [place], [place] [address]    Special Requests   Final  NONE Performed at [facility], [address], [place], [place] [address]    Culture   Final    NO GROWTH Performed at [facility], [place] [address] [place], [place] [address]    Report Status 04/19/2018 FINAL  Final  Blood Culture (routine x 2)     Status: Abnormal   Collection Time: 04/17/18 12:33 PM  Result Value Ref Range Status   Specimen Description   Final    BLOOD RIGHT HAND Performed at [facility], [address] [place], [place] [address]    Special Requests   Final    BOTTLES DRAWN AEROBIC AND ANAEROBIC Blood Culture adequate volume Performed at [facility], [address], [place], [place] [address]    Culture  Setup Time   Final     GRAM NEGATIVE RODS YEAST IN BOTH AEROBIC AND ANAEROBIC BOTTLES CRITICAL VALUE NOTED.  VALUE IS CONSISTENT WITH PREVIOUSLY REPORTED AND CALLED VALUE. Performed at [facility], [address] [place], [place] [address]    Culture (A)  Final    FLAVOBACTERIUM INDOLOGENES CANDIDA LUSITANIAE STENOTROPHOMONAS MALTOPHILIA SUSCEPTIBILITIES PERFORMED ON PREVIOUS CULTURE WITHIN THE LAST 5 DAYS. Performed at [facility], [place] [address], [place], [place] [address]    Report Status 04/24/2018 FINAL  Final   Organism ID, Bacteria FLAVOBACTERIUM INDOLOGENES  Final      Susceptibility   Flavobacterium indologenes - MIC*    CEFAZOLIN >=64 RESISTANT Resistant     CEFEPIME >=64 RESISTANT Resistant     CEFTAZIDIME >=64 RESISTANT Resistant     CEFTRIAXONE >=64 RESISTANT Resistant     CIPROFLOXACIN 1 SENSITIVE Sensitive     GENTAMICIN >=16 RESISTANT Resistant     IMIPENEM >=16 RESISTANT Resistant     TRIMETH/SULFA <=20 SENSITIVE Sensitive     PIP/TAZO >=128 RESISTANT Resistant     * FLAVOBACTERIUM INDOLOGENES  Wound or Superficial Culture     Status: None   Collection Time: 04/17/18 12:33 PM  Result Value Ref Range Status   Specimen Description   Final    ARM RIGHT UPPER ARM Performed at [facility], [address], [place], [place] [address]    Special Requests   Final    NONE Performed at [facility], [place], [place] [address]    Gram Stain   Final    NO WBC SEEN FEW GRAM POSITIVE COCCI FEW GRAM NEGATIVE RODS RARE GRAM POSITIVE RODS Performed at [facility], [place] [address] [place],  [address]    Culture   Final    FEW STAPHYLOCOCCUS AUREUS FEW SERRATIA MARCESCENS    Report Status 04/21/2018 FINAL  Final   Organism ID, Bacteria SERRATIA MARCESCENS  Final   Organism ID, Bacteria STAPHYLOCOCCUS AUREUS  Final      Susceptibility   Staphylococcus aureus - MIC*    CIPROFLOXACIN <=0.5 SENSITIVE Sensitive      ERYTHROMYCIN <=0.25 SENSITIVE Sensitive     GENTAMICIN <=0.5 SENSITIVE Sensitive     OXACILLIN <=0.25 SENSITIVE Sensitive     TETRACYCLINE <=1 SENSITIVE Sensitive     VANCOMYCIN 1 SENSITIVE Sensitive     TRIMETH/SULFA <=10 SENSITIVE Sensitive     CLINDAMYCIN <=0.25 SENSITIVE Sensitive     RIFAMPIN <=0.5 SENSITIVE Sensitive     Inducible Clindamycin NEGATIVE Sensitive     * FEW STAPHYLOCOCCUS AUREUS   Serratia marcescens - MIC*    CEFAZOLIN >=64 RESISTANT Resistant     CEFEPIME <=1  SENSITIVE Sensitive     CEFTAZIDIME <=1 SENSITIVE Sensitive     CEFTRIAXONE <=1 SENSITIVE Sensitive     CIPROFLOXACIN <=0.25 SENSITIVE Sensitive     GENTAMICIN <=1 SENSITIVE Sensitive     TRIMETH/SULFA <=20 SENSITIVE Sensitive     * FEW SERRATIA MARCESCENS  Blood Culture (routine x 2)     Status: Abnormal (Preliminary result)   Collection Time: 04/17/18  1:05 PM  Result Value Ref Range Status   Specimen Description   Final    BLOOD LEFT ARM Performed at [facility], [address] [place], [place] [address]    Special Requests   Final    BOTTLES DRAWN AEROBIC AND ANAEROBIC Blood Culture adequate volume Performed at [facility], [address] [place], [place] [address]    Culture  Setup Time   Final    YEAST AEROBIC BOTTLE ONLY CRITICAL RESULT CALLED TO, READ BACK BY AND VERIFIED WITH: JASON ROBBINS AT 0086 ON 04/18/18 [place]. GRAM NEGATIVE RODS ANAEROBIC BOTTLE ONLY CRITICAL RESULT CALLED TO, READ BACK BY AND VERIFIED WITH: [place] @1917  04/18/18 [place]    Culture (A)  Final    CANDIDA LUSITANIAE STENOTROPHOMONAS MALTOPHILIA ORGANISM 1 Sent to [place] for further susceptibility testing. Performed at [facility], [place] [address], [place], [place] [address]    Report Status PENDING  Incomplete   Organism ID, Bacteria STENOTROPHOMONAS MALTOPHILIA  Final      Susceptibility   Stenotrophomonas maltophilia - MIC*    LEVOFLOXACIN 0.5 SENSITIVE Sensitive      TRIMETH/SULFA <=20 SENSITIVE Sensitive     * STENOTROPHOMONAS MALTOPHILIA  Blood Culture ID Panel (Reflexed)     Status: None   Collection Time: 04/17/18  1:05 PM  Result Value Ref Range Status   Enterococcus species NOT DETECTED NOT DETECTED Final   Listeria monocytogenes NOT DETECTED NOT DETECTED Final   Staphylococcus species NOT DETECTED NOT DETECTED Final   Staphylococcus aureus (BCID) NOT DETECTED NOT DETECTED Final   Streptococcus species NOT DETECTED NOT DETECTED Final   Streptococcus agalactiae NOT DETECTED NOT DETECTED Final   Streptococcus pneumoniae NOT DETECTED NOT DETECTED Final   Streptococcus pyogenes NOT DETECTED NOT DETECTED Final   Acinetobacter baumannii NOT DETECTED NOT DETECTED Final   Enterobacteriaceae species NOT DETECTED NOT DETECTED Final   Enterobacter cloacae complex NOT DETECTED NOT DETECTED Final   Escherichia coli NOT DETECTED NOT DETECTED Final   Klebsiella oxytoca NOT DETECTED NOT DETECTED Final   Klebsiella pneumoniae NOT DETECTED NOT DETECTED Final   Proteus species NOT DETECTED NOT DETECTED Final   Serratia marcescens NOT DETECTED NOT DETECTED Final   Haemophilus influenzae NOT DETECTED NOT DETECTED Final   Neisseria meningitidis NOT DETECTED NOT DETECTED Final   Pseudomonas aeruginosa NOT DETECTED NOT DETECTED Final   Candida albicans NOT DETECTED NOT DETECTED Final   Candida glabrata NOT DETECTED NOT DETECTED Final   Candida krusei NOT DETECTED NOT DETECTED Final   Candida parapsilosis NOT DETECTED NOT DETECTED Final   Candida tropicalis NOT DETECTED NOT DETECTED Final    Comment: Performed at [facility], [address] [place], [place] [address]  Blood Culture ID Panel (Reflexed)     Status: None   Collection Time: 04/17/18  3:04 PM  Result Value Ref Range Status   Enterococcus species NOT DETECTED NOT DETECTED Final   Listeria monocytogenes NOT DETECTED NOT DETECTED Final   Staphylococcus species NOT DETECTED NOT DETECTED Final    Staphylococcus aureus (BCID) NOT DETECTED NOT  DETECTED Final   Streptococcus species NOT DETECTED NOT DETECTED Final   Streptococcus agalactiae NOT DETECTED NOT DETECTED Final   Streptococcus pneumoniae NOT DETECTED NOT DETECTED Final   Streptococcus pyogenes NOT DETECTED NOT DETECTED Final   Acinetobacter baumannii NOT DETECTED NOT DETECTED Final   Enterobacteriaceae species NOT DETECTED NOT DETECTED Final   Enterobacter cloacae complex NOT DETECTED NOT DETECTED Final   Escherichia coli NOT DETECTED NOT DETECTED Final   Klebsiella oxytoca NOT DETECTED NOT DETECTED Final   Klebsiella pneumoniae NOT DETECTED NOT DETECTED Final   Proteus species NOT DETECTED NOT DETECTED Final   Serratia marcescens NOT DETECTED NOT DETECTED Final   Haemophilus influenzae NOT DETECTED NOT DETECTED Final   Neisseria meningitidis NOT DETECTED NOT DETECTED Final   Pseudomonas aeruginosa NOT DETECTED NOT DETECTED Final   Candida albicans NOT DETECTED NOT DETECTED Final   Candida glabrata NOT DETECTED NOT DETECTED Final   Candida krusei NOT DETECTED NOT DETECTED Final   Candida parapsilosis NOT DETECTED NOT DETECTED Final   Candida tropicalis NOT DETECTED NOT DETECTED Final    Comment: Performed at [facility], [address] [place], [place] [address]  CULTURE, BLOOD (ROUTINE X 2) w Reflex to ID Panel     Status: None   Collection Time: 04/19/18 12:44 AM  Result Value Ref Range Status   Specimen Description BLOOD BLOOD LEFT HAND  Final   Special Requests   Final    BOTTLES DRAWN AEROBIC AND ANAEROBIC Blood Culture adequate volume   Culture   Final    NO GROWTH 5 DAYS Performed at [facility], [address] [place], [place] [address]    Report Status 04/24/2018 FINAL  Final  CULTURE, BLOOD (ROUTINE X 2) w Reflex to ID Panel     Status: None   Collection Time: 04/19/18 12:46 AM  Result Value Ref Range Status   Specimen Description BLOOD BLOOD RIGHT HAND  Final   Special  Requests   Final    BOTTLES DRAWN AEROBIC AND ANAEROBIC Blood Culture adequate volume   Culture   Final    NO GROWTH 5 DAYS Performed at [facility], [address], [place], [place] [address]    Report Status 04/24/2018 FINAL  Final     Studies: No results found.  Scheduled Meds: . docusate sodium  100 mg Oral BID  . feeding supplement (ENSURE ENLIVE)  237 mL Oral BID BM  . multivitamin with minerals  1 tablet Oral Daily  . vitamin C  250 mg Oral BID   Continuous Infusions: . 0.9 % NaCl with KCl 20 mEq / L 100 mL/hr at 04/25/18 0906  . anidulafungin Stopped (04/24/18 2142)  . levofloxacin (LEVAQUIN) IV 750 mg (04/25/18 1132)  . vancomycin Stopped (04/25/18 0600)    Assessment/Plan:  1. Clinical sepsis.  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 Appreciate infectious disease consultation and continue IV Anidulafungin, vancomycin and levaquin for right now.  TEE shows small endocarditis-continue antibiotics as per ID  2. liver mass consistent with hepatocellular cancer oncology has seen the patient outpatient follow-up once current clinical situation improved 3. 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   4. Back pain.  MRI of the thoracic spine secondary to back pain.  Shows discitis treatment as per #1 5. Anemia of chronic disease.  Status post 1 unit of packed RBCs follow cbc 6. patient must stop IV drug use prognosis is now very poor in light  of hepatocellular cancer  Code Status:     Code Status Orders  (From admission, onward)         Start     Ordered   04/17/18 1446  Full code  Continuous     04/17/18 1446        Code Status History    This patient has a current code status but no historical code status.      Disposition Plan: pending antibiotic treatment and final recommendation per id  Antibiotics:  Anidalfungin  Vancomycin  Levaquin  Time spent: 28 minutes, [place]

## 2018-04-25 NOTE — Progress Notes (Signed)
Initial Nutrition Assessment  DOCUMENTATION CODES:   Non-severe (moderate) malnutrition in context of chronic illness  INTERVENTION:   Ensure Enlive po BID, each supplement provides 350 kcal and 20 grams of protein  MVI daily  Vitamin C 267m po BID  Magic cup TID with meals, each supplement provides 290 kcal and 9 grams of protein  Pt likely at moderate refeeding risk; recommend monitor K, Mg and P labs   NUTRITION DIAGNOSIS:   Moderate Malnutrition related to chronic illness(Cirrhosis, IV drug abuse, probable hepatocellular cancer ) as evidenced by moderate fat depletion, moderate muscle depletion.  GOAL:   Patient will meet greater than or equal to 90% of their needs  MONITOR:   PO intake, Supplement acceptance, Labs, Weight trends, Skin, I & O's  REASON FOR ASSESSMENT:   Malnutrition Screening Tool    ASSESSMENT:   62y.o. male with no significant past ocular history, has a complex medical history significant IV heroin drug abuse and hepatitis, now hospitalized for sepsis with blood cultures positive for yeast and bacteria, recently diagnosed with hepatocellular carcinoma.   Met with pt in room today. Pt reports poor appetite and oral intake for several weeks pta. Pt reports his appetite is starting to improve; pt eating ~50% of meals in hospital. Pt has been refusing the Ensure. RD had a long discussion with pt today regarding the importance of adequate nutrition needed for wound healing and to preserve lean muscle. Pt reports he is willing to drink the Ensure "if it will hep me". RD will add vitamin C to promote wound healing. Per chart, pt is weight stable since admit. Pt also reports his weight is stable pta. Recommend continue supplements and vitamin throughout hospital stay. Pt likely at refeeding risk; recommend monitor K, Mg and P labs until stable.    Medications reviewed and include: colace, MVI, NaCl w/ KCl _0 /hr, vancomycin  Labs reviewed: BUN <5(L),  creat 0.50(L)- 10/9  NUTRITION - FOCUSED PHYSICAL EXAM:    Most Recent Value  Orbital Region  Mild depletion  Upper Arm Region  Moderate depletion  Thoracic and Lumbar Region  Moderate depletion  Buccal Region  Moderate depletion  Temple Region  Moderate depletion  Clavicle Bone Region  Moderate depletion  Clavicle and Acromion Bone Region  Moderate depletion  Scapular Bone Region  Moderate depletion  Dorsal Hand  No depletion  Patellar Region  Severe depletion  Anterior Thigh Region  Severe depletion  Posterior Calf Region  Severe depletion  Edema (RD Assessment)  Mild [LUE]  Hair  Reviewed  Eyes  Reviewed  Mouth  Reviewed  Skin  Reviewed  Nails  Reviewed     Diet Order:   Diet Order            Diet regular Room service appropriate? Yes; Fluid consistency: Thin  Diet effective now             EDUCATION NEEDS:   Education needs have been addressed  Skin:  Skin Assessment: Reviewed RN Assessment(abscess arm)  Last BM:  10/8- type 7  Height:   Ht Readings from Last 1 Encounters:  04/17/18 _1  (1.702 m)    Weight:   Wt Readings from Last 1 Encounters:  04/25/18 64 kg    Ideal Body Weight:  67 kg  BMI:  Body mass index is 22.1 kg/m.  Estimated Nutritional Needs:   Kcal:  1800-2100kcal/day   Protein:  83-96g/day   Fluid:  >1.8L/day   CKoleen DistanceMS, RD, LDN  Pager #- [phone] Office#- [phone] After Hours Pager: [phone]  

## 2018-04-26 LAB — VANCOMYCIN, TROUGH: VANCOMYCIN TR: 11 ug/mL — AB (ref 15–20)

## 2018-04-26 LAB — CREATININE, SERUM
CREATININE: 0.66 mg/dL (ref 0.61–1.24)
GFR calc Af Amer: >60 mL/min (ref >60)

## 2018-04-26 MED ORDER — HYDRALAZINE HCL 20 MG/ML IJ SOLN
10.0000 mg | Freq: Four times a day (QID) | INTRAMUSCULAR | Status: DC | PRN
  Administered 2018-04-26: 10 mg via INTRAVENOUS
  Filled 2018-04-26: qty 1

## 2018-04-26 NOTE — Progress Notes (Signed)
   04/26/18 1700  Clinical Encounter Type  Visited With Patient;Family  Visit Type Follow-up  Recommendations Complete AD when notraries are available.  Stress Factors  Patient Stress Factors  (AD completion)  Family Stress Factors  (AD completion)  Advance Directives (For Healthcare)  Does Patient Have a Medical Advance Directive? No  Mental Health Advance Directives  Does Patient Have a Mental Health Advance Directive? No   Chaplain attempted to complete the AD by securing a notary and witnesses for documentation. A notary was not available. Chaplain will attempt to complete the AD at a later time.

## 2018-04-26 NOTE — Progress Notes (Signed)
Pharmacy Antibiotic Note  Andrew Gibbs is a 62 y.o. male admitted on 04/17/2018 with sepsis.  Pharmacy has been consulted for  vancomycin dosing.  Plan: 10/11 14:34 vancomycin trough returned 11 mcg/ml. Continue current regimen of vancomycin 1 gm IV Q12H. Vancomycin is scheduled to end 10/14 per ID. Pharmacy will continue to monitor SCr as available but will not check another level unless therapy is extended past 10/14.    Height: 5\' 7"  (170.2 cm) Weight: 143 lb 8.3 oz (65.1 kg) IBW/kg (Calculated) : 66.1  Temp (24hrs), Avg:100 F (37.8 C), Min:99.4 F (37.4 C), Max:100.8 F (38.2 C)  Recent Labs  Lab 04/21/18 0347  04/24/18 0515 04/26/18 1434  WBC 4.5  --  4.1  --   CREATININE 0.56*  --  0.50* 0.66  VANCOTROUGH  --    < > 21* 11*   < > = values in this interval not displayed.    Estimated Creatinine Clearance: 88.2 mL/min (by C-G formula based on SCr of 0.66 mg/dL).    No Known Allergies  Antimicrobials this admission:   >>   >>   Dose adjustments this admission:  Microbiology results:  BCx:  UCx:    Sputum:   MRSA PCR:   Thank you for allowing pharmacy to be a part of this patient's care.  Laural Benes, Pharm.D., BCPS Clinical Pharmacist 04/26/2018 3:38 PM

## 2018-04-26 NOTE — Progress Notes (Signed)
Patient ID: Andrew Gibbs, male   DOB: 1956-01-10, 62 y.o.   MRN: [id number]  [facility] PROGRESS NOTE  ESTEPHAN Gibbs VCB:[id number] DOB: 1956-02-14 DOA: 04/17/2018 PCP: Patient, No Pcp Per  HPI/Subjective: Patient denies any complaints still requiring IV antibiotics   Objective: Vitals:   04/25/18 2211 04/26/18 0508  BP:  (!) 157/65  Pulse:  73  Resp:  20  Temp: 100.1 F (37.8 C) 99.4 F (37.4 C)  SpO2:  94%    Filed Weights   04/23/18 2043 04/25/18 0528 04/26/18 0508  Weight: 65 kg 64 kg 65.1 kg    ROS: Review of Systems  Constitutional: Negative for chills and fever.  Eyes: Negative for blurred vision.  Respiratory: Negative for cough and shortness of breath.   Cardiovascular: Negative for chest pain.  Gastrointestinal: Negative for abdominal pain, constipation, diarrhea, nausea and vomiting.  Genitourinary: Negative for dysuria.  Musculoskeletal: Negative for back pain, joint pain and myalgias.  Neurological: Negative for dizziness and headaches.   Exam: Physical Exam  HENT:  Nose: No mucosal edema.  Mouth/Throat: No oropharyngeal exudate or posterior oropharyngeal edema.  Eyes: Pupils are equal, round, and reactive to light. Conjunctivae, EOM and lids are normal.  Neck: No JVD present. Carotid bruit is not present. No edema present. No thyroid mass and no thyromegaly present.  Cardiovascular: S1 normal and S2 normal. Exam reveals no gallop.  No murmur heard. Pulses:      Dorsalis pedis pulses are 2+ on the right side, and 2+ on the left side.  Respiratory: No respiratory distress. He has no wheezes. He has no rhonchi. He has no rales.  GI: Soft. Bowel sounds are normal. He exhibits distension. There is no tenderness.  Musculoskeletal:       Right elbow: He exhibits swelling.       Left elbow: He exhibits swelling.       Right ankle: He exhibits no swelling.       Left ankle: He exhibits no swelling.  Lymphadenopathy:    He has no cervical  adenopathy.  Neurological: He is alert. No cranial nerve deficit.  Skin: Skin is warm. Nails show no clubbing.  Numerous areas of small little furuncles on bilateral arms.  On the right arm he does have an open tract.  I do not see any specific abscess.  Psychiatric: He has a normal mood and affect.      Data Reviewed: Basic Metabolic Panel: Recent Labs  Lab 04/21/18 0347 04/24/18 0515  NA 136 135  K 3.7 3.9  CL 115* 108  CO2 20* 23  GLUCOSE 104* 92  BUN 11 <5*  CREATININE 0.56* 0.50*  CALCIUM 7.6* 7.8*   Liver Function Tests: No results for input(s): AST, ALT, ALKPHOS, BILITOT, PROT, ALBUMIN in the last 168 hours. No results for input(s): LIPASE, AMYLASE in the last 168 hours. CBC: Recent Labs  Lab 04/21/18 0347 04/24/18 0515  WBC 4.5 4.1  HGB 10.3* 10.8*  HCT 30.5* 32.2*  MCV 99.4 99.1  PLT 48* 52*   Cardiac Enzymes: No results for input(s): CKTOTAL, CKMB, CKMBINDEX, TROPONINI in the last 168 hours.   Recent Results (from the past 240 hour(s))  Urine culture     Status: None   Collection Time: 04/17/18 12:20 PM  Result Value Ref Range Status   Specimen Description   Final    URINE, RANDOM Performed at [facility], [address], [place], [place] [address]    Special Requests  Final    NONE Performed at [facility], [address] [place], [place] [address]    Culture   Final    NO GROWTH Performed at [facility], [place] [address] [place], [place] [address]    Report Status 04/19/2018 FINAL  Final  Blood Culture (routine x 2)     Status: Abnormal   Collection Time: 04/17/18 12:33 PM  Result Value Ref Range Status   Specimen Description   Final    BLOOD RIGHT HAND Performed at [facility], [address], [place], [place] [address]    Special Requests   Final    BOTTLES DRAWN AEROBIC AND ANAEROBIC Blood Culture adequate volume Performed at [facility], [address], [place], [place] [address]     Culture  Setup Time   Final    GRAM NEGATIVE RODS YEAST IN BOTH AEROBIC AND ANAEROBIC BOTTLES CRITICAL VALUE NOTED.  VALUE IS CONSISTENT WITH PREVIOUSLY REPORTED AND CALLED VALUE. Performed at [facility], [address] [place], [place] [address]    Culture (A)  Final    FLAVOBACTERIUM INDOLOGENES CANDIDA LUSITANIAE STENOTROPHOMONAS MALTOPHILIA SUSCEPTIBILITIES PERFORMED ON PREVIOUS CULTURE WITHIN THE LAST 5 DAYS. Performed at [facility], [place] [address], [place], [place] [address]    Report Status 04/24/2018 FINAL  Final   Organism ID, Bacteria FLAVOBACTERIUM INDOLOGENES  Final      Susceptibility   Flavobacterium indologenes - MIC*    CEFAZOLIN >=64 RESISTANT Resistant     CEFEPIME >=64 RESISTANT Resistant     CEFTAZIDIME >=64 RESISTANT Resistant     CEFTRIAXONE >=64 RESISTANT Resistant     CIPROFLOXACIN 1 SENSITIVE Sensitive     GENTAMICIN >=16 RESISTANT Resistant     IMIPENEM >=16 RESISTANT Resistant     TRIMETH/SULFA <=20 SENSITIVE Sensitive     PIP/TAZO >=128 RESISTANT Resistant     * FLAVOBACTERIUM INDOLOGENES  Wound or Superficial Culture     Status: None   Collection Time: 04/17/18 12:33 PM  Result Value Ref Range Status   Specimen Description   Final    ARM RIGHT UPPER ARM Performed at [facility], [address], [place], [place] [address]    Special Requests   Final    NONE Performed at [facility], [place], [place] [address]    Gram Stain   Final    NO WBC SEEN FEW GRAM POSITIVE COCCI FEW GRAM NEGATIVE RODS RARE GRAM POSITIVE RODS Performed at [facility], [place] [address], [place], [place] [address]    Culture   Final    FEW STAPHYLOCOCCUS AUREUS FEW SERRATIA MARCESCENS    Report Status 04/21/2018 FINAL  Final   Organism ID, Bacteria SERRATIA MARCESCENS  Final   Organism ID, Bacteria STAPHYLOCOCCUS AUREUS  Final      Susceptibility   Staphylococcus aureus - MIC*    CIPROFLOXACIN  <=0.5 SENSITIVE Sensitive     ERYTHROMYCIN <=0.25 SENSITIVE Sensitive     GENTAMICIN <=0.5 SENSITIVE Sensitive     OXACILLIN <=0.25 SENSITIVE Sensitive     TETRACYCLINE <=1 SENSITIVE Sensitive     VANCOMYCIN 1 SENSITIVE Sensitive     TRIMETH/SULFA <=10 SENSITIVE Sensitive     CLINDAMYCIN <=0.25 SENSITIVE Sensitive     RIFAMPIN <=0.5 SENSITIVE Sensitive     Inducible Clindamycin NEGATIVE Sensitive     * FEW STAPHYLOCOCCUS AUREUS   Serratia marcescens - MIC*    CEFAZOLIN >=64 RESISTANT Resistant  CEFEPIME <=1 SENSITIVE Sensitive     CEFTAZIDIME <=1 SENSITIVE Sensitive     CEFTRIAXONE <=1 SENSITIVE Sensitive     CIPROFLOXACIN <=0.25 SENSITIVE Sensitive     GENTAMICIN <=1 SENSITIVE Sensitive     TRIMETH/SULFA <=20 SENSITIVE Sensitive     * FEW SERRATIA MARCESCENS  Antifungal Suscep, Fluconazole     Status: None (Preliminary result)   Collection Time: 04/17/18  1:00 PM  Result Value Ref Range Status   Organism ID, Yeast Preliminary report  Final    Comment: (NOTE) Specimen has been received and testing has been initiated. Performed At: [facility] [place], [place] [id number] Rush Farmer MD TK:[id number]    Fluconazole Islt MIC PENDING  Incomplete  Blood Culture (routine x 2)     Status: Abnormal (Preliminary result)   Collection Time: 04/17/18  1:05 PM  Result Value Ref Range Status   Specimen Description   Final    BLOOD LEFT ARM Performed at [facility], [address], [place], [place] [address]    Special Requests   Final    BOTTLES DRAWN AEROBIC AND ANAEROBIC Blood Culture adequate volume Performed at [facility], [address] [place], [place] [address]    Culture  Setup Time   Final    YEAST AEROBIC BOTTLE ONLY CRITICAL RESULT CALLED TO, READ BACK BY AND VERIFIED WITH: JASON ROBBINS AT 7062 ON 04/18/18 [place]. GRAM NEGATIVE RODS ANAEROBIC BOTTLE ONLY CRITICAL RESULT CALLED TO, READ BACK BY AND VERIFIED WITH: Allen @1917  04/18/18 [place]    Culture (A)  Final    CANDIDA LUSITANIAE STENOTROPHOMONAS MALTOPHILIA ORGANISM 1 Sent to [place] for further susceptibility testing. Performed at [facility], [place] [address], [place], [place] [address]    Report Status PENDING  Incomplete   Organism ID, Bacteria STENOTROPHOMONAS MALTOPHILIA  Final      Susceptibility   Stenotrophomonas maltophilia - MIC*    LEVOFLOXACIN 0.5 SENSITIVE Sensitive     TRIMETH/SULFA <=20 SENSITIVE Sensitive     * STENOTROPHOMONAS MALTOPHILIA  Blood Culture ID Panel (Reflexed)     Status: None   Collection Time: 04/17/18  1:05 PM  Result Value Ref Range Status   Enterococcus species NOT DETECTED NOT DETECTED Final   Listeria monocytogenes NOT DETECTED NOT DETECTED Final   Staphylococcus species NOT DETECTED NOT DETECTED Final   Staphylococcus aureus (BCID) NOT DETECTED NOT DETECTED Final   Streptococcus species NOT DETECTED NOT DETECTED Final   Streptococcus agalactiae NOT DETECTED NOT DETECTED Final   Streptococcus pneumoniae NOT DETECTED NOT DETECTED Final   Streptococcus pyogenes NOT DETECTED NOT DETECTED Final   Acinetobacter baumannii NOT DETECTED NOT DETECTED Final   Enterobacteriaceae species NOT DETECTED NOT DETECTED Final   Enterobacter cloacae complex NOT DETECTED NOT DETECTED Final   Escherichia coli NOT DETECTED NOT DETECTED Final   Klebsiella oxytoca NOT DETECTED NOT DETECTED Final   Klebsiella pneumoniae NOT DETECTED NOT DETECTED Final   Proteus species NOT DETECTED NOT DETECTED Final   Serratia marcescens NOT DETECTED NOT DETECTED Final   Haemophilus influenzae NOT DETECTED NOT DETECTED Final   Neisseria meningitidis NOT DETECTED NOT DETECTED Final   Pseudomonas aeruginosa NOT DETECTED NOT DETECTED Final   Candida albicans NOT DETECTED NOT DETECTED Final   Candida glabrata NOT DETECTED NOT DETECTED Final   Candida krusei NOT DETECTED NOT DETECTED Final   Candida parapsilosis NOT DETECTED NOT  DETECTED Final   Candida tropicalis NOT DETECTED NOT DETECTED Final  Comment: Performed at [facility], [address] [place], [place] [address]  Blood Culture ID Panel (Reflexed)     Status: None   Collection Time: 04/17/18  3:04 PM  Result Value Ref Range Status   Enterococcus species NOT DETECTED NOT DETECTED Final   Listeria monocytogenes NOT DETECTED NOT DETECTED Final   Staphylococcus species NOT DETECTED NOT DETECTED Final   Staphylococcus aureus (BCID) NOT DETECTED NOT DETECTED Final   Streptococcus species NOT DETECTED NOT DETECTED Final   Streptococcus agalactiae NOT DETECTED NOT DETECTED Final   Streptococcus pneumoniae NOT DETECTED NOT DETECTED Final   Streptococcus pyogenes NOT DETECTED NOT DETECTED Final   Acinetobacter baumannii NOT DETECTED NOT DETECTED Final   Enterobacteriaceae species NOT DETECTED NOT DETECTED Final   Enterobacter cloacae complex NOT DETECTED NOT DETECTED Final   Escherichia coli NOT DETECTED NOT DETECTED Final   Klebsiella oxytoca NOT DETECTED NOT DETECTED Final   Klebsiella pneumoniae NOT DETECTED NOT DETECTED Final   Proteus species NOT DETECTED NOT DETECTED Final   Serratia marcescens NOT DETECTED NOT DETECTED Final   Haemophilus influenzae NOT DETECTED NOT DETECTED Final   Neisseria meningitidis NOT DETECTED NOT DETECTED Final   Pseudomonas aeruginosa NOT DETECTED NOT DETECTED Final   Candida albicans NOT DETECTED NOT DETECTED Final   Candida glabrata NOT DETECTED NOT DETECTED Final   Candida krusei NOT DETECTED NOT DETECTED Final   Candida parapsilosis NOT DETECTED NOT DETECTED Final   Candida tropicalis NOT DETECTED NOT DETECTED Final    Comment: Performed at [facility], [address] [place], [place] [address]  CULTURE, BLOOD (ROUTINE X 2) w Reflex to ID Panel     Status: None   Collection Time: 04/19/18 12:44 AM  Result Value Ref Range Status   Specimen Description BLOOD BLOOD LEFT HAND  Final   Special  Requests   Final    BOTTLES DRAWN AEROBIC AND ANAEROBIC Blood Culture adequate volume   Culture   Final    NO GROWTH 5 DAYS Performed at [facility], [address] [place], [place] [address]    Report Status 04/24/2018 FINAL  Final  CULTURE, BLOOD (ROUTINE X 2) w Reflex to ID Panel     Status: None   Collection Time: 04/19/18 12:46 AM  Result Value Ref Range Status   Specimen Description BLOOD BLOOD RIGHT HAND  Final   Special Requests   Final    BOTTLES DRAWN AEROBIC AND ANAEROBIC Blood Culture adequate volume   Culture   Final    NO GROWTH 5 DAYS Performed at [facility], [address], [place], [place] [address]    Report Status 04/24/2018 FINAL  Final     Studies: No results found.  Scheduled Meds: . docusate sodium  100 mg Oral BID  . feeding supplement (ENSURE ENLIVE)  237 mL Oral BID BM  . multivitamin with minerals  1 tablet Oral Daily  . vitamin C  250 mg Oral BID   Continuous Infusions: . 0.9 % NaCl with KCl 20 mEq / L Stopped (04/26/18 1111)  . anidulafungin Stopped (04/25/18 2047)  . levofloxacin (LEVAQUIN) IV 100 mL/hr at 04/26/18 1231  . vancomycin Stopped (04/26/18 0423)    Assessment/Plan:  1. 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  TEE shows small endocarditis-continue antibiotics as per ID , after 2 weeks ID may consider oral treatment 2. liver mass consistent with hepatocellular  cancer oncology has seen the patient outpatient follow-up once current clinical situation improved 3. 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   4. Back pain.  MRI of the thoracic spine secondary to back pain.  Shows discitis treatment as per #1 5. Anemia of chronic disease.  Status post 1 unit of packed RBCs follow cbc hemoglobin stable 6. patient must stop IV drug use prognosis is now very  poor in light of hepatocellular cancer  Code Status:     Code Status Orders  (From admission, onward)         Start     Ordered   04/17/18 1446  Full code  Continuous     04/17/18 1446        Code Status History    This patient has a current code status but no historical code status.      Disposition Plan: pending antibiotic treatment and final recommendation per id  Antibiotics:  Anidalfungin  Vancomycin  Levaquin  Time spent: 28 minutes, [place]

## 2018-04-27 MED ORDER — SODIUM CHLORIDE 0.9 % IV SOLN
INTRAVENOUS | Status: DC | PRN
  Administered 2018-04-27 – 2018-05-02 (×3): 250 mL via INTRAVENOUS

## 2018-04-27 NOTE — Progress Notes (Signed)
Patient ID: Andrew Gibbs, male   DOB: 11-15-1955, 62 y.o.   MRN: [id number]  [employer] [facility] PROGRESS NOTE  [name] BJS:[id number] DOB: 1955-09-15 DOA: 04/17/2018 PCP: Patient, No Pcp Per  HPI/Subjective: Patient denies any complaints. still requiring IV antibiotics. Has some low-grade temperature today  Objective: Vitals:   04/27/18 0511 04/27/18 1159  BP: (!) 146/63 (!) 134/50  Pulse: 66 77  Resp: 18 16  Temp: 99.4 F (37.4 C) (!) 100.9 F (38.3 C)  SpO2: 98% 96%    Filed Weights   04/25/18 0528 04/26/18 0508 04/27/18 0511  Weight: 64 kg 65.1 kg 62.5 kg    ROS: Review of Systems  Constitutional: Negative for chills and fever.  Eyes: Negative for blurred vision.  Respiratory: Negative for cough and shortness of breath.   Cardiovascular: Negative for chest pain.  Gastrointestinal: Negative for abdominal pain, constipation, diarrhea, nausea and vomiting.  Genitourinary: Negative for dysuria.  Musculoskeletal: Negative for back pain, joint pain and myalgias.  Neurological: Negative for dizziness and headaches.   Exam: Physical Exam  HENT:  Nose: No mucosal edema.  Mouth/Throat: No oropharyngeal exudate or posterior oropharyngeal edema.  Eyes: Pupils are equal, round, and reactive to light. Conjunctivae, EOM and lids are normal.  Neck: No JVD present. Carotid bruit is not present. No edema present. No thyroid mass and no thyromegaly present.  Cardiovascular: S1 normal and S2 normal. Exam reveals no gallop.  No murmur heard. Pulses:      Dorsalis pedis pulses are 2+ on the right side, and 2+ on the left side.  Respiratory: No respiratory distress. He has no wheezes. He has no rhonchi. He has no rales.  GI: Soft. Bowel sounds are normal. He exhibits distension. There is no tenderness.  Musculoskeletal:       Right elbow: He exhibits swelling.       Left elbow: He exhibits swelling.       Right ankle: He exhibits no swelling.       Left ankle: He exhibits no  swelling.  Lymphadenopathy:    He has no cervical adenopathy.  Neurological: He is alert. No cranial nerve deficit.  Skin: Skin is warm. Nails show no clubbing.  Numerous areas of small little furuncles on bilateral arms.  On the right arm he does have an open tract.  I do not see any specific abscess.  Psychiatric: He has a normal mood and affect.      Data Reviewed: Basic Metabolic Panel: Recent Labs  Lab 04/21/18 0347 04/24/18 0515 04/26/18 1434  NA 136 135  --   K 3.7 3.9  --   CL 115* 108  --   CO2 20* 23  --   GLUCOSE 104* 92  --   BUN 11 <5*  --   CREATININE 0.56* 0.50* 0.66  CALCIUM 7.6* 7.8*  --    Liver Function Tests: No results for input(s): AST, ALT, ALKPHOS, BILITOT, PROT, ALBUMIN in the last 168 hours. No results for input(s): LIPASE, AMYLASE in the last 168 hours. CBC: Recent Labs  Lab 04/21/18 0347 04/24/18 0515  WBC 4.5 4.1  HGB 10.3* 10.8*  HCT 30.5* 32.2*  MCV 99.4 99.1  PLT 48* 52*   Cardiac Enzymes: No results for input(s): CKTOTAL, CKMB, CKMBINDEX, TROPONINI in the last 168 hours.   Recent Results (from the past 240 hour(s))  Antifungal Suscep, Fluconazole     Status: None (Preliminary result)   Collection Time: 04/17/18  1:00 PM  Result Value  Ref Range Status   Organism ID, Yeast Preliminary report  Final    Comment: (NOTE) Specimen has been received and testing has been initiated. Performed At: [facility] [place], [place] [id number] Rush Farmer MD JO:[id number]    Fluconazole Islt MIC PENDING  Incomplete  Blood Culture (routine x 2)     Status: Abnormal (Preliminary result)   Collection Time: 04/17/18  1:05 PM  Result Value Ref Range Status   Specimen Description   Final    BLOOD LEFT ARM Performed at [facility], [address], [place], [place] [address]    Special Requests   Final    BOTTLES DRAWN AEROBIC AND ANAEROBIC Blood Culture adequate volume Performed at [facility],  [address] [place], [place] [address]    Culture  Setup Time   Final    YEAST AEROBIC BOTTLE ONLY CRITICAL RESULT CALLED TO, READ BACK BY AND VERIFIED WITH: JASON ROBBINS AT 7322 ON 04/18/18 [place]. GRAM NEGATIVE RODS ANAEROBIC BOTTLE ONLY CRITICAL RESULT CALLED TO, READ BACK BY AND VERIFIED WITH: [place] @1917  04/18/18 [place]    Culture (A)  Final    CANDIDA LUSITANIAE STENOTROPHOMONAS MALTOPHILIA ORGANISM 1 Sent to [place] for further susceptibility testing. Performed at [facility], [place] [address], [place], [place] [address]    Report Status PENDING  Incomplete   Organism ID, Bacteria STENOTROPHOMONAS MALTOPHILIA  Final      Susceptibility   Stenotrophomonas maltophilia - MIC*    LEVOFLOXACIN 0.5 SENSITIVE Sensitive     TRIMETH/SULFA <=20 SENSITIVE Sensitive     * STENOTROPHOMONAS MALTOPHILIA  Blood Culture ID Panel (Reflexed)     Status: None   Collection Time: 04/17/18  1:05 PM  Result Value Ref Range Status   Enterococcus species NOT DETECTED NOT DETECTED Final   Listeria monocytogenes NOT DETECTED NOT DETECTED Final   Staphylococcus species NOT DETECTED NOT DETECTED Final   Staphylococcus aureus (BCID) NOT DETECTED NOT DETECTED Final   Streptococcus species NOT DETECTED NOT DETECTED Final   Streptococcus agalactiae NOT DETECTED NOT DETECTED Final   Streptococcus pneumoniae NOT DETECTED NOT DETECTED Final   Streptococcus pyogenes NOT DETECTED NOT DETECTED Final   Acinetobacter baumannii NOT DETECTED NOT DETECTED Final   Enterobacteriaceae species NOT DETECTED NOT DETECTED Final   Enterobacter cloacae complex NOT DETECTED NOT DETECTED Final   Escherichia coli NOT DETECTED NOT DETECTED Final   Klebsiella oxytoca NOT DETECTED NOT DETECTED Final   Klebsiella pneumoniae NOT DETECTED NOT DETECTED Final   Proteus species NOT DETECTED NOT DETECTED Final   Serratia marcescens NOT DETECTED NOT DETECTED Final   Haemophilus influenzae NOT DETECTED NOT DETECTED Final    Neisseria meningitidis NOT DETECTED NOT DETECTED Final   Pseudomonas aeruginosa NOT DETECTED NOT DETECTED Final   Candida albicans NOT DETECTED NOT DETECTED Final   Candida glabrata NOT DETECTED NOT DETECTED Final   Candida krusei NOT DETECTED NOT DETECTED Final   Candida parapsilosis NOT DETECTED NOT DETECTED Final   Candida tropicalis NOT DETECTED NOT DETECTED Final    Comment: Performed at [facility], [address] [place], [place] [address]  Blood Culture ID Panel (Reflexed)     Status: None   Collection Time: 04/17/18  3:04 PM  Result Value Ref Range Status   Enterococcus species NOT DETECTED NOT DETECTED Final   Listeria monocytogenes NOT DETECTED NOT DETECTED Final   Staphylococcus species NOT DETECTED NOT DETECTED Final   Staphylococcus aureus (BCID) NOT DETECTED NOT  DETECTED Final   Streptococcus species NOT DETECTED NOT DETECTED Final   Streptococcus agalactiae NOT DETECTED NOT DETECTED Final   Streptococcus pneumoniae NOT DETECTED NOT DETECTED Final   Streptococcus pyogenes NOT DETECTED NOT DETECTED Final   Acinetobacter baumannii NOT DETECTED NOT DETECTED Final   Enterobacteriaceae species NOT DETECTED NOT DETECTED Final   Enterobacter cloacae complex NOT DETECTED NOT DETECTED Final   Escherichia coli NOT DETECTED NOT DETECTED Final   Klebsiella oxytoca NOT DETECTED NOT DETECTED Final   Klebsiella pneumoniae NOT DETECTED NOT DETECTED Final   Proteus species NOT DETECTED NOT DETECTED Final   Serratia marcescens NOT DETECTED NOT DETECTED Final   Haemophilus influenzae NOT DETECTED NOT DETECTED Final   Neisseria meningitidis NOT DETECTED NOT DETECTED Final   Pseudomonas aeruginosa NOT DETECTED NOT DETECTED Final   Candida albicans NOT DETECTED NOT DETECTED Final   Candida glabrata NOT DETECTED NOT DETECTED Final   Candida krusei NOT DETECTED NOT DETECTED Final   Candida parapsilosis NOT DETECTED NOT DETECTED Final   Candida tropicalis NOT DETECTED NOT DETECTED  Final    Comment: Performed at [facility], [address] [place], [place] [address]  CULTURE, BLOOD (ROUTINE X 2) w Reflex to ID Panel     Status: None   Collection Time: 04/19/18 12:44 AM  Result Value Ref Range Status   Specimen Description BLOOD BLOOD LEFT HAND  Final   Special Requests   Final    BOTTLES DRAWN AEROBIC AND ANAEROBIC Blood Culture adequate volume   Culture   Final    NO GROWTH 5 DAYS Performed at [facility], [address] [place], [place] [address]    Report Status 04/24/2018 FINAL  Final  CULTURE, BLOOD (ROUTINE X 2) w Reflex to ID Panel     Status: None   Collection Time: 04/19/18 12:46 AM  Result Value Ref Range Status   Specimen Description BLOOD BLOOD RIGHT HAND  Final   Special Requests   Final    BOTTLES DRAWN AEROBIC AND ANAEROBIC Blood Culture adequate volume   Culture   Final    NO GROWTH 5 DAYS Performed at [facility], [address], [place],  [address]    Report Status 04/24/2018 FINAL  Final     Studies: No results found.  Scheduled Meds: . docusate sodium  100 mg Oral BID  . feeding supplement (ENSURE ENLIVE)  237 mL Oral BID BM  . multivitamin with minerals  1 tablet Oral Daily  . vitamin C  250 mg Oral BID   Continuous Infusions: . anidulafungin Stopped (04/26/18 2307)  . levofloxacin (LEVAQUIN) IV Stopped (04/26/18 1243)  . vancomycin Stopped (04/27/18 0602)    Assessment/Plan:  1. 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  TEE shows small endocarditis-continue antibiotics as per ID , after 2 weeks ID may consider oral treatment 2. liver mass consistent with hepatocellular cancer oncology has seen the patient outpatient follow-up once current clinical situation improved 3. 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   4. Back pain.   MRI of the thoracic spine secondary to back pain.  Shows discitis treatment as per #1 5. Anemia of chronic disease.  Status post 1 unit of packed RBCs follow cbc hemoglobin stable 6. patient must stop IV drug use prognosis is now very poor in light of hepatocellular cancer Appreciate ID following Code Status:  Code Status Orders  (From admission, onward)         Start     Ordered   04/17/18 1446  Full code  Continuous     04/17/18 1446        Code Status History    This patient has a current code status but no historical code status.      Disposition Plan: pending antibiotic treatment and final recommendation per id  Antibiotics:  Anidalfungin  Vancomycin  Levaquin  Time spent: 28 minutes, [place]

## 2018-04-28 DIAGNOSIS — E440 Moderate protein-calorie malnutrition: Secondary | ICD-10-CM

## 2018-04-28 LAB — ANTIFUNGAL SUSCEP, FLUCONAZOLE: Fluconazole Islt MIC: 0.5

## 2018-04-28 NOTE — Progress Notes (Addendum)
Patient ID: Andrew Gibbs, male   DOB: [birth year], 62 y.o.   MRN: [id number]  [employer] [facility] PROGRESS NOTE  Andrew Gibbs:[id number] DOB: 1955-08-18 DOA: 04/17/2018 PCP: Patient, No Pcp Per  HPI/Subjective: Patient denies any complaints has some low-grade temperature Objective: Vitals:   04/27/18 2019 04/28/18 0438  BP: (!) 158/76 (!) 152/66  Pulse: 76 64  Resp: 19 16  Temp: 100 F (37.8 C) 99.5 F (37.5 C)  SpO2: 100% 100%    Filed Weights   04/25/18 0528 04/26/18 0508 04/27/18 0511  Weight: 64 kg 65.1 kg 62.5 kg    ROS: Review of Systems  Constitutional: Negative for chills and fever.  Eyes: Negative for blurred vision.  Respiratory: Negative for cough and shortness of breath.   Cardiovascular: Negative for chest pain.  Gastrointestinal: Negative for abdominal pain, constipation, diarrhea, nausea and vomiting.  Genitourinary: Negative for dysuria.  Musculoskeletal: Negative for back pain, joint pain and myalgias.  Neurological: Negative for dizziness and headaches.   Exam: Physical Exam  HENT:  Nose: No mucosal edema.  Mouth/Throat: No oropharyngeal exudate or posterior oropharyngeal edema.  Eyes: Pupils are equal, round, and reactive to light. Conjunctivae, EOM and lids are normal.  Neck: No JVD present. Carotid bruit is not present. No edema present. No thyroid mass and no thyromegaly present.  Cardiovascular: S1 normal and S2 normal. Exam reveals no gallop.  No murmur heard. Pulses:      Dorsalis pedis pulses are 2+ on the right side, and 2+ on the left side.  Respiratory: No respiratory distress. He has no wheezes. He has no rhonchi. He has no rales.  GI: Soft. Bowel sounds are normal. He exhibits distension. There is no tenderness.  Musculoskeletal:       Right elbow: He exhibits swelling.       Left elbow: He exhibits swelling.       Right ankle: He exhibits no swelling.       Left ankle: He exhibits no swelling.  Lymphadenopathy:    He has no  cervical adenopathy.  Neurological: He is alert. No cranial nerve deficit.  Skin: Skin is warm. Nails show no clubbing.  Numerous areas of small little furuncles on bilateral arms.  On the right arm he does have an open tract.  I do not see any specific abscess.  Psychiatric: He has a normal mood and affect.      Data Reviewed: Basic Metabolic Panel: Recent Labs  Lab 04/24/18 0515 04/26/18 1434  NA 135  --   K 3.9  --   CL 108  --   CO2 23  --   GLUCOSE 92  --   BUN <5*  --   CREATININE 0.50* 0.66  CALCIUM 7.8*  --    Liver Function Tests: No results for input(s): AST, ALT, ALKPHOS, BILITOT, PROT, ALBUMIN in the last 168 hours. No results for input(s): LIPASE, AMYLASE in the last 168 hours. CBC: Recent Labs  Lab 04/24/18 0515  WBC 4.1  HGB 10.8*  HCT 32.2*  MCV 99.1  PLT 52*   Cardiac Enzymes: No results for input(s): CKTOTAL, CKMB, CKMBINDEX, TROPONINI in the last 168 hours.   Recent Results (from the past 240 hour(s))  CULTURE, BLOOD (ROUTINE X 2) w Reflex to ID Panel     Status: None   Collection Time: 04/19/18 12:44 AM  Result Value Ref Range Status   Specimen Description BLOOD BLOOD LEFT HAND  Final   Special Requests   Final  BOTTLES DRAWN AEROBIC AND ANAEROBIC Blood Culture adequate volume   Culture   Final    NO GROWTH 5 DAYS Performed at [facility], [address] [place], [place] [address]    Report Status 04/24/2018 FINAL  Final  CULTURE, BLOOD (ROUTINE X 2) w Reflex to ID Panel     Status: None   Collection Time: 04/19/18 12:46 AM  Result Value Ref Range Status   Specimen Description BLOOD BLOOD RIGHT HAND  Final   Special Requests   Final    BOTTLES DRAWN AEROBIC AND ANAEROBIC Blood Culture adequate volume   Culture   Final    NO GROWTH 5 DAYS Performed at [facility], [address] [place], [place] [address]    Report Status 04/24/2018 FINAL  Final  CULTURE, BLOOD (ROUTINE X 2) w Reflex to ID Panel     Status:  None (Preliminary result)   Collection Time: 04/27/18  3:51 PM  Result Value Ref Range Status   Specimen Description BLOOD BLOOD LEFT HAND  Final   Special Requests   Final    BOTTLES DRAWN AEROBIC AND ANAEROBIC Blood Culture results may not be optimal due to an inadequate volume of blood received in culture bottles   Culture   Final    NO GROWTH < 24 HOURS Performed at [facility], [address] [place], [place] [address]    Report Status PENDING  Incomplete  CULTURE, BLOOD (ROUTINE X 2) w Reflex to ID Panel     Status: None (Preliminary result)   Collection Time: 04/27/18  3:51 PM  Result Value Ref Range Status   Specimen Description BLOOD BLOOD RIGHT HAND  Final   Special Requests   Final    BOTTLES DRAWN AEROBIC AND ANAEROBIC Blood Culture results may not be optimal due to an inadequate volume of blood received in culture bottles   Culture   Final    NO GROWTH < 24 HOURS Performed at [facility], [place], [address] [place], [place] [address]    Report Status PENDING  Incomplete     Studies: No results found.  Scheduled Meds: . docusate sodium  100 mg Oral BID  . feeding supplement (ENSURE ENLIVE)  237 mL Oral BID BM  . multivitamin with minerals  1 tablet Oral Daily  . vitamin C  250 mg Oral BID   Continuous Infusions: . sodium chloride Stopped (04/27/18 1607)  . anidulafungin Stopped (04/27/18 2055)  . levofloxacin (LEVAQUIN) IV Stopped (04/27/18 1427)  . vancomycin Stopped (04/28/18 0315)    Assessment/Plan:  1. 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  TEE shows small endocarditis-continue antibiotics as per ID , after 2 weeks ID may consider oral treatment, appreciate ID follow-up tomorrow for.  Has low-grade temperature, repeat blood cultures from 10/12 are negative so far. 2. liver mass consistent with hepatocellular cancer  oncology has seen the patient outpatient follow-up once current clinical situation improved 3. 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   4. Back pain.  MRI of the thoracic spine secondary to back pain.  Shows discitis treatment as per #1 5. Anemia of chronic disease.  Status post 1 unit of packed RBCs follow cbc hemoglobin stable 6. patient must stop IV drug use prognosis is now very poor in light of hepatocellular cancer Appreciate ID following Code Status:     Code Status Orders  (  From admission, onward)         Start     Ordered   04/17/18 1446  Full code  Continuous     04/17/18 1446        Code Status History    This patient has a current code status but no historical code status.      Disposition Plan: pending antibiotic treatment and final recommendation per id  Antibiotics:  Anidalfungin  Vancomycin  Levaquin  Time spent: 28 minutes, [place]  More than 50% time spent in counseling and coordination of care

## 2018-04-29 NOTE — Progress Notes (Signed)
Pharmacy Antibiotic Note  Andrew Gibbs is a 62 y.o. male admitted on 04/17/2018 with sepsis.  Pharmacy has been consulted for  vancomycin dosing.  Plan: 10/11 14:34 vancomycin trough returned 11 mcg/ml. Continue current regimen of vancomycin 1 gm IV Q12H. Vancomycin is scheduled to end 10/14 per ID. Pharmacy will continue to monitor SCr as available but will not check another level unless therapy is extended past 10/14.    Height: 5\' 7"  (170.2 cm) Weight: 131 lb 13.4 oz (59.8 kg) IBW/kg (Calculated) : 66.1  Temp (24hrs), Avg:100.1 F (37.8 C), Min:98.6 F (37 C), Max:102.8 F (39.3 C)  Recent Labs  Lab 04/24/18 0515 04/26/18 1434  WBC 4.1  --   CREATININE 0.50* 0.66  VANCOTROUGH 21* 11*    Estimated Creatinine Clearance: 81 mL/min (by C-G formula based on SCr of 0.66 mg/dL).    No Known Allergies  Antimicrobials this admission:   >>   >>   Dose adjustments this admission:  Microbiology results:  BCx:  UCx:    Sputum:   MRSA PCR:   Thank you for allowing pharmacy to be a part of this patient's care.  Forrest Moron, PharmD Clinical Pharmacist 04/29/2018 10:03 AM

## 2018-04-29 NOTE — Progress Notes (Signed)
Patient ID: Andrew Gibbs, male   DOB: 1956/05/23, 62 y.o.   MRN: [id number]  [facility] PROGRESS NOTE  OZZIE KNOBEL EPP:[id number] DOB: [birth year] DOA: 04/17/2018 PCP: Patient, No Pcp Per  HPI/Subjective: noFurther complaints.  Complains of occasional back pain. Objective: Vitals:   04/29/18 0418 04/29/18 1210  BP: (!) 150/60 (!) 136/56  Pulse: 67 84  Resp: 19 20  Temp: 98.9 F (37.2 C) 99.8 F (37.7 C)  SpO2: 98% 96%    Filed Weights   04/26/18 0508 04/27/18 0511 04/29/18 0418  Weight: 65.1 kg 62.5 kg 59.8 kg    ROS: Review of Systems  Constitutional: Negative for chills and fever.  Eyes: Negative for blurred vision.  Respiratory: Negative for cough and shortness of breath.   Cardiovascular: Negative for chest pain.  Gastrointestinal: Negative for abdominal pain, constipation, diarrhea, nausea and vomiting.  Genitourinary: Negative for dysuria.  Musculoskeletal: Negative for back pain, joint pain and myalgias.  Neurological: Negative for dizziness and headaches.   Exam: Physical Exam  HENT:  Nose: No mucosal edema.  Mouth/Throat: No oropharyngeal exudate or posterior oropharyngeal edema.  Eyes: Pupils are equal, round, and reactive to light. Conjunctivae, EOM and lids are normal.  Neck: No JVD present. Carotid bruit is not present. No edema present. No thyroid mass and no thyromegaly present.  Cardiovascular: S1 normal and S2 normal. Exam reveals no gallop.  No murmur heard. Pulses:      Dorsalis pedis pulses are 2+ on the right side, and 2+ on the left side.  Respiratory: No respiratory distress. He has no wheezes. He has no rhonchi. He has no rales.  GI: Soft. Bowel sounds are normal. He exhibits distension. There is no tenderness.  Musculoskeletal:       Right elbow: He exhibits swelling.       Left elbow: He exhibits swelling.       Right ankle: He exhibits no swelling.       Left ankle: He exhibits no swelling.  Lymphadenopathy:    He has no  cervical adenopathy.  Neurological: He is alert. No cranial nerve deficit.  Skin: Skin is warm. Nails show no clubbing.  Numerous areas of small little furuncles on bilateral arms.  On the right arm he does have an open tract.  I do not see any specific abscess.  Psychiatric: He has a normal mood and affect.      Data Reviewed: Basic Metabolic Panel: Recent Labs  Lab 04/24/18 0515 04/26/18 1434  NA 135  --   K 3.9  --   CL 108  --   CO2 23  --   GLUCOSE 92  --   BUN <5*  --   CREATININE 0.50* 0.66  CALCIUM 7.8*  --    Liver Function Tests: No results for input(s): AST, ALT, ALKPHOS, BILITOT, PROT, ALBUMIN in the last 168 hours. No results for input(s): LIPASE, AMYLASE in the last 168 hours. CBC: Recent Labs  Lab 04/24/18 0515  WBC 4.1  HGB 10.8*  HCT 32.2*  MCV 99.1  PLT 52*   Cardiac Enzymes: No results for input(s): CKTOTAL, CKMB, CKMBINDEX, TROPONINI in the last 168 hours.   Recent Results (from the past 240 hour(s))  CULTURE, BLOOD (ROUTINE X 2) w Reflex to ID Panel     Status: None (Preliminary result)   Collection Time: 04/27/18  3:51 PM  Result Value Ref Range Status   Specimen Description BLOOD BLOOD LEFT HAND  Final   Special Requests  Final    BOTTLES DRAWN AEROBIC AND ANAEROBIC Blood Culture results may not be optimal due to an inadequate volume of blood received in culture bottles   Culture   Final    NO GROWTH 2 DAYS Performed at [facility], [address] [place], [place] [address]    Report Status PENDING  Incomplete  CULTURE, BLOOD (ROUTINE X 2) w Reflex to ID Panel     Status: None (Preliminary result)   Collection Time: 04/27/18  3:51 PM  Result Value Ref Range Status   Specimen Description BLOOD BLOOD RIGHT HAND  Final   Special Requests   Final    BOTTLES DRAWN AEROBIC AND ANAEROBIC Blood Culture results may not be optimal due to an inadequate volume of blood received in culture bottles   Culture   Final    NO GROWTH 2  DAYS Performed at [facility], [address], [place], [place] [address]    Report Status PENDING  Incomplete     Studies: No results found.  Scheduled Meds: . docusate sodium  100 mg Oral BID  . feeding supplement (ENSURE ENLIVE)  237 mL Oral BID BM  . multivitamin with minerals  1 tablet Oral Daily  . vitamin C  250 mg Oral BID   Continuous Infusions: . sodium chloride Stopped (04/27/18 1607)  . anidulafungin Stopped (04/28/18 2054)  . levofloxacin (LEVAQUIN) IV 100 mL/hr at 04/29/18 1228  . vancomycin 1,000 mg (04/29/18 1410)    Assessment/Plan:  1. 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  TEE shows small endocarditis-continue antibiotics as per ID , after 2 weeks ID may consider oral treatment, appreciate ID follow-up tomorrow for.  Has low-grade temperature, repeat blood cultures from 10/12 are negative so far.  Appreciate ID following today.  liver mass consistent with hepatocellular cancer oncology has seen the patient outpatient follow-up once current clinical situation improved 2. 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   3. Back pain.  MRI of the thoracic spine secondary to back pain.  Shows discitis treatment as per #1 4. Anemia of chronic disease.  Status post 1 unit of packed RBCs follow cbc hemoglobin stable 5. patient must stop IV drug use prognosis is now very poor in light of hepatocellular cancer Appreciate ID following Code Status:     Code Status Orders  (From admission, onward)         Start     Ordered   04/17/18 1446  Full code  Continuous     04/17/18 1446        Code Status History    This patient has a current code status but no historical code status.      Disposition Plan: pending antibiotic treatment and final recommendation per  id  Antibiotics:  Anidalfungin  Vancomycin  Levaquin  Time spent: 28 minutes, [place]  More than 50% time spent in counseling and coordination of care

## 2018-04-30 NOTE — Progress Notes (Addendum)
Nutrition Follow Up Note   DOCUMENTATION CODES:   Non-severe (moderate) malnutrition in context of chronic illness  INTERVENTION:   Recommend bowel regimen per MD   Ensure Enlive po BID, each supplement provides 350 kcal and 20 grams of protein  MVI daily  Vitamin C 250mg  po BID  Magic cup TID with meals, each supplement provides 290 kcal and 9 grams of protein  NUTRITION DIAGNOSIS:   Moderate Malnutrition related to chronic illness(Cirrhosis, IV drug abuse, probable hepatocellular cancer ) as evidenced by moderate fat depletion, moderate muscle depletion.  GOAL:   Patient will meet greater than or equal to 90% of their needs  -progressing   MONITOR:   PO intake, Supplement acceptance, Labs, Weight trends, Skin, I & O's  ASSESSMENT:   62 y.o. male with no significant past ocular history, has a complex medical history significant IV heroin drug abuse and hepatitis, now hospitalized for sepsis with blood cultures positive for yeast and bacteria, recently diagnosed with hepatocellular carcinoma.   Pt doing well; eating 100% of meals and drinking supplements. Per chart, pt down ~11lbs since admit and appears to be back down to his UBW. Recommend continue vitamins and supplements. No BM noted since 1-/12; recommend bowel regimen per MD.   Medications reviewed and include: colace, MVI, vitamin C  Labs reviewed: none recent  Diet Order:   Diet Order            Diet regular Room service appropriate? Yes; Fluid consistency: Thin  Diet effective now             EDUCATION NEEDS:   Education needs have been addressed  Skin:  Skin Assessment: Reviewed RN Assessment(abscess arm)  Last BM:  10/12- type 2  Height:   Ht Readings from Last 1 Encounters:  04/17/18 5\' 7"  (1.702 m)    Weight:   Wt Readings from Last 1 Encounters:  04/30/18 58.5 kg    Ideal Body Weight:  67 kg  BMI:  Body mass index is 20.2 kg/m.  Estimated Nutritional Needs:   Kcal:   1800-2100kcal/day   Protein:  83-96g/day   Fluid:  >1.8L/day   Koleen Distance MS, RD, LDN Pager #- [phone] Office#- [phone] After Hours Pager: [phone]

## 2018-04-30 NOTE — Progress Notes (Signed)
Patient ID: Andrew Gibbs, male   DOB: 03-07-1956, 62 y.o.   MRN: [id number]  [facility] PROGRESS NOTE  Andrew Gibbs NTI:[id number] DOB: 03-09-1956 DOA: 04/17/2018 PCP: Patient, No Pcp Per  HPI/Subjective: Patient denies any complaints except back pain which is controlled with pain medicine, repeat blood cultures have been negative for 5 days.  Afebrile.  Tolerating the diet.  Does have severe dry skin, dry excoriated areas on both arms.   objective: Vitals:   04/30/18 0513 04/30/18 1156  BP: (!) 109/52 (!) 118/50  Pulse: 63 70  Resp: 18 19  Temp: 98.5 F (36.9 C) 98.3 F (36.8 C)  SpO2: 100% 97%    Filed Weights   04/27/18 0511 04/29/18 0418 04/30/18 0513  Weight: 62.5 kg 59.8 kg 58.5 kg    ROS: Review of Systems  Constitutional: Negative for chills and fever.  Eyes: Negative for blurred vision.  Respiratory: Negative for cough and shortness of breath.   Cardiovascular: Negative for chest pain.  Gastrointestinal: Negative for abdominal pain, constipation, diarrhea, nausea and vomiting.  Genitourinary: Negative for dysuria.  Musculoskeletal: Negative for back pain, joint pain and myalgias.  Neurological: Negative for dizziness and headaches.   Exam: Physical Exam  HENT:  Nose: No mucosal edema.  Mouth/Throat: No oropharyngeal exudate or posterior oropharyngeal edema.  Eyes: Pupils are equal, round, and reactive to light. Conjunctivae, EOM and lids are normal.  Neck: No JVD present. Carotid bruit is not present. No edema present. No thyroid mass and no thyromegaly present.  Cardiovascular: S1 normal and S2 normal. Exam reveals no gallop.  No murmur heard. Pulses:      Dorsalis pedis pulses are 2+ on the right side, and 2+ on the left side.  Respiratory: No respiratory distress. He has no wheezes. He has no rhonchi. He has no rales.  GI: Soft. Bowel sounds are normal. He exhibits distension. There is no tenderness.  Musculoskeletal:       Right elbow: He  exhibits swelling.       Left elbow: He exhibits swelling.       Right ankle: He exhibits no swelling.       Left ankle: He exhibits no swelling.  Lymphadenopathy:    He has no cervical adenopathy.  Neurological: He is alert. No cranial nerve deficit.  Skin: Skin is warm. Nails show no clubbing.  Numerous areas of small little furuncles on bilateral arms.  On the right arm he does have an open tract.  I do not see any specific abscess.  Psychiatric: He has a normal mood and affect.      Data Reviewed: Basic Metabolic Panel: Recent Labs  Lab 04/24/18 0515 04/26/18 1434  NA 135  --   K 3.9  --   CL 108  --   CO2 23  --   GLUCOSE 92  --   BUN <5*  --   CREATININE 0.50* 0.66  CALCIUM 7.8*  --    Liver Function Tests: No results for input(s): AST, ALT, ALKPHOS, BILITOT, PROT, ALBUMIN in the last 168 hours. No results for input(s): LIPASE, AMYLASE in the last 168 hours. CBC: Recent Labs  Lab 04/24/18 0515  WBC 4.1  HGB 10.8*  HCT 32.2*  MCV 99.1  PLT 52*   Cardiac Enzymes: No results for input(s): CKTOTAL, CKMB, CKMBINDEX, TROPONINI in the last 168 hours.   Recent Results (from the past 240 hour(s))  CULTURE, BLOOD (ROUTINE X 2) w Reflex to ID Panel  Status: None (Preliminary result)   Collection Time: 04/27/18  3:51 PM  Result Value Ref Range Status   Specimen Description BLOOD BLOOD LEFT HAND  Final   Special Requests   Final    BOTTLES DRAWN AEROBIC AND ANAEROBIC Blood Culture results may not be optimal due to an inadequate volume of blood received in culture bottles   Culture   Final    NO GROWTH 3 DAYS Performed at [facility], [address], [place], [place] [address]    Report Status PENDING  Incomplete  CULTURE, BLOOD (ROUTINE X 2) w Reflex to ID Panel     Status: None (Preliminary result)   Collection Time: 04/27/18  3:51 PM  Result Value Ref Range Status   Specimen Description BLOOD BLOOD RIGHT HAND  Final   Special Requests   Final     BOTTLES DRAWN AEROBIC AND ANAEROBIC Blood Culture results may not be optimal due to an inadequate volume of blood received in culture bottles   Culture   Final    NO GROWTH 3 DAYS Performed at [facility], [address] [place],  [address]    Report Status PENDING  Incomplete     Studies: No results found.  Scheduled Meds: . docusate sodium  100 mg Oral BID  . feeding supplement (ENSURE ENLIVE)  237 mL Oral BID BM  . multivitamin with minerals  1 tablet Oral Daily  . vitamin C  250 mg Oral BID   Continuous Infusions: . sodium chloride 250 mL (04/30/18 1127)  . anidulafungin Stopped (04/29/18 1948)  . levofloxacin (LEVAQUIN) IV 750 mg (04/30/18 1129)    Assessment/Plan:  1. 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  TEE shows small endocarditis-continue antibiotics as per ID , after 2 weeks ID may consider oral treatment,  2. Appreciate ID consult, spoke with Dr. Steva Ready yesterday she will see the patient today 3.   liver mass consistent with hepatocellular cancer oncology has seen the patient outpatient follow-up once current clinical situation improved, patient knows about his diagnosis. 4. 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  Patient also has paraesophageal varices with portal hypertension.  Will add small dose beta-blockers 5. Back pain.  MRI of the thoracic spine secondary to back pain.  Shows discitis treatment as per #1, afebrile, WBC normal, continue IV antibiotics at this time, appreciate ID follow-up on this recommendation for further antibiotics. 6. Anemia of chronic disease.  Status post 1 unit of packed RBCs follow cbc hemoglobin stable 7. patient must stop IV drug use prognosis is now very poor in light of hepatocellular cancer Appreciate ID following #8 nonsevere malnutrition in the  context of chronic illness, seen by dietitian, continue nutritional supplements, multivitamins. Code Status:     Code Status Orders  (From admission, onward)         Start     Ordered   04/17/18 1446  Full code  Continuous     04/17/18 1446        Code Status History    This patient has a current code status but no historical code status.    More than 50% time spent in counseling, coordination of care  Disposition Plan: pending antibiotic treatment and final recommendation per id  Antibiotics:  Anidalfungin  Vancomycin  Levaquin  Time spent: 40 minutes, [place]  More than 50% time  spent in counseling and coordination of care

## 2018-05-01 LAB — CBC WITH DIFFERENTIAL/PLATELET
Abs Immature Granulocytes: 0.03 10*3/uL (ref 0.00–0.07)
Basophils Absolute: 0 10*3/uL (ref 0.0–0.1)
Basophils Relative: 0 %
EOS PCT: 7 %
Eosinophils Absolute: 0.3 10*3/uL (ref 0.0–0.5)
HCT: 29.2 % — ABNORMAL LOW (ref 39.0–52.0)
HEMOGLOBIN: 9.9 g/dL — AB (ref 13.0–17.0)
Immature Granulocytes: 1 %
LYMPHS ABS: 1.8 10*3/uL (ref 0.7–4.0)
Lymphocytes Relative: 38 %
MCH: 33 pg (ref 26.0–34.0)
MCHC: 33.9 g/dL (ref 30.0–36.0)
MCV: 97.3 fL (ref 80.0–100.0)
MONO ABS: 0.3 10*3/uL (ref 0.1–1.0)
Monocytes Relative: 7 %
NEUTROS ABS: 2.2 10*3/uL (ref 1.7–7.7)
Neutrophils Relative %: 47 %
Platelets: 43 10*3/uL — ABNORMAL LOW (ref 150–400)
RBC: 3 MIL/uL — AB (ref 4.22–5.81)
RDW: 18.6 % — ABNORMAL HIGH (ref 11.5–15.5)
Smear Review: NORMAL
WBC: 4.6 10*3/uL (ref 4.0–10.5)
nRBC: 0 % (ref 0.0–0.2)

## 2018-05-01 LAB — COMPREHENSIVE METABOLIC PANEL
ALK PHOS: 138 U/L — AB (ref 38–126)
ALT: 12 U/L (ref 0–44)
ANION GAP: 3 — AB (ref 5–15)
AST: 48 U/L — AB (ref 15–41)
Albumin: 1.8 g/dL — ABNORMAL LOW (ref 3.5–5.0)
BUN: 12 mg/dL (ref 8–23)
CALCIUM: 8 mg/dL — AB (ref 8.9–10.3)
CO2: 24 mmol/L (ref 22–32)
Chloride: 109 mmol/L (ref 98–111)
Creatinine, Ser: 0.48 mg/dL — ABNORMAL LOW (ref 0.61–1.24)
GFR calc Af Amer: >60 mL/min (ref >60)
GFR calc non Af Amer: >60 mL/min (ref >60)
Glucose, Bld: 94 mg/dL (ref 70–99)
POTASSIUM: 3.7 mmol/L (ref 3.5–5.1)
Sodium: 136 mmol/L (ref 135–145)
Total Bilirubin: 1.4 mg/dL — ABNORMAL HIGH (ref 0.3–1.2)
Total Protein: 7.4 g/dL (ref 6.5–8.1)

## 2018-05-01 LAB — SEDIMENTATION RATE: Sed Rate: 83 mm/hr — ABNORMAL HIGH (ref 0–20)

## 2018-05-01 MED ORDER — FLUCONAZOLE 100 MG PO TABS
600.0000 mg | ORAL_TABLET | Freq: Every day | ORAL | Status: DC
  Administered 2018-05-02: 600 mg via ORAL
  Filled 2018-05-01: qty 6

## 2018-05-01 NOTE — Progress Notes (Signed)
Nursing staff ambulated patient as he has not been active since working with PT. Previous PT workup stated that patient ambulated around 350 feet with minimal assistance. Patient currently was only able to ambulate 120 feet and was significantly unsteady with his balance and has to use IV for stability and would have possibly fallen had he not had nursing to hold him up via the gate belt. Patient lives alone and feels his gait had improved but has now worsened again. It was recommended by PT that nursing staff keep him active but per the chart the patient has not ambulated out of the room much which is likely the cause of his decrease in ability. Nursing staff will continue to ambulate patient multiple times which will likely improve his balance and stamina. Per MD we will keep PT consult for an evaluation since his gait and balance seemed to have significantly declined since last evaluation although it is a strong possibility he will regain independence with the appropriate amount of activity via staff.

## 2018-05-01 NOTE — Progress Notes (Addendum)
? Andrew Gibbs is a 62 y.o. with a history  of hepatitis C and daily IV heroin use  admitted with fever and chills and also pain and swelling of the right upper arm.  Patient has Candida  fungemia, stenotrophomonas in the blood culture, and Serratia in wound culture  Subjective States he is feeling MUCH better Back pain much improved  Objective:  VITALS:  BP (!) 158/59   Pulse 75   Temp 98 F (36.7 C) (Oral)   Resp 16   Ht '5\' 7"'  (1.702 m)   Wt 58.5 kg   SpO2 99%   BMI 20.20 kg/m  PHYSICAL EXAM:  General: Awake and alert and in no distress  eyes: Conjunctivae clear, anicteric sclerae. Pupils are constricted, arcus senilis ENT Nares normal. No drainage or sinus tenderness. Lips, mucosa, and tongue normal. No Thrush, poor dentition Neck: Supple, symmetrical, no adenopathy, thyroid: non tender no carotid bruit and no JVD. Back: No CVA tenderness. Lungs: Bilateral air entry, few crackles in the bases Heart: Regular rate and rhythm, no murmur, rub or gallop. Abdomen: Soft, non-tender, mild distention. Bowel sounds normal. No masses Extremities: Multiple pop marks on the upper arms.  Induration and hardening of both arms chronic edema is better Skin: As above lymph: Cervical, supraclavicular normal. Neurologic: Grossly non-focal Pertinent Labs CBC Latest Ref Rng & Units 05/01/2018 04/24/2018 04/21/2018  WBC 4.0 - 10.5 K/uL 4.6 4.1 4.5  Hemoglobin 13.0 - 17.0 g/dL 9.9(L) 10.8(L) 10.3(L)  Hematocrit 39.0 - 52.0 % 29.2(L) 32.2(L) 30.5(L)  Platelets 150 - 400 K/uL 43(L) 52(L) 48(L)   CMP Latest Ref Rng & Units 05/01/2018 04/26/2018 04/24/2018  Glucose 70 - 99 mg/dL 94 - 92  BUN 8 - 23 mg/dL 12 - <5(L)  Creatinine 0.61 - 1.24 mg/dL 0.48(L) 0.66 0.50(L)  Sodium 135 - 145 mmol/L 136 - 135  Potassium 3.5 - 5.1 mmol/L 3.7 - 3.9  Chloride 98 - 111 mmol/L 109 - 108  CO2 22 - 32 mmol/L 24 - 23  Calcium 8.9 - 10.3 mg/dL 8.0(L) - 7.8(L)  Total Protein 6.5 - 8.1 g/dL 7.4 - -  Total  Bilirubin 0.3 - 1.2 mg/dL 1.4(H) - -  Alkaline Phos 38 - 126 U/L 138(H) - -  AST 15 - 41 U/L 48(H) - -  ALT 0 - 44 U/L 12 - -    BC- 04/17/18 candida Lusitania, stenotrophomonas and flavobacterium 04/19/18 BC NG 04/27/18 BC NG  IMAGING RESULTS: Personally reviewed cervical spine imaging   Impression/Recommendation ? 62 y.o. with a history of history of hepatitis C and daily IV heroin use is admitted with fever and chills and also pain and swelling of the right upper arm. Pt says he last did heroin on the day he came to the ED. He was visiting a friend in group home and felt chills and shakes and his friend called 911 and he came to the ED. He skin pops heroin as he is unable to get a vein in the past many months.  He states he also has mid back pain and if the pain gets worse he always takes some heroin to ease the pain.  He had come to the emergency department in July with left-sided back and chest pain and had MRI of the thoracic spine with and without contrast which did not show any discitis.  He also had cervical spine MRI which which also did not reveal any infection.  In the ED he had a temperature of 100.2  blood pressure of 102/50 heart rate of 16.  Blood cultures were sent and he was started on cefepime and vancomycin.  I am asked to see the patient has his blood culture is positive for yeast.  ? [name] and polymicrobial bacteremia secondary to IV drug use and skin popping.[name]  Has Candida, stenotrophomonas, Flavobacterium in blood culture.  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 Prescription to be given for 4 weeks with 1 refill and he will follow up with me as OP  stenotrophomonas bacteremia -possible this could be causing  endocarditis  and discitis as well. On levaquin currently- will need until 05/30/18. Flavobacterium in blood culture-  asked susceptible to quinolone The other option is bactrim. 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  In July the MRI of the spine was normal.  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 There was a small,  irregular, solid, mobile vegetation on the left ventricular  aspect of the left coronary cusp; the appearance is consistent   with vegetation) For candida endocarditis surgery is recommended, In his case as it small vegetation on the aortic cusp it would be prudent to treat him with antibiotics and then repeat echo to look at valve function  Anemia: In 2016 he had normal hemoglobin and on this admission it was 6.3 and had to have blood transfusion.  It is  macrocytic.  With underlying hepatitis C need to rule out cirrhosis with the GI bleed or malignancy.  Also need to check B12 and folate  Hepatitis C has not been treated. Has liver cancer with high AFP  HIV was negative in July 2019  Discussed the management with patient and his sister and hospitalist Explained the side effects of levaquin and flucoanzole and adherence to meds While on these antibiotics check CBC/CMP/ESR  every week as OP Follow up with me as OP in 2-3 weeks

## 2018-05-01 NOTE — Plan of Care (Signed)
  Problem: Education: Goal: Knowledge of General Education information will improve Description Including pain rating scale, medication(s)/side effects and non-pharmacologic comfort measures Outcome: Progressing   Problem: Health Behavior/Discharge Planning: Goal: Ability to manage health-related needs will improve Outcome: Progressing   Problem: Clinical Measurements: Goal: Ability to maintain clinical measurements within normal limits will improve Outcome: Progressing Goal: Will remain free from infection Outcome: Progressing Goal: Diagnostic test results will improve Outcome: Progressing Goal: Respiratory complications will improve Outcome: Progressing Goal: Cardiovascular complication will be avoided Outcome: Progressing   Problem: Activity: Goal: Risk for activity intolerance will decrease Outcome: Progressing  PT pending

## 2018-05-01 NOTE — Progress Notes (Signed)
Patient ID: Andrew Gibbs, male   DOB: 1955/08/13, 62 y.o.   MRN: [id number]  [facility] PROGRESS NOTE  Andrew Gibbs GQQ:[id number] DOB: Apr 25, 1956 DOA: 04/17/2018 PCP: Patient, No Pcp Per  HPI/Subjective:  no further fever, complains of back pain.  Spoke with patient's brother at bedside today. objective: Vitals:   05/01/18 0553 05/01/18 1129  BP: 134/67 (!) 158/59  Pulse: 68 75  Resp: 20 16  Temp: 98.4 F (36.9 C) 98 F (36.7 C)  SpO2: 94% 99%    Filed Weights   04/27/18 0511 04/29/18 0418 04/30/18 0513  Weight: 62.5 kg 59.8 kg 58.5 kg    ROS: Review of Systems  Constitutional: Negative for chills and fever.  Eyes: Negative for blurred vision.  Respiratory: Negative for cough and shortness of breath.   Cardiovascular: Negative for chest pain.  Gastrointestinal: Negative for abdominal pain, constipation, diarrhea, nausea and vomiting.  Genitourinary: Negative for dysuria.  Musculoskeletal: Negative for back pain, joint pain and myalgias.  Neurological: Negative for dizziness and headaches.   Exam: Physical Exam  HENT:  Nose: No mucosal edema.  Mouth/Throat: No oropharyngeal exudate or posterior oropharyngeal edema.  Eyes: Pupils are equal, round, and reactive to light. Conjunctivae, EOM and lids are normal.  Neck: No JVD present. Carotid bruit is not present. No edema present. No thyroid mass and no thyromegaly present.  Cardiovascular: S1 normal and S2 normal. Exam reveals no gallop.  No murmur heard. Pulses:      Dorsalis pedis pulses are 2+ on the right side, and 2+ on the left side.  Respiratory: No respiratory distress. He has no wheezes. He has no rhonchi. He has no rales.  GI: Soft. Bowel sounds are normal. He exhibits distension. There is no tenderness.  Musculoskeletal:       Right elbow: He exhibits swelling.       Left elbow: He exhibits swelling.       Right ankle: He exhibits no swelling.       Left ankle: He exhibits no swelling.   Lymphadenopathy:    He has no cervical adenopathy.  Neurological: He is alert. No cranial nerve deficit.  Skin: Skin is warm. Nails show no clubbing.  Numerous areas of small little furuncles on bilateral arms.  On the right arm he does have an open tract.  I do not see any specific abscess.  Psychiatric: He has a normal mood and affect.      Data Reviewed: Basic Metabolic Panel: Recent Labs  Lab 04/26/18 1434 05/01/18 0608  NA  --  136  K  --  3.7  CL  --  109  CO2  --  24  GLUCOSE  --  94  BUN  --  12  CREATININE 0.66 0.48*  CALCIUM  --  8.0*   Liver Function Tests: Recent Labs  Lab 05/01/18 0608  AST 48*  ALT 12  ALKPHOS 138*  BILITOT 1.4*  PROT 7.4  ALBUMIN 1.8*   No results for input(s): LIPASE, AMYLASE in the last 168 hours. CBC: Recent Labs  Lab 05/01/18 0608  WBC 4.6  NEUTROABS 2.2  HGB 9.9*  HCT 29.2*  MCV 97.3  PLT 43*   Cardiac Enzymes: No results for input(s): CKTOTAL, CKMB, CKMBINDEX, TROPONINI in the last 168 hours.   Recent Results (from the past 240 hour(s))  CULTURE, BLOOD (ROUTINE X 2) w Reflex to ID Panel     Status: None (Preliminary result)   Collection Time: 04/27/18  3:51  PM  Result Value Ref Range Status   Specimen Description BLOOD BLOOD LEFT HAND  Final   Special Requests   Final    BOTTLES DRAWN AEROBIC AND ANAEROBIC Blood Culture results may not be optimal due to an inadequate volume of blood received in culture bottles   Culture   Final    NO GROWTH 4 DAYS Performed at [facility], [address], [place], [place] [address]    Report Status PENDING  Incomplete  CULTURE, BLOOD (ROUTINE X 2) w Reflex to ID Panel     Status: None (Preliminary result)   Collection Time: 04/27/18  3:51 PM  Result Value Ref Range Status   Specimen Description BLOOD BLOOD RIGHT HAND  Final   Special Requests   Final    BOTTLES DRAWN AEROBIC AND ANAEROBIC Blood Culture results may not be optimal due to an inadequate volume of blood  received in culture bottles   Culture   Final    NO GROWTH 4 DAYS Performed at [facility], [address] [place],  [address]    Report Status PENDING  Incomplete     Studies: No results found.  Scheduled Meds: . docusate sodium  100 mg Oral BID  . feeding supplement (ENSURE ENLIVE)  237 mL Oral BID BM  . multivitamin with minerals  1 tablet Oral Daily  . vitamin C  250 mg Oral BID   Continuous Infusions: . sodium chloride Stopped (04/30/18 1521)  . anidulafungin Stopped (04/30/18 1847)  . levofloxacin (LEVAQUIN) IV 750 mg (05/01/18 1343)    Assessment/Plan:  1. Sepsis due to yeast and stenotrohomonas maltifilia apreciate infectious disease consultation and continue IV Eraxis,, spoke with Dr. Steva Ready today, fluconazole sensitivities tested, patient can be on fluconazole for long time to help with endocarditis, discitis, needs Levaquin for at least 2 weeks.  Patient needs PT evaluation to see if he can go back home or needs to go to rehab.  Patient was living independently at his home but according to patient brother he is house isin court matter"" 2.  3. liver mass consistent with hepatocellular cancer oncology has seen the patient outpatient follow-up once current clinical situation improved, patient knows about his diagnosis.,  Follow-up with oncology as an outpatient.  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  Patient also has paraesophageal varices with portal hypertension.  Will add small dose beta-blockers 4. Back pain.  MRI of the thoracic spine secondary to back pain.  Shows discitis treatment as per #1, afebrile, WBC normal, likely discharge with [facility] for long time, Levaquin for 2 weeks  5. anemia of chronic disease.  Status post 1 unit of packed RBCs follow cbc hemoglobin stable 6. patient must stop IV drug use prognosis is now very poor in light of hepatocellular cancer Appreciate ID following #[id number]  nonsevere malnutrition in the context of chronic illness, seen by dietitian, continue nutritional supplements, multivitamins.  Discussed with patient's brother today. Ambulate, PT evaluation today in preparation for possible discharge in next 1 to 2 days. Code Status:     Code Status Orders  (From admission, onward)         Start     Ordered   04/17/18 1446  Full code  Continuous     04/17/18 1446        Code Status History    This patient has a current code status but no historical code status.    More than 50% time spent  in counseling, coordination of care  Disposition Plan: pending antibiotic treatment and final recommendation per id  Antibiotics:  Anidalfungin  Vancomycin  Levaquin  Time spent: 40 minutes, [place]  More than 50% time spent in counseling and coordination of care

## 2018-05-02 LAB — CULTURE, BLOOD (ROUTINE X 2)
CULTURE: NO GROWTH
Culture: NO GROWTH

## 2018-05-02 MED ORDER — ENSURE ENLIVE PO LIQD: 237.0000 mL | Freq: Two times a day (BID) | ORAL | 12 refills | Status: DC

## 2018-05-02 MED ORDER — OXYCODONE HCL 5 MG PO TABS: 5.0000 mg | ORAL_TABLET | ORAL | 0 refills | Status: DC | PRN

## 2018-05-02 MED ORDER — ASCORBIC ACID 250 MG PO TABS: 250.0000 mg | ORAL_TABLET | Freq: Two times a day (BID) | ORAL | 0 refills | Status: DC

## 2018-05-02 MED ORDER — FLUCONAZOLE 200 MG PO TABS: 600.0000 mg | ORAL_TABLET | Freq: Every day | ORAL | 2 refills | Status: DC

## 2018-05-02 MED ORDER — LEVOFLOXACIN 750 MG PO TABS: 750.0000 mg | ORAL_TABLET | Freq: Every day | ORAL | 0 refills | Status: DC

## 2018-05-02 MED ORDER — ADULT MULTIVITAMIN W/MINERALS CH: 1.0000 | ORAL_TABLET | Freq: Every day | ORAL | 0 refills | Status: AC

## 2018-05-02 NOTE — Care Management Note (Signed)
Case Management Note  Patient Details  Name: Andrew Gibbs MRN: [id number] Date of Birth: 09-07-1955   Patient admitted from home with sepsis.  Patient with history of skin popping heroin use.  Patient states that he lives in his home alone. Sister is at bedside.  States that she lives 2 houses down.  Patient states that he does not have a care of his own, but his 2 sisters help provide transportation when needed.  His sister Loletha Carrow who is at bedside confirms this.  Patient to transition to oral antibiotics prior to discharge.  MD is requesting home health to be arranged for patient.  Requested that SW be added to the order.  Patient agreeable to home health services.  Patient states that he does not have a preference of home health agency.   Due to the patients history [facility] was not able to accept the referral.  Referral was made to [facility] with [facility]. Due to staffing case will not be opened till Monday or Tuesday.  Patient notified.  New patient appointment at [facility] on 10/30.  Dr Vianne Bulls has agreed to sign the home health orders in the interim.   Subjective/Objective:                    Action/Plan:   Expected Discharge Date:  05/02/18               Expected Discharge Plan:  [place]  In-House Referral:     Discharge planning Services  CM Consult  Post Acute Care Choice:  Home Health Choice offered to:  Patient  DME Arranged:    DME Agency:     HH Arranged:  RN, PT, Nurse's Aide, Social Work [employer] Agency:  [facility] [employer]  Status of Service:  Completed, signed off  If discussed at [employer], dates discussed:    Additional Comments:  Beverly Sessions, RN 05/02/2018, 2:48 PM

## 2018-05-02 NOTE — Care Management Important Message (Signed)
Copy of signed IM left with patient in room.  

## 2018-05-02 NOTE — Clinical Social Work Note (Signed)
Clinical Social Work Assessment  Patient Details  Name: Andrew Gibbs MRN: [id number] Date of Birth: 1956-03-12  Date of referral:  05/02/18               Reason for consult:  Substance Use/ETOH Abuse                Permission sought to share information with:    Permission granted to share information::     Name::        Agency::     Relationship::     Contact Information:     Housing/Transportation Living arrangements for the past 2 months:  Single Family Home Source of Information:  Patient Patient Interpreter Needed:  None Criminal Activity/Legal Involvement Pertinent to Current Situation/Hospitalization:  No - Comment as needed Significant Relationships:  Siblings Lives with:  Self Do you feel safe going back to the place where you live?  Yes Need for family participation in patient care:  No (Coment)  Care giving concerns:  Patient is independent in ADL's.   Social Worker assessment / plan:  CSW informed that patient is discharging to home today on oral medication. CSW spoke with patient this morning about his heroin use and the fact that he has been 15 days now without it in his system. Patient states that he is "going to get himself straight." CSW asked if he wanted to have any outpatient rehab information and patient declined. CSW discussed the importance of him staying clean and patient stated that he knew he almost died. He stated that he had wanted to leave AMA earlier in his stay but the doctor explained to him if he did, he would die. Patient verbalizes the severity of the situation.   Employment status:  Disabled (Comment on whether or not currently receiving Disability) Insurance information:    PT Recommendations:    Information / Referral to community resources:     Patient/Family's Response to care:  Patient expressed appreciation for CSW visit.  Patient/Family's Understanding of and Emotional Response to Diagnosis, Current Treatment, and Prognosis:  Patient  verbalizing that he is going to take better care of himself.  Emotional Assessment Appearance:  Appears stated age Attitude/Demeanor/Rapport:  (pleasant and cooperative) Affect (typically observed):  Accepting, Calm Orientation:  Oriented to Self, Oriented to Place, Oriented to  Time, Oriented to Situation Alcohol / Substance use:  Illicit Drugs Psych involvement (Current and /or in the community):  No (Comment)  Discharge Needs  Concerns to be addressed:  Substance Abuse Concerns Readmission within the last 30 days:  No Current discharge risk:  None Barriers to Discharge:  No Barriers Identified   Shela Leff, LCSW 05/02/2018, 12:12 PM

## 2018-05-06 NOTE — Discharge Summary (Signed)
Andrew Gibbs, is a 62 y.o. male  DOB 01-Feb-1956  MRN [id number].  Admission date:  04/17/2018  Admitting Physician  Hillary Bow, MD  Discharge Date:  05/02/2018   Primary MD  Center, [facility]  Recommendations for primary care physician for things to follow:   Follow-up with PCP in 1 week   Admission Diagnosis  Abscess of right upper arm and forearm [L02.413] IV drug abuse ([place]) [F19.10] Chronic hepatitis C without hepatic coma (HCC) [B18.2] Sepsis, due to unspecified organism, unspecified whether acute organ dysfunction present [facility]) [A41.9]   Discharge Diagnosis  Abscess of right upper arm and forearm [L02.413] IV drug abuse ([place]) [F19.10] Chronic hepatitis C without hepatic coma ([place]) [B18.2] Sepsis, due to unspecified organism, unspecified whether acute organ dysfunction present ([place]) [A41.9]    Active Problems:   Abscess of right arm   Malnutrition of moderate degree      Past Medical History:  Diagnosis Date  . Hepatitis C   . Heroin use     Past Surgical History:  Procedure Laterality Date  . KNEE SURGERY    . TEE WITHOUT CARDIOVERSION N/A 04/22/2018   Procedure: TRANSESOPHAGEAL ECHOCARDIOGRAM (TEE);  Surgeon: Teodoro Spray, MD;  Location: [facility] ORS;  Service: Cardiovascular;  Laterality: N/A;       History of present illness and  Hospital Course:     Kindly see H&P for history of present illness and admission details, please review complete Labs, Consult reports and Test reports for all details in brief  HPI  from the history and physical done on the day of admission  62 year old male patient with history of anemia, thrombocytopenia,, low back pain, right upper extremity pain, admitted for right upper extremity abscess.  Patient has history of hepatitis C, IV drug  abuse. Hospital Course  #1. sepsis secondary to right upper extremity abscess: Seen by surgeon Dr. Rosana Hoes, did not recommend abscess drainage.  Patient had a lot of track marks and had heavy IV drug abuse: Received vancomycin, cefepime, blood cultures are positive for fungemia, sternomonas maltophilia. seen by infectious diseases, started on Eraxis, Levaquin .repeat blood cultures have been negative however patient complains of mid back pain, initial CT scan of the back were negative but repeat MRI showed osteomyelitis of C7, T1, patient had a TEE because of bacteremia and fungemia, TEE showed vegetations, after treatment with IV antifungals, Levaquin.  Blood cultures have been negative seen by Dr. Steva Ready, ID recommended indefinite treatment with fluconazole, 600 mg daily, Levaquin for 28 days.  Patient must stop using IV drugs.  Discussed with patient and also his sister.  Patient will be followed by Dr. Steva Ready regarding further use of  2.  Hepatitis C, thrombocytopenia, MRI of abdomen showed hepatocellular carcinoma, seen by Dr. Randa Evens from oncology, patient is given an appointment to follow-up with cancer center. 3.  Discharge home with home health nurse to do weekly labs and send it to Dr. Raelene Bott office.  #4 moderate malnutrition in the context of chronic illness: Seen by nutritionist, started on Ensure, vitamin C.   Discharge Condition: Stable   Follow UP  Follow-up Information    Tsosie Billing, MD. Go on 05/16/2018.   Specialty:  Infectious Diseases Why:  Thursday October 31st at [facility] for a follow-up  Contact information: [place] [address] [phone]        Sindy Guadeloupe, MD. Go on 05/16/2018.   Specialty:  Oncology Why:  Thursday October 31st  1:30pm for a follow-up appointment  Contact information: [place] [address] [phone]        Center, [facility].   Specialty:  General  Practice Why:  October 30th With [facility]. Arrive at [employer] information: [place] [address] [phone]             Discharge Instructions  and  Discharge Medications  , Stop IV drug abuse, should have weekly labs with CBC, ESR, BMP with home health nurse as per Dr. Steva Ready instructions Patient had no PCP but we got an appointment on October 30 with Dr. Ranae Plumber.   Allergies as of 05/02/2018   No Known Allergies     Medication List    STOP taking these medications   ibuprofen 200 MG tablet Commonly known as:  ADVIL,MOTRIN     TAKE these medications   ascorbic acid 250 MG tablet Commonly known as:  VITAMIN C Take 1 tablet (250 mg total) by mouth 2 (two) times daily.   feeding supplement (ENSURE ENLIVE) Liqd Take 237 mLs by mouth 2 (two) times daily between meals.   fluconazole 200 MG tablet Commonly known as:  DIFLUCAN Take 3 tablets (600 mg total) by mouth daily.   levofloxacin 750 MG tablet Commonly known as:  LEVAQUIN Take 1 tablet (750 mg total) by mouth daily for 28 days.   multivitamin with minerals Tabs tablet Take 1 tablet by mouth daily.   oxyCODONE 5 MG immediate release tablet Commonly known as:  Oxy IR/ROXICODONE Take 1 tablet (5 mg total) by mouth every 4 (four) hours as needed for moderate pain.   potassium chloride SA 20 MEQ tablet Commonly known as:  K-DUR,KLOR-CON Take 1 tablet (20 mEq total) by mouth daily.         Diet and Activity recommendation: See Discharge Instructions above   Consults obtained -infectious disease, cardiology, ophthalmology, surgery, nutritionist, physical therapy,.   Major procedures and Radiology Reports - PLEASE review detailed and final reports for all details, in brief -      Mr Thoracic Spine W Wo Contrast  Addendum Date: 04/20/2018   ADDENDUM REPORT: 04/20/2018 15:17 ADDENDUM: Study discussed by telephone with Dr. Posey Pronto on 04/20/2018 at at 1507 hours.  Electronically Signed   By: Genevie Ann M.D.   On: 04/20/2018 15:17   Result Date: 04/20/2018 CLINICAL DATA:  62 year old male with hepatitis C, IV drug use, back pain. Right upper extremity abscess. EXAM: MRI THORACIC WITHOUT AND WITH CONTRAST TECHNIQUE: Multiplanar and multiecho pulse sequences of the thoracic spine were obtained without and with intravenous contrast. CONTRAST:  6 milliliters Gadavist COMPARISON:  Cervical and thoracic spine MRI 02/14/2018. FINDINGS: Limited cervical spine imaging: Chronic degenerative endplate marrow signal changes throughout the cervical spine, but abnormal decreased T1 signal in the C7 and T1 vertebrae. Thoracic spine segmentation: Appears to be normal, the same numbering is used as on the 02/14/2018 comparison. Alignment:  Preserved thoracic vertebral alignment, kyphosis. Vertebrae: New abnormal confluent marrow edema and enhancement in the C7 and T1 vertebral bodies. There is bilateral pedicle involvement at both levels. Sagittal STIR images also suggest the possibility of C6 vertebral body involvement (series 21, image 11). Loss of the intervening C7-T1 disc space, and confluent enhancing prevertebral phlegmon are associated (series 39, image 13). The T1-T2 disc space is maintained. T2 and lower thoracic vertebral marrow signal remains normal. Cord: No thoracic spinal cord signal abnormality or abnormal intradural enhancement. There is limited detail  of the lower cervical spinal cord, but that visible at C6-C7 on series 23, image 1 appears to remain normal. The conus medullaris appears stable and normal at T12-L1. Paraspinal and other soft tissues: Enhancing prevertebral and lateral paraspinal phlegmon at C7-T1. No drainable abscess is identified at the cervicothoracic junction. Small to moderate bilateral layering pleural effusions. Posterior paraspinal soft tissues remain within normal limits. Disc levels: Abnormal at C7-T1 as stated above. The T1-T2 disc space remains  normal. The remaining thoracic levels are stable since August and negative. IMPRESSION: 1. 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 Questionable C6 involvement as well. Assuming this process does not spread caudally into other thoracic levels, Cervical Spine MRI without and with contrast will be the preferred modality for follow-up and surveillance. 2. Associated prevertebral phlegmon at C7 and T1. No drainable abscess is evident. 3. The T2 and lower thoracic levels remain normal. 4. Small to moderate bilateral layering pleural effusions. Electronically Signed: By: Genevie Ann M.D. On: 04/20/2018 14:46   Dg Esophagus  Result Date: 04/22/2018 CLINICAL DATA:  Status post transesophageal echocardiogram. Evaluate for esophageal abrasion. EXAM: ESOPHOGRAM/BARIUM SWALLOW TECHNIQUE: Combined double contrast and single contrast examination performed using effervescent water-soluble contrast (Isovue-300) and thin barium liquid. FLUOROSCOPY TIME:  Fluoroscopy Time:  0.7 minute Radiation Exposure Index (if provided by the fluoroscopic device): 4.6 mGy COMPARISON:  None. FINDINGS: There was normal pharyngeal anatomy and motility. Contrast flowed freely through the esophagus without evidence of stricture or mass. There was normal esophageal mucosa without evidence of irregularity or ulceration. Esophageal motility was normal. No evidence of reflux. Small hiatal hernia. No extraluminal contrast to suggest esophageal perforation. No mucosal defect to suggest mucosal injury. IMPRESSION: 1. No radiographic evidence of esophageal injury. No extraluminal contrast to suggest perforation. Electronically Signed   By: Kathreen Devoid   On: 04/22/2018 14:00   Mr Liver W Wo Contrast  Result Date: 04/20/2018 CLINICAL DATA:  Liver lesion. EXAM: MRI ABDOMEN WITHOUT AND WITH CONTRAST TECHNIQUE: Multiplanar multisequence MR imaging of the abdomen was performed both before and after the administration of intravenous contrast. CONTRAST:  6  cc Gadavist COMPARISON:  Chest CT 02/13/2018 FINDINGS: Lower chest: Bilateral pleural effusions. Hepatobiliary: Nodular hepatic contour is compatible with cirrhosis. Areas of parenchymal fibrosis noted within the liver. Lesion of concern on prior CT has increased in size in the interval measuring 4.0 x 3.5 cm in the medial segment left liver. This lesion has mildly increased signal on T1 weighted imaging and is nearly isointense to background liver on T2 weighted imaging. The lesion shows restricted diffusion. After IV contrast administration, there is early irregular but relatively diffuse mild hyperenhancement with a hypoenhancing rim. Central portion of the lesion washes out to a greater degree than background liver on delayed sequences with a persistent pseudo capsule (well seen coronal image 49/series 17). Imaging features are compatible with hepatocellular carcinoma (LI-RADS 5). Additional tiny foci of arterial phase hyperenhancement are identified measuring about 5 mm (2 on image 29/series 11 and 1 on image 50/series 11). No definite washout can be demonstrated on more delayed imaging and these 3 lesions are compatible with category LI-RADS 3. Low signal in the gallbladder lumen may be related to stones/sludge. No substantial biliary dilatation. Pancreas: No focal mass lesion. No dilatation of the main duct. No intraparenchymal cyst. No peripancreatic edema. Spleen:  14.4 cm craniocaudal length, upper normal. Adrenals/Urinary Tract: No adrenal nodule or mass. Multiple cysts are identified in the kidneys with scarring visible in the upper pole  of the left kidney. Stomach/Bowel: Stomach is nondistended. No gastric wall thickening. No evidence of outlet obstruction. Duodenum is normally positioned as is the ligament of Treitz. No small bowel or colonic dilatation within the visualized abdomen. Vascular/Lymphatic: No abdominal aortic aneurysm. Portal vein, superior mesenteric vein, and splenic vein are patent.  Paraesophageal varices noted. Other:  Ascites is visible in the abdomen. Musculoskeletal: No abnormal marrow signal within the visualized bony anatomy. IMPRESSION: 1. 4.0 cm mass in the medial segment left liver meets imaging criteria for hepatocellular carcinoma (LI-RADS 5). 2. 3 additional tiny hypervascular foci in the liver measure 5 mm or less in size and are indeterminate based on imaging criteria (LI-RADS 3). 3. Cirrhotic liver morphology. 4. Paraesophageal varices are compatible with portal venous hypertension. 5. Bilateral pleural effusions with associated ascites. Electronically Signed   By: Misty Stanley M.D.   On: 04/20/2018 16:39   Dg Chest Port 1 View  Result Date: 04/17/2018 CLINICAL DATA:  Acute lower abdominal pain. EXAM: PORTABLE CHEST 1 VIEW COMPARISON:  Radiographs of February 13, 2018. FINDINGS: The heart size and mediastinal contours are within normal limits. Both lungs are clear. No pneumothorax or pleural effusion is noted. The visualized skeletal structures are unremarkable. IMPRESSION: No acute cardiopulmonary abnormality seen. Electronically Signed   By: Marijo Conception, M.D.   On: 04/17/2018 12:47    Micro Results    Recent Results (from the past 240 hour(s))  CULTURE, BLOOD (ROUTINE X 2) w Reflex to ID Panel     Status: None   Collection Time: 04/27/18  3:51 PM  Result Value Ref Range Status   Specimen Description BLOOD BLOOD LEFT HAND  Final   Special Requests   Final    BOTTLES DRAWN AEROBIC AND ANAEROBIC Blood Culture results may not be optimal due to an inadequate volume of blood received in culture bottles   Culture   Final    NO GROWTH 5 DAYS Performed at [facility], [address] [place], [place] [address]    Report Status 05/02/2018 FINAL  Final  CULTURE, BLOOD (ROUTINE X 2) w Reflex to ID Panel     Status: None   Collection Time: 04/27/18  3:51 PM  Result Value Ref Range Status   Specimen Description BLOOD BLOOD RIGHT HAND  Final   Special  Requests   Final    BOTTLES DRAWN AEROBIC AND ANAEROBIC Blood Culture results may not be optimal due to an inadequate volume of blood received in culture bottles   Culture   Final    NO GROWTH 5 DAYS Performed at [facility], [address] [place], [place] [address]    Report Status 05/02/2018 FINAL  Final       Today   Subjective:   Theotis Burrow today has no further fever, tolerating the diet, wants to go home.  Objective:   Blood pressure (!) 148/75, pulse 68, temperature 98.5 F (36.9 C), temperature source Oral, resp. rate 20, height 5' 7" (1.702 m), weight 54.4 kg, SpO2 99 %.  No intake or output data in the 24 hours ending 05/06/18 1544  Exam Awake Alert, Oriented x 3, No new F.N deficits, Normal affect, chronically looking, malnourished. [place].AT,PERRAL Supple Neck,No JVD, No cervical lymphadenopathy appriciated.  Symmetrical Chest wall movement, Good air movement bilaterally, CTAB RRR,No Gallops,Rubs or new Murmurs, No Parasternal Heave +ve B.Sounds, Abd Soft, Non tender, No organomegaly appriciated, No rebound -guarding or rigidity. No Cyanosis, Clubbing or edema, No new Rash or bruise  Data Review   CBC w Diff:  Lab Results  Component Value Date   WBC 4.6 05/01/2018   HGB 9.9 (L) 05/01/2018   HGB 14.5 01/21/2014   HCT 29.2 (L) 05/01/2018   HCT 42.7 01/21/2014   PLT 43 (L) 05/01/2018   PLT 148 (L) 01/21/2014   LYMPHOPCT 38 05/01/2018   LYMPHOPCT 36.5 03/27/2013   MONOPCT 7 05/01/2018   MONOPCT 7.0 03/27/2013   EOSPCT 7 05/01/2018   EOSPCT 1.3 03/27/2013   BASOPCT 0 05/01/2018   BASOPCT 0.3 03/27/2013    CMP:  Lab Results  Component Value Date   NA 136 05/01/2018   NA 141 01/21/2014   K 3.7 05/01/2018   K 4.0 01/21/2014   CL 109 05/01/2018   CL 105 01/21/2014   CO2 24 05/01/2018   CO2 25 01/21/2014   BUN 12 05/01/2018   BUN 9 01/21/2014   CREATININE 0.48 (L) 05/01/2018   CREATININE 0.90 01/21/2014   PROT 7.4 05/01/2018   PROT  8.9 (H) 01/21/2014   ALBUMIN 1.8 (L) 05/01/2018   ALBUMIN 3.4 01/21/2014   BILITOT 1.4 (H) 05/01/2018   BILITOT 0.6 01/21/2014   ALKPHOS 138 (H) 05/01/2018   ALKPHOS 103 01/21/2014   AST 48 (H) 05/01/2018   AST 80 (H) 01/21/2014   ALT 12 05/01/2018   ALT 28 01/21/2014  .   Total Time in preparing paper work, data evaluation and todays exam - 35 minutes  Epifanio Lesches M.D on 05/02/2018 at 3:44 PM    Note: This dictation was prepared with Dragon dictation along with smaller phrase technology. Any transcriptional errors that result from this process are unintentional.

## 2018-05-10 LAB — CULTURE, BLOOD (ROUTINE X 2): Special Requests: ADEQUATE

## 2018-05-16 ENCOUNTER — Inpatient Hospital Stay: Payer: Medicare Other | Attending: Oncology | Admitting: Oncology

## 2018-05-16 ENCOUNTER — Encounter: Payer: Self-pay | Admitting: Infectious Diseases

## 2018-05-16 ENCOUNTER — Ambulatory Visit: Payer: Medicare Other | Attending: Infectious Diseases | Admitting: Infectious Diseases

## 2018-05-16 ENCOUNTER — Other Ambulatory Visit
Admission: RE | Admit: 2018-05-16 | Discharge: 2018-05-16 | Disposition: A | Discharge status: Home / Self Care | Payer: Medicare Other | Source: Ambulatory Visit | Attending: Infectious Diseases | Admitting: Infectious Diseases

## 2018-05-16 ENCOUNTER — Encounter: Payer: Self-pay | Admitting: Oncology

## 2018-05-16 VITALS — BP 125/80 | HR 91 | Temp 97.9°F | Wt 126.5 lb

## 2018-05-16 VITALS — BP 119/71 | HR 90 | Temp 98.3°F | Resp 18 | Ht 67.0 in | Wt 122.6 lb

## 2018-05-16 DIAGNOSIS — D696 Thrombocytopenia, unspecified: Secondary | ICD-10-CM

## 2018-05-16 DIAGNOSIS — M545 Low back pain: Secondary | ICD-10-CM | POA: Diagnosis not present

## 2018-05-16 DIAGNOSIS — C220 Liver cell carcinoma: Secondary | ICD-10-CM | POA: Diagnosis present

## 2018-05-16 DIAGNOSIS — M4620 Osteomyelitis of vertebra, site unspecified: Secondary | ICD-10-CM | POA: Diagnosis present

## 2018-05-16 DIAGNOSIS — M4623 Osteomyelitis of vertebra, cervicothoracic region: Secondary | ICD-10-CM | POA: Insufficient documentation

## 2018-05-16 DIAGNOSIS — B372 Candidiasis of skin and nail: Secondary | ICD-10-CM | POA: Insufficient documentation

## 2018-05-16 DIAGNOSIS — Z720 Tobacco use: Secondary | ICD-10-CM | POA: Diagnosis not present

## 2018-05-16 DIAGNOSIS — D539 Nutritional anemia, unspecified: Secondary | ICD-10-CM

## 2018-05-16 DIAGNOSIS — B1920 Unspecified viral hepatitis C without hepatic coma: Secondary | ICD-10-CM

## 2018-05-16 DIAGNOSIS — I330 Acute and subacute infective endocarditis: Secondary | ICD-10-CM

## 2018-05-16 DIAGNOSIS — F1910 Other psychoactive substance abuse, uncomplicated: Secondary | ICD-10-CM | POA: Diagnosis not present

## 2018-05-16 DIAGNOSIS — B49 Unspecified mycosis: Secondary | ICD-10-CM

## 2018-05-16 DIAGNOSIS — R531 Weakness: Secondary | ICD-10-CM

## 2018-05-16 DIAGNOSIS — M4643 Discitis, unspecified, cervicothoracic region: Secondary | ICD-10-CM | POA: Diagnosis not present

## 2018-05-16 DIAGNOSIS — B1910 Unspecified viral hepatitis B without hepatic coma: Secondary | ICD-10-CM | POA: Diagnosis not present

## 2018-05-16 DIAGNOSIS — Z9889 Other specified postprocedural states: Secondary | ICD-10-CM

## 2018-05-16 DIAGNOSIS — Z872 Personal history of diseases of the skin and subcutaneous tissue: Secondary | ICD-10-CM

## 2018-05-16 DIAGNOSIS — R7881 Bacteremia: Secondary | ICD-10-CM

## 2018-05-16 DIAGNOSIS — Z79899 Other long term (current) drug therapy: Secondary | ICD-10-CM | POA: Insufficient documentation

## 2018-05-16 DIAGNOSIS — D649 Anemia, unspecified: Secondary | ICD-10-CM | POA: Diagnosis not present

## 2018-05-16 DIAGNOSIS — Z792 Long term (current) use of antibiotics: Secondary | ICD-10-CM

## 2018-05-16 DIAGNOSIS — B9689 Other specified bacterial agents as the cause of diseases classified elsewhere: Secondary | ICD-10-CM | POA: Diagnosis not present

## 2018-05-16 DIAGNOSIS — F1190 Opioid use, unspecified, uncomplicated: Secondary | ICD-10-CM

## 2018-05-16 DIAGNOSIS — R5382 Chronic fatigue, unspecified: Secondary | ICD-10-CM

## 2018-05-16 DIAGNOSIS — Z8619 Personal history of other infectious and parasitic diseases: Secondary | ICD-10-CM

## 2018-05-16 DIAGNOSIS — C229 Malignant neoplasm of liver, not specified as primary or secondary: Secondary | ICD-10-CM

## 2018-05-16 LAB — COMPREHENSIVE METABOLIC PANEL
ALK PHOS: 131 U/L — AB (ref 38–126)
ALT: 19 U/L (ref 0–44)
ANION GAP: 7 (ref 5–15)
AST: 66 U/L — ABNORMAL HIGH (ref 15–41)
Albumin: 2.3 g/dL — ABNORMAL LOW (ref 3.5–5.0)
BILIRUBIN TOTAL: 1.6 mg/dL — AB (ref 0.3–1.2)
BUN: 10 mg/dL (ref 8–23)
CO2: 23 mmol/L (ref 22–32)
Calcium: 8.5 mg/dL — ABNORMAL LOW (ref 8.9–10.3)
Chloride: 109 mmol/L (ref 98–111)
Creatinine, Ser: 0.71 mg/dL (ref 0.61–1.24)
GFR calc non Af Amer: >60 mL/min (ref >60)
GLUCOSE: 96 mg/dL (ref 70–99)
Potassium: 4.4 mmol/L (ref 3.5–5.1)
Sodium: 139 mmol/L (ref 135–145)
Total Protein: 8.6 g/dL — ABNORMAL HIGH (ref 6.5–8.1)

## 2018-05-16 LAB — SEDIMENTATION RATE: Sed Rate: >140 mm/hr — ABNORMAL HIGH (ref 0–20)

## 2018-05-16 LAB — C-REACTIVE PROTEIN: CRP: <0.8 mg/dL (ref <1.0)

## 2018-05-16 MED ORDER — FLUCONAZOLE 200 MG PO TABS: 600.0000 mg | ORAL_TABLET | Freq: Every day | ORAL | 3 refills | Status: DC

## 2018-05-16 MED ORDER — LEVOFLOXACIN 750 MG PO TABS: 750.0000 mg | ORAL_TABLET | Freq: Every day | ORAL | 0 refills | Status: DC

## 2018-05-16 NOTE — Progress Notes (Signed)
NAME: Andrew Gibbs  DOB: October 14, 1955  MRN: [id number]  Date/Time: 05/16/2018 9:26 AM Subjective:  Follow up visit after discharge from hospital  Pt was in [name] between 04/17/18 to 10/17 for fungemia and bacteremia Sister is with him today? Andrew Gibbs is a 62 y.o. male with a history   of hepatitis C and daily IV heroin use, skin popping   was admitted with fever and chills and also pain and swelling of the right upper arm.and back pain.  Patient had Candida lusitaniae   fungemia, stenotrophomonas in the blood culture, flavobacterium in blood  and Serratia in wound culture. HE had MRI of the spine and it revealed c7-T1 discitis and TEE done on 10/6 showed  a small,irregular, solid, mobile vegetation on the left ventricularaspect of the left coronary cusp; the appearance is consistent with vegetation could not exclude aortic valve vegetation) After IV echinocandin for 2 weeks he was discharged home on PO fluconazole 10mg /kg-600mg  once a day and Po levaquin He says he is 100% adherent Still has pain upper back No side effects from meds like rash or diarrhea All skin lesion shave healed He is living on his own Says he has not done any drugs since discharge   Past Medical History:  Diagnosis Date  . Hepatitis C   . Heroin use     Past Surgical History:  Procedure Laterality Date  . KNEE SURGERY    . TEE WITHOUT CARDIOVERSION N/A 04/22/2018   Procedure: TRANSESOPHAGEAL ECHOCARDIOGRAM (TEE);  Surgeon: Teodoro Spray, MD;  Location: [facility] ORS;  Service: Cardiovascular;  Laterality: N/A;    No Known Allergies  FH Mother had bone cancer-   SH Smoker Used to do crack cocaine and now IV heroin- none since discharge 2 weeks ago? Current Outpatient Medications  Medication Sig Dispense Refill  . fluconazole (DIFLUCAN) 200 MG tablet Take 3 tablets (600 mg total) by mouth daily. 60 tablet 2  . levofloxacin (LEVAQUIN) 750 MG tablet Take 1 tablet (750 mg total) by mouth daily for 28 days.  28 tablet 0  . Multiple Vitamin (MULTIVITAMIN WITH MINERALS) TABS tablet Take 1 tablet by mouth daily. 30 tablet 0  . oxyCODONE (OXY IR/ROXICODONE) 5 MG immediate release tablet Take 1 tablet (5 mg total) by mouth every 4 (four) hours as needed for moderate pain. 20 tablet 0  . feeding supplement, ENSURE ENLIVE, (ENSURE ENLIVE) LIQD Take 237 mLs by mouth 2 (two) times daily between meals. 237 mL 12  . potassium chloride SA (KLOR-CON M20) 20 MEQ tablet Take 1 tablet (20 mEq total) by mouth daily. (Patient not taking: Reported on 04/17/2018) 7 tablet 0  . vitamin C (VITAMIN C) 250 MG tablet Take 1 tablet (250 mg total) by mouth 2 (two) times daily. (Patient not taking: Reported on 05/16/2018) 30 tablet 0    REVIEW OF SYSTEMS:  Const: negative fever, negative chills, negative weight loss Eyes: negative diplopia or visual changes, negative eye pain ENT: negative coryza, negative sore throat Resp: negative cough, hemoptysis, dyspnea Cards: negative for chest pain, palpitations, lower extremity edema GU: negative for frequency, dysuria and hematuria GI: Negative for abdominal pain diarrhea, bleeding, constipation Skin: negative for rash and pruritus Heme: negative for easy bruising and gum/nose bleeding MS: negative for myalgias, arthralgias, back pain and muscle weakness Neurolo:negative for headaches, dizziness, vertigo, memory problems  Psych: negative for feelings of anxiety, depression  Endocrine:no polyuria or polydipsia Allergy/Immunology- negative for any medication or food allergies ?  Objective:  VITALS:  BP 125/80 (BP Location: Left Arm, Patient Position: Sitting, Cuff Size: Normal)   Pulse 91   Temp 97.9 F (36.6 C) (Oral)   Wt 126 lb 8 oz (57.4 kg)   BMI 19.81 kg/m  PHYSICAL EXAM:  General: Alert, cooperative, no distress, appears stated age.  Head: Normocephalic, without obvious abnormality, atraumatic. Eyes: Conjunctivae clear, anicteric sclerae. Pupils are equal ENT  Nares normal. No drainage or sinus tenderness. Lips, mucosa, and tongue normal. No Thrush Neck: Supple, symmetrical, no adenopathy, thyroid: non tender no carotid bruit and no JVD. Back: No CVA tenderness. Lungs: Clear to auscultation bilaterally. No Wheezing or Rhonchi. No rales. Heart: Regular rate and rhythm, no murmur, rub or gallop. Abdomen: Soft, non-tender,not distended. Bowel sounds normal. No masses Extremities: atraumatic, no cyanosis. No edema. No clubbing Skin:healed lesions arms Lymph: Cervical, supraclavicular normal. Neurologic: Grossly non-focal Pertinent Labs Lab Results CBC    Component Value Date/Time   WBC 4.6 05/01/2018 0608   RBC 3.00 (L) 05/01/2018 0608   HGB 9.9 (L) 05/01/2018 0608   HGB 14.5 01/21/2014 1700   HCT 29.2 (L) 05/01/2018 0608   HCT 42.7 01/21/2014 1700   PLT 43 (L) 05/01/2018 0608   PLT 148 (L) 01/21/2014 1700   MCV 97.3 05/01/2018 0608   MCV 102 (H) 01/21/2014 1700   MCH 33.0 05/01/2018 0608   MCHC 33.9 05/01/2018 0608   RDW 18.6 (H) 05/01/2018 0608   RDW 14.9 (H) 01/21/2014 1700   LYMPHSABS 1.8 05/01/2018 0608   LYMPHSABS 2.0 03/27/2013 0539   MONOABS 0.3 05/01/2018 0608   MONOABS 0.4 03/27/2013 0539   EOSABS 0.3 05/01/2018 0608   EOSABS 0.1 03/27/2013 0539   BASOSABS 0.0 05/01/2018 0608   BASOSABS 0.0 03/27/2013 0539    CMP Latest Ref Rng & Units 05/01/2018 04/26/2018 04/24/2018  Glucose 70 - 99 mg/dL 94 - 92  BUN 8 - 23 mg/dL 12 - <5(L)  Creatinine 0.61 - 1.24 mg/dL 0.48(L) 0.66 0.50(L)  Sodium 135 - 145 mmol/L 136 - 135  Potassium 3.5 - 5.1 mmol/L 3.7 - 3.9  Chloride 98 - 111 mmol/L 109 - 108  CO2 22 - 32 mmol/L 24 - 23  Calcium 8.9 - 10.3 mg/dL 8.0(L) - 7.8(L)  Total Protein 6.5 - 8.1 g/dL 7.4 - -  Total Bilirubin 0.3 - 1.2 mg/dL 1.4(H) - -  Alkaline Phos 38 - 126 U/L 138(H) - -  AST 15 - 41 U/L 48(H) - -  ALT 0 - 44 U/L 12 - -      Microbiology: No results found for this or any previous visit (from the past 240  hour(s)). IMAGING RESULTS: ?Positive for discitis osteomyelitis at C7-T1. Questionable C6 involvement as well. 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 On levaquin currently- will need until 06/16/18. 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  In July the MRI of the spine was normal. 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 There was a small,irregular, solid, mobile vegetation on the left ventricularaspect of the left coronary cusp; the appearance is consistent with vegetation) For candida endocarditis surgery is recommended, In his case as it small vegetation on the aortic cusp it would be prudent to treat him with antibiotics and then repeat echo to look at valve function  Anemia: In 2016 he had normal hemoglobin and on this admission it was 6.3 and had to have blood transfusion.  It is  macrocytic.  With underlying hepatitis C need to rule out cirrhosis with the GI bleed or malignancy.  Also need to check B12 and folate  Hepatitis C has not been treated. Has liver cancer with high AFP- he is seeing oncologist today  HIV  negative   Discussed the management with patient and his sister  Will see him back in 60 weeks? ?

## 2018-05-16 NOTE — Patient Instructions (Addendum)
You are here for follow up of infection with candida and other organisms affecting your spine and heart. You are taking fluconazole 600mg  once a day and levaquin 750mg  once a day. You will take levauin for a total of 6 weeks ( until 06/30/18). The fluconazole you will take for a long time. Will see you in 8 weeks Will do labs today and depending on the result will order future labs

## 2018-05-16 NOTE — Progress Notes (Signed)
Met with Andrew Gibbs and his sister, [name]. Introduced Therapist, nutritional and provided contact information for future needs. Dr. Grayland Ormond will discuss with IR and we will contact [facility] with treatment plan.

## 2018-05-17 ENCOUNTER — Telehealth: Payer: Self-pay | Admitting: *Deleted

## 2018-05-17 DIAGNOSIS — C220 Liver cell carcinoma: Secondary | ICD-10-CM | POA: Insufficient documentation

## 2018-05-17 NOTE — Progress Notes (Signed)
[place]  [phone] [phone]  ID: Augusto Garbe OB: 06/25/1956  MR#: [id number]  KDT#:[id number]  Patient Care Team: Center, [facility] as PCP - General (General Practice) Clent Jacks, RN as Registered Nurse  CHIEF COMPLAINT: Hepatocellular carcinoma.  INTERVAL HISTORY: Patient is a 62 year old male who was initially evaluated in the hospital after being admitted for sepsis with both yeast and bacteria in his blood.  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  MRI of the abdomen revealed a 4 cm mass in his liver.  Along with an AFP of greater than 2000 pathognomonic for hepatocellular carcinoma.  Patient feels improved from admission.  He has chronic weakness and fatigue.  He complains of persistent back pain.  He has no neurologic complaints.  He denies any recent fevers.  He has a fair appetite, but denies weight loss.  He has no chest pain, cough, shortness of breath, or hemoptysis.  He has no nausea, vomiting, constipation, or diarrhea.  He has no urinary complaints.  Patient offers no further specific complaints today.  REVIEW OF SYSTEMS:   Review of Systems  Constitutional: Positive for malaise/fatigue. Negative for fever and weight loss.  Respiratory: Negative.  Negative for cough, hemoptysis and shortness of breath.   Cardiovascular: Negative.  Negative for chest pain and leg swelling.  Gastrointestinal: Negative.  Negative for abdominal pain, constipation and diarrhea.  Genitourinary: Negative for dysuria.  Musculoskeletal: Positive for back pain.  Skin: Negative.  Negative for rash.  Neurological: Positive for weakness. Negative for focal weakness and headaches.  Psychiatric/Behavioral: Positive for substance abuse. The patient is not nervous/anxious.     As per HPI. Otherwise, a complete review of systems is negative.  PAST MEDICAL HISTORY: Past Medical History:  Diagnosis Date  . Hepatitis C   .  Heroin use     PAST SURGICAL HISTORY: Past Surgical History:  Procedure Laterality Date  . KNEE SURGERY    . TEE WITHOUT CARDIOVERSION N/A 04/22/2018   Procedure: TRANSESOPHAGEAL ECHOCARDIOGRAM (TEE);  Surgeon: Teodoro Spray, MD;  Location: [facility] ORS;  Service: Cardiovascular;  Laterality: N/A;    FAMILY HISTORY: Family History  Problem Relation Age of Onset  . Multiple myeloma Mother     ADVANCED DIRECTIVES (Y/N):  N  HEALTH MAINTENANCE: Social History   Tobacco Use  . Smoking status: Current Every Day Smoker  . Smokeless tobacco: Never Used  Substance Use Topics  . Alcohol use: Yes    Comment: 40 oz today  . Drug use: Yes    Types: IV, Cocaine    Comment: heroin, states he used to use cocaine     Colonoscopy:  PAP:  Bone density:  Lipid panel:  No Known Allergies  Current Outpatient Medications  Medication Sig Dispense Refill  . feeding supplement, ENSURE ENLIVE, (ENSURE ENLIVE) LIQD Take 237 mLs by mouth 2 (two) times daily between meals. 237 mL 12  . fluconazole (DIFLUCAN) 200 MG tablet Take 3 tablets (600 mg total) by mouth daily. 90 tablet 3  . levofloxacin (LEVAQUIN) 750 MG tablet Take 1 tablet (750 mg total) by mouth daily. 30 tablet 0  . Multiple Vitamin (MULTIVITAMIN WITH MINERALS) TABS tablet Take 1 tablet by mouth daily. 30 tablet 0  . oxyCODONE (OXY IR/ROXICODONE) 5 MG immediate release tablet Take 1 tablet (5 mg total) by mouth every 4 (four) hours as needed for moderate pain. (Patient not taking: Reported on 05/16/2018) 20 tablet 0  .  potassium chloride SA (KLOR-CON M20) 20 MEQ tablet Take 1 tablet (20 mEq total) by mouth daily. (Patient not taking: Reported on 04/17/2018) 7 tablet 0  . vitamin C (VITAMIN C) 250 MG tablet Take 1 tablet (250 mg total) by mouth 2 (two) times daily. (Patient not taking: Reported on 05/16/2018) 30 tablet 0   No current facility-administered medications for this visit.     OBJECTIVE: Vitals:   05/16/18 1351  BP: 119/71   Pulse: 90  Resp: 18  Temp: 98.3 F (36.8 C)  SpO2: 99%     Body mass index is 19.2 kg/m.    ECOG FS:0 - Asymptomatic  General: Thin, mildly disheveled, no acute distress. Eyes: Pink conjunctiva, anicteric sclera. HEENT: Normocephalic, moist mucous membranes, clear oropharnyx. Lungs: Clear to auscultation bilaterally. Heart: Regular rate and rhythm. No rubs, murmurs, or gallops. Abdomen: Soft, nontender, nondistended. No organomegaly noted, normoactive bowel sounds. Musculoskeletal: No edema, cyanosis, or clubbing. Neuro: Alert, answering all questions appropriately. Cranial nerves grossly intact. Skin: No rashes or petechiae noted. Psych: Normal affect. Lymphatics: No cervical, calvicular, axillary or inguinal LAD.   LAB RESULTS:  Lab Results  Component Value Date   NA 139 05/16/2018   K 4.4 05/16/2018   CL 109 05/16/2018   CO2 23 05/16/2018   GLUCOSE 96 05/16/2018   BUN 10 05/16/2018   CREATININE 0.71 05/16/2018   CALCIUM 8.5 (L) 05/16/2018   PROT 8.6 (H) 05/16/2018   ALBUMIN 2.3 (L) 05/16/2018   AST 66 (H) 05/16/2018   ALT 19 05/16/2018   ALKPHOS 131 (H) 05/16/2018   BILITOT 1.6 (H) 05/16/2018   GFRNONAA >60 05/16/2018   GFRAA >60 05/16/2018    Lab Results  Component Value Date   WBC 4.6 05/01/2018   NEUTROABS 2.2 05/01/2018   HGB 9.9 (L) 05/01/2018   HCT 29.2 (L) 05/01/2018   MCV 97.3 05/01/2018   PLT 43 (L) 05/01/2018     STUDIES: Mr Thoracic Spine W Wo Contrast  Addendum Date: 04/20/2018   ADDENDUM REPORT: 04/20/2018 15:17 ADDENDUM: Study discussed by telephone with Dr. Posey Pronto on 04/20/2018 at at 1507 hours. Electronically Signed   By: Genevie Ann M.D.   On: 04/20/2018 15:17   Result Date: 04/20/2018 CLINICAL DATA:  62 year old male with hepatitis C, IV drug use, back pain. Right upper extremity abscess. EXAM: MRI THORACIC WITHOUT AND WITH CONTRAST TECHNIQUE: Multiplanar and multiecho pulse sequences of the thoracic spine were obtained without and with  intravenous contrast. CONTRAST:  6 milliliters Gadavist COMPARISON:  Cervical and thoracic spine MRI 02/14/2018. FINDINGS: Limited cervical spine imaging: Chronic degenerative endplate marrow signal changes throughout the cervical spine, but abnormal decreased T1 signal in the C7 and T1 vertebrae. Thoracic spine segmentation: Appears to be normal, the same numbering is used as on the 02/14/2018 comparison. Alignment:  Preserved thoracic vertebral alignment, kyphosis. Vertebrae: New abnormal confluent marrow edema and enhancement in the C7 and T1 vertebral bodies. There is bilateral pedicle involvement at both levels. Sagittal STIR images also suggest the possibility of C6 vertebral body involvement (series 21, image 11). Loss of the intervening C7-T1 disc space, and confluent enhancing prevertebral phlegmon are associated (series 39, image 13). The T1-T2 disc space is maintained. T2 and lower thoracic vertebral marrow signal remains normal. Cord: No thoracic spinal cord signal abnormality or abnormal intradural enhancement. There is limited detail of the lower cervical spinal cord, but that visible at C6-C7 on series 23, image 1 appears to remain normal. The conus medullaris appears stable and  normal at T12-L1. Paraspinal and other soft tissues: Enhancing prevertebral and lateral paraspinal phlegmon at C7-T1. No drainable abscess is identified at the cervicothoracic junction. Small to moderate bilateral layering pleural effusions. Posterior paraspinal soft tissues remain within normal limits. Disc levels: Abnormal at C7-T1 as stated above. The T1-T2 disc space remains normal. The remaining thoracic levels are stable since August and negative. IMPRESSION: 1. 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 Questionable C6 involvement as well. Assuming this process does not spread caudally into other thoracic levels, Cervical Spine MRI without and with contrast will be the preferred modality for follow-up and  surveillance. 2. Associated prevertebral phlegmon at C7 and T1. No drainable abscess is evident. 3. The T2 and lower thoracic levels remain normal. 4. Small to moderate bilateral layering pleural effusions. Electronically Signed: By: Genevie Ann M.D. On: 04/20/2018 14:46   Dg Esophagus  Result Date: 04/22/2018 CLINICAL DATA:  Status post transesophageal echocardiogram. Evaluate for esophageal abrasion. EXAM: ESOPHOGRAM/BARIUM SWALLOW TECHNIQUE: Combined double contrast and single contrast examination performed using effervescent water-soluble contrast (Isovue-300) and thin barium liquid. FLUOROSCOPY TIME:  Fluoroscopy Time:  0.7 minute Radiation Exposure Index (if provided by the fluoroscopic device): 4.6 mGy COMPARISON:  None. FINDINGS: There was normal pharyngeal anatomy and motility. Contrast flowed freely through the esophagus without evidence of stricture or mass. There was normal esophageal mucosa without evidence of irregularity or ulceration. Esophageal motility was normal. No evidence of reflux. Small hiatal hernia. No extraluminal contrast to suggest esophageal perforation. No mucosal defect to suggest mucosal injury. IMPRESSION: 1. No radiographic evidence of esophageal injury. No extraluminal contrast to suggest perforation. Electronically Signed   By: Kathreen Devoid   On: 04/22/2018 14:00   Mr Liver W Wo Contrast  Result Date: 04/20/2018 CLINICAL DATA:  Liver lesion. EXAM: MRI ABDOMEN WITHOUT AND WITH CONTRAST TECHNIQUE: Multiplanar multisequence MR imaging of the abdomen was performed both before and after the administration of intravenous contrast. CONTRAST:  6 cc Gadavist COMPARISON:  Chest CT 02/13/2018 FINDINGS: Lower chest: Bilateral pleural effusions. Hepatobiliary: Nodular hepatic contour is compatible with cirrhosis. Areas of parenchymal fibrosis noted within the liver. Lesion of concern on prior CT has increased in size in the interval measuring 4.0 x 3.5 cm in the medial segment left liver.  This lesion has mildly increased signal on T1 weighted imaging and is nearly isointense to background liver on T2 weighted imaging. The lesion shows restricted diffusion. After IV contrast administration, there is early irregular but relatively diffuse mild hyperenhancement with a hypoenhancing rim. Central portion of the lesion washes out to a greater degree than background liver on delayed sequences with a persistent pseudo capsule (well seen coronal image 49/series 17). Imaging features are compatible with hepatocellular carcinoma (LI-RADS 5). Additional tiny foci of arterial phase hyperenhancement are identified measuring about 5 mm (2 on image 29/series 11 and 1 on image 50/series 11). No definite washout can be demonstrated on more delayed imaging and these 3 lesions are compatible with category LI-RADS 3. Low signal in the gallbladder lumen may be related to stones/sludge. No substantial biliary dilatation. Pancreas: No focal mass lesion. No dilatation of the main duct. No intraparenchymal cyst. No peripancreatic edema. Spleen:  14.4 cm craniocaudal length, upper normal. Adrenals/Urinary Tract: No adrenal nodule or mass. Multiple cysts are identified in the kidneys with scarring visible in the upper pole of the left kidney. Stomach/Bowel: Stomach is nondistended. No gastric wall thickening. No evidence of outlet obstruction. Duodenum is normally positioned as is the ligament of  Treitz. No small bowel or colonic dilatation within the visualized abdomen. Vascular/Lymphatic: No abdominal aortic aneurysm. Portal vein, superior mesenteric vein, and splenic vein are patent. Paraesophageal varices noted. Other:  Ascites is visible in the abdomen. Musculoskeletal: No abnormal marrow signal within the visualized bony anatomy. IMPRESSION: 1. 4.0 cm mass in the medial segment left liver meets imaging criteria for hepatocellular carcinoma (LI-RADS 5). 2. 3 additional tiny hypervascular foci in the liver measure 5 mm or  less in size and are indeterminate based on imaging criteria (LI-RADS 3). 3. Cirrhotic liver morphology. 4. Paraesophageal varices are compatible with portal venous hypertension. 5. Bilateral pleural effusions with associated ascites. Electronically Signed   By: Misty Stanley M.D.   On: 04/20/2018 16:39   Dg Chest Port 1 View  Result Date: 04/17/2018 CLINICAL DATA:  Acute lower abdominal pain. EXAM: PORTABLE CHEST 1 VIEW COMPARISON:  Radiographs of February 13, 2018. FINDINGS: The heart size and mediastinal contours are within normal limits. Both lungs are clear. No pneumothorax or pleural effusion is noted. The visualized skeletal structures are unremarkable. IMPRESSION: No acute cardiopulmonary abnormality seen. Electronically Signed   By: Marijo Conception, M.D.   On: 04/17/2018 12:47    ASSESSMENT: Hepatocellular carcinoma:  PLAN:    1.  Hepatocellular carcinoma: MRI results from April 20, 2018 reviewed independently and report as above with 4 cm mass in patient's liver.  With active hepatitis B and hepatitis C as well as a AFP of 2219 this is pathognomonic for hepatocellular carcinoma.  Patient does not appear to have any other evidence of disease.  Case has been discussed with interventional radiology and it was agreed upon that lesion is amenable for ablation.  Patient will likely not tolerate systemic chemotherapy if necessary.  A referral has been sent to interventional radiology.  Patient will return to clinic several weeks after his ablation for further evaluation. 2.  Thrombocytopenia: Patient's most recent platelet count is 43 which is approximately his baseline.  Likely secondary to cirrhosis.  Patient's spleen measures 14.4 cm which is the upper limit of normal. 3.  Anemia: Mild, monitor. 4.  Sepsis/hepatitis B/hepatitis C: Continue follow-up, treatment, and evaluation per infectious disease.   Patient expressed understanding and was in agreement with this plan. He also understands that He  can call clinic at any time with any questions, concerns, or complaints.   Cancer Staging Hepatocellular carcinoma [facility]) Staging form: Liver, AJCC 8th Edition - Clinical stage from 05/17/2018: Stage IB (cT1b, cN0, cM0) - Signed by Lloyd Huger, MD on 05/17/2018   Lloyd Huger, MD   05/17/2018 10:35 AM

## 2018-05-17 NOTE — Telephone Encounter (Signed)
Call placed to patients sister to update on follow up. Dr. Grayland Ormond spoke with IR, patient is candidate for ablation. Referral will be sent to IR, patient will follow up with Dr. Grayland Ormond after ablation. Patients sister verbalized understanding of plan.

## 2018-05-20 ENCOUNTER — Other Ambulatory Visit: Payer: Self-pay | Admitting: Oncology

## 2018-05-20 DIAGNOSIS — R160 Hepatomegaly, not elsewhere classified: Secondary | ICD-10-CM

## 2018-05-22 ENCOUNTER — Ambulatory Visit
Admission: RE | Admit: 2018-05-22 | Discharge: 2018-05-22 | Disposition: A | Discharge status: Home / Self Care | Payer: Medicaid Other | Source: Ambulatory Visit | Attending: Oncology | Admitting: Oncology

## 2018-05-22 DIAGNOSIS — R160 Hepatomegaly, not elsewhere classified: Secondary | ICD-10-CM

## 2018-05-22 HISTORY — DX: Disease of blood and blood-forming organs, unspecified: D75.9

## 2018-05-22 HISTORY — PX: IR RADIOLOGIST EVAL & MGMT: IMG5224

## 2018-05-22 NOTE — Consult Note (Signed)
Chief Complaint: Liver mass, [place]  Referring Physician(s): Finnegan,Timothy J PCP:  No doctor currently.  [facility] ID: Dr. Delaine Lame  History of Present Illness: Andrew Gibbs is a 62 y.o. male presenting to [place] clinic today, kindly referred by Dr. Grayland Ormond, for evaluation of candidacy for liver-directed therapy to treat a newly discovered liver mass.   Andrew Gibbs is here today with his brother for our interview.   Apparently the liver mass was incidentally imaged on CT chest performed 02/13/2018.  At this time he was treated for back pain at the [facility] ED, and was [place] home.  He then was admitted through the ED 04/16/2018 with RUE abscess, fungemia, bacteremia, discitis/osteomyelitis of C7-T1 on MRI, and ECHO showing aortic valve vegetation/endocarditis.    He was admitted for care 10/1 and was discharged 05/02/2018.  He has had follow up with Oncology and Infectious Disease.    ID note 10/31: plan to continue fluconazole indefinitely.  Plan for continuing levaquin at least until 06/16/2018 for discitis and endocarditis Plan to have back in 8 weeks to the clinic  Andrew Gibbs tells me he has some upper back pain correlating to his discitis/osteomyelitis, though has been improving.  He denies any recent fever rigors chills.  He does admit to daily ETOH use, and states his last IV drug use with heroine was early October.  He says he smokes cigarettes and  He does seem motivated to get treated for his newly diagnosed [facility], though cannot verbalize his physicians' treatment plans very well.  He did tell me that he is currently seeking a new PCP.    He tells me he lives by himself and manages all of his daily affairs.  I cannot get a history of any increased time in bed during the day, and so he has ECOG of 0.  He lives alone.  He says he has children who live in [place], with whom he does not communicate.  His wife has passed.    He has a MELD calculated today of  13 He has a Child Pugh calculated today of B.    MRI that was performed 04/20/2018 demonstrates 4cm tumor of segment 4.  His AFP is >2200.  He has ascites on MRI, and has never had paracentesis.  He has imaging of cirrhosis.      Past Medical History:  Diagnosis Date  . Blood dyscrasia    thrombocytopenia  . Hepatitis C   . Heroin use     Past Surgical History:  Procedure Laterality Date  . KNEE SURGERY    . TEE WITHOUT CARDIOVERSION N/A 04/22/2018   Procedure: TRANSESOPHAGEAL ECHOCARDIOGRAM (TEE);  Surgeon: Teodoro Spray, MD;  Location: [facility] ORS;  Service: Cardiovascular;  Laterality: N/A;    Allergies: Patient has no known allergies.  Medications: Prior to Admission medications   Medication Sig Start Date End Date Taking? Authorizing Provider  fluconazole (DIFLUCAN) 200 MG tablet Take 3 tablets (600 mg total) by mouth daily. 05/16/18  Yes Tsosie Billing, MD  levofloxacin (LEVAQUIN) 750 MG tablet Take 1 tablet (750 mg total) by mouth daily. 05/16/18  Yes Tsosie Billing, MD  Multiple Vitamin (MULTIVITAMIN WITH MINERALS) TABS tablet Take 1 tablet by mouth daily. 05/03/18  Yes Epifanio Lesches, MD     Family History  Problem Relation Age of Onset  . Multiple myeloma Mother     Social History   Socioeconomic History  . Marital status: Widowed    Spouse name: Not  on file  . Number of children: Not on file  . Years of education: Not on file  . Highest education level: Not on file  Occupational History  . Not on file  Social Needs  . Financial resource strain: Not on file  . Food insecurity:    Worry: Not on file    Inability: Not on file  . Transportation needs:    Medical: Not on file    Non-medical: Not on file  Tobacco Use  . Smoking status: Current Every Day Smoker  . Smokeless tobacco: Never Used  Substance and Sexual Activity  . Alcohol use: Yes    Comment: 40 oz today  . Drug use: Yes    Types: IV, Cocaine, Heroin    Comment:  heroin, states he used to use cocaine  . Sexual activity: Not on file  Lifestyle  . Physical activity:    Days per week: Not on file    Minutes per session: Not on file  . Stress: Not on file  Relationships  . Social connections:    Talks on phone: Not on file    Gets together: Not on file    Attends religious service: Not on file    Active member of club or organization: Not on file    Attends meetings of clubs or organizations: Not on file    Relationship status: Not on file  Other Topics Concern  . Not on file  Social History Narrative  . Not on file    ECOG Status: 0 - Asymptomatic  Review of Systems: A 12 point ROS discussed and pertinent positives are indicated in the HPI above.  All other systems are negative.  Review of Systems  Vital Signs: BP 111/71 (BP Location: Right Arm, Patient Position: Sitting, Cuff Size: Normal)   Pulse 81   Temp 98.3 F (36.8 C)   Resp 16   Ht 5' 7" (1.702 m)   Wt 57.6 kg   SpO2 100%   BMI 19.89 kg/m   Physical Exam General: 62 yo male appearing older than stated age.  Well-developed, well-nourished.  No distress. HEENT: Atraumatic, normocephalic.  Conjugate gaze, extra-ocular motor intact. No scleral icterus or scleral injection. No lesions on external ears, nose, lips, or gums.  Oral mucosa moist, pink.  Neck: Symmetric with no goiter enlargement.  Chest/Lungs:  Symmetric chest with inspiration/expiration.  No labored breathing.  Clear to auscultation with no wheezes, rhonchi, or rales.  Heart:  RRR, with no third heart sounds appreciated. No JVD appreciated.  Abdomen:  Soft, NT/ND, with + bowel sounds.   Genito-urinary: Deferred Neurologic: Alert & Oriented to person, place, and time.   Normal affect and insight.  Appropriate questions.  Moving all 4 extremities with gross sensory intact.  Pulse Exam:  Palpable radial pulses  Mallampati Score:     Imaging: No results found.  Labs:  CBC: Recent Labs    04/18/18 0543  04/21/18 0347 04/24/18 0515 05/01/18 0608  WBC 6.2 4.5 4.1 4.6  HGB 8.9* 10.3* 10.8* 9.9*  HCT 25.0* 30.5* 32.2* 29.2*  PLT 40* 48* 52* 43*    COAGS: Recent Labs    02/13/18 2207 04/17/18 1233  INR 1.41 1.54  APTT  --  37*    BMP: Recent Labs    04/21/18 0347 04/24/18 0515 04/26/18 1434 05/01/18 0608 05/16/18 1016  NA 136 135  --  136 139  K 3.7 3.9  --  3.7 4.4  CL 115* 108  --    109 109  CO2 20* 23  --  24 23  GLUCOSE 104* 92  --  94 96  BUN 11 <5*  --  12 10  CALCIUM 7.6* 7.8*  --  8.0* 8.5*  CREATININE 0.56* 0.50* 0.66 0.48* 0.71  GFRNONAA >60 >60 >60 >60 >60  GFRAA >60 >60 >60 >60 >60    LIVER FUNCTION TESTS: Recent Labs    02/13/18 2023 04/17/18 1233 05/01/18 0608 05/16/18 1016  BILITOT 2.1* 1.5* 1.4* 1.6*  AST 39 36 48* 66*  ALT _0 ALKPHOS 142* 142* 138* 131*  PROT 7.4 7.3 7.4 8.6*  ALBUMIN 1.9* 1.8* 1.8* 2.3*    TUMOR MARKERS: No results for input(s): AFPTM, CEA, CA199, CHROMGRNA in the last 8760 hours.  Assessment and Plan:  Andrew Gibbs is a 62 year old male with a new diagnosis of [place].    Using BCLC of 4cm tumor, CP B, ECOG 0, my impression is he is intermediate stage.    I had a lengthy discussion with Andrew Gibbs and his brother about the anatomy, pathophysiology of cirrhosis and HCC, and treatment options for [facility].  I did discuss surgery/transplant as the only curative option, with our options, though very good, as palliative options.    I do think that considering his comorbidities, including the presence of ascites, size and location of the tumor, that ablation is not the best choice.  I think y90 is a very good choice, and I shared my impression.  I also discussed the logistics and thought process behind y90 therapy.  I also discussed a risk benefit analysis regarding treatment, with specific risks to include: bleeding, infection, artery injury, contrast reaction, kidney injury, non-target embolization, GI injury/ulcer, need for  further procedure/surgery including re-treatment, post-embolization syndrome, cardiopulmonary collapse, death.    I also did let them know that we have a fairly intense treatment schedule for y90, which would include CTA of abdomen, angiogram planning study, treatment day, and then several CT studies that occur in the first year after therapy.  I emphasized that often repeat treatments are performed for these types of tumors, for either persisting tumor or for recurrence.    He is motivated to be treated.  I emphasized that he needs to do everything possible for ETOH cessation, smoking cessation, and IV drug use cessation.  He voiced his understanding.    While I do think he is a candidate for treatment, I did let him know that we would need to delay our therapy until after we are confident that his current infections are adequately treated.  He would be at risk for worsening infection if we caused leukopenia or other immune compromise during our therapy.  He understands.    Plan: - Will plan to initiate our planning studies after he has his follow up with the ID team (scheduled now 8 weeks after his 10/31 visit --> late December).  Treatment before this time will leave him susceptible to worsening infection given the risk of leukopenia.  - He is a candidate for y90 therapy of his Intermediate Stage segment 4 HCC.  - Once we can safely proceed will plan on CTA abdomen/pelvis - Once we are proceeding will plan on angiogram for y90 mapping/planning study - I have advised him to follow through on establishing PCP. - I have advised him to observe his physician appointments  Thank you for this interesting consult.  I greatly enjoyed meeting Andrew Gibbs and look forward to participating  in their care.  A copy of this report was sent to the requesting provider on this date.  Electronically Signed: ,  05/22/2018, 2:53 PM   I spent a total of  60 Minutes   in face to face in clinical  consultation, greater than 50% of which was counseling/coordinating care for Intermediate stage HCC, possible y90 treatment.   

## 2018-06-23 NOTE — Progress Notes (Signed)
[place]  Telephone:(336) [phone] [phone]) [phone]  ID: Andrew Gibbs OB: 10-18-1955  MR#: [id number]  RXY#:[id number]  Patient Care Team: Center, [facility] as PCP - General (General Practice) Clent Jacks, RN as Registered Nurse  CHIEF COMPLAINT: Hepatocellular carcinoma.  INTERVAL HISTORY: Patient returns to clinic today for repeat laboratory work and further evaluation.  He was recently evaluated by interventional radiology.  Ablation is possible, but patient requires continued work-up and treatment with infectious disease.  Currently, he appears to be at his baseline. He has chronic weakness and fatigue.  He complains of persistent back pain.  He has no neurologic complaints.  He denies any recent fevers.  He has a fair appetite, but denies weight loss.  He has no chest pain, cough, shortness of breath, or hemoptysis.  He has no nausea, vomiting, constipation, or diarrhea.  He has no urinary complaints.  Patient offers no further specific complaints today.  REVIEW OF SYSTEMS:   Review of Systems  Constitutional: Positive for malaise/fatigue. Negative for fever and weight loss.  Respiratory: Negative.  Negative for cough, hemoptysis and shortness of breath.   Cardiovascular: Negative.  Negative for chest pain and leg swelling.  Gastrointestinal: Negative.  Negative for abdominal pain, constipation and diarrhea.  Genitourinary: Negative for dysuria.  Musculoskeletal: Positive for back pain.  Skin: Negative.  Negative for rash.  Neurological: Positive for weakness. Negative for focal weakness and headaches.  Psychiatric/Behavioral: Positive for substance abuse. The patient is not nervous/anxious.     As per HPI. Otherwise, a complete review of systems is negative.  PAST MEDICAL HISTORY: Past Medical History:  Diagnosis Date  . Blood dyscrasia    thrombocytopenia  . Hepatitis C   . Heroin use     PAST SURGICAL HISTORY: Past Surgical  History:  Procedure Laterality Date  . IR RADIOLOGIST EVAL & MGMT  05/22/2018  . KNEE SURGERY    . TEE WITHOUT CARDIOVERSION N/A 04/22/2018   Procedure: TRANSESOPHAGEAL ECHOCARDIOGRAM (TEE);  Surgeon: Teodoro Spray, MD;  Location: [facility] ORS;  Service: Cardiovascular;  Laterality: N/A;    FAMILY HISTORY: Family History  Problem Relation Age of Onset  . Multiple myeloma Mother     ADVANCED DIRECTIVES (Y/N):  N  HEALTH MAINTENANCE: Social History   Tobacco Use  . Smoking status: Current Every Day Smoker  . Smokeless tobacco: Never Used  Substance Use Topics  . Alcohol use: Yes    Comment: 40 oz today  . Drug use: Yes    Types: IV, Cocaine, Heroin    Comment: heroin, states he used to use cocaine     Colonoscopy:  PAP:  Bone density:  Lipid panel:  No Known Allergies  Current Outpatient Medications  Medication Sig Dispense Refill  . fluconazole (DIFLUCAN) 200 MG tablet Take 600 mg by mouth daily.    . Multiple Vitamin (MULTIVITAMIN WITH MINERALS) TABS tablet Take 1 tablet by mouth daily. 30 tablet 0   No current facility-administered medications for this visit.     OBJECTIVE: Vitals:   06/25/18 1039  BP: (!) 155/77  Pulse: 64  Resp: 18  Temp: 98 F (36.7 C)     Body mass index is 19.73 kg/m.    ECOG FS:0 - Asymptomatic  General: Thin, disheveled appearing, no acute distress. Eyes: Pink conjunctiva, anicteric sclera. HEENT: Normocephalic, moist mucous membranes, clear oropharnyx. Lungs: Clear to auscultation bilaterally. Heart: Regular rate and rhythm. No rubs, murmurs, or gallops. Abdomen: Soft, nontender, nondistended. No organomegaly  noted, normoactive bowel sounds. Musculoskeletal: No edema, cyanosis, or clubbing. Neuro: Alert, answering all questions appropriately. Cranial nerves grossly intact. Skin: No rashes or petechiae noted. Psych: Flat affect.  LAB RESULTS:  Lab Results  Component Value Date   NA 140 06/25/2018   K 3.3 (L) 06/25/2018   CL  108 06/25/2018   CO2 27 06/25/2018   GLUCOSE 118 (H) 06/25/2018   BUN 10 06/25/2018   CREATININE 0.71 06/25/2018   CALCIUM 9.2 06/25/2018   PROT 8.3 (H) 06/25/2018   ALBUMIN 2.5 (L) 06/25/2018   AST 64 (H) 06/25/2018   ALT 20 06/25/2018   ALKPHOS 158 (H) 06/25/2018   BILITOT 1.8 (H) 06/25/2018   GFRNONAA >60 06/25/2018   GFRAA >60 06/25/2018    Lab Results  Component Value Date   WBC 4.6 06/25/2018   NEUTROABS 3.0 06/25/2018   HGB 10.0 (L) 06/25/2018   HCT 30.7 (L) 06/25/2018   MCV 101.0 (H) 06/25/2018   PLT 64 (L) 06/25/2018     STUDIES: No results found.  ASSESSMENT: Hepatocellular carcinoma:  PLAN:    1.  Hepatocellular carcinoma: MRI results from April 20, 2018 reviewed independently with 4 cm mass in patient's liver.  Appreciate interventional radiology input.  Ablation is possible, but needs further work-up and evaluation from infectious disease given his active hepatitis B and hepatitis C.  AFP continues to increase significantly and is now 7196. Patient will likely not tolerate systemic chemotherapy if necessary.  Return to clinic in 6 weeks for further evaluation. 2.  Thrombocytopenia: Patient's most recent platelet count is 64 which is slightly greater than his baseline.  Likely secondary to cirrhosis.  Patient's spleen measures 14.4 cm which is the upper limit of normal.  Continue to monitor. 3.  Anemia: Chronic and unchanged. 4.  Sepsis/hepatitis B/hepatitis C: Continue follow-up, treatment, and evaluation per infectious disease.   Patient expressed understanding and was in agreement with this plan. He also understands that He can call clinic at any time with any questions, concerns, or complaints.   Cancer Staging Hepatocellular carcinoma [facility]) Staging form: Liver, AJCC 8th Edition - Clinical stage from 05/17/2018: Stage IB (cT1b, cN0, cM0) - Signed by Lloyd Huger, MD on 05/17/2018   Lloyd Huger, MD   06/27/2018 3:01 PM

## 2018-06-25 ENCOUNTER — Inpatient Hospital Stay (HOSPITAL_BASED_OUTPATIENT_CLINIC_OR_DEPARTMENT_OTHER): Payer: Medicare Other | Admitting: Oncology

## 2018-06-25 ENCOUNTER — Other Ambulatory Visit: Payer: Self-pay

## 2018-06-25 ENCOUNTER — Inpatient Hospital Stay: Payer: Medicare Other | Attending: Oncology

## 2018-06-25 VITALS — BP 155/77 | HR 64 | Temp 98.0°F | Resp 18 | Wt 126.0 lb

## 2018-06-25 DIAGNOSIS — D649 Anemia, unspecified: Secondary | ICD-10-CM | POA: Diagnosis not present

## 2018-06-25 DIAGNOSIS — R5382 Chronic fatigue, unspecified: Secondary | ICD-10-CM

## 2018-06-25 DIAGNOSIS — D696 Thrombocytopenia, unspecified: Secondary | ICD-10-CM

## 2018-06-25 DIAGNOSIS — F1910 Other psychoactive substance abuse, uncomplicated: Secondary | ICD-10-CM | POA: Insufficient documentation

## 2018-06-25 DIAGNOSIS — Z720 Tobacco use: Secondary | ICD-10-CM

## 2018-06-25 DIAGNOSIS — B1920 Unspecified viral hepatitis C without hepatic coma: Secondary | ICD-10-CM

## 2018-06-25 DIAGNOSIS — C220 Liver cell carcinoma: Secondary | ICD-10-CM

## 2018-06-25 DIAGNOSIS — M545 Low back pain: Secondary | ICD-10-CM | POA: Diagnosis not present

## 2018-06-25 DIAGNOSIS — B1910 Unspecified viral hepatitis B without hepatic coma: Secondary | ICD-10-CM

## 2018-06-25 DIAGNOSIS — A419 Sepsis, unspecified organism: Secondary | ICD-10-CM | POA: Diagnosis not present

## 2018-06-25 LAB — CBC WITH DIFFERENTIAL/PLATELET
ABS IMMATURE GRANULOCYTES: 0.02 10*3/uL (ref 0.00–0.07)
Basophils Absolute: 0 10*3/uL (ref 0.0–0.1)
Basophils Relative: 0 %
Eosinophils Absolute: 0 10*3/uL (ref 0.0–0.5)
Eosinophils Relative: 1 %
HCT: 30.7 % — ABNORMAL LOW (ref 39.0–52.0)
Hemoglobin: 10 g/dL — ABNORMAL LOW (ref 13.0–17.0)
Immature Granulocytes: 0 %
Lymphocytes Relative: 25 %
Lymphs Abs: 1.1 10*3/uL (ref 0.7–4.0)
MCH: 32.9 pg (ref 26.0–34.0)
MCHC: 32.6 g/dL (ref 30.0–36.0)
MCV: 101 fL — ABNORMAL HIGH (ref 80.0–100.0)
MONOS PCT: 8 %
Monocytes Absolute: 0.4 10*3/uL (ref 0.1–1.0)
NEUTROS ABS: 3 10*3/uL (ref 1.7–7.7)
Neutrophils Relative %: 66 %
Platelets: 64 10*3/uL — ABNORMAL LOW (ref 150–400)
RBC: 3.04 MIL/uL — ABNORMAL LOW (ref 4.22–5.81)
RDW: 16.3 % — ABNORMAL HIGH (ref 11.5–15.5)
WBC: 4.6 10*3/uL (ref 4.0–10.5)
nRBC: 0 % (ref 0.0–0.2)

## 2018-06-25 LAB — RETICULOCYTES
Immature Retic Fract: 11.8 % (ref 2.3–15.9)
RBC.: 3.04 MIL/uL — ABNORMAL LOW (ref 4.22–5.81)
RETIC COUNT ABSOLUTE: 53.2 10*3/uL (ref 19.0–186.0)
Retic Ct Pct: 1.8 % (ref 0.4–3.1)

## 2018-06-25 LAB — COMPREHENSIVE METABOLIC PANEL
ALT: 20 U/L (ref 0–44)
AST: 64 U/L — ABNORMAL HIGH (ref 15–41)
Albumin: 2.5 g/dL — ABNORMAL LOW (ref 3.5–5.0)
Alkaline Phosphatase: 158 U/L — ABNORMAL HIGH (ref 38–126)
Anion gap: 5 (ref 5–15)
BUN: 10 mg/dL (ref 8–23)
CO2: 27 mmol/L (ref 22–32)
Calcium: 9.2 mg/dL (ref 8.9–10.3)
Chloride: 108 mmol/L (ref 98–111)
Creatinine, Ser: 0.71 mg/dL (ref 0.61–1.24)
GFR calc Af Amer: >60 mL/min (ref >60)
GFR calc non Af Amer: >60 mL/min (ref >60)
Glucose, Bld: 118 mg/dL — ABNORMAL HIGH (ref 70–99)
Potassium: 3.3 mmol/L — ABNORMAL LOW (ref 3.5–5.1)
SODIUM: 140 mmol/L (ref 135–145)
Total Bilirubin: 1.8 mg/dL — ABNORMAL HIGH (ref 0.3–1.2)
Total Protein: 8.3 g/dL — ABNORMAL HIGH (ref 6.5–8.1)

## 2018-06-25 LAB — PROTIME-INR
INR: 1.42
Prothrombin Time: 17.2 seconds — ABNORMAL HIGH (ref 11.4–15.2)

## 2018-06-25 LAB — FOLATE: Folate: 27 ng/mL (ref >5.9)

## 2018-06-25 LAB — IRON AND TIBC
Iron: 111 ug/dL (ref 45–182)
SATURATION RATIOS: 39 % (ref 17.9–39.5)
TIBC: 285 ug/dL (ref 250–450)
UIBC: 174 ug/dL

## 2018-06-25 LAB — FERRITIN: Ferritin: 279 ng/mL (ref 24–336)

## 2018-06-25 LAB — TSH: TSH: 1.056 u[IU]/mL (ref 0.350–4.500)

## 2018-06-25 LAB — VITAMIN B12: Vitamin B-12: 877 pg/mL (ref 180–914)

## 2018-06-25 NOTE — Progress Notes (Signed)
Patient here today for follow up, denies concerns.

## 2018-06-26 LAB — HAPTOGLOBIN: Haptoglobin: <10 mg/dL — ABNORMAL LOW (ref 34–200)

## 2018-06-26 LAB — AFP TUMOR MARKER: AFP, Serum, Tumor Marker: 7196 ng/mL — ABNORMAL HIGH (ref 0.0–8.3)

## 2018-07-01 LAB — MULTIPLE MYELOMA PANEL, SERUM
ALPHA2 GLOB SERPL ELPH-MCNC: 0.5 g/dL (ref 0.4–1.0)
Albumin SerPl Elph-Mcnc: 2.8 g/dL — ABNORMAL LOW (ref 2.9–4.4)
Albumin/Glob SerPl: 0.6 — ABNORMAL LOW (ref 0.7–1.7)
Alpha 1: 0.2 g/dL (ref 0.0–0.4)
B-GLOBULIN SERPL ELPH-MCNC: 1.3 g/dL (ref 0.7–1.3)
GAMMA GLOB SERPL ELPH-MCNC: 3.1 g/dL — AB (ref 0.4–1.8)
GLOBULIN, TOTAL: 5.2 g/dL — AB (ref 2.2–3.9)
IGG (IMMUNOGLOBIN G), SERUM: 3045 mg/dL — AB (ref 700–1600)
IgA: 1373 mg/dL — ABNORMAL HIGH (ref 61–437)
IgM (Immunoglobulin M), Srm: 373 mg/dL — ABNORMAL HIGH (ref 20–172)
TOTAL PROTEIN ELP: 8 g/dL (ref 6.0–8.5)

## 2018-07-23 ENCOUNTER — Ambulatory Visit: Payer: Medicare Other | Attending: Infectious Diseases | Admitting: Infectious Diseases

## 2018-07-23 ENCOUNTER — Encounter: Payer: Self-pay | Admitting: Infectious Diseases

## 2018-07-23 VITALS — BP 144/87 | HR 75 | Temp 98.2°F | Wt 140.0 lb

## 2018-07-23 DIAGNOSIS — C220 Liver cell carcinoma: Secondary | ICD-10-CM

## 2018-07-23 DIAGNOSIS — M4623 Osteomyelitis of vertebra, cervicothoracic region: Secondary | ICD-10-CM

## 2018-07-23 DIAGNOSIS — R7881 Bacteremia: Secondary | ICD-10-CM

## 2018-07-23 DIAGNOSIS — B1920 Unspecified viral hepatitis C without hepatic coma: Secondary | ICD-10-CM

## 2018-07-23 DIAGNOSIS — B49 Unspecified mycosis: Secondary | ICD-10-CM | POA: Diagnosis not present

## 2018-07-23 DIAGNOSIS — I358 Other nonrheumatic aortic valve disorders: Secondary | ICD-10-CM

## 2018-07-23 DIAGNOSIS — R040 Epistaxis: Secondary | ICD-10-CM

## 2018-07-23 DIAGNOSIS — D649 Anemia, unspecified: Secondary | ICD-10-CM

## 2018-07-23 DIAGNOSIS — B377 Candidal sepsis: Secondary | ICD-10-CM | POA: Insufficient documentation

## 2018-07-23 DIAGNOSIS — F17200 Nicotine dependence, unspecified, uncomplicated: Secondary | ICD-10-CM

## 2018-07-23 DIAGNOSIS — B9689 Other specified bacterial agents as the cause of diseases classified elsewhere: Secondary | ICD-10-CM

## 2018-07-23 DIAGNOSIS — M4643 Discitis, unspecified, cervicothoracic region: Secondary | ICD-10-CM

## 2018-07-23 DIAGNOSIS — M4642 Discitis, unspecified, cervical region: Secondary | ICD-10-CM | POA: Insufficient documentation

## 2018-07-23 DIAGNOSIS — Z792 Long term (current) use of antibiotics: Secondary | ICD-10-CM

## 2018-07-23 NOTE — Progress Notes (Signed)
NAME: Andrew Gibbs  DOB: 1956-01-12  MRN: [id number]  Date/Time: 07/23/2018 9:38 AM Subjective:  Follow up visit ?Sister is with him today?  Andrew Gibbs is a 63 y.o. male with a history  of hepatitis C and EX IV heroin use, candidemia, cervical discitis, aortic valve endocarditis is here for follow up Doing well, no neck pain, no fever, has gained 20 pounds, clean since Oct 2019. Eating well, No side effects from meds like rash or diarrhea.He says he is 100% adherent C/o epistaxis in the morning when he blows his nose. Also has to get up multiple times at night for urination. No dysuria or abdominal pain Also has hepatocellular carcinoma and is followed by Dr.Finnegan  Medical history Was admitted to [facility]  was admitted with fever and chills and also pain and swelling of the right upper arm.and back pain. Patient had Candida lusitaniae  fungemia, stenotrophomonas in the blood  flavobacterium in blood  and Serratia in wound culture. HE had MRI of the spine and it revealed c7-T1 discitis and TEE done on 10/6 showed  a small,irregular, solid, mobile vegetation on the left ventricularaspect of the left coronary cusp; the appearance is consistent with vegetation could not exclude aortic valve vegetation) After IV echinocandin for 2 weeks he was discharged home on PO fluconazole 64m/kg-600mg once a day and Po levaquin. He finished PO levaquin on 06/16/18 after 6 weeks. He is taking fluconazole 6062ma day   Past Medical History:  Diagnosis Date  . Blood dyscrasia    thrombocytopenia  . Hepatitis C   . Heroin use     Past Surgical History:  Procedure Laterality Date  . IR RADIOLOGIST EVAL & MGMT  05/22/2018  . KNEE SURGERY    . TEE WITHOUT CARDIOVERSION N/A 04/22/2018   Procedure: TRANSESOPHAGEAL ECHOCARDIOGRAM (TEE);  Surgeon: FaTeodoro SprayMD;  Location: [facility] ORS;  Service: Cardiovascular;  Laterality: N/A;    Social History   Socioeconomic History  . Marital  status: Widowed    Spouse name: Not on file  . Number of children: Not on file  . Years of education: Not on file  . Highest education level: Not on file  Occupational History  . Not on file  Social Needs  . Financial resource strain: Not on file  . Food insecurity:    Worry: Not on file    Inability: Not on file  . Transportation needs:    Medical: Not on file    Non-medical: Not on file  Tobacco Use  . Smoking status: Current Every Day Smoker  . Smokeless tobacco: Never Used  Substance and Sexual Activity  . Alcohol use: Yes    Comment: 40 oz today  . Drug use: Yes    Types: IV, Cocaine, Heroin    Comment: heroin, states he used to use cocaine  . Sexual activity: Not on file  Lifestyle  . Physical activity:    Days per week: Not on file    Minutes per session: Not on file  . Stress: Not on file  Relationships  . Social connections:    Talks on phone: Not on file    Gets together: Not on file    Attends religious service: Not on file    Active member of club or organization: Not on file    Attends meetings of clubs or organizations: Not on file    Relationship status: Not on file  . Intimate partner violence:    Fear  of current or ex partner: Not on file    Emotionally abused: Not on file    Physically abused: Not on file    Forced sexual activity: Not on file  Other Topics Concern  . Not on file  Social History Narrative  . Not on file    Family History  Problem Relation Age of Onset  . Multiple myeloma Mother    No Known Allergies  ? Current Outpatient Medications  Medication Sig Dispense Refill  . fluconazole (DIFLUCAN) 200 MG tablet Take 600 mg by mouth daily.    . Multiple Vitamin (MULTIVITAMIN WITH MINERALS) TABS tablet Take 1 tablet by mouth daily. 30 tablet 0   No current facility-administered medications for this visit.      Abtx:  Anti-infectives (From admission, onward)   None      REVIEW OF SYSTEMS:  Const: negative fever, negative  chills, weight gain Eyes: negative diplopia or visual changes, negative eye pain ENT: epistaxis Resp: negative cough, hemoptysis, dyspnea Cards: negative for chest pain, palpitations, lower extremity edema GU:as above GI: Negative for abdominal pain, diarrhea, bleeding, constipation Skin: negative for rash and pruritus Heme: negative for easy bruising and gum/nose bleeding MS: negative for myalgias, arthralgias, back pain and muscle weakness Neurolo:negative for headaches, dizziness, vertigo, memory problems  Psych: negative for feelings of anxiety, depression  Endocrine: negative for thyroid, diabetes Allergy/Immunology- negative for any medication or food allergies ?  Objective:  VITALS:  BP (!) 144/87 (BP Location: Left Arm, Patient Position: Sitting, Cuff Size: Normal)   Pulse 75   Temp 98.2 F (36.8 C) (Oral)   Wt 140 lb (63.5 kg)   BMI 21.93 kg/m  PHYSICAL EXAM:  General: Alert, cooperative, no distress, appears stated age.  Head: Normocephalic, without obvious abnormality, atraumatic. Eyes: Conjunctivae clear, anicteric sclerae. Pupils are equal ENT Nares normal. No drainage or sinus tenderness. Lips, mucosa, and tongue normal. No Thrush Neck: Supple, symmetrical, no adenopathy, thyroid: non tender no carotid bruit and no JVD. Back: No CVA tenderness. Lungs: Clear to auscultation bilaterally. No Wheezing or Rhonchi. No rales. Heart: Regular rate and rhythm, no murmur, rub or gallop. Abdomen: Soft, non-tender,not distended. Bowel sounds normal. No masses Extremities: atraumatic, no cyanosis. No edema. No clubbing Skin: No rashes or lesions. Or bruising Lymph: Cervical, supraclavicular normal. Neurologic: Grossly non-focal Pertinent Labs Lab Results CBC    Component Value Date/Time   WBC 4.6 06/25/2018 0951   RBC 3.04 (L) 06/25/2018 0951   RBC 3.04 (L) 06/25/2018 0951   HGB 10.0 (L) 06/25/2018 0951   HGB 14.5 01/21/2014 1700   HCT 30.7 (L) 06/25/2018 0951    HCT 42.7 01/21/2014 1700   PLT 64 (L) 06/25/2018 0951   PLT 148 (L) 01/21/2014 1700   MCV 101.0 (H) 06/25/2018 0951   MCV 102 (H) 01/21/2014 1700   MCH 32.9 06/25/2018 0951   MCHC 32.6 06/25/2018 0951   RDW 16.3 (H) 06/25/2018 0951   RDW 14.9 (H) 01/21/2014 1700   LYMPHSABS 1.1 06/25/2018 0951   LYMPHSABS 2.0 03/27/2013 0539   MONOABS 0.4 06/25/2018 0951   MONOABS 0.4 03/27/2013 0539   EOSABS 0.0 06/25/2018 0951   EOSABS 0.1 03/27/2013 0539   BASOSABS 0.0 06/25/2018 0951   BASOSABS 0.0 03/27/2013 0539    CMP Latest Ref Rng & Units 06/25/2018 05/16/2018 05/01/2018  Glucose 70 - 99 mg/dL 118(H) 96 94  BUN 8 - 23 mg/dL _0 Creatinine 0.61 - 1.24 mg/dL 0.71 0.71 0.48(L)  Sodium  135 - 145 mmol/L 140 139 136  Potassium 3.5 - 5.1 mmol/L 3.3(L) 4.4 3.7  Chloride 98 - 111 mmol/L 108 109 109  CO2 22 - 32 mmol/L _0 Calcium 8.9 - 10.3 mg/dL 9.2 8.5(L) 8.0(L)  Total Protein 6.5 - 8.1 g/dL 8.3(H) 8.6(H) 7.4  Total Bilirubin 0.3 - 1.2 mg/dL 1.8(H) 1.6(H) 1.4(H)  Alkaline Phos 38 - 126 U/L 158(H) 131(H) 138(H)  AST 15 - 41 U/L 64(H) 66(H) 48(H)  ALT 0 - 44 U/L _1 Microbiology: No results found for this or any previous visit (from the past 240 hour(s)). IMAGING RESULTS: ? Impression/Recommendation  Candida lusitaniae fungemia- susceptibile tofluconazole.MIC < 0.5  now on  fluconazole 68m/Kg 6085m  May  continue indefinitely- will reduce the dose to 40030mtenotrophomonas bacteremia -treated --with levaquin for 6 weeks until 06/16/18 ( for endocarditis and discitis)  c7-t1 discitis/osteo Neck painBack pain.resolved In July the MRI of the spine was normal. In October MRI cervical spine showed  - - He will continue fluconazole for another year or indefinitely depending on his comorbidities and treatment   possible aortic valve endocarditis ( TEE done on 10/6/19Aortic valve: Cannot exclude vegetation. 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 All the lesions have cleared --serratia /MSSA in wound culture-treated  Epistaxis- used to snort drugs before- no obvious perforation- use nasal saline sprays -if no improvement ENT referral  Anemia: multifactorial  Hepatitis C has not been treated.  will have to see GI /hepatology to discuss treatment.Has liver cancer with high AFP- he is seeing oncologist today-  HIV  negative   Discussed the management with patient andhis sister  Will see him back in 4 months ? ?Discussed with Dr.Finnegan, Dr.Anna ( hepatology/GI) Pt has an appt with GI this week Will need the following labs- fungitell, ESR, CBC with diff and CMP( he will get it done with onc as he does not want to be stuck multiple times

## 2018-07-23 NOTE — Patient Instructions (Addendum)
You are here for follow up of the recent infection of your spine and heart valve with a fungus called candida lusitaniae- You are on fluconazole and doing well- We can reduce the fluconazole to 412m ( 2 tablets) instead of3- You will need to take this for atleast a year. You have hepatitis C and liver cancer- you will have to see the GI/Liver doctor for treatment and the cancer doctor as well. Dr.Anna  [phone](V) [phone]([facility] Not available [id number]  For the bleeding from the nose in the mornings use nasal saline spray and if still not better will need to see a Nose specialist PMargaretha SheffieldMD  [phone] available [address]   [address]   [id number]  You will need blood test test -ESR, beta D glucan, CMP, CBC

## 2018-07-25 ENCOUNTER — Ambulatory Visit: Payer: Medicare Other | Admitting: Gastroenterology

## 2018-07-29 ENCOUNTER — Encounter: Payer: Self-pay | Admitting: Gastroenterology

## 2018-07-29 ENCOUNTER — Telehealth: Payer: Self-pay

## 2018-07-29 ENCOUNTER — Ambulatory Visit (INDEPENDENT_AMBULATORY_CARE_PROVIDER_SITE_OTHER): Payer: Medicare Other | Admitting: Gastroenterology

## 2018-07-29 VITALS — BP 156/82 | HR 65 | Ht 67.0 in | Wt 136.6 lb

## 2018-07-29 DIAGNOSIS — B1920 Unspecified viral hepatitis C without hepatic coma: Secondary | ICD-10-CM | POA: Diagnosis not present

## 2018-07-29 DIAGNOSIS — R188 Other ascites: Secondary | ICD-10-CM

## 2018-07-29 DIAGNOSIS — K7460 Unspecified cirrhosis of liver: Secondary | ICD-10-CM | POA: Diagnosis not present

## 2018-07-29 MED ORDER — FUROSEMIDE 20 MG PO TABS: 20.0000 mg | ORAL_TABLET | Freq: Every day | ORAL | 1 refills | Status: DC

## 2018-07-29 MED ORDER — SPIRONOLACTONE 50 MG PO TABS: 50.0000 mg | ORAL_TABLET | Freq: Every day | ORAL | 0 refills | Status: DC

## 2018-07-29 NOTE — Progress Notes (Signed)
Jonathon Bellows MD, MRCP(U.K) [address]  [place]  [place], [place] [address]  Main: [phone]  Fax: [phone]   Gastroenterology Consultation  Referring Provider:     Center, [place] Physician:  Center, [facility] Primary Gastroenterologist:  Dr. Jonathon Bellows  Reason for Consultation:     Hepatitis C        HPI:   Andrew Gibbs is a 63 y.o. y/o male referred for consultation & management  by Dr. Domingo Madeira, [facility]    He has been referred for evaluation of hepatiits C. He sees Dr Grayland Ormond in Oncology for hepatocellular carcinoma . 4 cm mass . INR 1.42, Cr 0.71 ,T bil 1.8 , albumin 2.5 .  Last MRI shows ascites, b/l pleural effusions , paraesophageal varices.   Last MRI shows features of portal hypertension. H/o active IV drug use. Says he has had hepatitis C since 1976 and never been treated. Says he quit illegal drug use with heroine in 04/2018. Drank a lot of alcohol in the past that he quit in 04/2018 - says he was drinkng to the point that he would pass out.   No tatoos, no travel outside the country , no incarceration .   Past Medical History:  Diagnosis Date  . Blood dyscrasia    thrombocytopenia  . Hepatitis C   . Heroin use     Past Surgical History:  Procedure Laterality Date  . IR RADIOLOGIST EVAL & MGMT  05/22/2018  . KNEE SURGERY    . TEE WITHOUT CARDIOVERSION N/A 04/22/2018   Procedure: TRANSESOPHAGEAL ECHOCARDIOGRAM (TEE);  Surgeon: Teodoro Spray, MD;  Location: [facility] ORS;  Service: Cardiovascular;  Laterality: N/A;    Prior to Admission medications   Medication Sig Start Date End Date Taking? Authorizing Provider  fluconazole (DIFLUCAN) 200 MG tablet Take 600 mg by mouth daily.   Yes [provider]  Multiple Vitamin (MULTIVITAMIN WITH MINERALS) TABS tablet Take 1 tablet by mouth daily. 05/03/18  Yes Epifanio Lesches, MD    Family History  Problem Relation Age of Onset  . Multiple  myeloma Mother      Social History   Tobacco Use  . Smoking status: Current Every Day Smoker  . Smokeless tobacco: Never Used  Substance Use Topics  . Alcohol use: Yes    Comment: 40 oz today  . Drug use: Yes    Types: IV, Cocaine, Heroin    Comment: heroin, states he used to use cocaine    Allergies as of 07/29/2018  . (No Known Allergies)    Review of Systems:    All systems reviewed and negative except where noted in HPI.   Physical Exam:  There were no vitals taken for this visit. No LMP for male patient. Psych:  Alert and cooperative. Normal mood and affect. General:   Alert,  Well-developed, well-nourished, pleasant and cooperative in NAD Head:  Normocephalic and atraumatic. Eyes:  Sclera clear, no icterus.   Conjunctiva pink. Ears:  Normal auditory acuity. Nose:  No deformity, discharge, or lesions. Mouth:  No deformity or lesions,oropharynx pink & moist. Neck:  Supple; no masses or thyromegaly. Lungs:  Respirations even and unlabored.  Clear throughout to auscultation.   No wheezes, crackles, or rhonchi. No acute distress. Heart:  Regular rate and rhythm; no murmurs, clicks, rubs, or gallops. Abdomen:  Normal bowel sounds.  No bruits.  Soft, non-tender and non-distended without masses, hepatosplenomegaly or hernias noted.  No guarding or  rebound tenderness.    Msk:  Symmetrical without gross deformities. Good, equal movement & strength bilaterally. Pulses:  Normal pulses noted. Extremities:  No clubbing or edema.  No cyanosis. Neurologic:  Alert and oriented x3;  grossly normal neurologically. Skin:  Intact without significant lesions or rashes. No jaundice. Lymph Nodes:  No significant cervical adenopathy. Psych:  Alert and cooperative. Normal mood and affect.  Imaging Studies: No results found.  Assessment and Plan:   Andrew Gibbs is a 63 y.o. y/o male has been referred for hepatitis C. I do not see any labs suggesting he has hepatitis C . MELD 13. Quit  all  alcohol and drug use in 04/2018 . He does not mention of any discussion regarding transplant evaluation for a single lesion seen on MRI.  He has decompensated liver cirrhosis with ascites, If he does have hepatitis C he would need evaluation at a transplant center for 1) [place] and 2) treatment of hepatiits C which can be done either before transplant or after. IF he is not a candidate for transplant then treatment for hepatitis C should also be considered at a tertiary center as he has decompensated status and these patient are at high risk for deterioration when treated for hepatitis C.   Plan  1. Start on aldactone 50 mg and lasix 20 mg - recheck BMP in a week  2. Check Hepatitis B,C,HIV serology  Follow up in 2 weeks   Dr Jonathon Bellows MD,MRCP(U.K)

## 2018-07-29 NOTE — Telephone Encounter (Signed)
Called and spoke with Jocelyn Lamer at [facility]. Dr. Earleen Newport is arranging for Mr. Harrington to return early February.

## 2018-07-30 LAB — COMPREHENSIVE METABOLIC PANEL
ALK PHOS: 185 IU/L — AB (ref 39–117)
ALT: 35 IU/L (ref 0–44)
AST: 160 IU/L — AB (ref 0–40)
Albumin/Globulin Ratio: 0.6 — ABNORMAL LOW (ref 1.2–2.2)
Albumin: 3 g/dL — ABNORMAL LOW (ref 3.6–4.8)
BUN/Creatinine Ratio: 10 (ref 10–24)
BUN: 7 mg/dL — ABNORMAL LOW (ref 8–27)
Bilirubin Total: 1.4 mg/dL — ABNORMAL HIGH (ref 0.0–1.2)
CO2: 23 mmol/L (ref 20–29)
Calcium: 9.2 mg/dL (ref 8.6–10.2)
Chloride: 106 mmol/L (ref 96–106)
Creatinine, Ser: 0.71 mg/dL — ABNORMAL LOW (ref 0.76–1.27)
GFR calc Af Amer: 116 mL/min/{1.73_m2} (ref >59)
GFR calc non Af Amer: 101 mL/min/{1.73_m2} (ref >59)
Globulin, Total: 5.2 g/dL — ABNORMAL HIGH (ref 1.5–4.5)
Glucose: 101 mg/dL — ABNORMAL HIGH (ref 65–99)
Potassium: 3 mmol/L — ABNORMAL LOW (ref 3.5–5.2)
Sodium: 142 mmol/L (ref 134–144)
Total Protein: 8.2 g/dL (ref 6.0–8.5)

## 2018-07-30 LAB — PROTIME-INR
INR: 1.3 — ABNORMAL HIGH (ref 0.8–1.2)
PROTHROMBIN TIME: 13.3 s — AB (ref 9.1–12.0)

## 2018-08-04 NOTE — Progress Notes (Signed)
[place]  Telephone:([phone] [phone]) [phone]  ID: Andrew Gibbs OB: August 08, 1955  MR#: [id number]  UXN#:[id number]  Patient Care Team: Center, [facility] as PCP - General (General Practice) Clent Jacks, RN as Registered Nurse  CHIEF COMPLAINT: Hepatocellular carcinoma.  INTERVAL HISTORY: Patient returns to clinic today for repeat laboratory work and further evaluation.  His performance status slowly is improving.  He is also recently evaluated by GI who thought transplant may be an option.  His weakness and fatigue have improved.  He has no neurologic complaints.  He denies any recent fevers.  He has a fair appetite, but denies weight loss.  He has no chest pain, cough, shortness of breath, or hemoptysis.  He has no nausea, vomiting, constipation, or diarrhea.  He has no urinary complaints.  Patient offers no further specific complaints today.  REVIEW OF SYSTEMS:   Review of Systems  Constitutional: Positive for malaise/fatigue. Negative for fever and weight loss.  Respiratory: Negative.  Negative for cough, hemoptysis and shortness of breath.   Cardiovascular: Negative.  Negative for chest pain and leg swelling.  Gastrointestinal: Negative.  Negative for abdominal pain, constipation and diarrhea.  Genitourinary: Negative for dysuria.  Musculoskeletal: Positive for back pain.  Skin: Negative.  Negative for rash.  Neurological: Negative.  Negative for focal weakness, weakness and headaches.  Psychiatric/Behavioral: Negative.  Negative for substance abuse. The patient is not nervous/anxious.     As per HPI. Otherwise, a complete review of systems is negative.  PAST MEDICAL HISTORY: Past Medical History:  Diagnosis Date  . Blood dyscrasia    thrombocytopenia  . Hepatitis C   . Heroin use     PAST SURGICAL HISTORY: Past Surgical History:  Procedure Laterality Date  . IR RADIOLOGIST EVAL & MGMT  05/22/2018  . KNEE SURGERY    . TEE  WITHOUT CARDIOVERSION N/A 04/22/2018   Procedure: TRANSESOPHAGEAL ECHOCARDIOGRAM (TEE);  Surgeon: Teodoro Spray, MD;  Location: [facility] ORS;  Service: Cardiovascular;  Laterality: N/A;    FAMILY HISTORY: Family History  Problem Relation Age of Onset  . Multiple myeloma Mother     ADVANCED DIRECTIVES (Y/N):  N  HEALTH MAINTENANCE: Social History   Tobacco Use  . Smoking status: Current Every Day Smoker  . Smokeless tobacco: Never Used  Substance Use Topics  . Alcohol use: Yes    Comment: 40 oz today  . Drug use: Yes    Types: IV, Cocaine, Heroin    Comment: heroin, states he used to use cocaine     Colonoscopy:  PAP:  Bone density:  Lipid panel:  No Known Allergies  Current Outpatient Medications  Medication Sig Dispense Refill  . fluconazole (DIFLUCAN) 200 MG tablet Take 600 mg by mouth daily.    . furosemide (LASIX) 20 MG tablet Take 1 tablet (20 mg total) by mouth daily. 90 tablet 1  . Multiple Vitamin (MULTIVITAMIN WITH MINERALS) TABS tablet Take 1 tablet by mouth daily. 30 tablet 0  . spironolactone (ALDACTONE) 50 MG tablet Take 1 tablet (50 mg total) by mouth daily. 60 tablet 0   No current facility-administered medications for this visit.     OBJECTIVE: Vitals:   08/06/18 1133  BP: 129/71  Pulse: 64  Temp: 98.7 F (37.1 C)     Body mass index is 21.41 kg/m.    ECOG FS:0 - Asymptomatic  General: Thin, no acute distress. Eyes: Pink conjunctiva, anicteric sclera. HEENT: Normocephalic, moist mucous membranes, clear oropharnyx. Lungs: Clear  to auscultation bilaterally. Heart: Regular rate and rhythm. No rubs, murmurs, or gallops. Abdomen: Soft, nontender, nondistended. No organomegaly noted, normoactive bowel sounds. Musculoskeletal: No edema, cyanosis, or clubbing. Neuro: Alert, answering all questions appropriately. Cranial nerves grossly intact. Skin: No rashes or petechiae noted. Psych: Normal affect.  LAB RESULTS:  Lab Results  Component Value  Date   NA 141 08/06/2018   K 3.1 (L) 08/06/2018   CL 106 08/06/2018   CO2 28 08/06/2018   GLUCOSE 172 (H) 08/06/2018   BUN 11 08/06/2018   CREATININE 0.81 08/06/2018   CALCIUM 9.2 08/06/2018   PROT 8.8 (H) 08/06/2018   ALBUMIN 2.7 (L) 08/06/2018   AST 67 (H) 08/06/2018   ALT 21 08/06/2018   ALKPHOS 162 (H) 08/06/2018   BILITOT 1.3 (H) 08/06/2018   GFRNONAA >60 08/06/2018   GFRAA >60 08/06/2018    Lab Results  Component Value Date   WBC 4.7 08/06/2018   NEUTROABS 3.4 08/06/2018   HGB 11.9 (L) 08/06/2018   HCT 36.8 (L) 08/06/2018   MCV 100.0 08/06/2018   PLT 77 (L) 08/06/2018     STUDIES: No results found.  ASSESSMENT: Hepatocellular carcinoma:  PLAN:    1.  Hepatocellular carcinoma: MRI results from April 20, 2018 reviewed independently with 4 cm mass in patient's liver.  Appreciate interventional radiology input.  Ablation is possible, but needs further work-up and treatment from GI given his active hepatitis B and hepatitis C. AFP continues to to increase significantly and now is greater than 11,000.  This is likely secondary to progressive malignancy as well as active infectious disease.  GI consultation suggested the possibility of transplant.  Currently patient has follow-up in 3 months, but may move this up depending on GI input. 2.  Hepatitis B/hepatitis C: Patient recently saw infectious disease on July 23, 2018 who referred him to GI for treatment.  Follow-up with infectious disease is in 4 months.  Patient has follow-up with GI in approximately 2 weeks.  Once he is evaluated by them, will further discuss case and determine best treatment strategy. 3.  Thrombocytopenia: Mildly improved at 77.  Likely secondary to cirrhosis.  Patient's spleen measures 14.4 cm which is the upper limit of normal.  Continue to monitor. 4.  Anemia: Hemoglobin is slowly improving.  Monitor.     Patient expressed understanding and was in agreement with this plan. He also understands  that He can call clinic at any time with any questions, concerns, or complaints.   Cancer Staging Hepatocellular carcinoma [facility]) Staging form: Liver, AJCC 8th Edition - Clinical stage from 05/17/2018: Stage IB (cT1b, cN0, cM0) - Signed by Lloyd Huger, MD on 05/17/2018   Lloyd Huger, MD   08/07/2018 2:48 PM

## 2018-08-05 ENCOUNTER — Other Ambulatory Visit: Payer: Self-pay

## 2018-08-05 ENCOUNTER — Telehealth: Payer: Self-pay | Admitting: Gastroenterology

## 2018-08-05 DIAGNOSIS — K7460 Unspecified cirrhosis of liver: Secondary | ICD-10-CM

## 2018-08-05 DIAGNOSIS — R188 Other ascites: Principal | ICD-10-CM

## 2018-08-05 LAB — HEPATITIS B E ANTIBODY: HEP B E AB: POSITIVE — AB

## 2018-08-05 LAB — HIV ANTIBODY (ROUTINE TESTING W REFLEX): HIV Screen 4th Generation wRfx: NONREACTIVE

## 2018-08-05 LAB — HEPATITIS B E ANTIGEN: Hep B E Ag: NEGATIVE

## 2018-08-05 LAB — HEPATITIS C GENOTYPE

## 2018-08-05 LAB — HEPATITIS C ANTIBODY: Hep C Virus Ab: >11 s/co ratio — ABNORMAL HIGH (ref 0.0–0.9)

## 2018-08-05 LAB — HCV RNA QUANT: Hepatitis C Quantitation: NOT DETECTED IU/mL

## 2018-08-05 LAB — HEPATITIS A ANTIBODY, TOTAL: Hep A Total Ab: POSITIVE — AB

## 2018-08-05 LAB — HEPATITIS B CORE ANTIBODY, TOTAL: Hep B Core Total Ab: POSITIVE — AB

## 2018-08-05 NOTE — Telephone Encounter (Signed)
Andrew Gibbs called & wanted to know if we could have the lab work Dr Vicente Males wanted drawn sent to the cancer center tomorrow 08-06-2018 so he's only stuck once.

## 2018-08-05 NOTE — Telephone Encounter (Signed)
Spoke with pt sister, [name], and explained that the pt is okay to have the labs drawn during his oncology appointment. I explained that pt labs are already ordered.

## 2018-08-06 ENCOUNTER — Telehealth: Payer: Self-pay

## 2018-08-06 ENCOUNTER — Inpatient Hospital Stay: Payer: Medicare Other | Attending: Oncology

## 2018-08-06 ENCOUNTER — Other Ambulatory Visit: Payer: Self-pay

## 2018-08-06 ENCOUNTER — Inpatient Hospital Stay (HOSPITAL_BASED_OUTPATIENT_CLINIC_OR_DEPARTMENT_OTHER): Payer: Medicare Other | Admitting: Oncology

## 2018-08-06 ENCOUNTER — Other Ambulatory Visit: Payer: Self-pay | Admitting: Interventional Radiology

## 2018-08-06 VITALS — BP 129/71 | HR 64 | Temp 98.7°F | Ht 67.0 in | Wt 136.7 lb

## 2018-08-06 DIAGNOSIS — D696 Thrombocytopenia, unspecified: Secondary | ICD-10-CM | POA: Insufficient documentation

## 2018-08-06 DIAGNOSIS — B1920 Unspecified viral hepatitis C without hepatic coma: Secondary | ICD-10-CM | POA: Diagnosis not present

## 2018-08-06 DIAGNOSIS — B1910 Unspecified viral hepatitis B without hepatic coma: Secondary | ICD-10-CM | POA: Insufficient documentation

## 2018-08-06 DIAGNOSIS — B377 Candidal sepsis: Secondary | ICD-10-CM

## 2018-08-06 DIAGNOSIS — Z720 Tobacco use: Secondary | ICD-10-CM

## 2018-08-06 DIAGNOSIS — D649 Anemia, unspecified: Secondary | ICD-10-CM | POA: Insufficient documentation

## 2018-08-06 DIAGNOSIS — C220 Liver cell carcinoma: Secondary | ICD-10-CM | POA: Diagnosis not present

## 2018-08-06 DIAGNOSIS — R160 Hepatomegaly, not elsewhere classified: Secondary | ICD-10-CM

## 2018-08-06 LAB — CBC WITH DIFFERENTIAL/PLATELET
ABS IMMATURE GRANULOCYTES: 0.01 10*3/uL (ref 0.00–0.07)
BASOS PCT: 0 %
Basophils Absolute: 0 10*3/uL (ref 0.0–0.1)
Eosinophils Absolute: 0 10*3/uL (ref 0.0–0.5)
Eosinophils Relative: 1 %
HCT: 36.8 % — ABNORMAL LOW (ref 39.0–52.0)
Hemoglobin: 11.9 g/dL — ABNORMAL LOW (ref 13.0–17.0)
Immature Granulocytes: 0 %
Lymphocytes Relative: 21 %
Lymphs Abs: 1 10*3/uL (ref 0.7–4.0)
MCH: 32.3 pg (ref 26.0–34.0)
MCHC: 32.3 g/dL (ref 30.0–36.0)
MCV: 100 fL (ref 80.0–100.0)
Monocytes Absolute: 0.2 10*3/uL (ref 0.1–1.0)
Monocytes Relative: 5 %
NEUTROS ABS: 3.4 10*3/uL (ref 1.7–7.7)
Neutrophils Relative %: 73 %
Platelets: 77 10*3/uL — ABNORMAL LOW (ref 150–400)
RBC: 3.68 MIL/uL — AB (ref 4.22–5.81)
RDW: 15.9 % — ABNORMAL HIGH (ref 11.5–15.5)
WBC: 4.7 10*3/uL (ref 4.0–10.5)
nRBC: 0 % (ref 0.0–0.2)

## 2018-08-06 LAB — COMPREHENSIVE METABOLIC PANEL
ALT: 21 U/L (ref 0–44)
AST: 67 U/L — AB (ref 15–41)
Albumin: 2.7 g/dL — ABNORMAL LOW (ref 3.5–5.0)
Alkaline Phosphatase: 162 U/L — ABNORMAL HIGH (ref 38–126)
Anion gap: 7 (ref 5–15)
BUN: 11 mg/dL (ref 8–23)
CO2: 28 mmol/L (ref 22–32)
Calcium: 9.2 mg/dL (ref 8.9–10.3)
Chloride: 106 mmol/L (ref 98–111)
Creatinine, Ser: 0.81 mg/dL (ref 0.61–1.24)
GFR calc Af Amer: >60 mL/min (ref >60)
GFR calc non Af Amer: >60 mL/min (ref >60)
Glucose, Bld: 172 mg/dL — ABNORMAL HIGH (ref 70–99)
POTASSIUM: 3.1 mmol/L — AB (ref 3.5–5.1)
Sodium: 141 mmol/L (ref 135–145)
TOTAL PROTEIN: 8.8 g/dL — AB (ref 6.5–8.1)
Total Bilirubin: 1.3 mg/dL — ABNORMAL HIGH (ref 0.3–1.2)

## 2018-08-06 LAB — SEDIMENTATION RATE: SED RATE: 67 mm/h — AB (ref 0–20)

## 2018-08-06 NOTE — Progress Notes (Signed)
Patient is here today to follow up on his Hepatocellular carcinoma. Patient stated that he tries to walk about a mile and he gets tired.

## 2018-08-07 LAB — AFP TUMOR MARKER: AFP, Serum, Tumor Marker: 11132 ng/mL — ABNORMAL HIGH (ref 0.0–8.3)

## 2018-08-08 LAB — FUNGITELL, SERUM: Fungitell Result: <31 pg/mL (ref <80)

## 2018-08-12 ENCOUNTER — Telehealth: Payer: Self-pay

## 2018-08-12 ENCOUNTER — Telehealth: Payer: Self-pay | Admitting: *Deleted

## 2018-08-12 NOTE — Telephone Encounter (Signed)
Call placed to interventional radiology regarding follow up, state they will call patient and schedule follow up for liver mass.

## 2018-08-12 NOTE — Telephone Encounter (Signed)
-----   Message from Jonathon Bellows, MD sent at 08/12/2018 10:11 AM EST -----  Andrew Gibbs inform that he has no active hepatitis C , hence does not need treatment for heaptitis C. His viral load is negative. He also has evidence of prior hepatitis B exposure - likely both cleared spontaneously. To complete work up suggest check Hep B viral load and Hep B surface antigen. 5% of patients with chronic hepatitis C can clear the virus spntaneously  C/c Dr Grayland Ormond and Dr Delaine Lame

## 2018-08-12 NOTE — Telephone Encounter (Signed)
Spoke with pt and pt sister, [name], and informed them of pt lab results and informed pt that he has been referred to [facility] for Liver Transplant evaluation. They are aware that [facility] will contact pt to schedule.

## 2018-08-27 ENCOUNTER — Ambulatory Visit
Admission: RE | Admit: 2018-08-27 | Discharge: 2018-08-27 | Disposition: A | Discharge status: Home / Self Care | Payer: Medicare Other | Source: Ambulatory Visit | Attending: Interventional Radiology | Admitting: Interventional Radiology

## 2018-08-27 ENCOUNTER — Ambulatory Visit (HOSPITAL_COMMUNITY)
Admission: RE | Admit: 2018-08-27 | Discharge: 2018-08-27 | Disposition: A | Discharge status: Home / Self Care | Payer: Medicare Other | Source: Ambulatory Visit | Attending: Interventional Radiology | Admitting: Interventional Radiology

## 2018-08-27 ENCOUNTER — Encounter (HOSPITAL_COMMUNITY): Payer: Self-pay

## 2018-08-27 DIAGNOSIS — R160 Hepatomegaly, not elsewhere classified: Secondary | ICD-10-CM | POA: Diagnosis not present

## 2018-08-27 HISTORY — PX: IR RADIOLOGIST EVAL & MGMT: IMG5224

## 2018-08-27 MED ORDER — SODIUM CHLORIDE (PF) 0.9 % IJ SOLN
INTRAMUSCULAR | Status: AC
  Filled 2018-08-27: qty 50

## 2018-08-27 MED ORDER — IOPAMIDOL (ISOVUE-370) INJECTION 76%
INTRAVENOUS | Status: AC
  Filled 2018-08-27: qty 100

## 2018-08-27 MED ORDER — IOPAMIDOL (ISOVUE-370) INJECTION 76%
100.0000 mL | Freq: Once | INTRAVENOUS | Status: AC | PRN
  Administered 2018-08-27: 100 mL via INTRAVENOUS

## 2018-08-27 NOTE — Progress Notes (Signed)
Chief Complaint: Liver tumor  Referring Physician(s): Dr. Grayland Ormond  History of Present Illness: Andrew Gibbs is a 63 y.o. male presenting as a follow up to [facility] with a known liver tumor, suspected [place].    He was first referred for consultation after a hospitalization at [facility] in October of 2019.  He was hospitalized with arm ulcer, and known illicit drug use, including some use of injectables.    At that time he was diagnosed with fungemia, and serratia from wound culture. 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  ID followed his admission and as outpatient.    During his hospitalization a liver mass was diagnosed on imaging, with cirrhosis.   We first met him during clinic visit 05/23/2019.  We discussed treating his liver mass with either ablation or a combination of intra-arterial therapy and ablation, however, given concern for his ID and functional status, we deferred to make sure his infections were treated.    He returns today with his sister for the interview.  He tells me he has been much more compliant with his doctors visits, and has established GI care as well.  In fact, he has a pending [facility] appointment on Monday to be evaluated for liver transplant. He has had follow up with Dr. Delaine Lame with ID and with Dr. Grayland Ormond with Oncology.    We repeated a CT today with CTA for planning for a y90 procedure to target the liver disease.  The primary lesion measures about 4.6cm today, however, there is evidence of a more infiltrative process adjacent to the primary rounded tumor.  This may be infiltrative HCC or just nodular/cirrhotic liver.    His AFP has increased, and is currently above 11,000, up from ~7,000.    He is perfectly functional as far as his ADL's, so ECOG = 0.    Past Medical History:  Diagnosis Date  . Blood dyscrasia    thrombocytopenia  . Hepatitis C   . Heroin use     Past Surgical History:  Procedure Laterality Date  . IR  RADIOLOGIST EVAL & MGMT  05/22/2018  . IR RADIOLOGIST EVAL & MGMT  08/27/2018  . KNEE SURGERY    . TEE WITHOUT CARDIOVERSION N/A 04/22/2018   Procedure: TRANSESOPHAGEAL ECHOCARDIOGRAM (TEE);  Surgeon: Teodoro Spray, MD;  Location: [facility] ORS;  Service: Cardiovascular;  Laterality: N/A;    Allergies: Patient has no known allergies.  Medications: Prior to Admission medications   Medication Sig Start Date End Date Taking? Authorizing Provider  fluconazole (DIFLUCAN) 200 MG tablet Take 600 mg by mouth daily.   Yes [provider]  furosemide (LASIX) 20 MG tablet Take 1 tablet (20 mg total) by mouth daily. 07/29/18 10/28/18 Yes Jonathon Bellows, MD  Multiple Vitamin (MULTIVITAMIN WITH MINERALS) TABS tablet Take 1 tablet by mouth daily. 05/03/18  Yes Epifanio Lesches, MD  spironolactone (ALDACTONE) 50 MG tablet Take 1 tablet (50 mg total) by mouth daily. 07/29/18 09/27/18 Yes Jonathon Bellows, MD     Family History  Problem Relation Age of Onset  . Multiple myeloma Mother     Social History   Socioeconomic History  . Marital status: Widowed    Spouse name: Not on file  . Number of children: Not on file  . Years of education: Not on file  . Highest education level: Not on file  Occupational History  . Not on file  Social Needs  . Financial resource strain: Not on file  . Food insecurity:  Worry: Not on file    Inability: Not on file  . Transportation needs:    Medical: Not on file    Non-medical: Not on file  Tobacco Use  . Smoking status: Current Every Day Smoker  . Smokeless tobacco: Never Used  Substance and Sexual Activity  . Alcohol use: Yes    Comment: 40 oz today  . Drug use: Yes    Types: IV, Cocaine, Heroin    Comment: heroin, states he used to use cocaine  . Sexual activity: Not on file  Lifestyle  . Physical activity:    Days per week: Not on file    Minutes per session: Not on file  . Stress: Not on file  Relationships  . Social connections:    Talks on  phone: Not on file    Gets together: Not on file    Attends religious service: Not on file    Active member of club or organization: Not on file    Attends meetings of clubs or organizations: Not on file    Relationship status: Not on file  Other Topics Concern  . Not on file  Social History Narrative  . Not on file    ECOG Status: 0 - Asymptomatic  Review of Systems: A 12 point ROS discussed and pertinent positives are indicated in the HPI above.  All other systems are negative.  Review of Systems  Vital Signs: BP (!) 143/79   Pulse 61   Temp 98.7 F (37.1 C) (Oral)   Resp 14   Ht 5' 7" (1.702 m)   Wt 61.7 kg   SpO2 100%   BMI 21.30 kg/m   Physical Exam General: 63 yo male appearing  stated age.  Well-developed, well-nourished.  No distress. HEENT: Atraumatic, normocephalic.  Conjugate gaze, extra-ocular motor intact. No scleral icterus or scleral injection. No lesions on external ears, nose, lips, or gums.  Oral mucosa moist, pink.  Neck: Symmetric with no goiter enlargement.  Chest/Lungs:  Symmetric chest with inspiration/expiration.  No labored breathing.  Clear to auscultation with no wheezes, rhonchi, or rales.  Heart:  RRR, with no third heart sounds appreciated. No JVD appreciated.  Abdomen:  Soft, NT/ND, with + bowel sounds.   Genito-urinary: Deferred Neurologic: Alert & Oriented to person, place, and time.   Normal affect and insight.  Appropriate questions.  Moving all 4 extremities with gross sensory intact.    Mallampati Score:  1 Imaging: Ir Radiologist Eval & Mgmt  Result Date: 08/27/2018 Please refer to notes tab for details about interventional procedure. (Op Note)  Ct Angio Abd/pel W/ And/or W/o  Result Date: 08/27/2018 CLINICAL DATA:  63 year old male with hepatocellular carcinoma EXAM: CT ANGIOGRAPHY ABDOMEN AND PELVIS WITH CONTRAST AND WITHOUT CONTRAST TECHNIQUE: Multidetector CT imaging of the abdomen and pelvis was performed using the standard  protocol during bolus administration of intravenous contrast. Multiplanar reconstructed images and MIPs were obtained and reviewed to evaluate the vascular anatomy. CONTRAST:  177m ISOVUE-370 IOPAMIDOL (ISOVUE-370) INJECTION 76% COMPARISON:  Prior MRI 04/20/18 FINDINGS: VASCULAR Aorta: Normal caliber aorta with mild atherosclerotic vascular calcifications but no evidence of aneurysm, dissection or penetrating ulcer. Celiac: Widely patent celiac artery with conventional hepatic arterial anatomy. The segment 4 artery appears to arise from the right main hepatic artery. No definite right gastric artery identified. SMA: Widely patent. No evidence of accessory or replaced right hepatic artery. Renals: Small accessory renal arteries bilaterally. The main renal arteries are widely patent. No evidence of dissection, aneurysm  or fibromuscular dysplasia. IMA: Patent without evidence of aneurysm, dissection, vasculitis or significant stenosis. Inflow: Patent without evidence of aneurysm, dissection, vasculitis or significant stenosis. Veins: No focal venous abnormality. The portal, visceral and renal veins are all patent. Review of the MIP images confirms the above findings. NON-VASCULAR Lower chest: Mild dependent atelectasis. Cardiomegaly. No pericardial effusion. Unremarkable distal thoracic esophagus. Hepatobiliary: Hepatomegaly with a diffusely nodular contour and relative hypertrophy of the left hepatic lobe consistent with hepatic cirrhosis. There is a well-defined arterially enhancing mass measuring 4.6 x 4.6 cm which demonstrates washout on delayed portal venous imaging and a subtle pseudo capsule which appears to be centered in hepatic segment 4 spanning 4a and 4b. These imaging characteristics are diagnostic of hepatocellular carcinoma. In addition to this definitive lesion, there are multiple areas of arterial phase enhancement which are less well-defined scattered throughout both the right and left hepatic lobe.  Many of these regions are subcapsular in nature. The underlying liver is extremely heterogeneous on subsequent portal venous and delayed venous phase imaging raising concern for multifocal hepatocellular carcinoma versus multiple regenerating and/or dysplastic nodules. Multiple peripherally calcified gallstones within a contracted bladder. No evidence of intra or extrahepatic biliary ductal dilatation. Pancreas: Unremarkable. No pancreatic ductal dilatation or surrounding inflammatory changes. Spleen: Splenomegaly.  No discrete splenic lesion. Adrenals/Urinary Tract: Normal adrenal glands. No evidence of hydronephrosis, nephrolithiasis or enhancing renal mass. Circumscribed water attenuation cystic lesions bilaterally consistent with simple cysts. There are several areas of focal cortical thinning consistent with renal cortical scarring primarily at the upper pole of the right kidney in the interpolar region of the left kidney. Unremarkable ureters and bladder. Stomach/Bowel: No evidence of obstruction or focal bowel wall thickening. Normal appendix in the right lower quadrant. The terminal ileum is unremarkable. Lymphatic: No suspicious lymphadenopathy. Reproductive: Prostate is unremarkable. Other: Small fat containing umbilical hernia. No abdominopelvic ascites. Musculoskeletal: No acute fracture or aggressive appearing lytic or blastic osseous lesion. IMPRESSION: VASCULAR 1. Conventional hepatic arterial anatomy without evidence of replaced or accessory right hepatic artery. The segment 4 artery appears to arise from the proximal right hepatic artery. 2. Mild aortic atherosclerotic plaque. Aortic Atherosclerosis (ICD10-170.0). 3. No definite right gastric artery visualized. NON-VASCULAR 1. Interval progression of hepatocellular carcinoma. The definitive [place] in segment 4 has enlarged presently measuring 4.6 x 4.6 cm compared to 4.0 cm previously. 2. Additional multifocal areas of less well-defined and  infiltrative appearing arterial hyperenhancement are present in a predominantly subcapsular location in both the right and left hepatic lobes. Due to underlying heterogeneity, definite portal venous washout is more difficult to confirm in these regions. Differential considerations include infiltrative multifocal HCC and multiple regenerating and/or dysplastic nodules. 3. Hepatic cirrhosis with splenomegaly. 4. Small fat containing umbilical hernia. 5. Cardiomegaly. Electronically Signed   By: Jacqulynn Cadet M.D.   On: 08/27/2018 13:56    Labs:  CBC: Recent Labs    04/24/18 0515 05/01/18 0608 06/25/18 0951 08/06/18 1125  WBC 4.1 4.6 4.6 4.7  HGB 10.8* 9.9* 10.0* 11.9*  HCT 32.2* 29.2* 30.7* 36.8*  PLT 52* 43* 64* 77*    COAGS: Recent Labs    02/13/18 2207 04/17/18 1233 06/25/18 0951 07/29/18 1441  INR 1.41 1.54 1.42 1.3*  APTT  --  37*  --   --     BMP: Recent Labs    05/16/18 1016 06/25/18 0951 07/29/18 1441 08/06/18 1125  NA 139 140 142 141  K 4.4 3.3* 3.0* 3.1*  CL 109 108 106 106  CO2 _0 GLUCOSE 96 118* 101* 172*  BUN 10 10 7* 11  CALCIUM 8.5* 9.2 9.2 9.2  CREATININE 0.71 0.71 0.71* 0.81  GFRNONAA >60 >60 101 >60  GFRAA >60 >60 116 >60    LIVER FUNCTION TESTS: Recent Labs    05/16/18 1016 06/25/18 0951 07/29/18 1441 08/06/18 1125  BILITOT 1.6* 1.8* 1.4* 1.3*  AST 66* 64* 160* 67*  ALT 19 20 35 21  ALKPHOS 131* 158* 185* 162*  PROT 8.6* 8.3* 8.2 8.8*  ALBUMIN 2.3* 2.5* 3.0* 2.7*    TUMOR MARKERS: No results for input(s): AFPTM, CEA, CA199, CHROMGRNA in the last 8760 hours.  Assessment and Plan:  Mr Luna is a 63 yo male with cirrhosis and imaging of segment 4 HCC, and with additional imaging of possible infiltrative disease of his right liver.    While we originally delayed liver directed therapy because of his systemic infections/endocarditis, it does not look like he will be ending his anti-fungal medications, and he has rising  AFP and CT changes over time.    I did discuss all of this with him, as well as our potential treatment options.  It looks like his disease pattern will be at least intermediate stage, however, I think it is important to prove the pathology of the infiltrative right liver disease before offering y90 to this region.  Thus, we will go for US guided biopsy of this area, knowing that the 4.6cm lesion needs to be treated.     If the infiltrative region is tumor, we will plan for y90 with likely bilobar treatment with possible segment 4 radiation segmentectomy or bilobar treatment with additional segment 4 bland embo.    If the infiltrative region is not HCC, we will plan for y90 segmentectomy of segment 4.    I discussed this with Mr Kaczmarek and his sister.  Specific risks that were discussed for both the US guided biopsy and the y90 mapping/treatment included: bleeding, infection, arterial injury, local injury, need for further surgery/procedure, post-embolization syndrome, worsening of his systemic infection, non-target embolization, non-diagnostic biopsy, cardiopulmonary collapse, death.    After our discussion, they would like to proceed.   Plan: -  US guided biopsy of the right liver, infiltrative region, not the segment 4 tumor, to confirm additional site of disease. This biopsy should occur, by the patient's request, at [facility] (ease of travel) and after his [facility] appointment on 09/02/2018.  - Plan for y90 mapping and treatment, both the right and left lobes. The left lobe would be treated first. - I have advised him to observe his other doctors appointments, including the [facility] appointment at transplant center.    Electronically Signed: Corrie Mckusick 08/27/2018, 3:55 PM   I spent a total of    25 Minutes in face to face in clinical consultation, greater than 50% of which was counseling/coordinating care for cirrhosis, HCC, possible US guided biopsy, possible y90 as treatment for [place].

## 2018-08-28 ENCOUNTER — Other Ambulatory Visit: Payer: Self-pay | Admitting: Interventional Radiology

## 2018-08-28 DIAGNOSIS — R160 Hepatomegaly, not elsewhere classified: Secondary | ICD-10-CM

## 2018-08-29 ENCOUNTER — Telehealth: Payer: Self-pay | Admitting: Gastroenterology

## 2018-08-29 NOTE — Telephone Encounter (Signed)
Pt sister is calling to inform Dr. Vicente Males that pt r/s his apt for [facility] from the 17th to 10/14/18

## 2018-09-11 ENCOUNTER — Other Ambulatory Visit: Payer: Self-pay | Admitting: Radiology

## 2018-09-12 ENCOUNTER — Ambulatory Visit
Admission: RE | Admit: 2018-09-12 | Discharge: 2018-09-12 | Disposition: A | Discharge status: Home / Self Care | Payer: Medicare Other | Source: Ambulatory Visit | Attending: Interventional Radiology | Admitting: Interventional Radiology

## 2018-09-12 ENCOUNTER — Other Ambulatory Visit: Payer: Self-pay

## 2018-09-12 DIAGNOSIS — C220 Liver cell carcinoma: Secondary | ICD-10-CM | POA: Diagnosis not present

## 2018-09-12 DIAGNOSIS — F17210 Nicotine dependence, cigarettes, uncomplicated: Secondary | ICD-10-CM | POA: Diagnosis not present

## 2018-09-12 DIAGNOSIS — B1920 Unspecified viral hepatitis C without hepatic coma: Secondary | ICD-10-CM | POA: Insufficient documentation

## 2018-09-12 DIAGNOSIS — K7460 Unspecified cirrhosis of liver: Secondary | ICD-10-CM | POA: Insufficient documentation

## 2018-09-12 DIAGNOSIS — R160 Hepatomegaly, not elsewhere classified: Secondary | ICD-10-CM

## 2018-09-12 LAB — CBC
HCT: 37 % — ABNORMAL LOW (ref 39.0–52.0)
Hemoglobin: 12.6 g/dL — ABNORMAL LOW (ref 13.0–17.0)
MCH: 33 pg (ref 26.0–34.0)
MCHC: 34.1 g/dL (ref 30.0–36.0)
MCV: 96.9 fL (ref 80.0–100.0)
Platelets: 87 10*3/uL — ABNORMAL LOW (ref 150–400)
RBC: 3.82 MIL/uL — ABNORMAL LOW (ref 4.22–5.81)
RDW: 16.2 % — ABNORMAL HIGH (ref 11.5–15.5)
WBC: 5.8 10*3/uL (ref 4.0–10.5)
nRBC: 0 % (ref 0.0–0.2)

## 2018-09-12 LAB — PROTIME-INR
INR: 1.2 (ref 0.8–1.2)
Prothrombin Time: 15.2 seconds (ref 11.4–15.2)

## 2018-09-12 MED ORDER — MIDAZOLAM HCL 5 MG/5ML IJ SOLN
INTRAMUSCULAR | Status: AC | PRN
  Administered 2018-09-12 (×3): 1 mg via INTRAVENOUS

## 2018-09-12 MED ORDER — FENTANYL CITRATE (PF) 100 MCG/2ML IJ SOLN
INTRAMUSCULAR | Status: AC
  Filled 2018-09-12: qty 4

## 2018-09-12 MED ORDER — FENTANYL CITRATE (PF) 100 MCG/2ML IJ SOLN
INTRAMUSCULAR | Status: AC | PRN
  Administered 2018-09-12: 25 ug via INTRAVENOUS
  Administered 2018-09-12: 50 ug via INTRAVENOUS

## 2018-09-12 MED ORDER — MIDAZOLAM HCL 5 MG/5ML IJ SOLN
INTRAMUSCULAR | Status: AC
  Filled 2018-09-12: qty 5

## 2018-09-12 MED ORDER — SODIUM CHLORIDE 0.9 % IV SOLN: INTRAVENOUS | Status: DC

## 2018-09-12 NOTE — H&P (Signed)
Chief Complaint: Patient was seen in consultation today for liver biopsy at the request of [facility]  Referring Physician(s): Wagner,Jaime  Patient Status: [facility] - Out-pt  History of Present Illness: Andrew Gibbs is a 63 y.o. male with hepatocellular carcinoma and hepatitis C known to our service after prior consultation with Dr. Earleen Newport for liver directed therapy of a 4.5 cm segment IV HCC. Recent planning CTA demonstrates new areas of infiltrative hyperenhancement in the adjacent left and right lobes suspicious for potential tumor. Now presenting for biopsy of one of these areas to determine if there is additional Parsons in right lobe. Some mild right abdominal pain today.  Past Medical History:  Diagnosis Date  . Blood dyscrasia    thrombocytopenia  . Hepatitis C   . Heroin use     Past Surgical History:  Procedure Laterality Date  . IR RADIOLOGIST EVAL & MGMT  05/22/2018  . IR RADIOLOGIST EVAL & MGMT  08/27/2018  . KNEE SURGERY    . TEE WITHOUT CARDIOVERSION N/A 04/22/2018   Procedure: TRANSESOPHAGEAL ECHOCARDIOGRAM (TEE);  Surgeon: Teodoro Spray, MD;  Location: [facility] ORS;  Service: Cardiovascular;  Laterality: N/A;    Allergies: Patient has no known allergies.  Medications: Prior to Admission medications   Medication Sig Start Date End Date Taking? Authorizing Provider  fluconazole (DIFLUCAN) 200 MG tablet Take 600 mg by mouth daily.   Yes [provider]  furosemide (LASIX) 20 MG tablet Take 1 tablet (20 mg total) by mouth daily. 07/29/18 10/28/18 Yes Jonathon Bellows, MD  Multiple Vitamin (MULTIVITAMIN WITH MINERALS) TABS tablet Take 1 tablet by mouth daily. 05/03/18  Yes Epifanio Lesches, MD  spironolactone (ALDACTONE) 50 MG tablet Take 1 tablet (50 mg total) by mouth daily. 07/29/18 09/27/18 Yes Jonathon Bellows, MD     Family History  Problem Relation Age of Onset  . Multiple myeloma Mother     Social History   Socioeconomic History  . Marital status:  Widowed    Spouse name: Not on file  . Number of children: Not on file  . Years of education: Not on file  . Highest education level: Not on file  Occupational History  . Not on file  Social Needs  . Financial resource strain: Not on file  . Food insecurity:    Worry: Not on file    Inability: Not on file  . Transportation needs:    Medical: Not on file    Non-medical: Not on file  Tobacco Use  . Smoking status: Current Every Day Smoker  . Smokeless tobacco: Never Used  Substance and Sexual Activity  . Alcohol use: Yes    Comment: 40 oz today  . Drug use: Not Currently    Types: IV, Cocaine, Heroin    Comment: heroin, states he used to use cocaine  . Sexual activity: Not on file  Lifestyle  . Physical activity:    Days per week: Not on file    Minutes per session: Not on file  . Stress: Not on file  Relationships  . Social connections:    Talks on phone: Not on file    Gets together: Not on file    Attends religious service: Not on file    Active member of club or organization: Not on file    Attends meetings of clubs or organizations: Not on file    Relationship status: Not on file  Other Topics Concern  . Not on file  Social History Narrative  .  Not on file    ECOG Status: 1 - Symptomatic but completely ambulatory  Review of Systems: A 12 point ROS discussed and pertinent positives are indicated in the HPI above.  All other systems are negative.  Review of Systems  Constitutional: Negative.   Respiratory: Negative.   Cardiovascular: Negative.   Gastrointestinal: Positive for abdominal pain. Negative for abdominal distention, blood in stool, diarrhea, nausea and vomiting.  Genitourinary: Negative.   Musculoskeletal: Negative.   Neurological: Negative.     Vital Signs: BP 116/81   Pulse (!) 103   Temp 98.1 F (36.7 C) (Oral)   Resp 19   Ht _0  (1.702 m)   Wt 61.7 kg   SpO2 97%   BMI 21.30 kg/m   Physical Exam Vitals signs reviewed.    Constitutional:      General: He is not in acute distress.    Appearance: He is not ill-appearing, toxic-appearing or diaphoretic.  HENT:     Head: Normocephalic and atraumatic.     Mouth/Throat:     Mouth: Mucous membranes are moist.  Neck:     Musculoskeletal: Neck supple. No muscular tenderness.  Cardiovascular:     Rate and Rhythm: Normal rate and regular rhythm.     Heart sounds: Normal heart sounds. No murmur. No friction rub. No gallop.   Pulmonary:     Effort: Pulmonary effort is normal. No respiratory distress.     Breath sounds: No stridor. No wheezing or rhonchi.     Comments: Mild bibasilar crackles. Abdominal:     General: Abdomen is flat. Bowel sounds are normal. There is no distension.     Palpations: Abdomen is soft.     Tenderness: There is no abdominal tenderness. There is no guarding or rebound.  Musculoskeletal:        General: No swelling.  Lymphadenopathy:     Cervical: No cervical adenopathy.  Skin:    General: Skin is warm and dry.  Neurological:     Mental Status: He is alert and oriented to person, place, and time.     Imaging: Ir Radiologist Eval & Mgmt  Result Date: 08/27/2018 Please refer to notes tab for details about interventional procedure. (Op Note)  Ct Angio Abd/pel W/ And/or W/o  Result Date: 08/27/2018 CLINICAL DATA:  63 year old male with hepatocellular carcinoma EXAM: CT ANGIOGRAPHY ABDOMEN AND PELVIS WITH CONTRAST AND WITHOUT CONTRAST TECHNIQUE: Multidetector CT imaging of the abdomen and pelvis was performed using the standard protocol during bolus administration of intravenous contrast. Multiplanar reconstructed images and MIPs were obtained and reviewed to evaluate the vascular anatomy. CONTRAST:  135m ISOVUE-370 IOPAMIDOL (ISOVUE-370) INJECTION 76% COMPARISON:  Prior MRI 04/20/18 FINDINGS: VASCULAR Aorta: Normal caliber aorta with mild atherosclerotic vascular calcifications but no evidence of aneurysm, dissection or penetrating  ulcer. Celiac: Widely patent celiac artery with conventional hepatic arterial anatomy. The segment 4 artery appears to arise from the right main hepatic artery. No definite right gastric artery identified. SMA: Widely patent. No evidence of accessory or replaced right hepatic artery. Renals: Small accessory renal arteries bilaterally. The main renal arteries are widely patent. No evidence of dissection, aneurysm or fibromuscular dysplasia. IMA: Patent without evidence of aneurysm, dissection, vasculitis or significant stenosis. Inflow: Patent without evidence of aneurysm, dissection, vasculitis or significant stenosis. Veins: No focal venous abnormality. The portal, visceral and renal veins are all patent. Review of the MIP images confirms the above findings. NON-VASCULAR Lower chest: Mild dependent atelectasis. Cardiomegaly. No pericardial effusion. Unremarkable distal thoracic  esophagus. Hepatobiliary: Hepatomegaly with a diffusely nodular contour and relative hypertrophy of the left hepatic lobe consistent with hepatic cirrhosis. There is a well-defined arterially enhancing mass measuring 4.6 x 4.6 cm which demonstrates washout on delayed portal venous imaging and a subtle pseudo capsule which appears to be centered in hepatic segment 4 spanning 4a and 4b. These imaging characteristics are diagnostic of hepatocellular carcinoma. In addition to this definitive lesion, there are multiple areas of arterial phase enhancement which are less well-defined scattered throughout both the right and left hepatic lobe. Many of these regions are subcapsular in nature. The underlying liver is extremely heterogeneous on subsequent portal venous and delayed venous phase imaging raising concern for multifocal hepatocellular carcinoma versus multiple regenerating and/or dysplastic nodules. Multiple peripherally calcified gallstones within a contracted bladder. No evidence of intra or extrahepatic biliary ductal dilatation.  Pancreas: Unremarkable. No pancreatic ductal dilatation or surrounding inflammatory changes. Spleen: Splenomegaly.  No discrete splenic lesion. Adrenals/Urinary Tract: Normal adrenal glands. No evidence of hydronephrosis, nephrolithiasis or enhancing renal mass. Circumscribed water attenuation cystic lesions bilaterally consistent with simple cysts. There are several areas of focal cortical thinning consistent with renal cortical scarring primarily at the upper pole of the right kidney in the interpolar region of the left kidney. Unremarkable ureters and bladder. Stomach/Bowel: No evidence of obstruction or focal bowel wall thickening. Normal appendix in the right lower quadrant. The terminal ileum is unremarkable. Lymphatic: No suspicious lymphadenopathy. Reproductive: Prostate is unremarkable. Other: Small fat containing umbilical hernia. No abdominopelvic ascites. Musculoskeletal: No acute fracture or aggressive appearing lytic or blastic osseous lesion. IMPRESSION: VASCULAR 1. Conventional hepatic arterial anatomy without evidence of replaced or accessory right hepatic artery. The segment 4 artery appears to arise from the proximal right hepatic artery. 2. Mild aortic atherosclerotic plaque. Aortic Atherosclerosis (ICD10-170.0). 3. No definite right gastric artery visualized. NON-VASCULAR 1. Interval progression of hepatocellular carcinoma. The definitive [place] in segment 4 has enlarged presently measuring 4.6 x 4.6 cm compared to 4.0 cm previously. 2. Additional multifocal areas of less well-defined and infiltrative appearing arterial hyperenhancement are present in a predominantly subcapsular location in both the right and left hepatic lobes. Due to underlying heterogeneity, definite portal venous washout is more difficult to confirm in these regions. Differential considerations include infiltrative multifocal HCC and multiple regenerating and/or dysplastic nodules. 3. Hepatic cirrhosis with splenomegaly. 4.  Small fat containing umbilical hernia. 5. Cardiomegaly. Electronically Signed   By: Jacqulynn Cadet M.D.   On: 08/27/2018 13:56    Labs:  CBC: Recent Labs    05/01/18 0608 06/25/18 0951 08/06/18 1125 09/12/18 1023  WBC 4.6 4.6 4.7 5.8  HGB 9.9* 10.0* 11.9* 12.6*  HCT 29.2* 30.7* 36.8* 37.0*  PLT 43* 64* 77* 87*    COAGS: Recent Labs    04/17/18 1233 06/25/18 0951 07/29/18 1441 09/12/18 1023  INR 1.54 1.42 1.3* 1.2  APTT 37*  --   --   --     BMP: Recent Labs    05/16/18 1016 06/25/18 0951 07/29/18 1441 08/06/18 1125  NA 139 140 142 141  K 4.4 3.3* 3.0* 3.1*  CL 109 108 106 106  CO2 _0 GLUCOSE 96 118* 101* 172*  BUN 10 10 7* 11  CALCIUM 8.5* 9.2 9.2 9.2  CREATININE 0.71 0.71 0.71* 0.81  GFRNONAA >60 >60 101 >60  GFRAA >60 >60 116 >60    LIVER FUNCTION TESTS: Recent Labs    05/16/18 1016 06/25/18 0951 07/29/18 1441 08/06/18 1125  BILITOT 1.6* 1.8* 1.4* 1.3*  AST 66* 64* 160* 67*  ALT 19 20 35 21  ALKPHOS 131* 158* 185* 162*  PROT 8.6* 8.3* 8.2 8.8*  ALBUMIN 2.3* 2.5* 3.0* 2.7*    Assessment and Plan:  For US guided liver biopsy today. Will likely target anterior right lobe near left lobe and look for area(s) of abnormal parenchyma to target.  Risks and benefits of liver biopsy was discussed with the patient and/or patient's family including, but not limited to bleeding, infection, damage to adjacent structures or low yield requiring additional tests. All of the questions were answered and there is agreement to proceed. Consent signed and in chart.  Thank you for this interesting consult.  I greatly enjoyed meeting KAYLIB FURNESS and look forward to participating in their care.  A copy of this report was sent to the requesting provider on this date.  Electronically Signed: Azzie Roup, MD 09/12/2018, 11:15 AM     I spent a total of  15 Minutes in face to face in clinical consultation, greater than 50% of which was  counseling/coordinating care for liver biopsy.

## 2018-09-12 NOTE — Procedures (Signed)
Interventional Radiology Procedure Note  Procedure: US Guided Biopsy of Liver  Complications: None  Estimated Blood Loss: < 10 mL  Findings: 69 G core biopsy of liver in anterior right lobe performed under US guidance.  Three core samples obtained and sent to Pathology. Gelfoam pledgets advanced through outer needle on completion.  Venetia Night. Kathlene Cote, M.D Pager:  [phone]

## 2018-09-17 LAB — SURGICAL PATHOLOGY

## 2018-09-18 ENCOUNTER — Telehealth: Payer: Self-pay | Admitting: Gastroenterology

## 2018-09-18 NOTE — Telephone Encounter (Signed)
Jeanette Powell(sister/on release to speak with) called to get results from patient's liver bx done by Dr Vicente Males on 09-12-18

## 2018-09-20 NOTE — Telephone Encounter (Signed)
Spoke with pt sister, [name], and explained to her that the [facility] would contact pt with results. She understands and agrees and also states she has contacted [facility] and was informed that they would contact pt.

## 2018-10-11 ENCOUNTER — Other Ambulatory Visit: Payer: Self-pay | Admitting: Gastroenterology

## 2018-10-11 ENCOUNTER — Other Ambulatory Visit (HOSPITAL_COMMUNITY): Payer: Self-pay | Admitting: Interventional Radiology

## 2018-10-11 DIAGNOSIS — R772 Abnormality of alphafetoprotein: Secondary | ICD-10-CM

## 2018-10-11 DIAGNOSIS — C220 Liver cell carcinoma: Secondary | ICD-10-CM

## 2018-10-15 ENCOUNTER — Other Ambulatory Visit (HOSPITAL_COMMUNITY): Payer: Self-pay | Admitting: Interventional Radiology

## 2018-10-15 DIAGNOSIS — C220 Liver cell carcinoma: Secondary | ICD-10-CM

## 2018-10-16 ENCOUNTER — Ambulatory Visit
Admission: RE | Admit: 2018-10-16 | Discharge: 2018-10-16 | Disposition: A | Discharge status: Home / Self Care | Payer: Medicare Other | Source: Ambulatory Visit | Attending: Gastroenterology | Admitting: Gastroenterology

## 2018-10-16 ENCOUNTER — Other Ambulatory Visit (HOSPITAL_COMMUNITY): Payer: Self-pay | Admitting: Interventional Radiology

## 2018-10-16 ENCOUNTER — Other Ambulatory Visit: Payer: Self-pay

## 2018-10-16 ENCOUNTER — Other Ambulatory Visit: Payer: Self-pay | Admitting: Physician Assistant

## 2018-10-16 DIAGNOSIS — C220 Liver cell carcinoma: Secondary | ICD-10-CM | POA: Insufficient documentation

## 2018-10-16 DIAGNOSIS — R772 Abnormality of alphafetoprotein: Secondary | ICD-10-CM | POA: Insufficient documentation

## 2018-10-17 ENCOUNTER — Other Ambulatory Visit (HOSPITAL_COMMUNITY): Payer: Self-pay | Admitting: Interventional Radiology

## 2018-10-17 ENCOUNTER — Ambulatory Visit (HOSPITAL_COMMUNITY)
Admission: RE | Admit: 2018-10-17 | Discharge: 2018-10-17 | Disposition: A | Discharge status: Home / Self Care | Payer: Medicare Other | Source: Ambulatory Visit | Attending: Interventional Radiology | Admitting: Interventional Radiology

## 2018-10-17 ENCOUNTER — Encounter (HOSPITAL_COMMUNITY): Payer: Self-pay

## 2018-10-17 ENCOUNTER — Encounter (HOSPITAL_COMMUNITY)
Admission: RE | Admit: 2018-10-17 | Discharge: 2018-10-17 | Disposition: A | Discharge status: Home / Self Care | Payer: Medicare Other | Source: Ambulatory Visit | Attending: Oncology | Admitting: Oncology

## 2018-10-17 ENCOUNTER — Encounter (HOSPITAL_COMMUNITY): Payer: Medicare Other

## 2018-10-17 DIAGNOSIS — F1910 Other psychoactive substance abuse, uncomplicated: Secondary | ICD-10-CM | POA: Insufficient documentation

## 2018-10-17 DIAGNOSIS — C220 Liver cell carcinoma: Secondary | ICD-10-CM

## 2018-10-17 DIAGNOSIS — D649 Anemia, unspecified: Secondary | ICD-10-CM | POA: Insufficient documentation

## 2018-10-17 DIAGNOSIS — B1920 Unspecified viral hepatitis C without hepatic coma: Secondary | ICD-10-CM | POA: Diagnosis not present

## 2018-10-17 DIAGNOSIS — F17200 Nicotine dependence, unspecified, uncomplicated: Secondary | ICD-10-CM | POA: Insufficient documentation

## 2018-10-17 DIAGNOSIS — M545 Low back pain: Secondary | ICD-10-CM | POA: Insufficient documentation

## 2018-10-17 DIAGNOSIS — Z79899 Other long term (current) drug therapy: Secondary | ICD-10-CM | POA: Diagnosis not present

## 2018-10-17 DIAGNOSIS — A419 Sepsis, unspecified organism: Secondary | ICD-10-CM | POA: Insufficient documentation

## 2018-10-17 DIAGNOSIS — R5382 Chronic fatigue, unspecified: Secondary | ICD-10-CM | POA: Insufficient documentation

## 2018-10-17 DIAGNOSIS — Z720 Tobacco use: Secondary | ICD-10-CM | POA: Insufficient documentation

## 2018-10-17 DIAGNOSIS — D696 Thrombocytopenia, unspecified: Secondary | ICD-10-CM | POA: Insufficient documentation

## 2018-10-17 DIAGNOSIS — K7460 Unspecified cirrhosis of liver: Secondary | ICD-10-CM | POA: Diagnosis not present

## 2018-10-17 DIAGNOSIS — B1910 Unspecified viral hepatitis B without hepatic coma: Secondary | ICD-10-CM | POA: Insufficient documentation

## 2018-10-17 HISTORY — PX: IR ANGIOGRAM VISCERAL SELECTIVE: IMG657

## 2018-10-17 HISTORY — PX: IR ANGIOGRAM SELECTIVE EACH ADDITIONAL VESSEL: IMG667

## 2018-10-17 HISTORY — PX: IR EMBO ARTERIAL NOT HEMORR HEMANG INC GUIDE ROADMAPPING: IMG5448

## 2018-10-17 HISTORY — PX: IR US GUIDE VASC ACCESS RIGHT: IMG2390

## 2018-10-17 LAB — CBC WITH DIFFERENTIAL/PLATELET
Abs Immature Granulocytes: 0.01 10*3/uL (ref 0.00–0.07)
Basophils Absolute: 0 10*3/uL (ref 0.0–0.1)
Basophils Relative: 0 %
Eosinophils Absolute: 0 10*3/uL (ref 0.0–0.5)
Eosinophils Relative: 1 %
HCT: 37.4 % — ABNORMAL LOW (ref 39.0–52.0)
Hemoglobin: 12 g/dL — ABNORMAL LOW (ref 13.0–17.0)
Immature Granulocytes: 0 %
Lymphocytes Relative: 30 %
Lymphs Abs: 1.2 10*3/uL (ref 0.7–4.0)
MCH: 32.3 pg (ref 26.0–34.0)
MCHC: 32.1 g/dL (ref 30.0–36.0)
MCV: 100.5 fL — ABNORMAL HIGH (ref 80.0–100.0)
Monocytes Absolute: 0.3 10*3/uL (ref 0.1–1.0)
Monocytes Relative: 8 %
Neutro Abs: 2.5 10*3/uL (ref 1.7–7.7)
Neutrophils Relative %: 61 %
Platelets: 72 10*3/uL — ABNORMAL LOW (ref 150–400)
RBC: 3.72 MIL/uL — ABNORMAL LOW (ref 4.22–5.81)
RDW: 17.6 % — ABNORMAL HIGH (ref 11.5–15.5)
WBC: 4.1 10*3/uL (ref 4.0–10.5)
nRBC: 0 % (ref 0.0–0.2)

## 2018-10-17 LAB — COMPREHENSIVE METABOLIC PANEL
ALT: 19 U/L (ref 0–44)
AST: 111 U/L — ABNORMAL HIGH (ref 15–41)
Albumin: 2.5 g/dL — ABNORMAL LOW (ref 3.5–5.0)
Alkaline Phosphatase: 185 U/L — ABNORMAL HIGH (ref 38–126)
Anion gap: 9 (ref 5–15)
BUN: 11 mg/dL (ref 8–23)
CO2: 21 mmol/L — ABNORMAL LOW (ref 22–32)
Calcium: 8.7 mg/dL — ABNORMAL LOW (ref 8.9–10.3)
Chloride: 107 mmol/L (ref 98–111)
Creatinine, Ser: 0.72 mg/dL (ref 0.61–1.24)
GFR calc Af Amer: >60 mL/min (ref >60)
GFR calc non Af Amer: >60 mL/min (ref >60)
Glucose, Bld: 102 mg/dL — ABNORMAL HIGH (ref 70–99)
Potassium: 3.6 mmol/L (ref 3.5–5.1)
Sodium: 137 mmol/L (ref 135–145)
Total Bilirubin: 1.8 mg/dL — ABNORMAL HIGH (ref 0.3–1.2)
Total Protein: 8.5 g/dL — ABNORMAL HIGH (ref 6.5–8.1)

## 2018-10-17 LAB — PROTIME-INR
INR: 1.3 — ABNORMAL HIGH (ref 0.8–1.2)
Prothrombin Time: 15.8 seconds — ABNORMAL HIGH (ref 11.4–15.2)

## 2018-10-17 MED ORDER — LIDOCAINE HCL 1 % IJ SOLN
INTRAMUSCULAR | Status: AC
  Filled 2018-10-17: qty 20

## 2018-10-17 MED ORDER — IOHEXOL 300 MG/ML  SOLN
100.0000 mL | Freq: Once | INTRAMUSCULAR | Status: AC | PRN
  Administered 2018-10-17: 12:00:00 32 mL via INTRA_ARTERIAL

## 2018-10-17 MED ORDER — MIDAZOLAM HCL 2 MG/2ML IJ SOLN
INTRAMUSCULAR | Status: AC | PRN
  Administered 2018-10-17 (×6): 1 mg via INTRAVENOUS

## 2018-10-17 MED ORDER — SODIUM CHLORIDE 0.9 % IV SOLN
INTRAVENOUS | Status: DC
  Administered 2018-10-17: 08:00:00 via INTRAVENOUS

## 2018-10-17 MED ORDER — FENTANYL CITRATE (PF) 100 MCG/2ML IJ SOLN
INTRAMUSCULAR | Status: AC | PRN
  Administered 2018-10-17 (×2): 50 ug via INTRAVENOUS

## 2018-10-17 MED ORDER — LIDOCAINE HCL (PF) 1 % IJ SOLN
INTRAMUSCULAR | Status: AC | PRN
  Administered 2018-10-17: 10 mL

## 2018-10-17 MED ORDER — IOHEXOL 300 MG/ML  SOLN
100.0000 mL | Freq: Once | INTRAMUSCULAR | Status: AC | PRN
  Administered 2018-10-17: 12:00:00 99 mL via INTRA_ARTERIAL

## 2018-10-17 MED ORDER — MIDAZOLAM HCL 2 MG/2ML IJ SOLN
INTRAMUSCULAR | Status: AC
  Filled 2018-10-17: qty 6

## 2018-10-17 MED ORDER — FENTANYL CITRATE (PF) 100 MCG/2ML IJ SOLN
INTRAMUSCULAR | Status: AC
  Filled 2018-10-17: qty 4

## 2018-10-17 MED ORDER — TECHNETIUM TO 99M ALBUMIN AGGREGATED
5.4000 | Freq: Once | INTRAVENOUS | Status: AC | PRN
  Administered 2018-10-17: 5.4 via INTRAVENOUS

## 2018-10-17 MED ORDER — SODIUM CHLORIDE 0.9 % IV SOLN: INTRAVENOUS | Status: DC

## 2018-10-17 NOTE — Sedation Documentation (Signed)
transferred to nuc. med will continue to monitor.

## 2018-10-17 NOTE — Procedures (Signed)
Interventional Radiology Procedure Note  Procedure: US guided access right CFA.  Mesenteric angiogram, for planning of future y90 treatment. Empiric coil embolization of the GDA.  mAA dose into the right hepatic artery  Findings: Patent portal vein.  Standard hepatic anatomy. The contribution to the focal tumor is from right branches, no significant contribution to the focal tumor from the left.  Evidence of right hepatic infiltrative tumor, compatible with prior biopsy.   Complications: None  Recommendations:  - right hip straight x 4 hours - anticipate dc home in 4 ours - routine wound care right access site,  Do not submerge for 7 days - gentile hydration, including adequate oral hydration at home - to NM now for lung shunt/distribution - advance diet - will plan for future date of right liver treatment, y90 in 2-3 weeks   Signed,  Sundeep Destin S. Earleen Newport, DO

## 2018-10-17 NOTE — H&P (Addendum)
Chief Complaint: Patient was seen in consultation today for pre-Y90.  Referring Physician(s): Andrew Gibbs  Supervising Physician: Andrew Gibbs  Patient Status: [facility] - Out-pt  History of Present Illness: Andrew Gibbs is a 63 y.o. male with a past medical history significant for thrombocytopenia, hepatitis C, ETOH abuse, history of IVDU and recently diagnosed hepatocellular carcinoma followed by Dr. Benay Gibbs and Dr. Earleen Gibbs who presents today for pre-Y90 roadmapping with possible embolization. Andrew Gibbs originally presented to [facility] ED on 04/17/18 with complaints of right upper extremity pain and swelling. He was admitted for right upper extremity abscess, fungemia, bacteremia, discitis/osteomyelitis of C7-T1, aortic valve vegetation and endocarditis. During this admission he underwent an MRI liver with and without contrast to further evaluate a liver lesion that was previously seen in July of 2019 when patient presented to the ED for back pain. 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 He was ultimately discharged on 05/02/18 with plans for continued outpatient follow up.  He was referred to IR for possible liver-directed therapy consultation and he originally met with Dr. Earleen Gibbs on 05/22/18 and was deemed to be a candidate pending improvement in his infections. He was seen again for follow up with Dr. Earleen Gibbs on 08/27/18 and it was recommended that he undergo liver biopsy first to prove [facility], this was done by Dr. Kathlene Gibbs on 09/12/18 and pathology of this biopsy was consistent with [facility]. He was then scheduled for Y90 therapy for which he presents for roadmapping today.  Andrew Gibbs is somewhat somnolent on exam today, arouses to loud voice cues but easily falls back asleep initially. After conversing  a bit he is able to stay awake and provide some history, although he is somewhat of a poor historian - no family members at bedside today due to new visitor restrictions. When asked if he knows why he is here he states no - after reminding him of his visits with Dr. Earleen Gibbs and decision to proceed with treatment he states that he "has 3 doctors at [facility] so I don't know why I need to be here." He is unsure what he is meeting with doctors at [facility] for. He states he remembers the meetings with Dr. Earleen Gibbs but doesn't remember what exactly they talked about regarding procedure today. He would like to speak with Dr. Earleen Gibbs before signing the consent form today.   Past Medical History:  Diagnosis Date   Blood dyscrasia    thrombocytopenia   Hepatitis C    Heroin use     Past Surgical History:  Procedure Laterality Date   IR RADIOLOGIST EVAL & MGMT  05/22/2018   IR RADIOLOGIST EVAL & MGMT  08/27/2018   KNEE SURGERY     TEE WITHOUT CARDIOVERSION N/A 04/22/2018   Procedure: TRANSESOPHAGEAL ECHOCARDIOGRAM (TEE);  Surgeon: Teodoro Spray, MD;  Location: [facility] ORS;  Service: Cardiovascular;  Laterality: N/A;    Allergies: Patient has no known allergies.  Medications: Prior to Admission medications   Medication Sig Start Date End Date Taking? Authorizing Provider  fluconazole (DIFLUCAN) 200 MG tablet Take 600 mg by mouth daily.    [provider]  furosemide (LASIX) 20 MG tablet Take 1 tablet (20 mg total) by mouth daily. 07/29/18 10/28/18  Jonathon Bellows, MD  Multiple Vitamin (MULTIVITAMIN WITH MINERALS) TABS tablet Take 1 tablet by  mouth daily. 05/03/18   Epifanio Lesches, MD  spironolactone (ALDACTONE) 50 MG tablet Take 1 tablet (50 mg total) by mouth daily. 07/29/18 10/17/18  Jonathon Bellows, MD     Family History  Problem Relation Age of Onset   Multiple myeloma Mother     Social History   Socioeconomic History   Marital status: Widowed    Spouse name: Not on file    Number of children: Not on file   Years of education: Not on file   Highest education level: Not on file  Occupational History   Not on file  Social Needs   Financial resource strain: Not on file   Food insecurity:    Worry: Not on file    Inability: Not on file   Transportation needs:    Medical: Not on file    Non-medical: Not on file  Tobacco Use   Smoking status: Current Every Day Smoker   Smokeless tobacco: Never Used  Substance and Sexual Activity   Alcohol use: Yes    Comment: 40 oz today   Drug use: Not Currently    Types: IV, Cocaine, Heroin    Comment: heroin, states he used to use cocaine   Sexual activity: Not on file  Lifestyle   Physical activity:    Days per week: Not on file    Minutes per session: Not on file   Stress: Not on file  Relationships   Social connections:    Talks on phone: Not on file    Gets together: Not on file    Attends religious service: Not on file    Active member of club or organization: Not on file    Attends meetings of clubs or organizations: Not on file    Relationship status: Not on file  Other Topics Concern   Not on file  Social History Narrative   Not on file     Review of Systems: A 12 point ROS discussed and pertinent positives are indicated in the HPI above.  All other systems are negative.  Review of Systems  Constitutional: Negative for chills and fever.  Respiratory: Negative for cough and shortness of breath.   Gastrointestinal: Negative for abdominal pain, diarrhea, nausea and vomiting.  Neurological: Negative for headaches.    Vital Signs: There were no vitals taken for this visit.  Physical Exam Vitals signs reviewed.  Constitutional:      General: He is not in acute distress.    Comments: Somnolent initially - more alert after some conversation. Poor historian.   HENT:     Head: Normocephalic.  Cardiovascular:     Rate and Rhythm: Normal rate and regular rhythm.  Pulmonary:      Effort: Pulmonary effort is normal.     Breath sounds: Normal breath sounds.  Abdominal:     General: There is no distension.     Palpations: Abdomen is soft.     Tenderness: There is no abdominal tenderness.  Skin:    General: Skin is warm and dry.  Neurological:     Mental Status: He is oriented to person, place, and time.  Psychiatric:        Mood and Affect: Mood normal.        Behavior: Behavior normal.        Thought Content: Thought content normal.        Judgment: Judgment normal.      MD Evaluation Airway: WNL Heart: WNL Abdomen: WNL Chest/ Lungs: WNL ASA  Classification: 3 Mallampati/Airway Score: One   Imaging: Ct Chest Wo Contrast  Result Date: 10/16/2018 CLINICAL DATA:  Cirrhosis. Hepatocellular carcinoma diagnosed October 2019. Elevated AFP. Chest staging. Current smoker. EXAM: CT CHEST WITHOUT CONTRAST TECHNIQUE: Multidetector CT imaging of the chest was performed following the standard protocol without IV contrast. COMPARISON:  08/27/2018 CT angiogram of the abdomen and pelvis. 04/20/2018 MRI abdomen. 02/13/2018 chest CT. FINDINGS: Cardiovascular: Normal heart size. No significant pericardial effusion/thickening. Three-vessel coronary atherosclerosis. Atherosclerotic nonaneurysmal thoracic aorta. Normal caliber pulmonary arteries. Mediastinum/Nodes: No discrete thyroid nodules. Small lower thoracic esophageal varices, stable. No pathologically enlarged axillary, mediastinal or hilar lymph nodes, noting limited sensitivity for the detection of hilar adenopathy on this noncontrast study. Few tiny calcified nonenlarged right paratracheal and right hilar nodes from prior granulomatous disease, unchanged. Lungs/Pleura: No pneumothorax. No pleural effusion. Moderate bullous and paraseptal emphysema at the lung apices. No acute consolidative airspace disease, lung masses or significant pulmonary nodules. Upper abdomen: Markedly irregular liver surface compatible with hepatic  cirrhosis. Partial visualization of porcelain gallbladder. Partial visualization moderate splenomegaly. Trace perihepatic ascites. Musculoskeletal: No aggressive appearing focal osseous lesions. Symmetric mild gynecomastia, stable. Minimal thoracic spondylosis. IMPRESSION: 1. No evidence of metastatic disease in the chest. 2. Hepatic cirrhosis. Known liver masses not well visualized on this noncontrast chest CT study. Trace perihepatic ascites. Moderate splenomegaly. Small gastroesophageal varices. 3. Three-vessel coronary atherosclerosis. Aortic Atherosclerosis (ICD10-I70.0) and Emphysema (ICD10-J43.9). Electronically Signed   By: Ilona Sorrel M.D.   On: 10/16/2018 15:13    Labs:  CBC: Recent Labs    05/01/18 0608 06/25/18 0951 08/06/18 1125 09/12/18 1023  WBC 4.6 4.6 4.7 5.8  HGB 9.9* 10.0* 11.9* 12.6*  HCT 29.2* 30.7* 36.8* 37.0*  PLT 43* 64* 77* 87*    COAGS: Recent Labs    04/17/18 1233 06/25/18 0951 07/29/18 1441 09/12/18 1023 10/17/18 0800  INR 1.54 1.42 1.3* 1.2 1.3*  APTT 37*  --   --   --   --     BMP: Recent Labs    06/25/18 0951 07/29/18 1441 08/06/18 1125 10/17/18 0800  NA 140 142 141 137  K 3.3* 3.0* 3.1* 3.6  CL 108 106 106 107  CO2 '27 23 28 ' 21*  GLUCOSE 118* 101* 172* 102*  BUN 10 7* 11 11  CALCIUM 9.2 9.2 9.2 8.7*  CREATININE 0.71 0.71* 0.81 0.72  GFRNONAA >60 101 >60 >60  GFRAA >60 116 >60 >60    LIVER FUNCTION TESTS: Recent Labs    06/25/18 0951 07/29/18 1441 08/06/18 1125 10/17/18 0800  BILITOT 1.8* 1.4* 1.3* 1.8*  AST 64* 160* 67* 111*  ALT 20 35 21 19  ALKPHOS 158* 185* 162* 185*  PROT 8.3* 8.2 8.8* 8.5*  ALBUMIN 2.5* 3.0* 2.7* 2.5*    TUMOR MARKERS: No results for input(s): AFPTM, CEA, CA199, CHROMGRNA in the last 8760 hours.  Assessment and Plan:  63 y/o M with history of hepatitis C, cirrhosis, ETOH abuse, IVDU and recently diagnosed [place] followed by Dr. Benay Gibbs who presents today for pre-Y90 roadmapping as discussed during  most recent clinic visit with Dr. Earleen Gibbs.  Patient initially confused regarding procedure planned for today, after speaking with myself and Dr. Earleen Gibbs patient states understanding of the procedure and wishes to proceed.  Patient has been NPO since approximately 10 pm last night, he does not take blood thinning medications that he is aware of (none per chart) and he did not take any medications this morning. Afebrile, INR 1.3, creatinine 0.72, CEA/AFP/CBC pending  at time of this note writing.  Risks and benefits discussed with the patient including, but not limited to bleeding, infection, vascular injury, post procedural pain, nausea, vomiting and fatigue, contrast induced renal failure, liver failure, radiation injury to the bowel, radiation induced cholecystitis, neutropenia and possible need for additional procedures.  All of the patient's questions were answered, patient is agreeable to proceed.  Consent signed and in chart.  Thank you for this interesting consult.  I greatly enjoyed meeting Andrew Gibbs and look forward to participating in their care.  A copy of this report was sent to the requesting provider on this date.  Electronically Signed: Joaquim Nam, PA-C 10/17/2018, 9:17 AM   I spent a total of25 Minutes in face to face in clinical consultation, greater than 50% of which was counseling/coordinating care for pre-Y90.

## 2018-10-17 NOTE — Discharge Instructions (Signed)
Femoral Site Care °This sheet gives you information about how to care for yourself after your procedure. Your health care provider may also give you more specific instructions. If you have problems or questions, contact your health care provider. °What can I expect after the procedure? °After the procedure, it is common to have: °· Bruising that usually fades within 1-2 weeks. °· Tenderness at the site. °Follow these instructions at home: °Wound care °· Follow instructions from your health care provider about how to take care of your insertion site. Make sure you: °? Wash your hands with soap and water before you change your bandage (dressing). If soap and water are not available, use hand sanitizer. °? Change your dressing as told by your health care provider. °? Leave stitches (sutures), skin glue, or adhesive strips in place. These skin closures may need to stay in place for 2 weeks or longer. If adhesive strip edges start to loosen and curl up, you may trim the loose edges. Do not remove adhesive strips completely unless your health care provider tells you to do that. °· Do not take baths, swim, or use a hot tub until your health care provider approves. °· You may shower 24-48 hours after the procedure or as told by your health care provider. °? Gently wash the site with plain soap and water. °? Pat the area dry with a clean towel. °? Do not rub the site. This may cause bleeding. °· Do not apply powder or lotion to the site. Keep the site clean and dry. °· Check your femoral site every day for signs of infection. Check for: °? Redness, swelling, or pain. °? Fluid or blood. °? Warmth. °? Pus or a bad smell. °Activity °· For the first 2-3 days after your procedure, or as long as directed: °? Avoid climbing stairs as much as possible. °? Do not squat. °· Do not lift anything that is heavier than 10 lb (4.5 kg), or the limit that you are told, until your health care provider says that it is safe. °· Rest as  directed. °? Avoid sitting for a long time without moving. Get up to take short walks every 1-2 hours. °· Do not drive for 24 hours if you were given a medicine to help you relax (sedative). °General instructions °· Take over-the-counter and prescription medicines only as told by your health care provider. °· Keep all follow-up visits as told by your health care provider. This is important. °Contact a health care provider if you have: °· A fever or chills. °· You have redness, swelling, or pain around your insertion site. °Get help right away if: °· The catheter insertion area swells very fast. °· You pass out. °· You suddenly start to sweat or your skin gets clammy. °· The catheter insertion area is bleeding, and the bleeding does not stop when you hold steady pressure on the area. °· The area near or just beyond the catheter insertion site becomes pale, cool, tingly, or numb. °These symptoms may represent a serious problem that is an emergency. Do not wait to see if the symptoms will go away. Get medical help right away. Call your local emergency services (911 in the [place] Do not drive yourself to the hospital. °Summary °· After the procedure, it is common to have bruising that usually fades within 1-2 weeks. °· Check your femoral site every day for signs of infection. °· Do not lift anything that is heavier than 10 lb (4.5 kg), or the   limit that you are told, until your health care provider says that it is safe. This information is not intended to replace advice given to you by your health care provider. Make sure you discuss any questions you have with your health care provider. Document Released: 03/06/2014 Document Revised: 07/16/2017 Document Reviewed: 07/16/2017 Elsevier Interactive Patient Education  2019 [place].    Moderate Conscious Sedation, Adult, Care After These instructions provide you with information about caring for yourself after your procedure. Your health care provider may also  give you more specific instructions. Your treatment has been planned according to current medical practices, but problems sometimes occur. Call your health care provider if you have any problems or questions after your procedure. What can I expect after the procedure? After your procedure, it is common:  To feel sleepy for several hours.  To feel clumsy and have poor balance for several hours.  To have poor judgment for several hours.  To vomit if you eat too soon. Follow these instructions at home: For at least 24 hours after the procedure:   Do not: ? Participate in activities where you could fall or become injured. ? Drive. ? Use heavy machinery. ? Drink alcohol. ? Take sleeping pills or medicines that cause drowsiness. ? Make important decisions or sign legal documents. ? Take care of children on your own.  Rest. Eating and drinking  Follow the diet recommended by your health care provider.  If you vomit: ? Drink water, juice, or soup when you can drink without vomiting. ? Make sure you have little or no nausea before eating solid foods. General instructions  Have a responsible adult stay with you until you are awake and alert.  Take over-the-counter and prescription medicines only as told by your health care provider.  If you smoke, do not smoke without supervision.  Keep all follow-up visits as told by your health care provider. This is important. Contact a health care provider if:  You keep feeling nauseous or you keep vomiting.  You feel light-headed.  You develop a rash.  You have a fever. Get help right away if:  You have trouble breathing. This information is not intended to replace advice given to you by your health care provider. Make sure you discuss any questions you have with your health care provider. Document Released: 04/23/2013 Document Revised: 12/06/2015 Document Reviewed: 10/23/2015 Elsevier Interactive Patient Education  2019 [place] ARE FOR FUTURE REFERENCE   Post Y-90 Radioembolization Discharge Instructions  You have been given a radioactive material during your procedure.  While it is safe for you to be discharged home from the hospital, you need to proceed directly home.    Do not use public transportation, including air travel, lasting more than 2 hours for 1 week.  Avoid crowded public places for 1 week.  Adult visitors should try to avoid close contact with you for 1 week.    Children and pregnant females should not visit or have close contact with you for 1 week.  Items that you touch are not radioactive.  Do not sleep in the same bed as your partner for 1 week, and a condom should be used for sexual activity during the first 24 hours.  Your blood may be radioactive and caution should be used if any bleeding occurs during the recovery period.  Body fluids may be radioactive for 24 hours.  Wash your hands after voiding.  Men should sit to urinate.  Dispose of any soiled materials (flush down toilet or place in trash at home) during the first day.  Drink 6 to 8 glasses of fluids per day for 5 days to hydrate yourself.  If you need to see a doctor during the first week, you must let them know that you were treated with yttrium-90 microspheres, and will be slightly radioactive.  They can call Interventional Radiology [phone] with any questions.

## 2018-10-18 LAB — CEA: CEA: 5.5 ng/mL — ABNORMAL HIGH (ref 0.0–4.7)

## 2018-10-18 LAB — AFP TUMOR MARKER: AFP, Serum, Tumor Marker: 28138 ng/mL — ABNORMAL HIGH (ref 0.0–8.3)

## 2018-10-18 NOTE — Progress Notes (Signed)
The patient's family (niece) called our Water engineer.   I spoke with her regarding her uncle, who was with [place] yesterday at [facility] as outpatient for mesenteric angiogram as a planning study for future y90.    She tells me that he has had ongoing N/V since last night, starting about 2-3 hours after he arrived home.  He has had very little to eat/drink.  Last episode was about 1-2 hours ago.  No hematemesis or BRBPR.  He has also some complaints of back pain.    I did let her know that we did not administer any treatment yesterday for his [facility], and that his symptoms are unexpected after straightforward angiogram.    Advised them that he should be evaluated through the ED, as unfortunately our clinic is not currently open given pandemic situation.    She understands.    Signed,  Dulcy Fanny. Earleen Newport, DO

## 2018-11-01 ENCOUNTER — Other Ambulatory Visit: Payer: Self-pay | Admitting: Physician Assistant

## 2018-11-04 ENCOUNTER — Other Ambulatory Visit: Payer: Self-pay | Admitting: Radiology

## 2018-11-05 ENCOUNTER — Ambulatory Visit (HOSPITAL_COMMUNITY)
Admission: RE | Admit: 2018-11-05 | Discharge: 2018-11-05 | Disposition: A | Discharge status: Home / Self Care | Payer: Medicare Other | Source: Ambulatory Visit | Attending: Interventional Radiology | Admitting: Interventional Radiology

## 2018-11-05 ENCOUNTER — Encounter (HOSPITAL_COMMUNITY)
Admission: RE | Admit: 2018-11-05 | Discharge: 2018-11-05 | Disposition: A | Discharge status: Home / Self Care | Payer: Medicare Other | Source: Ambulatory Visit | Attending: Interventional Radiology | Admitting: Interventional Radiology

## 2018-11-05 ENCOUNTER — Encounter (HOSPITAL_COMMUNITY): Payer: Self-pay

## 2018-11-05 ENCOUNTER — Encounter (HOSPITAL_COMMUNITY)
Admission: RE | Admit: 2018-11-05 | Discharge: 2018-11-05 | Disposition: A | Discharge status: Home / Self Care | Payer: Medicare Other | Source: Ambulatory Visit | Attending: Oncology | Admitting: Oncology

## 2018-11-05 ENCOUNTER — Other Ambulatory Visit (HOSPITAL_COMMUNITY): Payer: Self-pay | Admitting: Interventional Radiology

## 2018-11-05 ENCOUNTER — Other Ambulatory Visit: Payer: Self-pay

## 2018-11-05 DIAGNOSIS — Z720 Tobacco use: Secondary | ICD-10-CM | POA: Insufficient documentation

## 2018-11-05 DIAGNOSIS — A419 Sepsis, unspecified organism: Secondary | ICD-10-CM | POA: Insufficient documentation

## 2018-11-05 DIAGNOSIS — Z79899 Other long term (current) drug therapy: Secondary | ICD-10-CM | POA: Insufficient documentation

## 2018-11-05 DIAGNOSIS — B1910 Unspecified viral hepatitis B without hepatic coma: Secondary | ICD-10-CM | POA: Insufficient documentation

## 2018-11-05 DIAGNOSIS — B1920 Unspecified viral hepatitis C without hepatic coma: Secondary | ICD-10-CM | POA: Insufficient documentation

## 2018-11-05 DIAGNOSIS — M545 Low back pain: Secondary | ICD-10-CM | POA: Diagnosis not present

## 2018-11-05 DIAGNOSIS — D649 Anemia, unspecified: Secondary | ICD-10-CM | POA: Insufficient documentation

## 2018-11-05 DIAGNOSIS — F1910 Other psychoactive substance abuse, uncomplicated: Secondary | ICD-10-CM | POA: Insufficient documentation

## 2018-11-05 DIAGNOSIS — K7460 Unspecified cirrhosis of liver: Secondary | ICD-10-CM | POA: Diagnosis not present

## 2018-11-05 DIAGNOSIS — C220 Liver cell carcinoma: Secondary | ICD-10-CM

## 2018-11-05 DIAGNOSIS — R5382 Chronic fatigue, unspecified: Secondary | ICD-10-CM | POA: Insufficient documentation

## 2018-11-05 DIAGNOSIS — D696 Thrombocytopenia, unspecified: Secondary | ICD-10-CM | POA: Insufficient documentation

## 2018-11-05 HISTORY — PX: IR EMBO TUMOR ORGAN ISCHEMIA INFARCT INC GUIDE ROADMAPPING: IMG5449

## 2018-11-05 HISTORY — PX: IR ANGIOGRAM VISCERAL SELECTIVE: IMG657

## 2018-11-05 HISTORY — PX: IR ANGIOGRAM SELECTIVE EACH ADDITIONAL VESSEL: IMG667

## 2018-11-05 HISTORY — PX: IR US GUIDE VASC ACCESS RIGHT: IMG2390

## 2018-11-05 LAB — CBC WITH DIFFERENTIAL/PLATELET
Abs Immature Granulocytes: 0.02 10*3/uL (ref 0.00–0.07)
Basophils Absolute: 0 10*3/uL (ref 0.0–0.1)
Basophils Relative: 0 %
Eosinophils Absolute: 0 10*3/uL (ref 0.0–0.5)
Eosinophils Relative: 1 %
HCT: 36 % — ABNORMAL LOW (ref 39.0–52.0)
Hemoglobin: 11.8 g/dL — ABNORMAL LOW (ref 13.0–17.0)
Immature Granulocytes: 0 %
Lymphocytes Relative: 28 %
Lymphs Abs: 1.3 10*3/uL (ref 0.7–4.0)
MCH: 32.5 pg (ref 26.0–34.0)
MCHC: 32.8 g/dL (ref 30.0–36.0)
MCV: 99.2 fL (ref 80.0–100.0)
Monocytes Absolute: 0.3 10*3/uL (ref 0.1–1.0)
Monocytes Relative: 7 %
Neutro Abs: 3 10*3/uL (ref 1.7–7.7)
Neutrophils Relative %: 64 %
Platelets: 89 10*3/uL — ABNORMAL LOW (ref 150–400)
RBC: 3.63 MIL/uL — ABNORMAL LOW (ref 4.22–5.81)
RDW: 16.8 % — ABNORMAL HIGH (ref 11.5–15.5)
WBC: 4.6 10*3/uL (ref 4.0–10.5)
nRBC: 0 % (ref 0.0–0.2)

## 2018-11-05 LAB — PROTIME-INR
INR: 1.2 (ref 0.8–1.2)
Prothrombin Time: 14.6 seconds (ref 11.4–15.2)

## 2018-11-05 LAB — COMPREHENSIVE METABOLIC PANEL
ALT: 33 U/L (ref 0–44)
AST: 200 U/L — ABNORMAL HIGH (ref 15–41)
Albumin: 2.7 g/dL — ABNORMAL LOW (ref 3.5–5.0)
Alkaline Phosphatase: 192 U/L — ABNORMAL HIGH (ref 38–126)
Anion gap: 8 (ref 5–15)
BUN: 15 mg/dL (ref 8–23)
CO2: 23 mmol/L (ref 22–32)
Calcium: 8.9 mg/dL (ref 8.9–10.3)
Chloride: 104 mmol/L (ref 98–111)
Creatinine, Ser: 1 mg/dL (ref 0.61–1.24)
GFR calc Af Amer: >60 mL/min (ref >60)
GFR calc non Af Amer: >60 mL/min (ref >60)
Glucose, Bld: 111 mg/dL — ABNORMAL HIGH (ref 70–99)
Potassium: 3.8 mmol/L (ref 3.5–5.1)
Sodium: 135 mmol/L (ref 135–145)
Total Bilirubin: 1.5 mg/dL — ABNORMAL HIGH (ref 0.3–1.2)
Total Protein: 9 g/dL — ABNORMAL HIGH (ref 6.5–8.1)

## 2018-11-05 MED ORDER — MIDAZOLAM HCL 2 MG/2ML IJ SOLN
INTRAMUSCULAR | Status: AC | PRN
  Administered 2018-11-05 (×2): 1 mg via INTRAVENOUS

## 2018-11-05 MED ORDER — FENTANYL CITRATE (PF) 100 MCG/2ML IJ SOLN
INTRAMUSCULAR | Status: AC
  Filled 2018-11-05: qty 2

## 2018-11-05 MED ORDER — FENTANYL CITRATE (PF) 100 MCG/2ML IJ SOLN
INTRAMUSCULAR | Status: AC | PRN
  Administered 2018-11-05 (×2): 50 ug via INTRAVENOUS

## 2018-11-05 MED ORDER — LEVOFLOXACIN 500 MG PO TABS: 500.0000 mg | ORAL_TABLET | Freq: Every day | ORAL | 0 refills | Status: AC

## 2018-11-05 MED ORDER — PANTOPRAZOLE SODIUM 40 MG IV SOLR
40.0000 mg | Freq: Once | INTRAVENOUS | Status: AC
  Administered 2018-11-05: 10:00:00 40 mg via INTRAVENOUS

## 2018-11-05 MED ORDER — SODIUM CHLORIDE 0.9 % IV SOLN: INTRAVENOUS | Status: DC

## 2018-11-05 MED ORDER — YTTRIUM 90 INJECTION: 23.3000 | INJECTION | Freq: Once | INTRAVENOUS | Status: DC | PRN

## 2018-11-05 MED ORDER — LIDOCAINE HCL (PF) 1 % IJ SOLN
INTRAMUSCULAR | Status: AC | PRN
  Administered 2018-11-05: 5 mL

## 2018-11-05 MED ORDER — DEXAMETHASONE SODIUM PHOSPHATE 10 MG/ML IJ SOLN
INTRAMUSCULAR | Status: AC
  Filled 2018-11-05: qty 1

## 2018-11-05 MED ORDER — PANTOPRAZOLE SODIUM 40 MG IV SOLR
INTRAVENOUS | Status: AC
  Filled 2018-11-05: qty 40

## 2018-11-05 MED ORDER — MIDAZOLAM HCL 2 MG/2ML IJ SOLN
INTRAMUSCULAR | Status: AC
  Filled 2018-11-05: qty 6

## 2018-11-05 MED ORDER — PANTOPRAZOLE SODIUM 40 MG PO TBEC: 40.0000 mg | DELAYED_RELEASE_TABLET | Freq: Every day | ORAL | 2 refills | Status: AC

## 2018-11-05 MED ORDER — SODIUM CHLORIDE 0.9 % IV SOLN
8.0000 mg | Freq: Once | INTRAVENOUS | Status: AC
  Administered 2018-11-05: 8 mg via INTRAVENOUS
  Filled 2018-11-05: qty 4

## 2018-11-05 MED ORDER — LIDOCAINE HCL 1 % IJ SOLN
INTRAMUSCULAR | Status: AC
  Filled 2018-11-05: qty 20

## 2018-11-05 MED ORDER — PIPERACILLIN-TAZOBACTAM 3.375 G IVPB 30 MIN
3.3750 g | Freq: Once | INTRAVENOUS | Status: AC
  Administered 2018-11-05: 3.375 g via INTRAVENOUS
  Filled 2018-11-05: qty 50

## 2018-11-05 MED ORDER — IOHEXOL 300 MG/ML  SOLN: 100.0000 mL | Freq: Once | INTRAMUSCULAR | Status: DC | PRN

## 2018-11-05 MED ORDER — DEXAMETHASONE SODIUM PHOSPHATE 10 MG/ML IJ SOLN
8.0000 mg | Freq: Once | INTRAMUSCULAR | Status: AC
  Administered 2018-11-05: 8 mg via INTRAVENOUS

## 2018-11-05 NOTE — Sedation Documentation (Signed)
Groin site dressing CDI, level 0.

## 2018-11-05 NOTE — Sedation Documentation (Signed)
Groin site clean dry intact, level 0

## 2018-11-05 NOTE — H&P (Signed)
Referring Physician(s): Finnegan,T  Supervising Physician: Corrie Mckusick  Patient Status:  [facility]  Chief Complaint: Cirrhosis, hepatitis C, multifocal hepatocellular carcinoma   Subjective: Patient familiar to IR service from prior right liver lesion biopsy on 09/12/18 as well as mesenteric/visceral/hepatic arteriogram with Y 90 test dosing on 10/17/18.  He has a known history of hepatitis C, cirrhosis, alcohol/IV drug abuse and progressive multifocal hepatocellular carcinoma and presents again today for Y-90 hepatic radioembolization.  He currently denies fever, headache, chest pain, dyspnea, cough, back pain, nausea, vomiting or bleeding.  He does have some intermittent abdominal discomfort.  Past Medical History:  Diagnosis Date   Blood dyscrasia    thrombocytopenia   Hepatitis C    Heroin use    Past Surgical History:  Procedure Laterality Date   IR ANGIOGRAM SELECTIVE EACH ADDITIONAL VESSEL  10/17/2018   IR ANGIOGRAM SELECTIVE EACH ADDITIONAL VESSEL  10/17/2018   IR ANGIOGRAM SELECTIVE EACH ADDITIONAL VESSEL  10/17/2018   IR ANGIOGRAM SELECTIVE EACH ADDITIONAL VESSEL  10/17/2018   IR ANGIOGRAM SELECTIVE EACH ADDITIONAL VESSEL  10/17/2018   IR ANGIOGRAM SELECTIVE EACH ADDITIONAL VESSEL  10/17/2018   IR ANGIOGRAM SELECTIVE EACH ADDITIONAL VESSEL  10/17/2018   IR ANGIOGRAM VISCERAL SELECTIVE  10/17/2018   IR ANGIOGRAM VISCERAL SELECTIVE  10/17/2018   IR EMBO ARTERIAL NOT HEMORR HEMANG INC GUIDE ROADMAPPING  10/17/2018   IR RADIOLOGIST EVAL & MGMT  05/22/2018   IR RADIOLOGIST EVAL & MGMT  08/27/2018   IR US GUIDE VASC ACCESS RIGHT  10/17/2018   KNEE SURGERY     TEE WITHOUT CARDIOVERSION N/A 04/22/2018   Procedure: TRANSESOPHAGEAL ECHOCARDIOGRAM (TEE);  Surgeon: Teodoro Spray, MD;  Location: [facility] ORS;  Service: Cardiovascular;  Laterality: N/A;       Allergies: Patient has no known allergies.  Medications: Prior to Admission medications   Medication Sig Start Date End  Date Taking? Authorizing Provider  fluconazole (DIFLUCAN) 200 MG tablet Take 600 mg by mouth daily.    [provider]  furosemide (LASIX) 20 MG tablet Take 1 tablet (20 mg total) by mouth daily. 07/29/18 10/28/18  Jonathon Bellows, MD  Multiple Vitamin (MULTIVITAMIN WITH MINERALS) TABS tablet Take 1 tablet by mouth daily. 05/03/18   Epifanio Lesches, MD  spironolactone (ALDACTONE) 50 MG tablet Take 1 tablet (50 mg total) by mouth daily. 07/29/18 10/17/18  Jonathon Bellows, MD     Vital Signs: Blood pressure 112/66, temperature 98.9, heart rate 57, respirations 18, O2 sat 100% room air   Physical Exam awake, alert.  Chest with distant breath sounds bilaterally.  Heart with slightly bradycardic but regular rhythm.  Abdomen soft,  positive bowel sounds, splenomegaly noted, currently nontender.  No lower extremity edema.  Imaging: No results found.  Labs:  CBC: Recent Labs    06/25/18 0951 08/06/18 1125 09/12/18 1023 10/17/18 0800  WBC 4.6 4.7 5.8 4.1  HGB 10.0* 11.9* 12.6* 12.0*  HCT 30.7* 36.8* 37.0* 37.4*  PLT 64* 77* 87* 72*    COAGS: Recent Labs    04/17/18 1233 06/25/18 0951 07/29/18 1441 09/12/18 1023 10/17/18 0800  INR 1.54 1.42 1.3* 1.2 1.3*  APTT 37*  --   --   --   --     BMP: Recent Labs    06/25/18 0951 07/29/18 1441 08/06/18 1125 10/17/18 0800  NA 140 142 141 137  K 3.3* 3.0* 3.1* 3.6  CL 108 106 106 107  CO2 27 23 28  21*  GLUCOSE 118* 101* 172*  102*  BUN 10 7* 11 11  CALCIUM 9.2 9.2 9.2 8.7*  CREATININE 0.71 0.71* 0.81 0.72  GFRNONAA >60 101 >60 >60  GFRAA >60 116 >60 >60    LIVER FUNCTION TESTS: Recent Labs    06/25/18 0951 07/29/18 1441 08/06/18 1125 10/17/18 0800  BILITOT 1.8* 1.4* 1.3* 1.8*  AST 64* 160* 67* 111*  ALT 20 35 21 19  ALKPHOS 158* 185* 162* 185*  PROT 8.3* 8.2 8.8* 8.5*  ALBUMIN 2.5* 3.0* 2.7* 2.5*    Assessment and Plan: Pt with history of hepatitis C, cirrhosis, alcohol/IV drug abuse and progressive multifocal  hepatocellular carcinoma ; presents today for Y-90 hepatic radioembolization. Risks and benefits of procedure were discussed with the patient including, but not limited to bleeding, infection, vascular injury or contrast induced renal failure.  This interventional procedure involves the use of X-rays and because of the nature of the planned procedure, it is possible that we will have prolonged use of X-ray fluoroscopy.  Potential radiation risks to you include (but are not limited to) the following: - A slightly elevated risk for cancer  several years later in life. This risk is typically less than 0.5% percent. This risk is low in comparison to the normal incidence of human cancer, which is 33% for women and 50% for men according to the [place]. - Radiation induced injury can include skin redness, resembling a rash, tissue breakdown / ulcers and hair loss (which can be temporary or permanent).   The likelihood of either of these occurring depends on the difficulty of the procedure and whether you are sensitive to radiation due to previous procedures, disease, or genetic conditions.   IF your procedure requires a prolonged use of radiation, you will be notified and given written instructions for further action.  It is your responsibility to monitor the irradiated area for the 2 weeks following the procedure and to notify your physician if you are concerned that you have suffered a radiation induced injury.    All of the patient's questions were answered, patient is agreeable to proceed.  Consent signed and in chart.  LABS PENDING    Electronically Signed: D. Rowe Robert, PA-C 11/05/2018, 8:47 AM   I spent a total of 20 minutes at the the patient's bedside AND on the patient's hospital floor or unit, greater than 50% of which was counseling/coordinating care for visceral /hepatic arteriogram with Y-90 hepatic radioembolization

## 2018-11-05 NOTE — Sedation Documentation (Signed)
Groin site level 0

## 2018-11-05 NOTE — Procedures (Signed)
Interventional Radiology Procedure Note  Procedure: US guided right CFA access for mesenteric angiogram, and treatment of HCC with y90 dose to the anterior segment of right liver & segment 4 arising from ant segment.   Angioseal deployed for hemostasis .  Complications: None  Recommendations:  - Right hip straight 2 hours - To NM for post bremsstrahlung scan. - Do not submerge for 7 days - Routine care - anticipate dc in 2 hours - Advance diet - Start 5 days of levaquin - PO protonix 40mg  daily, x 3 months - follow up with Dr. Earleen Newport in 4-6 weeks, consider contrast CT abdomen, repeat AFP, CMP   Signed,  Andrew Gibbs. Earleen Newport, DO

## 2018-11-05 NOTE — Discharge Instructions (Signed)
Post Y-90 Radioembolization Discharge Instructions  You have been given a radioactive material during your procedure.  While it is safe for you to be discharged home from the hospital, you need to proceed directly home.    Do not use public transportation, including air travel, lasting more than 2 hours for 1 week.  Avoid crowded public places for 1 week.  Adult visitors should try to avoid close contact with you for 1 week.    Children and pregnant females should not visit or have close contact with you for 1 week.  Items that you touch are not radioactive.  Do not sleep in the same bed as your partner for 1 week, and a condom should be used for sexual activity during the first 24 hours.  Your blood may be radioactive and caution should be used if any bleeding occurs during the recovery period.  Body fluids may be radioactive for 24 hours.  Wash your hands after voiding.  Men should sit to urinate.  Dispose of any soiled materials (flush down toilet or place in trash at home) during the first day.  Drink 6 to 8 glasses of fluids per day for 5 days to hydrate yourself.  If you need to see a doctor during the first week, you must let them know that you were treated with yttrium-90 microspheres, and will be slightly radioactive.  They can call Interventional Radiology [phone] with any questions.Moderate Conscious Sedation, Adult, Care After These instructions provide you with information about caring for yourself after your procedure. Your health care provider may also give you more specific instructions. Your treatment has been planned according to current medical practices, but problems sometimes occur. Call your health care provider if you have any problems or questions after your procedure. What can I expect after the procedure? After your procedure, it is common:  To feel sleepy for several hours.  To feel clumsy and have poor balance for several hours.  To have poor judgment for  several hours.  To vomit if you eat too soon. Follow these instructions at home: For at least 24 hours after the procedure:   Do not: ? Participate in activities where you could fall or become injured. ? Drive. ? Use heavy machinery. ? Drink alcohol. ? Take sleeping pills or medicines that cause drowsiness. ? Make important decisions or sign legal documents. ? Take care of children on your own.  Rest. Eating and drinking  Follow the diet recommended by your health care provider.  If you vomit: ? Drink water, juice, or soup when you can drink without vomiting. ? Make sure you have little or no nausea before eating solid foods. General instructions  Have a responsible adult stay with you until you are awake and alert.  Take over-the-counter and prescription medicines only as told by your health care provider.  If you smoke, do not smoke without supervision.  Keep all follow-up visits as told by your health care provider. This is important. Contact a health care provider if:  You keep feeling nauseous or you keep vomiting.  You feel light-headed.  You develop a rash.  You have a fever. Get help right away if:  You have trouble breathing. This information is not intended to replace advice given to you by your health care provider. Make sure you discuss any questions you have with your health care provider. Document Released: 04/23/2013 Document Revised: 12/06/2015 Document Reviewed: 10/23/2015 Elsevier Interactive Patient Education  2019 [employer].

## 2018-11-05 NOTE — Sedation Documentation (Signed)
To nuc med for study 

## 2018-11-06 LAB — AFP TUMOR MARKER: AFP, Serum, Tumor Marker: 33446 ng/mL — ABNORMAL HIGH (ref 0.0–8.3)

## 2018-11-06 LAB — CEA: CEA: 4.4 ng/mL (ref 0.0–4.7)

## 2018-11-11 NOTE — Progress Notes (Signed)
[place]  [phone] [phone]) [phone]  ID: GUNTER CONDE OB: [birth year]  MR#: [id number]  KNL#:[id number]  Patient Care Team: Center, [facility] as PCP - General (General Practice) Clent Jacks, RN as Registered Nurse  CHIEF COMPLAINT: Hepatocellular carcinoma.  INTERVAL HISTORY: Patient returns to clinic today for repeat laboratory work, further evaluation, and 1 week follow-up for his recent Y-90 ablation.  He had increased abdominal pain after his ablation, but this has subsided and he currently feels well.  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  He continues to have chronic weakness and fatigue.  He has no neurologic complaints.  He denies any recent fevers or illnesses.  He has a fair appetite, but denies weight loss.  He has no chest pain, cough, shortness of breath, or hemoptysis.  He has no nausea, vomiting, constipation, or diarrhea.  He has no urinary complaints.  Patient offers no further specific complaints today.  REVIEW OF SYSTEMS:   Review of Systems  Constitutional: Positive for malaise/fatigue. Negative for fever and weight loss.  Respiratory: Negative.  Negative for cough, hemoptysis and shortness of breath.   Cardiovascular: Negative.  Negative for chest pain and leg swelling.  Gastrointestinal: Negative.  Negative for abdominal pain, constipation and diarrhea.  Genitourinary: Negative.  Negative for dysuria.  Musculoskeletal: Negative.  Negative for back pain.  Skin: Negative.  Negative for rash.  Neurological: Positive for weakness. Negative for focal weakness and headaches.  Psychiatric/Behavioral: Negative.  Negative for substance abuse. The patient is not nervous/anxious.     As per HPI. Otherwise, a complete review of systems is negative.  PAST MEDICAL HISTORY: Past Medical History:  Diagnosis Date   Blood dyscrasia    thrombocytopenia   Hepatitis C    Heroin use     PAST  SURGICAL HISTORY: Past Surgical History:  Procedure Laterality Date   IR ANGIOGRAM SELECTIVE EACH ADDITIONAL VESSEL  10/17/2018   IR ANGIOGRAM SELECTIVE EACH ADDITIONAL VESSEL  10/17/2018   IR ANGIOGRAM SELECTIVE EACH ADDITIONAL VESSEL  10/17/2018   IR ANGIOGRAM SELECTIVE EACH ADDITIONAL VESSEL  10/17/2018   IR ANGIOGRAM SELECTIVE EACH ADDITIONAL VESSEL  10/17/2018   IR ANGIOGRAM SELECTIVE EACH ADDITIONAL VESSEL  10/17/2018   IR ANGIOGRAM SELECTIVE EACH ADDITIONAL VESSEL  10/17/2018   IR ANGIOGRAM SELECTIVE EACH ADDITIONAL VESSEL  11/05/2018   IR ANGIOGRAM SELECTIVE EACH ADDITIONAL VESSEL  11/05/2018   IR ANGIOGRAM SELECTIVE EACH ADDITIONAL VESSEL  11/05/2018   IR ANGIOGRAM VISCERAL SELECTIVE  10/17/2018   IR ANGIOGRAM VISCERAL SELECTIVE  10/17/2018   IR ANGIOGRAM VISCERAL SELECTIVE  11/05/2018   IR EMBO ARTERIAL NOT HEMORR HEMANG INC GUIDE ROADMAPPING  10/17/2018   IR EMBO TUMOR ORGAN ISCHEMIA INFARCT INC GUIDE ROADMAPPING  11/05/2018   IR RADIOLOGIST EVAL & MGMT  05/22/2018   IR RADIOLOGIST EVAL & MGMT  08/27/2018   IR US GUIDE VASC ACCESS RIGHT  10/17/2018   IR US GUIDE VASC ACCESS RIGHT  11/05/2018   KNEE SURGERY     TEE WITHOUT CARDIOVERSION N/A 04/22/2018   Procedure: TRANSESOPHAGEAL ECHOCARDIOGRAM (TEE);  Surgeon: Teodoro Spray, MD;  Location: [facility] ORS;  Service: Cardiovascular;  Laterality: N/A;    FAMILY HISTORY: Family History  Problem Relation Age of Onset   Multiple myeloma Mother     ADVANCED DIRECTIVES (Y/N):  N  HEALTH MAINTENANCE: Social History   Tobacco Use   Smoking status: Current Every Day Smoker    Packs/day: 0.50  Years: 40.00    Pack years: 20.00   Smokeless tobacco: Never Used  Substance Use Topics   Alcohol use: Not Currently    Comment: 40 oz today   Drug use: Not Currently    Types: IV, Cocaine, Heroin, Marijuana    Comment: heroin, states he used to use cocaine     Colonoscopy:  PAP:  Bone density:  Lipid panel:  No Known  Allergies  Current Outpatient Medications  Medication Sig Dispense Refill   furosemide (LASIX) 20 MG tablet Take 1 tablet (20 mg total) by mouth daily. 90 tablet 1   Multiple Vitamin (MULTIVITAMIN WITH MINERALS) TABS tablet Take 1 tablet by mouth daily. 30 tablet 0   pantoprazole (PROTONIX) 40 MG tablet Take 1 tablet (40 mg total) by mouth daily. 30 tablet 2   spironolactone (ALDACTONE) 50 MG tablet Take 1 tablet (50 mg total) by mouth daily. 60 tablet 0   fluconazole (DIFLUCAN) 200 MG tablet Take 600 mg by mouth daily.     No current facility-administered medications for this visit.     OBJECTIVE: Vitals:   11/12/18 1047  BP: 104/69  Pulse: 81  Temp: 98.7 F (37.1 C)     Body mass index is 22.08 kg/m.    ECOG FS:0 - Asymptomatic  General: Thin, no acute distress. Eyes: Pink conjunctiva, anicteric sclera. HEENT: Normocephalic, moist mucous membranes, clear oropharnyx. Lungs: Clear to auscultation bilaterally. Heart: Regular rate and rhythm. No rubs, murmurs, or gallops. Abdomen: Soft, nontender, nondistended. No organomegaly noted, normoactive bowel sounds. Musculoskeletal: No edema, cyanosis, or clubbing. Neuro: Alert, answering all questions appropriately. Cranial nerves grossly intact. Skin: No rashes or petechiae noted. Psych: Normal affect.  LAB RESULTS:  Lab Results  Component Value Date   NA 131 (L) 11/12/2018   K 4.5 11/12/2018   CL 100 11/12/2018   CO2 22 11/12/2018   GLUCOSE 109 (H) 11/12/2018   BUN 20 11/12/2018   CREATININE 0.90 11/12/2018   CALCIUM 9.0 11/12/2018   PROT 9.5 (H) 11/12/2018   ALBUMIN 2.9 (L) 11/12/2018   AST 196 (H) 11/12/2018   ALT 62 (H) 11/12/2018   ALKPHOS 179 (H) 11/12/2018   BILITOT 2.8 (H) 11/12/2018   GFRNONAA >60 11/12/2018   GFRAA >60 11/12/2018    Lab Results  Component Value Date   WBC 4.9 11/12/2018   NEUTROABS 3.6 11/12/2018   HGB 13.2 11/12/2018   HCT 40.5 11/12/2018   MCV 96.0 11/12/2018   PLT 107 (L)  11/12/2018     STUDIES: Ct Chest Wo Contrast  Result Date: 10/16/2018 CLINICAL DATA:  Cirrhosis. Hepatocellular carcinoma diagnosed October 2019. Elevated AFP. Chest staging. Current smoker. EXAM: CT CHEST WITHOUT CONTRAST TECHNIQUE: Multidetector CT imaging of the chest was performed following the standard protocol without IV contrast. COMPARISON:  08/27/2018 CT angiogram of the abdomen and pelvis. 04/20/2018 MRI abdomen. 02/13/2018 chest CT. FINDINGS: Cardiovascular: Normal heart size. No significant pericardial effusion/thickening. Three-vessel coronary atherosclerosis. Atherosclerotic nonaneurysmal thoracic aorta. Normal caliber pulmonary arteries. Mediastinum/Nodes: No discrete thyroid nodules. Small lower thoracic esophageal varices, stable. No pathologically enlarged axillary, mediastinal or hilar lymph nodes, noting limited sensitivity for the detection of hilar adenopathy on this noncontrast study. Few tiny calcified nonenlarged right paratracheal and right hilar nodes from prior granulomatous disease, unchanged. Lungs/Pleura: No pneumothorax. No pleural effusion. Moderate bullous and paraseptal emphysema at the lung apices. No acute consolidative airspace disease, lung masses or significant pulmonary nodules. Upper abdomen: Markedly irregular liver surface compatible with hepatic cirrhosis. Partial visualization of  porcelain gallbladder. Partial visualization moderate splenomegaly. Trace perihepatic ascites. Musculoskeletal: No aggressive appearing focal osseous lesions. Symmetric mild gynecomastia, stable. Minimal thoracic spondylosis. IMPRESSION: 1. No evidence of metastatic disease in the chest. 2. Hepatic cirrhosis. Known liver masses not well visualized on this noncontrast chest CT study. Trace perihepatic ascites. Moderate splenomegaly. Small gastroesophageal varices. 3. Three-vessel coronary atherosclerosis. Aortic Atherosclerosis (ICD10-I70.0) and Emphysema (ICD10-J43.9). Electronically  Signed   By: Ilona Sorrel M.D.   On: 10/16/2018 15:13   Nm Liver Tumor Loc Imflam Spect 1 Day  Result Date: 11/05/2018 CLINICAL DATA:  Hepatocellular carcinoma unresectable. Cirrhosis. Radioembolization the RIGHT hepatic lobe with segment 5/8 segmentectomy. EXAM: NUCLEAR MEDICINE SPECIAL MED RAD PHYSICS CONS; NUCLEAR MEDICINE RADIO PHARM THERAPY INTRA ARTERIAL; NUCLEAR MEDICINE TREATMENT PROCEDURE; NUCLEAR MEDICINE LIVER SCAN TECHNIQUE: In conjunction with the interventional radiologist a Y- Microsphere dose was calculated utilizing body surface area formulation. Calculated dose equal 23.2 mCi. Pre therapy MAA liver SPECT scan and CTA were evaluated. Planned segment 5/8 segmentectomy with high (2x) flex dose to the treatment segment. Utilizing a microcatheter system, the segmental RIGHT hepatic artery was selected and Y-90 microspheres were delivered in fractionated aliquots. Radiopharmaceutical was delivered by the interventional radiologist and nuclear radiologist. The patient tolerated procedure well. No adverse effects were noted. Bremsstrahlung planar and SPECT imaging of the abdomen following intrahepatic arterial delivery of Y-90 microsphere was performed. RADIOPHARMACEUTICALS:  16.1 millicuries 096 microspheres. COMPARISON:  MAA 10/17/2018, angiography 10/17/2018, CT 08/27/2018 FINDINGS: Y - 90 microspheres therapy as above. First therapy the right hepatic lobe. Bremsstrahlung planar and SPECT imaging of the abdomen following intrahepatic arterial delivery of Y-27mcrosphere demonstrates radioactivity localized to the RIGHT hepatic lobe. No evidence of extrahepatic activity. IMPRESSION: Successful Y - 90 microsphere delivery for treatment of unresectable liver metastasis. First therapy to the RIGHT lobe. Segment 5/8 segmentectomy. Bremssstrahlung scan demonstrates activity localized to RIGHT hepatic lobe with no extrahepatic activity identified. Electronically Signed   By: SSuzy BouchardM.D.   On:  11/05/2018 12:35   Nm Liver Tumor Loc Imflam Spect 1 Day  Result Date: 10/17/2018 CLINICAL DATA:  Hepatocellular carcinoma. Radioembolization pre therapy evaluation EXAM: NUCLEAR MEDICINE LIVER SCAN; ULTRASOUND MISCELLANEOUS SOFT TISSUE TECHNIQUE: Abdominal images were obtained in multiple projections after intrahepatic arterial injection of radiopharmaceutical. SPECT imaging was performed. Lung shunt calculation was performed. RADIOPHARMACEUTICALS:  5.4 millicuries technetium 99 MAA COMPARISON:  None. FINDINGS: The injected microaggregated albumin localizes within the RIGHT hepatic lobe. No evidence of activity within the stomach, duodenum, or bowel. Calculated shunt fraction to the lungs equals 5.7%. IMPRESSION: 1. No significant extrahepatic radiotracer activity following intrahepatic arterial injection of MAA. 2. Lung shunt fraction equals 5.7% Electronically Signed   By: SSuzy BouchardM.D.   On: 10/17/2018 16:49   Ir Angiogram Visceral Selective  Result Date: 11/05/2018 INDICATION: 63year old male with a history of hepatocellular carcinoma. He presents today for first treatment of the right liver EXAM: ULTRASOUND-GUIDED COMMON FEMORAL ARTERY ACCESS MESENTERIC ANGIOGRAM DEPLOYMENT OF FIRST TREATMENT Y 90 INTO ANTERIOR SEGMENT OF THE RIGHT HEPATIC ARTERY ANGIO-SEAL DEPLOYMENT FOR HEMOSTASIS MEDICATIONS: 3.375 Zosyn IV. The antibiotic was administered within 1 hour of the procedure ANESTHESIA/SEDATION: Moderate (conscious) sedation was employed during this procedure. A total of Versed 2.0 mg and Fentanyl 100 mcg was administered intravenously. Moderate Sedation Time: 50 minutes. The patient's level of consciousness and vital signs were monitored continuously by radiology nursing throughout the procedure under my direct supervision. CONTRAST:  52 cc Omni 300 FLUOROSCOPY TIME:  Fluoroscopy Time: 3 minutes 54  seconds (123 mGy). COMPLICATIONS: None PROCEDURE: Informed consent was obtained from the patient  following explanation of the procedure, risks, benefits and alternatives. The patient understands, agrees and consents for the procedure. All questions were addressed. A time out was performed prior to the initiation of the procedure. Maximal barrier sterile technique utilized including caps, mask, sterile gowns, sterile gloves, large sterile drape, hand hygiene, and Betadine prep. Ultrasound survey of the right inguinal region was performed with images stored and sent to PACs, confirming patency of the vessel. 1% lidocaine was used for local anesthesia. Stab incision was made with 11 blade scalpel. Blunt dissection was performed with ultrasound guidance. A micropuncture needle was used access the right common femoral artery under ultrasound. With excellent arterial blood flow returned, and an .018 micro wire was passed through the needle, observed enter the abdominal aorta under fluoroscopy. The needle was removed, and a micropuncture sheath was placed over the wire. The inner dilator and wire were removed, and an 035 Bentson wire was advanced under fluoroscopy into the abdominal aorta. The sheath was removed and a standard 5 [place] vascular sheath was placed. The dilator was removed and the sheath was flushed. Standard C2 Cobra catheter advanced on the Bentson wire to the abdominal aorta. Catheter was used to select the celiac artery origin. Angiogram was performed. Glidewire was advanced, and the catheter was used to select the proper hepatic artery. Angiogram was performed. Catheter was withdrawn to the common hepatic artery. Angiogram was performed. Microcatheter was then advanced into the right hepatic artery for dedicated angiogram of right hepatic artery and the anterior segment of the right hepatic artery. Once we confirmed the catheter position within the anterior segment of the right hepatic artery, the team called for the Y 90 dose. Y 90 dose was then administered with the assistance of Dr. Leonia Reeves. All  catheters and wires were disposed properly. Angio-Seal was then deployed for hemostasis at the right common femoral artery access. Patient tolerated the procedure well and remained hemodynamically stable throughout. No complications were encountered and no significant blood loss. IMPRESSION: Status post ultrasound guided access right common femoral artery for mesenteric angiogram and treatment with Y 90 to the infiltrative and focal [place] tumor of the anterior right liver. Status post Angio-Seal for hemostasis. Signed, Dulcy Fanny. Dellia Nims, RPVI [facility] Radiology Electronically Signed   By: Corrie Mckusick D.O.   On: 11/05/2018 12:29   Ir Angiogram Visceral Selective  Result Date: 10/17/2018 INDICATION: 63 year old male with hepatocellular carcinoma presents for Y 90 mapping study. He has a focal tumor in segment 5 with typical imaging characteristics and a more infiltrative tumor throughout the right liver which is biopsy proven to be HCC. Additional hyperenhancing foci of the left liver are nonspecific. AFP greater than 11,000 and rising EXAM: ULTRASOUND GUIDED ACCESS RIGHT COMMON FEMORAL ARTERY MESENTERIC ANGIOGRAM, TARGETING CELIAC ARTERY, SUPERIOR MESENTERIC ARTERY, AND SELECTIVE IN SUPRA SELECTIVE ANGIOGRAM OF HEPATIC ARTERIAL VASCULATURE. EMPIRIC COIL EMBOLIZATION OF GASTRODUODENAL ARTERY AS A PROTECTIVE MEASURE FOR FUTURE Y 90 MAA DOSE FOR FUTURE PLANNING DEPLOYMENT OF EXOSEAL FOR HEMOSTASIS MEDICATIONS: None ANESTHESIA/SEDATION: Moderate (conscious) sedation was employed during this procedure. A total of Versed 6.0 mg and Fentanyl 100 mcg was administered intravenously. Moderate Sedation Time: 89 minutes. The patient's level of consciousness and vital signs were monitored continuously by radiology nursing throughout the procedure under my direct supervision. CONTRAST:  163 cc Omni 300 FLUOROSCOPY TIME:  Fluoroscopy Time: 18 minutes 12 seconds (2,732 mGy).  COMPLICATIONS: None PROCEDURE: Informed consent was obtained from the patient following explanation of the procedure, risks, benefits and alternatives. The patient understands, agrees and consents for the procedure. All questions were addressed. A time out was performed prior to the initiation of the procedure. Maximal barrier sterile technique utilized including caps, mask, sterile gowns, sterile gloves, large sterile drape, hand hygiene, and Betadine prep. Ultrasound survey of the right inguinal region was performed with images stored and sent to PACs, confirming patency of the vessel. A micropuncture needle was used access the right common femoral artery under ultrasound. With excellent arterial blood flow returned, and an .018 micro wire was passed through the needle, observed enter the abdominal aorta under fluoroscopy. The needle was removed, and a micropuncture sheath was placed over the wire. The inner dilator and wire were removed, and an 035 Bentson wire was advanced under fluoroscopy into the abdominal aorta. The sheath was removed and a standard 5 [place] vascular sheath was placed. The dilator was removed and the sheath was flushed. A Bentson wire was used to introduce a standard C2 Cobra catheter. Cobra catheter was used to select the superior mesenteric artery. Angiogram was performed. Catheter was then advanced to select the celiac artery. Angiogram was performed. Glidewire was then used to navigate the base catheter into the common hepatic artery. Common hepatic artery angiogram was performed. High-flow Progreat catheter was then advanced on an 014 microwire into the right hepatic artery. Angiogram was performed. Catheter was then used to select the anterior and posterior divisions of the right hepatic artery, with super selective angiogram performed of the segment 5 branches, in an attempt to angiographically dissect the arterial anatomy of the segment 5 tumor. Multiple angiogram with multiple  projections were performed. During this angiographic dissection, the C2 base catheter buckled into the aorta, with loss of catheter position. Base catheter was then returned to the common hepatic artery with the use of a Glidewire. STC microcatheter was then advanced through the base catheter for selection of the gastroduodenal artery. Angiogram was performed. Empiric coil embolization was then performed of the gastroduodenal artery with a combination of 4 mm diameter, C5 mm diameter, and 6 mm diameter coils to the origin. Repeat angiogram was performed. STC coil was then used to select the left hepatic artery. Angiogram was performed. Microcatheter was then returned to the proximal right hepatic artery, just beyond the origin of the left hepatic artery, with angiogram confirming location. This was the site of the MAA dose administration. Base catheter microcatheter with then withdrawn, carefully this posing into the proper for waist receptive coal. Chlorhexidine was used to clean the skin, 8 cc of lidocaine were administered to the access site, and EXOSEAL was deployed. Patient tolerated the procedure well and remained hemodynamically stable throughout. No complications were encountered and no significant blood loss. FINDINGS: No significant atherosclerotic changes. SMA patent, with no replaced right hepatic artery or contribution to the ARDS the liver. Portal vein patent. Celiac artery demonstrates standard anatomy, contributing to left gastric artery, splenic artery, common hepatic artery. A very proximal right phrenic artery originates from the origin, which does not appear to significantly supplied tumor. Pancreatic magna arises from the celiac artery. Common hepatic artery injection demonstrates no supra duodenal, retroduodenal, or right gastric artery. Traditional arrangement of the gastroduodenal artery, with pancreaticoduodenal arcade and gastroepiploic artery. Proper hepatic artery angiogram and right  hepatic artery angiogram demonstrate proximal division to the anterior and posterior, with a questionable extrahepatic branch originating near the bifurcation of  anterior and posterior segments (series 8 and 10.) this runs parallel to the expected course of cystic artery, and may represent cystic artery. Series of injection of super selective arteries from the segment 5 and anterior division of the right hepatic vasculature confirms significant contribution to the segment 5 tumor. No early venous shunting was identified. Tortuous anatomy at this location contributed to loss of base catheter position and loss of microcatheter position. Gastroduodenal artery injection demonstrates no right gastric artery, with patent gastroepiploic artery and pancreaticoduodenal arcade. Coil embolization was performed to the origin. Left hepatic artery injection demonstrates no extrahepatic branches with no significant contribution to the right-sided segment 5 tumor. There is a mottled appearance of the left liver parenchyma not appreciated on the prior CT exam. No venous shunting identified. The MAA dose was administered just beyond the left hepatic artery origin within the right hepatic artery. IMPRESSION: Status post ultrasound guided access right common femoral artery for mesenteric angiogram, empiric coil embolization of the GDA, as pre Y 90 mapping study for progressive multifocal HCC. Note that 1 single questionable vessel identified on the right hepatic angiogram which may represent cystic artery versus extrahepatic/supra duodenal branch, and may need interrogation on treatment day. Status post Exoseal deployment. Signed, Dulcy Fanny. Dellia Nims, RPVI [facility] Radiology Electronically Signed   By: Corrie Mckusick D.O.   On: 10/17/2018 12:50   Ir Angiogram Visceral Selective  Result Date: 10/17/2018 INDICATION: 63 year old male with hepatocellular carcinoma presents for Y 90  mapping study. He has a focal tumor in segment 5 with typical imaging characteristics and a more infiltrative tumor throughout the right liver which is biopsy proven to be HCC. Additional hyperenhancing foci of the left liver are nonspecific. AFP greater than 11,000 and rising EXAM: ULTRASOUND GUIDED ACCESS RIGHT COMMON FEMORAL ARTERY MESENTERIC ANGIOGRAM, TARGETING CELIAC ARTERY, SUPERIOR MESENTERIC ARTERY, AND SELECTIVE IN SUPRA SELECTIVE ANGIOGRAM OF HEPATIC ARTERIAL VASCULATURE. EMPIRIC COIL EMBOLIZATION OF GASTRODUODENAL ARTERY AS A PROTECTIVE MEASURE FOR FUTURE Y 90 MAA DOSE FOR FUTURE PLANNING DEPLOYMENT OF EXOSEAL FOR HEMOSTASIS MEDICATIONS: None ANESTHESIA/SEDATION: Moderate (conscious) sedation was employed during this procedure. A total of Versed 6.0 mg and Fentanyl 100 mcg was administered intravenously. Moderate Sedation Time: 89 minutes. The patient's level of consciousness and vital signs were monitored continuously by radiology nursing throughout the procedure under my direct supervision. CONTRAST:  163 cc Omni 300 FLUOROSCOPY TIME:  Fluoroscopy Time: 18 minutes 12 seconds (2,732 mGy). COMPLICATIONS: None PROCEDURE: Informed consent was obtained from the patient following explanation of the procedure, risks, benefits and alternatives. The patient understands, agrees and consents for the procedure. All questions were addressed. A time out was performed prior to the initiation of the procedure. Maximal barrier sterile technique utilized including caps, mask, sterile gowns, sterile gloves, large sterile drape, hand hygiene, and Betadine prep. Ultrasound survey of the right inguinal region was performed with images stored and sent to PACs, confirming patency of the vessel. A micropuncture needle was used access the right common femoral artery under ultrasound. With excellent arterial blood flow returned, and an .018 micro wire was passed through the needle, observed enter the abdominal aorta under  fluoroscopy. The needle was removed, and a micropuncture sheath was placed over the wire. The inner dilator and wire were removed, and an 035 Bentson wire was advanced under fluoroscopy into the abdominal aorta. The sheath was removed and a standard 5 [place] vascular sheath was placed. The dilator was removed and the sheath was flushed. A  Bentson wire was used to introduce a standard C2 Cobra catheter. Cobra catheter was used to select the superior mesenteric artery. Angiogram was performed. Catheter was then advanced to select the celiac artery. Angiogram was performed. Glidewire was then used to navigate the base catheter into the common hepatic artery. Common hepatic artery angiogram was performed. High-flow Progreat catheter was then advanced on an 014 microwire into the right hepatic artery. Angiogram was performed. Catheter was then used to select the anterior and posterior divisions of the right hepatic artery, with super selective angiogram performed of the segment 5 branches, in an attempt to angiographically dissect the arterial anatomy of the segment 5 tumor. Multiple angiogram with multiple projections were performed. During this angiographic dissection, the C2 base catheter buckled into the aorta, with loss of catheter position. Base catheter was then returned to the common hepatic artery with the use of a Glidewire. STC microcatheter was then advanced through the base catheter for selection of the gastroduodenal artery. Angiogram was performed. Empiric coil embolization was then performed of the gastroduodenal artery with a combination of 4 mm diameter, C5 mm diameter, and 6 mm diameter coils to the origin. Repeat angiogram was performed. STC coil was then used to select the left hepatic artery. Angiogram was performed. Microcatheter was then returned to the proximal right hepatic artery, just beyond the origin of the left hepatic artery, with angiogram confirming location. This was the site of the MAA  dose administration. Base catheter microcatheter with then withdrawn, carefully this posing into the proper for waist receptive coal. Chlorhexidine was used to clean the skin, 8 cc of lidocaine were administered to the access site, and EXOSEAL was deployed. Patient tolerated the procedure well and remained hemodynamically stable throughout. No complications were encountered and no significant blood loss. FINDINGS: No significant atherosclerotic changes. SMA patent, with no replaced right hepatic artery or contribution to the ARDS the liver. Portal vein patent. Celiac artery demonstrates standard anatomy, contributing to left gastric artery, splenic artery, common hepatic artery. A very proximal right phrenic artery originates from the origin, which does not appear to significantly supplied tumor. Pancreatic magna arises from the celiac artery. Common hepatic artery injection demonstrates no supra duodenal, retroduodenal, or right gastric artery. Traditional arrangement of the gastroduodenal artery, with pancreaticoduodenal arcade and gastroepiploic artery. Proper hepatic artery angiogram and right hepatic artery angiogram demonstrate proximal division to the anterior and posterior, with a questionable extrahepatic branch originating near the bifurcation of anterior and posterior segments (series 8 and 10.) this runs parallel to the expected course of cystic artery, and may represent cystic artery. Series of injection of super selective arteries from the segment 5 and anterior division of the right hepatic vasculature confirms significant contribution to the segment 5 tumor. No early venous shunting was identified. Tortuous anatomy at this location contributed to loss of base catheter position and loss of microcatheter position. Gastroduodenal artery injection demonstrates no right gastric artery, with patent gastroepiploic artery and pancreaticoduodenal arcade. Coil embolization was performed to the origin. Left  hepatic artery injection demonstrates no extrahepatic branches with no significant contribution to the right-sided segment 5 tumor. There is a mottled appearance of the left liver parenchyma not appreciated on the prior CT exam. No venous shunting identified. The MAA dose was administered just beyond the left hepatic artery origin within the right hepatic artery. IMPRESSION: Status post ultrasound guided access right common femoral artery for mesenteric angiogram, empiric coil embolization of the GDA, as pre Y 90 mapping study for  progressive multifocal HCC. Note that 1 single questionable vessel identified on the right hepatic angiogram which may represent cystic artery versus extrahepatic/supra duodenal branch, and may need interrogation on treatment day. Status post Exoseal deployment. Signed, Dulcy Fanny. Dellia Nims, RPVI Vascular and Interventional Radiology Specialists [facility] Radiology Electronically Signed   By: Corrie Mckusick D.O.   On: 10/17/2018 12:50   Ir Angiogram Selective Each Additional Vessel  Result Date: 11/05/2018 INDICATION: 63 year old male with a history of hepatocellular carcinoma. He presents today for first treatment of the right liver EXAM: ULTRASOUND-GUIDED COMMON FEMORAL ARTERY ACCESS MESENTERIC ANGIOGRAM DEPLOYMENT OF FIRST TREATMENT Y 90 INTO ANTERIOR SEGMENT OF THE RIGHT HEPATIC ARTERY ANGIO-SEAL DEPLOYMENT FOR HEMOSTASIS MEDICATIONS: 3.375 Zosyn IV. The antibiotic was administered within 1 hour of the procedure ANESTHESIA/SEDATION: Moderate (conscious) sedation was employed during this procedure. A total of Versed 2.0 mg and Fentanyl 100 mcg was administered intravenously. Moderate Sedation Time: 50 minutes. The patient's level of consciousness and vital signs were monitored continuously by radiology nursing throughout the procedure under my direct supervision. CONTRAST:  52 cc Omni 300 FLUOROSCOPY TIME:  Fluoroscopy Time: 3 minutes 54 seconds (123 mGy). COMPLICATIONS: None  PROCEDURE: Informed consent was obtained from the patient following explanation of the procedure, risks, benefits and alternatives. The patient understands, agrees and consents for the procedure. All questions were addressed. A time out was performed prior to the initiation of the procedure. Maximal barrier sterile technique utilized including caps, mask, sterile gowns, sterile gloves, large sterile drape, hand hygiene, and Betadine prep. Ultrasound survey of the right inguinal region was performed with images stored and sent to PACs, confirming patency of the vessel. 1% lidocaine was used for local anesthesia. Stab incision was made with 11 blade scalpel. Blunt dissection was performed with ultrasound guidance. A micropuncture needle was used access the right common femoral artery under ultrasound. With excellent arterial blood flow returned, and an .018 micro wire was passed through the needle, observed enter the abdominal aorta under fluoroscopy. The needle was removed, and a micropuncture sheath was placed over the wire. The inner dilator and wire were removed, and an 035 Bentson wire was advanced under fluoroscopy into the abdominal aorta. The sheath was removed and a standard 5 [place] vascular sheath was placed. The dilator was removed and the sheath was flushed. Standard C2 Cobra catheter advanced on the Bentson wire to the abdominal aorta. Catheter was used to select the celiac artery origin. Angiogram was performed. Glidewire was advanced, and the catheter was used to select the proper hepatic artery. Angiogram was performed. Catheter was withdrawn to the common hepatic artery. Angiogram was performed. Microcatheter was then advanced into the right hepatic artery for dedicated angiogram of right hepatic artery and the anterior segment of the right hepatic artery. Once we confirmed the catheter position within the anterior segment of the right hepatic artery, the team called for the Y 90 dose. Y 90 dose was  then administered with the assistance of Dr. Leonia Reeves. All catheters and wires were disposed properly. Angio-Seal was then deployed for hemostasis at the right common femoral artery access. Patient tolerated the procedure well and remained hemodynamically stable throughout. No complications were encountered and no significant blood loss. IMPRESSION: Status post ultrasound guided access right common femoral artery for mesenteric angiogram and treatment with Y 90 to the infiltrative and focal [place] tumor of the anterior right liver. Status post Angio-Seal for hemostasis. Signed, Dulcy Fanny. Dellia Nims, [place] Vascular and Interventional Radiology Specialists [facility] Radiology Electronically Signed  By: Corrie Mckusick D.O.   On: 11/05/2018 12:29   Ir Angiogram Selective Each Additional Vessel  Result Date: 11/05/2018 INDICATION: 63 year old male with a history of hepatocellular carcinoma. He presents today for first treatment of the right liver EXAM: ULTRASOUND-GUIDED COMMON FEMORAL ARTERY ACCESS MESENTERIC ANGIOGRAM DEPLOYMENT OF FIRST TREATMENT Y 90 INTO ANTERIOR SEGMENT OF THE RIGHT HEPATIC ARTERY ANGIO-SEAL DEPLOYMENT FOR HEMOSTASIS MEDICATIONS: 3.375 Zosyn IV. The antibiotic was administered within 1 hour of the procedure ANESTHESIA/SEDATION: Moderate (conscious) sedation was employed during this procedure. A total of Versed 2.0 mg and Fentanyl 100 mcg was administered intravenously. Moderate Sedation Time: 50 minutes. The patient's level of consciousness and vital signs were monitored continuously by radiology nursing throughout the procedure under my direct supervision. CONTRAST:  52 cc Omni 300 FLUOROSCOPY TIME:  Fluoroscopy Time: 3 minutes 54 seconds (123 mGy). COMPLICATIONS: None PROCEDURE: Informed consent was obtained from the patient following explanation of the procedure, risks, benefits and alternatives. The patient understands, agrees and consents for the procedure. All questions were addressed. A time  out was performed prior to the initiation of the procedure. Maximal barrier sterile technique utilized including caps, mask, sterile gowns, sterile gloves, large sterile drape, hand hygiene, and Betadine prep. Ultrasound survey of the right inguinal region was performed with images stored and sent to PACs, confirming patency of the vessel. 1% lidocaine was used for local anesthesia. Stab incision was made with 11 blade scalpel. Blunt dissection was performed with ultrasound guidance. A micropuncture needle was used access the right common femoral artery under ultrasound. With excellent arterial blood flow returned, and an .018 micro wire was passed through the needle, observed enter the abdominal aorta under fluoroscopy. The needle was removed, and a micropuncture sheath was placed over the wire. The inner dilator and wire were removed, and an 035 Bentson wire was advanced under fluoroscopy into the abdominal aorta. The sheath was removed and a standard 5 [place] vascular sheath was placed. The dilator was removed and the sheath was flushed. Standard C2 Cobra catheter advanced on the Bentson wire to the abdominal aorta. Catheter was used to select the celiac artery origin. Angiogram was performed. Glidewire was advanced, and the catheter was used to select the proper hepatic artery. Angiogram was performed. Catheter was withdrawn to the common hepatic artery. Angiogram was performed. Microcatheter was then advanced into the right hepatic artery for dedicated angiogram of right hepatic artery and the anterior segment of the right hepatic artery. Once we confirmed the catheter position within the anterior segment of the right hepatic artery, the team called for the Y 90 dose. Y 90 dose was then administered with the assistance of Dr. Leonia Reeves. All catheters and wires were disposed properly. Angio-Seal was then deployed for hemostasis at the right common femoral artery access. Patient tolerated the procedure well and  remained hemodynamically stable throughout. No complications were encountered and no significant blood loss. IMPRESSION: Status post ultrasound guided access right common femoral artery for mesenteric angiogram and treatment with Y 90 to the infiltrative and focal [place] tumor of the anterior right liver. Status post Angio-Seal for hemostasis. Signed, Dulcy Fanny. Dellia Nims, RPVI Vascular and Interventional Radiology Specialists [facility] Radiology Electronically Signed   By: Corrie Mckusick D.O.   On: 11/05/2018 12:29   Ir Angiogram Selective Each Additional Vessel  Result Date: 11/05/2018 INDICATION: 63 year old male with a history of hepatocellular carcinoma. He presents today for first treatment of the right liver EXAM: ULTRASOUND-GUIDED COMMON FEMORAL ARTERY ACCESS MESENTERIC ANGIOGRAM DEPLOYMENT  OF FIRST TREATMENT Y 90 INTO ANTERIOR SEGMENT OF THE RIGHT HEPATIC ARTERY ANGIO-SEAL DEPLOYMENT FOR HEMOSTASIS MEDICATIONS: 3.375 Zosyn IV. The antibiotic was administered within 1 hour of the procedure ANESTHESIA/SEDATION: Moderate (conscious) sedation was employed during this procedure. A total of Versed 2.0 mg and Fentanyl 100 mcg was administered intravenously. Moderate Sedation Time: 50 minutes. The patient's level of consciousness and vital signs were monitored continuously by radiology nursing throughout the procedure under my direct supervision. CONTRAST:  52 cc Omni 300 FLUOROSCOPY TIME:  Fluoroscopy Time: 3 minutes 54 seconds (123 mGy). COMPLICATIONS: None PROCEDURE: Informed consent was obtained from the patient following explanation of the procedure, risks, benefits and alternatives. The patient understands, agrees and consents for the procedure. All questions were addressed. A time out was performed prior to the initiation of the procedure. Maximal barrier sterile technique utilized including caps, mask, sterile gowns, sterile gloves, large sterile drape, hand hygiene, and Betadine prep. Ultrasound survey of  the right inguinal region was performed with images stored and sent to PACs, confirming patency of the vessel. 1% lidocaine was used for local anesthesia. Stab incision was made with 11 blade scalpel. Blunt dissection was performed with ultrasound guidance. A micropuncture needle was used access the right common femoral artery under ultrasound. With excellent arterial blood flow returned, and an .018 micro wire was passed through the needle, observed enter the abdominal aorta under fluoroscopy. The needle was removed, and a micropuncture sheath was placed over the wire. The inner dilator and wire were removed, and an 035 Bentson wire was advanced under fluoroscopy into the abdominal aorta. The sheath was removed and a standard 5 [place] vascular sheath was placed. The dilator was removed and the sheath was flushed. Standard C2 Cobra catheter advanced on the Bentson wire to the abdominal aorta. Catheter was used to select the celiac artery origin. Angiogram was performed. Glidewire was advanced, and the catheter was used to select the proper hepatic artery. Angiogram was performed. Catheter was withdrawn to the common hepatic artery. Angiogram was performed. Microcatheter was then advanced into the right hepatic artery for dedicated angiogram of right hepatic artery and the anterior segment of the right hepatic artery. Once we confirmed the catheter position within the anterior segment of the right hepatic artery, the team called for the Y 90 dose. Y 90 dose was then administered with the assistance of Dr. Leonia Reeves. All catheters and wires were disposed properly. Angio-Seal was then deployed for hemostasis at the right common femoral artery access. Patient tolerated the procedure well and remained hemodynamically stable throughout. No complications were encountered and no significant blood loss. IMPRESSION: Status post ultrasound guided access right common femoral artery for mesenteric angiogram and treatment with Y 90  to the infiltrative and focal [place] tumor of the anterior right liver. Status post Angio-Seal for hemostasis. Signed, Dulcy Fanny. Dellia Nims, RPVI Vascular and Interventional Radiology Specialists [facility] Radiology Electronically Signed   By: Corrie Mckusick D.O.   On: 11/05/2018 12:29   Ir Angiogram Selective Each Additional Vessel  Result Date: 10/17/2018 INDICATION: 63 year old male with hepatocellular carcinoma presents for Y 90 mapping study. He has a focal tumor in segment 5 with typical imaging characteristics and a more infiltrative tumor throughout the right liver which is biopsy proven to be HCC. Additional hyperenhancing foci of the left liver are nonspecific. AFP greater than 11,000 and rising EXAM: ULTRASOUND GUIDED ACCESS RIGHT COMMON FEMORAL ARTERY MESENTERIC ANGIOGRAM, TARGETING CELIAC ARTERY, SUPERIOR MESENTERIC ARTERY, AND SELECTIVE IN SUPRA SELECTIVE ANGIOGRAM OF  HEPATIC ARTERIAL VASCULATURE. EMPIRIC COIL EMBOLIZATION OF GASTRODUODENAL ARTERY AS A PROTECTIVE MEASURE FOR FUTURE Y 90 MAA DOSE FOR FUTURE PLANNING DEPLOYMENT OF EXOSEAL FOR HEMOSTASIS MEDICATIONS: None ANESTHESIA/SEDATION: Moderate (conscious) sedation was employed during this procedure. A total of Versed 6.0 mg and Fentanyl 100 mcg was administered intravenously. Moderate Sedation Time: 89 minutes. The patient's level of consciousness and vital signs were monitored continuously by radiology nursing throughout the procedure under my direct supervision. CONTRAST:  163 cc Omni 300 FLUOROSCOPY TIME:  Fluoroscopy Time: 18 minutes 12 seconds (2,732 mGy). COMPLICATIONS: None PROCEDURE: Informed consent was obtained from the patient following explanation of the procedure, risks, benefits and alternatives. The patient understands, agrees and consents for the procedure. All questions were addressed. A time out was performed prior to the initiation of the procedure. Maximal barrier sterile technique utilized including caps, mask, sterile gowns,  sterile gloves, large sterile drape, hand hygiene, and Betadine prep. Ultrasound survey of the right inguinal region was performed with images stored and sent to PACs, confirming patency of the vessel. A micropuncture needle was used access the right common femoral artery under ultrasound. With excellent arterial blood flow returned, and an .018 micro wire was passed through the needle, observed enter the abdominal aorta under fluoroscopy. The needle was removed, and a micropuncture sheath was placed over the wire. The inner dilator and wire were removed, and an 035 Bentson wire was advanced under fluoroscopy into the abdominal aorta. The sheath was removed and a standard 5 [place] vascular sheath was placed. The dilator was removed and the sheath was flushed. A Bentson wire was used to introduce a standard C2 Cobra catheter. Cobra catheter was used to select the superior mesenteric artery. Angiogram was performed. Catheter was then advanced to select the celiac artery. Angiogram was performed. Glidewire was then used to navigate the base catheter into the common hepatic artery. Common hepatic artery angiogram was performed. High-flow Progreat catheter was then advanced on an 014 microwire into the right hepatic artery. Angiogram was performed. Catheter was then used to select the anterior and posterior divisions of the right hepatic artery, with super selective angiogram performed of the segment 5 branches, in an attempt to angiographically dissect the arterial anatomy of the segment 5 tumor. Multiple angiogram with multiple projections were performed. During this angiographic dissection, the C2 base catheter buckled into the aorta, with loss of catheter position. Base catheter was then returned to the common hepatic artery with the use of a Glidewire. STC microcatheter was then advanced through the base catheter for selection of the gastroduodenal artery. Angiogram was performed. Empiric coil embolization was then  performed of the gastroduodenal artery with a combination of 4 mm diameter, C5 mm diameter, and 6 mm diameter coils to the origin. Repeat angiogram was performed. STC coil was then used to select the left hepatic artery. Angiogram was performed. Microcatheter was then returned to the proximal right hepatic artery, just beyond the origin of the left hepatic artery, with angiogram confirming location. This was the site of the MAA dose administration. Base catheter microcatheter with then withdrawn, carefully this posing into the proper for waist receptive coal. Chlorhexidine was used to clean the skin, 8 cc of lidocaine were administered to the access site, and EXOSEAL was deployed. Patient tolerated the procedure well and remained hemodynamically stable throughout. No complications were encountered and no significant blood loss. FINDINGS: No significant atherosclerotic changes. SMA patent, with no replaced right hepatic artery or contribution to the ARDS the liver. Portal  vein patent. Celiac artery demonstrates standard anatomy, contributing to left gastric artery, splenic artery, common hepatic artery. A very proximal right phrenic artery originates from the origin, which does not appear to significantly supplied tumor. Pancreatic magna arises from the celiac artery. Common hepatic artery injection demonstrates no supra duodenal, retroduodenal, or right gastric artery. Traditional arrangement of the gastroduodenal artery, with pancreaticoduodenal arcade and gastroepiploic artery. Proper hepatic artery angiogram and right hepatic artery angiogram demonstrate proximal division to the anterior and posterior, with a questionable extrahepatic branch originating near the bifurcation of anterior and posterior segments (series 8 and 10.) this runs parallel to the expected course of cystic artery, and may represent cystic artery. Series of injection of super selective arteries from the segment 5 and anterior division of the  right hepatic vasculature confirms significant contribution to the segment 5 tumor. No early venous shunting was identified. Tortuous anatomy at this location contributed to loss of base catheter position and loss of microcatheter position. Gastroduodenal artery injection demonstrates no right gastric artery, with patent gastroepiploic artery and pancreaticoduodenal arcade. Coil embolization was performed to the origin. Left hepatic artery injection demonstrates no extrahepatic branches with no significant contribution to the right-sided segment 5 tumor. There is a mottled appearance of the left liver parenchyma not appreciated on the prior CT exam. No venous shunting identified. The MAA dose was administered just beyond the left hepatic artery origin within the right hepatic artery. IMPRESSION: Status post ultrasound guided access right common femoral artery for mesenteric angiogram, empiric coil embolization of the GDA, as pre Y 90 mapping study for progressive multifocal HCC. Note that 1 single questionable vessel identified on the right hepatic angiogram which may represent cystic artery versus extrahepatic/supra duodenal branch, and may need interrogation on treatment day. Status post Exoseal deployment. Signed, Dulcy Fanny. Dellia Nims, RPVI Vascular and Interventional Radiology Specialists [facility] Radiology Electronically Signed   By: Corrie Mckusick D.O.   On: 10/17/2018 12:50   Ir Angiogram Selective Each Additional Vessel  Result Date: 10/17/2018 INDICATION: 63 year old male with hepatocellular carcinoma presents for Y 90 mapping study. He has a focal tumor in segment 5 with typical imaging characteristics and a more infiltrative tumor throughout the right liver which is biopsy proven to be HCC. Additional hyperenhancing foci of the left liver are nonspecific. AFP greater than 11,000 and rising EXAM: ULTRASOUND GUIDED ACCESS RIGHT COMMON FEMORAL ARTERY MESENTERIC ANGIOGRAM, TARGETING CELIAC ARTERY, SUPERIOR  MESENTERIC ARTERY, AND SELECTIVE IN SUPRA SELECTIVE ANGIOGRAM OF HEPATIC ARTERIAL VASCULATURE. EMPIRIC COIL EMBOLIZATION OF GASTRODUODENAL ARTERY AS A PROTECTIVE MEASURE FOR FUTURE Y 90 MAA DOSE FOR FUTURE PLANNING DEPLOYMENT OF EXOSEAL FOR HEMOSTASIS MEDICATIONS: None ANESTHESIA/SEDATION: Moderate (conscious) sedation was employed during this procedure. A total of Versed 6.0 mg and Fentanyl 100 mcg was administered intravenously. Moderate Sedation Time: 89 minutes. The patient's level of consciousness and vital signs were monitored continuously by radiology nursing throughout the procedure under my direct supervision. CONTRAST:  163 cc Omni 300 FLUOROSCOPY TIME:  Fluoroscopy Time: 18 minutes 12 seconds (2,732 mGy). COMPLICATIONS: None PROCEDURE: Informed consent was obtained from the patient following explanation of the procedure, risks, benefits and alternatives. The patient understands, agrees and consents for the procedure. All questions were addressed. A time out was performed prior to the initiation of the procedure. Maximal barrier sterile technique utilized including caps, mask, sterile gowns, sterile gloves, large sterile drape, hand hygiene, and Betadine prep. Ultrasound survey of the right inguinal region was performed with images stored and sent to PACs, confirming  patency of the vessel. A micropuncture needle was used access the right common femoral artery under ultrasound. With excellent arterial blood flow returned, and an .018 micro wire was passed through the needle, observed enter the abdominal aorta under fluoroscopy. The needle was removed, and a micropuncture sheath was placed over the wire. The inner dilator and wire were removed, and an 035 Bentson wire was advanced under fluoroscopy into the abdominal aorta. The sheath was removed and a standard 5 [place] vascular sheath was placed. The dilator was removed and the sheath was flushed. A Bentson wire was used to introduce a standard C2 Cobra  catheter. Cobra catheter was used to select the superior mesenteric artery. Angiogram was performed. Catheter was then advanced to select the celiac artery. Angiogram was performed. Glidewire was then used to navigate the base catheter into the common hepatic artery. Common hepatic artery angiogram was performed. High-flow Progreat catheter was then advanced on an 014 microwire into the right hepatic artery. Angiogram was performed. Catheter was then used to select the anterior and posterior divisions of the right hepatic artery, with super selective angiogram performed of the segment 5 branches, in an attempt to angiographically dissect the arterial anatomy of the segment 5 tumor. Multiple angiogram with multiple projections were performed. During this angiographic dissection, the C2 base catheter buckled into the aorta, with loss of catheter position. Base catheter was then returned to the common hepatic artery with the use of a Glidewire. STC microcatheter was then advanced through the base catheter for selection of the gastroduodenal artery. Angiogram was performed. Empiric coil embolization was then performed of the gastroduodenal artery with a combination of 4 mm diameter, C5 mm diameter, and 6 mm diameter coils to the origin. Repeat angiogram was performed. STC coil was then used to select the left hepatic artery. Angiogram was performed. Microcatheter was then returned to the proximal right hepatic artery, just beyond the origin of the left hepatic artery, with angiogram confirming location. This was the site of the MAA dose administration. Base catheter microcatheter with then withdrawn, carefully this posing into the proper for waist receptive coal. Chlorhexidine was used to clean the skin, 8 cc of lidocaine were administered to the access site, and EXOSEAL was deployed. Patient tolerated the procedure well and remained hemodynamically stable throughout. No complications were encountered and no significant  blood loss. FINDINGS: No significant atherosclerotic changes. SMA patent, with no replaced right hepatic artery or contribution to the ARDS the liver. Portal vein patent. Celiac artery demonstrates standard anatomy, contributing to left gastric artery, splenic artery, common hepatic artery. A very proximal right phrenic artery originates from the origin, which does not appear to significantly supplied tumor. Pancreatic magna arises from the celiac artery. Common hepatic artery injection demonstrates no supra duodenal, retroduodenal, or right gastric artery. Traditional arrangement of the gastroduodenal artery, with pancreaticoduodenal arcade and gastroepiploic artery. Proper hepatic artery angiogram and right hepatic artery angiogram demonstrate proximal division to the anterior and posterior, with a questionable extrahepatic branch originating near the bifurcation of anterior and posterior segments (series 8 and 10.) this runs parallel to the expected course of cystic artery, and may represent cystic artery. Series of injection of super selective arteries from the segment 5 and anterior division of the right hepatic vasculature confirms significant contribution to the segment 5 tumor. No early venous shunting was identified. Tortuous anatomy at this location contributed to loss of base catheter position and loss of microcatheter position. Gastroduodenal artery injection demonstrates no right gastric artery,  with patent gastroepiploic artery and pancreaticoduodenal arcade. Coil embolization was performed to the origin. Left hepatic artery injection demonstrates no extrahepatic branches with no significant contribution to the right-sided segment 5 tumor. There is a mottled appearance of the left liver parenchyma not appreciated on the prior CT exam. No venous shunting identified. The MAA dose was administered just beyond the left hepatic artery origin within the right hepatic artery. IMPRESSION: Status post  ultrasound guided access right common femoral artery for mesenteric angiogram, empiric coil embolization of the GDA, as pre Y 90 mapping study for progressive multifocal HCC. Note that 1 single questionable vessel identified on the right hepatic angiogram which may represent cystic artery versus extrahepatic/supra duodenal branch, and may need interrogation on treatment day. Status post Exoseal deployment. Signed, Dulcy Fanny. Dellia Nims, RPVI [facility] Inter[facility] Radiology Specialists [facility] Radiology Electronically Signed   By: Corrie Mckusick D.O.   On: 10/17/2018 12:50   Ir Angiogram Selective Each Additional Vessel  Result Date: 10/17/2018 INDICATION: 63 year old male with hepatocellular carcinoma presents for Y 90 mapping study. He has a focal tumor in segment 5 with typical imaging characteristics and a more infiltrative tumor throughout the right liver which is biopsy proven to be HCC. Additional hyperenhancing foci of the left liver are nonspecific. AFP greater than 11,000 and rising EXAM: ULTRASOUND GUIDED ACCESS RIGHT COMMON FEMORAL ARTERY MESENTERIC ANGIOGRAM, TARGETING CELIAC ARTERY, SUPERIOR MESENTERIC ARTERY, AND SELECTIVE IN SUPRA SELECTIVE ANGIOGRAM OF HEPATIC ARTERIAL VASCULATURE. EMPIRIC COIL EMBOLIZATION OF GASTRODUODENAL ARTERY AS A PROTECTIVE MEASURE FOR FUTURE Y 90 MAA DOSE FOR FUTURE PLANNING DEPLOYMENT OF EXOSEAL FOR HEMOSTASIS MEDICATIONS: None ANESTHESIA/SEDATION: Moderate (conscious) sedation was employed during this procedure. A total of Versed 6.0 mg and Fentanyl 100 mcg was administered intravenously. Moderate Sedation Time: 89 minutes. The patient's level of consciousness and vital signs were monitored continuously by radiology nursing throughout the procedure under my direct supervision. CONTRAST:  163 cc Omni 300 FLUOROSCOPY TIME:  Fluoroscopy Time: 18 minutes 12 seconds (2,732 mGy). COMPLICATIONS: None PROCEDURE: Informed consent was obtained from the patient following  explanation of the procedure, risks, benefits and alternatives. The patient understands, agrees and consents for the procedure. All questions were addressed. A time out was performed prior to the initiation of the procedure. Maximal barrier sterile technique utilized including caps, mask, sterile gowns, sterile gloves, large sterile drape, hand hygiene, and Betadine prep. Ultrasound survey of the right inguinal region was performed with images stored and sent to PACs, confirming patency of the vessel. A micropuncture needle was used access the right common femoral artery under ultrasound. With excellent arterial blood flow returned, and an .018 micro wire was passed through the needle, observed enter the abdominal aorta under fluoroscopy. The needle was removed, and a micropuncture sheath was placed over the wire. The inner dilator and wire were removed, and an 035 Bentson wire was advanced under fluoroscopy into the abdominal aorta. The sheath was removed and a standard 5 [place] vascular sheath was placed. The dilator was removed and the sheath was flushed. A Bentson wire was used to introduce a standard C2 Cobra catheter. Cobra catheter was used to select the superior mesenteric artery. Angiogram was performed. Catheter was then advanced to select the celiac artery. Angiogram was performed. Glidewire was then used to navigate the base catheter into the common hepatic artery. Common hepatic artery angiogram was performed. High-flow Progreat catheter was then advanced on an 014 microwire into the right hepatic artery. Angiogram was performed. Catheter was then used to  select the anterior and posterior divisions of the right hepatic artery, with super selective angiogram performed of the segment 5 branches, in an attempt to angiographically dissect the arterial anatomy of the segment 5 tumor. Multiple angiogram with multiple projections were performed. During this angiographic dissection, the C2 base catheter buckled  into the aorta, with loss of catheter position. Base catheter was then returned to the common hepatic artery with the use of a Glidewire. STC microcatheter was then advanced through the base catheter for selection of the gastroduodenal artery. Angiogram was performed. Empiric coil embolization was then performed of the gastroduodenal artery with a combination of 4 mm diameter, C5 mm diameter, and 6 mm diameter coils to the origin. Repeat angiogram was performed. STC coil was then used to select the left hepatic artery. Angiogram was performed. Microcatheter was then returned to the proximal right hepatic artery, just beyond the origin of the left hepatic artery, with angiogram confirming location. This was the site of the MAA dose administration. Base catheter microcatheter with then withdrawn, carefully this posing into the proper for waist receptive coal. Chlorhexidine was used to clean the skin, 8 cc of lidocaine were administered to the access site, and EXOSEAL was deployed. Patient tolerated the procedure well and remained hemodynamically stable throughout. No complications were encountered and no significant blood loss. FINDINGS: No significant atherosclerotic changes. SMA patent, with no replaced right hepatic artery or contribution to the ARDS the liver. Portal vein patent. Celiac artery demonstrates standard anatomy, contributing to left gastric artery, splenic artery, common hepatic artery. A very proximal right phrenic artery originates from the origin, which does not appear to significantly supplied tumor. Pancreatic magna arises from the celiac artery. Common hepatic artery injection demonstrates no supra duodenal, retroduodenal, or right gastric artery. Traditional arrangement of the gastroduodenal artery, with pancreaticoduodenal arcade and gastroepiploic artery. Proper hepatic artery angiogram and right hepatic artery angiogram demonstrate proximal division to the anterior and posterior, with a  questionable extrahepatic branch originating near the bifurcation of anterior and posterior segments (series 8 and 10.) this runs parallel to the expected course of cystic artery, and may represent cystic artery. Series of injection of super selective arteries from the segment 5 and anterior division of the right hepatic vasculature confirms significant contribution to the segment 5 tumor. No early venous shunting was identified. Tortuous anatomy at this location contributed to loss of base catheter position and loss of microcatheter position. Gastroduodenal artery injection demonstrates no right gastric artery, with patent gastroepiploic artery and pancreaticoduodenal arcade. Coil embolization was performed to the origin. Left hepatic artery injection demonstrates no extrahepatic branches with no significant contribution to the right-sided segment 5 tumor. There is a mottled appearance of the left liver parenchyma not appreciated on the prior CT exam. No venous shunting identified. The MAA dose was administered just beyond the left hepatic artery origin within the right hepatic artery. IMPRESSION: Status post ultrasound guided access right common femoral artery for mesenteric angiogram, empiric coil embolization of the GDA, as pre Y 90 mapping study for progressive multifocal HCC. Note that 1 single questionable vessel identified on the right hepatic angiogram which may represent cystic artery versus extrahepatic/supra duodenal branch, and may need interrogation on treatment day. Status post Exoseal deployment. Signed, Dulcy Fanny. Dellia Nims, RPVI Vascular and Interventional Radiology Specialists [facility] Radiology Electronically Signed   By: Corrie Mckusick D.O.   On: 10/17/2018 12:50   Ir Angiogram Selective Each Additional Vessel  Result Date: 10/17/2018 INDICATION: 63 year old male with hepatocellular  carcinoma presents for Y 90 mapping study. He has a focal tumor in segment 5 with typical imaging  characteristics and a more infiltrative tumor throughout the right liver which is biopsy proven to be HCC. Additional hyperenhancing foci of the left liver are nonspecific. AFP greater than 11,000 and rising EXAM: ULTRASOUND GUIDED ACCESS RIGHT COMMON FEMORAL ARTERY MESENTERIC ANGIOGRAM, TARGETING CELIAC ARTERY, SUPERIOR MESENTERIC ARTERY, AND SELECTIVE IN SUPRA SELECTIVE ANGIOGRAM OF HEPATIC ARTERIAL VASCULATURE. EMPIRIC COIL EMBOLIZATION OF GASTRODUODENAL ARTERY AS A PROTECTIVE MEASURE FOR FUTURE Y 90 MAA DOSE FOR FUTURE PLANNING DEPLOYMENT OF EXOSEAL FOR HEMOSTASIS MEDICATIONS: None ANESTHESIA/SEDATION: Moderate (conscious) sedation was employed during this procedure. A total of Versed 6.0 mg and Fentanyl 100 mcg was administered intravenously. Moderate Sedation Time: 89 minutes. The patient's level of consciousness and vital signs were monitored continuously by radiology nursing throughout the procedure under my direct supervision. CONTRAST:  163 cc Omni 300 FLUOROSCOPY TIME:  Fluoroscopy Time: 18 minutes 12 seconds (2,732 mGy). COMPLICATIONS: None PROCEDURE: Informed consent was obtained from the patient following explanation of the procedure, risks, benefits and alternatives. The patient understands, agrees and consents for the procedure. All questions were addressed. A time out was performed prior to the initiation of the procedure. Maximal barrier sterile technique utilized including caps, mask, sterile gowns, sterile gloves, large sterile drape, hand hygiene, and Betadine prep. Ultrasound survey of the right inguinal region was performed with images stored and sent to PACs, confirming patency of the vessel. A micropuncture needle was used access the right common femoral artery under ultrasound. With excellent arterial blood flow returned, and an .018 micro wire was passed through the needle, observed enter the abdominal aorta under fluoroscopy. The needle was removed, and a micropuncture sheath was placed  over the wire. The inner dilator and wire were removed, and an 035 Bentson wire was advanced under fluoroscopy into the abdominal aorta. The sheath was removed and a standard 5 [place] vascular sheath was placed. The dilator was removed and the sheath was flushed. A Bentson wire was used to introduce a standard C2 Cobra catheter. Cobra catheter was used to select the superior mesenteric artery. Angiogram was performed. Catheter was then advanced to select the celiac artery. Angiogram was performed. Glidewire was then used to navigate the base catheter into the common hepatic artery. Common hepatic artery angiogram was performed. High-flow Progreat catheter was then advanced on an 014 microwire into the right hepatic artery. Angiogram was performed. Catheter was then used to select the anterior and posterior divisions of the right hepatic artery, with super selective angiogram performed of the segment 5 branches, in an attempt to angiographically dissect the arterial anatomy of the segment 5 tumor. Multiple angiogram with multiple projections were performed. During this angiographic dissection, the C2 base catheter buckled into the aorta, with loss of catheter position. Base catheter was then returned to the common hepatic artery with the use of a Glidewire. STC microcatheter was then advanced through the base catheter for selection of the gastroduodenal artery. Angiogram was performed. Empiric coil embolization was then performed of the gastroduodenal artery with a combination of 4 mm diameter, C5 mm diameter, and 6 mm diameter coils to the origin. Repeat angiogram was performed. STC coil was then used to select the left hepatic artery. Angiogram was performed. Microcatheter was then returned to the proximal right hepatic artery, just beyond the origin of the left hepatic artery, with angiogram confirming location. This was the site of the MAA dose administration. Base catheter microcatheter with  then withdrawn,  carefully this posing into the proper for waist receptive coal. Chlorhexidine was used to clean the skin, 8 cc of lidocaine were administered to the access site, and EXOSEAL was deployed. Patient tolerated the procedure well and remained hemodynamically stable throughout. No complications were encountered and no significant blood loss. FINDINGS: No significant atherosclerotic changes. SMA patent, with no replaced right hepatic artery or contribution to the ARDS the liver. Portal vein patent. Celiac artery demonstrates standard anatomy, contributing to left gastric artery, splenic artery, common hepatic artery. A very proximal right phrenic artery originates from the origin, which does not appear to significantly supplied tumor. Pancreatic magna arises from the celiac artery. Common hepatic artery injection demonstrates no supra duodenal, retroduodenal, or right gastric artery. Traditional arrangement of the gastroduodenal artery, with pancreaticoduodenal arcade and gastroepiploic artery. Proper hepatic artery angiogram and right hepatic artery angiogram demonstrate proximal division to the anterior and posterior, with a questionable extrahepatic branch originating near the bifurcation of anterior and posterior segments (series 8 and 10.) this runs parallel to the expected course of cystic artery, and may represent cystic artery. Series of injection of super selective arteries from the segment 5 and anterior division of the right hepatic vasculature confirms significant contribution to the segment 5 tumor. No early venous shunting was identified. Tortuous anatomy at this location contributed to loss of base catheter position and loss of microcatheter position. Gastroduodenal artery injection demonstrates no right gastric artery, with patent gastroepiploic artery and pancreaticoduodenal arcade. Coil embolization was performed to the origin. Left hepatic artery injection demonstrates no extrahepatic branches with no  significant contribution to the right-sided segment 5 tumor. There is a mottled appearance of the left liver parenchyma not appreciated on the prior CT exam. No venous shunting identified. The MAA dose was administered just beyond the left hepatic artery origin within the right hepatic artery. IMPRESSION: Status post ultrasound guided access right common femoral artery for mesenteric angiogram, empiric coil embolization of the GDA, as pre Y 90 mapping study for progressive multifocal HCC. Note that 1 single questionable vessel identified on the right hepatic angiogram which may represent cystic artery versus extrahepatic/supra duodenal branch, and may need interrogation on treatment day. Status post Exoseal deployment. Signed, Dulcy Fanny. Dellia Nims, RPVI Vascular and Interventional Radiology Specialists [facility] Radiology Electronically Signed   By: Corrie Mckusick D.O.   On: 10/17/2018 12:50   Ir Angiogram Selective Each Additional Vessel  Result Date: 10/17/2018 INDICATION: 63 year old male with hepatocellular carcinoma presents for Y 90 mapping study. He has a focal tumor in segment 5 with typical imaging characteristics and a more infiltrative tumor throughout the right liver which is biopsy proven to be HCC. Additional hyperenhancing foci of the left liver are nonspecific. AFP greater than 11,000 and rising EXAM: ULTRASOUND GUIDED ACCESS RIGHT COMMON FEMORAL ARTERY MESENTERIC ANGIOGRAM, TARGETING CELIAC ARTERY, SUPERIOR MESENTERIC ARTERY, AND SELECTIVE IN SUPRA SELECTIVE ANGIOGRAM OF HEPATIC ARTERIAL VASCULATURE. EMPIRIC COIL EMBOLIZATION OF GASTRODUODENAL ARTERY AS A PROTECTIVE MEASURE FOR FUTURE Y 90 MAA DOSE FOR FUTURE PLANNING DEPLOYMENT OF EXOSEAL FOR HEMOSTASIS MEDICATIONS: None ANESTHESIA/SEDATION: Moderate (conscious) sedation was employed during this procedure. A total of Versed 6.0 mg and Fentanyl 100 mcg was administered intravenously. Moderate Sedation Time: 89 minutes. The patient's level of  consciousness and vital signs were monitored continuously by radiology nursing throughout the procedure under my direct supervision. CONTRAST:  163 cc Omni 300 FLUOROSCOPY TIME:  Fluoroscopy Time: 18 minutes 12 seconds (2,732 mGy). COMPLICATIONS: None PROCEDURE: Informed consent was obtained  from the patient following explanation of the procedure, risks, benefits and alternatives. The patient understands, agrees and consents for the procedure. All questions were addressed. A time out was performed prior to the initiation of the procedure. Maximal barrier sterile technique utilized including caps, mask, sterile gowns, sterile gloves, large sterile drape, hand hygiene, and Betadine prep. Ultrasound survey of the right inguinal region was performed with images stored and sent to PACs, confirming patency of the vessel. A micropuncture needle was used access the right common femoral artery under ultrasound. With excellent arterial blood flow returned, and an .018 micro wire was passed through the needle, observed enter the abdominal aorta under fluoroscopy. The needle was removed, and a micropuncture sheath was placed over the wire. The inner dilator and wire were removed, and an 035 Bentson wire was advanced under fluoroscopy into the abdominal aorta. The sheath was removed and a standard 5 [place] vascular sheath was placed. The dilator was removed and the sheath was flushed. A Bentson wire was used to introduce a standard C2 Cobra catheter. Cobra catheter was used to select the superior mesenteric artery. Angiogram was performed. Catheter was then advanced to select the celiac artery. Angiogram was performed. Glidewire was then used to navigate the base catheter into the common hepatic artery. Common hepatic artery angiogram was performed. High-flow Progreat catheter was then advanced on an 014 microwire into the right hepatic artery. Angiogram was performed. Catheter was then used to select the anterior and posterior  divisions of the right hepatic artery, with super selective angiogram performed of the segment 5 branches, in an attempt to angiographically dissect the arterial anatomy of the segment 5 tumor. Multiple angiogram with multiple projections were performed. During this angiographic dissection, the C2 base catheter buckled into the aorta, with loss of catheter position. Base catheter was then returned to the common hepatic artery with the use of a Glidewire. STC microcatheter was then advanced through the base catheter for selection of the gastroduodenal artery. Angiogram was performed. Empiric coil embolization was then performed of the gastroduodenal artery with a combination of 4 mm diameter, C5 mm diameter, and 6 mm diameter coils to the origin. Repeat angiogram was performed. STC coil was then used to select the left hepatic artery. Angiogram was performed. Microcatheter was then returned to the proximal right hepatic artery, just beyond the origin of the left hepatic artery, with angiogram confirming location. This was the site of the MAA dose administration. Base catheter microcatheter with then withdrawn, carefully this posing into the proper for waist receptive coal. Chlorhexidine was used to clean the skin, 8 cc of lidocaine were administered to the access site, and EXOSEAL was deployed. Patient tolerated the procedure well and remained hemodynamically stable throughout. No complications were encountered and no significant blood loss. FINDINGS: No significant atherosclerotic changes. SMA patent, with no replaced right hepatic artery or contribution to the ARDS the liver. Portal vein patent. Celiac artery demonstrates standard anatomy, contributing to left gastric artery, splenic artery, common hepatic artery. A very proximal right phrenic artery originates from the origin, which does not appear to significantly supplied tumor. Pancreatic magna arises from the celiac artery. Common hepatic artery injection  demonstrates no supra duodenal, retroduodenal, or right gastric artery. Traditional arrangement of the gastroduodenal artery, with pancreaticoduodenal arcade and gastroepiploic artery. Proper hepatic artery angiogram and right hepatic artery angiogram demonstrate proximal division to the anterior and posterior, with a questionable extrahepatic branch originating near the bifurcation of anterior and posterior segments (series 8 and  10.) this runs parallel to the expected course of cystic artery, and may represent cystic artery. Series of injection of super selective arteries from the segment 5 and anterior division of the right hepatic vasculature confirms significant contribution to the segment 5 tumor. No early venous shunting was identified. Tortuous anatomy at this location contributed to loss of base catheter position and loss of microcatheter position. Gastroduodenal artery injection demonstrates no right gastric artery, with patent gastroepiploic artery and pancreaticoduodenal arcade. Coil embolization was performed to the origin. Left hepatic artery injection demonstrates no extrahepatic branches with no significant contribution to the right-sided segment 5 tumor. There is a mottled appearance of the left liver parenchyma not appreciated on the prior CT exam. No venous shunting identified. The MAA dose was administered just beyond the left hepatic artery origin within the right hepatic artery. IMPRESSION: Status post ultrasound guided access right common femoral artery for mesenteric angiogram, empiric coil embolization of the GDA, as pre Y 90 mapping study for progressive multifocal HCC. Note that 1 single questionable vessel identified on the right hepatic angiogram which may represent cystic artery versus extrahepatic/supra duodenal branch, and may need interrogation on treatment day. Status post Exoseal deployment. Signed, Dulcy Fanny. Dellia Nims, RPVI [facility] Inter[facility]iology Specialists  [facility] Radiology Electronically Signed   By: Corrie Mckusick D.O.   On: 10/17/2018 12:50   Ir Angiogram Selective Each Additional Vessel  Result Date: 10/17/2018 INDICATION: 63 year old male with hepatocellular carcinoma presents for Y 90 mapping study. He has a focal tumor in segment 5 with typical imaging characteristics and a more infiltrative tumor throughout the right liver which is biopsy proven to be HCC. Additional hyperenhancing foci of the left liver are nonspecific. AFP greater than 11,000 and rising EXAM: ULTRASOUND GUIDED ACCESS RIGHT COMMON FEMORAL ARTERY MESENTERIC ANGIOGRAM, TARGETING CELIAC ARTERY, SUPERIOR MESENTERIC ARTERY, AND SELECTIVE IN SUPRA SELECTIVE ANGIOGRAM OF HEPATIC ARTERIAL VASCULATURE. EMPIRIC COIL EMBOLIZATION OF GASTRODUODENAL ARTERY AS A PROTECTIVE MEASURE FOR FUTURE Y 90 MAA DOSE FOR FUTURE PLANNING DEPLOYMENT OF EXOSEAL FOR HEMOSTASIS MEDICATIONS: None ANESTHESIA/SEDATION: Moderate (conscious) sedation was employed during this procedure. A total of Versed 6.0 mg and Fentanyl 100 mcg was administered intravenously. Moderate Sedation Time: 89 minutes. The patient's level of consciousness and vital signs were monitored continuously by radiology nursing throughout the procedure under my direct supervision. CONTRAST:  163 cc Omni 300 FLUOROSCOPY TIME:  Fluoroscopy Time: 18 minutes 12 seconds (2,732 mGy). COMPLICATIONS: None PROCEDURE: Informed consent was obtained from the patient following explanation of the procedure, risks, benefits and alternatives. The patient understands, agrees and consents for the procedure. All questions were addressed. A time out was performed prior to the initiation of the procedure. Maximal barrier sterile technique utilized including caps, mask, sterile gowns, sterile gloves, large sterile drape, hand hygiene, and Betadine prep. Ultrasound survey of the right inguinal region was performed with images stored and sent to PACs, confirming patency of the  vessel. A micropuncture needle was used access the right common femoral artery under ultrasound. With excellent arterial blood flow returned, and an .018 micro wire was passed through the needle, observed enter the abdominal aorta under fluoroscopy. The needle was removed, and a micropuncture sheath was placed over the wire. The inner dilator and wire were removed, and an 035 Bentson wire was advanced under fluoroscopy into the abdominal aorta. The sheath was removed and a standard 5 [place] vascular sheath was placed. The dilator was removed and the sheath was flushed. A Bentson wire was used to  introduce a standard C2 Cobra catheter. Cobra catheter was used to select the superior mesenteric artery. Angiogram was performed. Catheter was then advanced to select the celiac artery. Angiogram was performed. Glidewire was then used to navigate the base catheter into the common hepatic artery. Common hepatic artery angiogram was performed. High-flow Progreat catheter was then advanced on an 014 microwire into the right hepatic artery. Angiogram was performed. Catheter was then used to select the anterior and posterior divisions of the right hepatic artery, with super selective angiogram performed of the segment 5 branches, in an attempt to angiographically dissect the arterial anatomy of the segment 5 tumor. Multiple angiogram with multiple projections were performed. During this angiographic dissection, the C2 base catheter buckled into the aorta, with loss of catheter position. Base catheter was then returned to the common hepatic artery with the use of a Glidewire. STC microcatheter was then advanced through the base catheter for selection of the gastroduodenal artery. Angiogram was performed. Empiric coil embolization was then performed of the gastroduodenal artery with a combination of 4 mm diameter, C5 mm diameter, and 6 mm diameter coils to the origin. Repeat angiogram was performed. STC coil was then used to  select the left hepatic artery. Angiogram was performed. Microcatheter was then returned to the proximal right hepatic artery, just beyond the origin of the left hepatic artery, with angiogram confirming location. This was the site of the MAA dose administration. Base catheter microcatheter with then withdrawn, carefully this posing into the proper for waist receptive coal. Chlorhexidine was used to clean the skin, 8 cc of lidocaine were administered to the access site, and EXOSEAL was deployed. Patient tolerated the procedure well and remained hemodynamically stable throughout. No complications were encountered and no significant blood loss. FINDINGS: No significant atherosclerotic changes. SMA patent, with no replaced right hepatic artery or contribution to the ARDS the liver. Portal vein patent. Celiac artery demonstrates standard anatomy, contributing to left gastric artery, splenic artery, common hepatic artery. A very proximal right phrenic artery originates from the origin, which does not appear to significantly supplied tumor. Pancreatic magna arises from the celiac artery. Common hepatic artery injection demonstrates no supra duodenal, retroduodenal, or right gastric artery. Traditional arrangement of the gastroduodenal artery, with pancreaticoduodenal arcade and gastroepiploic artery. Proper hepatic artery angiogram and right hepatic artery angiogram demonstrate proximal division to the anterior and posterior, with a questionable extrahepatic branch originating near the bifurcation of anterior and posterior segments (series 8 and 10.) this runs parallel to the expected course of cystic artery, and may represent cystic artery. Series of injection of super selective arteries from the segment 5 and anterior division of the right hepatic vasculature confirms significant contribution to the segment 5 tumor. No early venous shunting was identified. Tortuous anatomy at this location contributed to loss of base  catheter position and loss of microcatheter position. Gastroduodenal artery injection demonstrates no right gastric artery, with patent gastroepiploic artery and pancreaticoduodenal arcade. Coil embolization was performed to the origin. Left hepatic artery injection demonstrates no extrahepatic branches with no significant contribution to the right-sided segment 5 tumor. There is a mottled appearance of the left liver parenchyma not appreciated on the prior CT exam. No venous shunting identified. The MAA dose was administered just beyond the left hepatic artery origin within the right hepatic artery. IMPRESSION: Status post ultrasound guided access right common femoral artery for mesenteric angiogram, empiric coil embolization of the GDA, as pre Y 90 mapping study for progressive multifocal HCC. Note that  1 single questionable vessel identified on the right hepatic angiogram which may represent cystic artery versus extrahepatic/supra duodenal branch, and may need interrogation on treatment day. Status post Exoseal deployment. Signed, Dulcy Fanny. Dellia Nims, RPVI Vascular and Interventional Radiology Specialists [facility] Radiology Electronically Signed   By: Corrie Mckusick D.O.   On: 10/17/2018 12:50   Ir Angiogram Selective Each Additional Vessel  Result Date: 10/17/2018 INDICATION: 63 year old male with hepatocellular carcinoma presents for Y 90 mapping study. He has a focal tumor in segment 5 with typical imaging characteristics and a more infiltrative tumor throughout the right liver which is biopsy proven to be HCC. Additional hyperenhancing foci of the left liver are nonspecific. AFP greater than 11,000 and rising EXAM: ULTRASOUND GUIDED ACCESS RIGHT COMMON FEMORAL ARTERY MESENTERIC ANGIOGRAM, TARGETING CELIAC ARTERY, SUPERIOR MESENTERIC ARTERY, AND SELECTIVE IN SUPRA SELECTIVE ANGIOGRAM OF HEPATIC ARTERIAL VASCULATURE. EMPIRIC COIL EMBOLIZATION OF GASTRODUODENAL ARTERY AS A PROTECTIVE MEASURE FOR FUTURE Y 90  MAA DOSE FOR FUTURE PLANNING DEPLOYMENT OF EXOSEAL FOR HEMOSTASIS MEDICATIONS: None ANESTHESIA/SEDATION: Moderate (conscious) sedation was employed during this procedure. A total of Versed 6.0 mg and Fentanyl 100 mcg was administered intravenously. Moderate Sedation Time: 89 minutes. The patient's level of consciousness and vital signs were monitored continuously by radiology nursing throughout the procedure under my direct supervision. CONTRAST:  163 cc Omni 300 FLUOROSCOPY TIME:  Fluoroscopy Time: 18 minutes 12 seconds (2,732 mGy). COMPLICATIONS: None PROCEDURE: Informed consent was obtained from the patient following explanation of the procedure, risks, benefits and alternatives. The patient understands, agrees and consents for the procedure. All questions were addressed. A time out was performed prior to the initiation of the procedure. Maximal barrier sterile technique utilized including caps, mask, sterile gowns, sterile gloves, large sterile drape, hand hygiene, and Betadine prep. Ultrasound survey of the right inguinal region was performed with images stored and sent to PACs, confirming patency of the vessel. A micropuncture needle was used access the right common femoral artery under ultrasound. With excellent arterial blood flow returned, and an .018 micro wire was passed through the needle, observed enter the abdominal aorta under fluoroscopy. The needle was removed, and a micropuncture sheath was placed over the wire. The inner dilator and wire were removed, and an 035 Bentson wire was advanced under fluoroscopy into the abdominal aorta. The sheath was removed and a standard 5 [place] vascular sheath was placed. The dilator was removed and the sheath was flushed. A Bentson wire was used to introduce a standard C2 Cobra catheter. Cobra catheter was used to select the superior mesenteric artery. Angiogram was performed. Catheter was then advanced to select the celiac artery. Angiogram was performed.  Glidewire was then used to navigate the base catheter into the common hepatic artery. Common hepatic artery angiogram was performed. High-flow Progreat catheter was then advanced on an 014 microwire into the right hepatic artery. Angiogram was performed. Catheter was then used to select the anterior and posterior divisions of the right hepatic artery, with super selective angiogram performed of the segment 5 branches, in an attempt to angiographically dissect the arterial anatomy of the segment 5 tumor. Multiple angiogram with multiple projections were performed. During this angiographic dissection, the C2 base catheter buckled into the aorta, with loss of catheter position. Base catheter was then returned to the common hepatic artery with the use of a Glidewire. STC microcatheter was then advanced through the base catheter for selection of the gastroduodenal artery. Angiogram was performed. Empiric coil embolization was then performed of the  gastroduodenal artery with a combination of 4 mm diameter, C5 mm diameter, and 6 mm diameter coils to the origin. Repeat angiogram was performed. STC coil was then used to select the left hepatic artery. Angiogram was performed. Microcatheter was then returned to the proximal right hepatic artery, just beyond the origin of the left hepatic artery, with angiogram confirming location. This was the site of the MAA dose administration. Base catheter microcatheter with then withdrawn, carefully this posing into the proper for waist receptive coal. Chlorhexidine was used to clean the skin, 8 cc of lidocaine were administered to the access site, and EXOSEAL was deployed. Patient tolerated the procedure well and remained hemodynamically stable throughout. No complications were encountered and no significant blood loss. FINDINGS: No significant atherosclerotic changes. SMA patent, with no replaced right hepatic artery or contribution to the ARDS the liver. Portal vein patent. Celiac  artery demonstrates standard anatomy, contributing to left gastric artery, splenic artery, common hepatic artery. A very proximal right phrenic artery originates from the origin, which does not appear to significantly supplied tumor. Pancreatic magna arises from the celiac artery. Common hepatic artery injection demonstrates no supra duodenal, retroduodenal, or right gastric artery. Traditional arrangement of the gastroduodenal artery, with pancreaticoduodenal arcade and gastroepiploic artery. Proper hepatic artery angiogram and right hepatic artery angiogram demonstrate proximal division to the anterior and posterior, with a questionable extrahepatic branch originating near the bifurcation of anterior and posterior segments (series 8 and 10.) this runs parallel to the expected course of cystic artery, and may represent cystic artery. Series of injection of super selective arteries from the segment 5 and anterior division of the right hepatic vasculature confirms significant contribution to the segment 5 tumor. No early venous shunting was identified. Tortuous anatomy at this location contributed to loss of base catheter position and loss of microcatheter position. Gastroduodenal artery injection demonstrates no right gastric artery, with patent gastroepiploic artery and pancreaticoduodenal arcade. Coil embolization was performed to the origin. Left hepatic artery injection demonstrates no extrahepatic branches with no significant contribution to the right-sided segment 5 tumor. There is a mottled appearance of the left liver parenchyma not appreciated on the prior CT exam. No venous shunting identified. The MAA dose was administered just beyond the left hepatic artery origin within the right hepatic artery. IMPRESSION: Status post ultrasound guided access right common femoral artery for mesenteric angiogram, empiric coil embolization of the GDA, as pre Y 90 mapping study for progressive multifocal HCC. Note that 1  single questionable vessel identified on the right hepatic angiogram which may represent cystic artery versus extrahepatic/supra duodenal branch, and may need interrogation on treatment day. Status post Exoseal deployment. Signed, Dulcy Fanny. Dellia Nims, RPVI Vascular and Interventional Radiology Specialists [facility] Radiology Electronically Signed   By: Corrie Mckusick D.O.   On: 10/17/2018 12:50   Nm Special Med Rad Physics Cons  Result Date: 11/05/2018 CLINICAL DATA:  Hepatocellular carcinoma unresectable. Cirrhosis. Radioembolization the RIGHT hepatic lobe with segment 5/8 segmentectomy. EXAM: NUCLEAR MEDICINE SPECIAL MED RAD PHYSICS CONS; NUCLEAR MEDICINE RADIO PHARM THERAPY INTRA ARTERIAL; NUCLEAR MEDICINE TREATMENT PROCEDURE; NUCLEAR MEDICINE LIVER SCAN TECHNIQUE: In conjunction with the interventional radiologist a Y- Microsphere dose was calculated utilizing body surface area formulation. Calculated dose equal 23.2 mCi. Pre therapy MAA liver SPECT scan and CTA were evaluated. Planned segment 5/8 segmentectomy with high (2x) flex dose to the treatment segment. Utilizing a microcatheter system, the segmental RIGHT hepatic artery was selected and Y-90 microspheres were delivered in fractionated aliquots. Radiopharmaceutical was  delivered by the interventional radiologist and nuclear radiologist. The patient tolerated procedure well. No adverse effects were noted. Bremsstrahlung planar and SPECT imaging of the abdomen following intrahepatic arterial delivery of Y-90 microsphere was performed. RADIOPHARMACEUTICALS:  10.2 millicuries 725 microspheres. COMPARISON:  MAA 10/17/2018, angiography 10/17/2018, CT 08/27/2018 FINDINGS: Y - 90 microspheres therapy as above. First therapy the right hepatic lobe. Bremsstrahlung planar and SPECT imaging of the abdomen following intrahepatic arterial delivery of Y-57mcrosphere demonstrates radioactivity localized to the RIGHT hepatic lobe. No evidence of extrahepatic  activity. IMPRESSION: Successful Y - 90 microsphere delivery for treatment of unresectable liver metastasis. First therapy to the RIGHT lobe. Segment 5/8 segmentectomy. Bremssstrahlung scan demonstrates activity localized to RIGHT hepatic lobe with no extrahepatic activity identified. Electronically Signed   By: SSuzy BouchardM.D.   On: 11/05/2018 12:35   Nm Special Treatment Procedure  Result Date: 11/05/2018 CLINICAL DATA:  Hepatocellular carcinoma unresectable. Cirrhosis. Radioembolization the RIGHT hepatic lobe with segment 5/8 segmentectomy. EXAM: NUCLEAR MEDICINE SPECIAL MED RAD PHYSICS CONS; NUCLEAR MEDICINE RADIO PHARM THERAPY INTRA ARTERIAL; NUCLEAR MEDICINE TREATMENT PROCEDURE; NUCLEAR MEDICINE LIVER SCAN TECHNIQUE: In conjunction with the interventional radiologist a Y- Microsphere dose was calculated utilizing body surface area formulation. Calculated dose equal 23.2 mCi. Pre therapy MAA liver SPECT scan and CTA were evaluated. Planned segment 5/8 segmentectomy with high (2x) flex dose to the treatment segment. Utilizing a microcatheter system, the segmental RIGHT hepatic artery was selected and Y-90 microspheres were delivered in fractionated aliquots. Radiopharmaceutical was delivered by the interventional radiologist and nuclear radiologist. The patient tolerated procedure well. No adverse effects were noted. Bremsstrahlung planar and SPECT imaging of the abdomen following intrahepatic arterial delivery of Y-90 microsphere was performed. RADIOPHARMACEUTICALS:  236.6millicuries 1440microspheres. COMPARISON:  MAA 10/17/2018, angiography 10/17/2018, CT 08/27/2018 FINDINGS: Y - 90 microspheres therapy as above. First therapy the right hepatic lobe. Bremsstrahlung planar and SPECT imaging of the abdomen following intrahepatic arterial delivery of Y-957mrosphere demonstrates radioactivity localized to the RIGHT hepatic lobe. No evidence of extrahepatic activity. IMPRESSION: Successful Y - 90  microsphere delivery for treatment of unresectable liver metastasis. First therapy to the RIGHT lobe. Segment 5/8 segmentectomy. Bremssstrahlung scan demonstrates activity localized to RIGHT hepatic lobe with no extrahepatic activity identified. Electronically Signed   By: StSuzy Bouchard.D.   On: 11/05/2018 12:35   Ir UsKoreauide Vasc Access Right  Result Date: 11/05/2018 INDICATION: [age]ear old male with a history of hepatocellular carcinoma. He presents today for first treatment of the right liver EXAM: ULTRASOUND-GUIDED COMMON FEMORAL ARTERY ACCESS MESENTERIC ANGIOGRAM DEPLOYMENT OF FIRST TREATMENT Y 90 INTO ANTERIOR SEGMENT OF THE RIGHT HEPATIC ARTERY ANGIO-SEAL DEPLOYMENT FOR HEMOSTASIS MEDICATIONS: 3.375 Zosyn IV. The antibiotic was administered within 1 hour of the procedure ANESTHESIA/SEDATION: Moderate (conscious) sedation was employed during this procedure. A total of Versed 2.0 mg and Fentanyl 100 mcg was administered intravenously. Moderate Sedation Time: 50 minutes. The patient's level of consciousness and vital signs were monitored continuously by radiology nursing throughout the procedure under my direct supervision. CONTRAST:  52 cc Omni 300 FLUOROSCOPY TIME:  Fluoroscopy Time: 3 minutes 54 seconds (123 mGy). COMPLICATIONS: None PROCEDURE: Informed consent was obtained from the patient following explanation of the procedure, risks, benefits and alternatives. The patient understands, agrees and consents for the procedure. All questions were addressed. A time out was performed prior to the initiation of the procedure. Maximal barrier sterile technique utilized including caps, mask, sterile gowns, sterile gloves, large sterile drape, hand hygiene, and Betadine prep. Ultrasound survey  of the right inguinal region was performed with images stored and sent to PACs, confirming patency of the vessel. 1% lidocaine was used for local anesthesia. Stab incision was made with 11 blade scalpel. Blunt  dissection was performed with ultrasound guidance. A micropuncture needle was used access the right common femoral artery under ultrasound. With excellent arterial blood flow returned, and an .018 micro wire was passed through the needle, observed enter the abdominal aorta under fluoroscopy. The needle was removed, and a micropuncture sheath was placed over the wire. The inner dilator and wire were removed, and an 035 Bentson wire was advanced under fluoroscopy into the abdominal aorta. The sheath was removed and a standard 5 [place] vascular sheath was placed. The dilator was removed and the sheath was flushed. Standard C2 Cobra catheter advanced on the Bentson wire to the abdominal aorta. Catheter was used to select the celiac artery origin. Angiogram was performed. Glidewire was advanced, and the catheter was used to select the proper hepatic artery. Angiogram was performed. Catheter was withdrawn to the common hepatic artery. Angiogram was performed. Microcatheter was then advanced into the right hepatic artery for dedicated angiogram of right hepatic artery and the anterior segment of the right hepatic artery. Once we confirmed the catheter position within the anterior segment of the right hepatic artery, the team called for the Y 90 dose. Y 90 dose was then administered with the assistance of Dr. Leonia Reeves. All catheters and wires were disposed properly. Angio-Seal was then deployed for hemostasis at the right common femoral artery access. Patient tolerated the procedure well and remained hemodynamically stable throughout. No complications were encountered and no significant blood loss. IMPRESSION: Status post ultrasound guided access right common femoral artery for mesenteric angiogram and treatment with Y 90 to the infiltrative and focal [place] tumor of the anterior right liver. Status post Angio-Seal for hemostasis. Signed, Dulcy Fanny. Dellia Nims, RPVI Vascular and Interventional Radiology Specialists [facility]  Radiology Electronically Signed   By: Corrie Mckusick D.O.   On: 11/05/2018 12:29   Ir US Guide Vasc Access Right  Result Date: 10/17/2018 INDICATION: 63 year old male with hepatocellular carcinoma presents for Y 90 mapping study. He has a focal tumor in segment 5 with typical imaging characteristics and a more infiltrative tumor throughout the right liver which is biopsy proven to be HCC. Additional hyperenhancing foci of the left liver are nonspecific. AFP greater than 11,000 and rising EXAM: ULTRASOUND GUIDED ACCESS RIGHT COMMON FEMORAL ARTERY MESENTERIC ANGIOGRAM, TARGETING CELIAC ARTERY, SUPERIOR MESENTERIC ARTERY, AND SELECTIVE IN SUPRA SELECTIVE ANGIOGRAM OF HEPATIC ARTERIAL VASCULATURE. EMPIRIC COIL EMBOLIZATION OF GASTRODUODENAL ARTERY AS A PROTECTIVE MEASURE FOR FUTURE Y 90 MAA DOSE FOR FUTURE PLANNING DEPLOYMENT OF EXOSEAL FOR HEMOSTASIS MEDICATIONS: None ANESTHESIA/SEDATION: Moderate (conscious) sedation was employed during this procedure. A total of Versed 6.0 mg and Fentanyl 100 mcg was administered intravenously. Moderate Sedation Time: 89 minutes. The patient's level of consciousness and vital signs were monitored continuously by radiology nursing throughout the procedure under my direct supervision. CONTRAST:  163 cc Omni 300 FLUOROSCOPY TIME:  Fluoroscopy Time: 18 minutes 12 seconds (2,732 mGy). COMPLICATIONS: None PROCEDURE: Informed consent was obtained from the patient following explanation of the procedure, risks, benefits and alternatives. The patient understands, agrees and consents for the procedure. All questions were addressed. A time out was performed prior to the initiation of the procedure. Maximal barrier sterile technique utilized including caps, mask, sterile gowns, sterile gloves, large sterile drape, hand hygiene, and Betadine prep. Ultrasound survey of the  right inguinal region was performed with images stored and sent to PACs, confirming patency of the vessel. A micropuncture  needle was used access the right common femoral artery under ultrasound. With excellent arterial blood flow returned, and an .018 micro wire was passed through the needle, observed enter the abdominal aorta under fluoroscopy. The needle was removed, and a micropuncture sheath was placed over the wire. The inner dilator and wire were removed, and an 035 Bentson wire was advanced under fluoroscopy into the abdominal aorta. The sheath was removed and a standard 5 [place] vascular sheath was placed. The dilator was removed and the sheath was flushed. A Bentson wire was used to introduce a standard C2 Cobra catheter. Cobra catheter was used to select the superior mesenteric artery. Angiogram was performed. Catheter was then advanced to select the celiac artery. Angiogram was performed. Glidewire was then used to navigate the base catheter into the common hepatic artery. Common hepatic artery angiogram was performed. High-flow Progreat catheter was then advanced on an 014 microwire into the right hepatic artery. Angiogram was performed. Catheter was then used to select the anterior and posterior divisions of the right hepatic artery, with super selective angiogram performed of the segment 5 branches, in an attempt to angiographically dissect the arterial anatomy of the segment 5 tumor. Multiple angiogram with multiple projections were performed. During this angiographic dissection, the C2 base catheter buckled into the aorta, with loss of catheter position. Base catheter was then returned to the common hepatic artery with the use of a Glidewire. STC microcatheter was then advanced through the base catheter for selection of the gastroduodenal artery. Angiogram was performed. Empiric coil embolization was then performed of the gastroduodenal artery with a combination of 4 mm diameter, C5 mm diameter, and 6 mm diameter coils to the origin. Repeat angiogram was performed. STC coil was then used to select the left hepatic  artery. Angiogram was performed. Microcatheter was then returned to the proximal right hepatic artery, just beyond the origin of the left hepatic artery, with angiogram confirming location. This was the site of the MAA dose administration. Base catheter microcatheter with then withdrawn, carefully this posing into the proper for waist receptive coal. Chlorhexidine was used to clean the skin, 8 cc of lidocaine were administered to the access site, and EXOSEAL was deployed. Patient tolerated the procedure well and remained hemodynamically stable throughout. No complications were encountered and no significant blood loss. FINDINGS: No significant atherosclerotic changes. SMA patent, with no replaced right hepatic artery or contribution to the ARDS the liver. Portal vein patent. Celiac artery demonstrates standard anatomy, contributing to left gastric artery, splenic artery, common hepatic artery. A very proximal right phrenic artery originates from the origin, which does not appear to significantly supplied tumor. Pancreatic magna arises from the celiac artery. Common hepatic artery injection demonstrates no supra duodenal, retroduodenal, or right gastric artery. Traditional arrangement of the gastroduodenal artery, with pancreaticoduodenal arcade and gastroepiploic artery. Proper hepatic artery angiogram and right hepatic artery angiogram demonstrate proximal division to the anterior and posterior, with a questionable extrahepatic branch originating near the bifurcation of anterior and posterior segments (series 8 and 10.) this runs parallel to the expected course of cystic artery, and may represent cystic artery. Series of injection of super selective arteries from the segment 5 and anterior division of the right hepatic vasculature confirms significant contribution to the segment 5 tumor. No early venous shunting was identified. Tortuous anatomy at this location contributed to loss of base catheter position  and loss  of microcatheter position. Gastroduodenal artery injection demonstrates no right gastric artery, with patent gastroepiploic artery and pancreaticoduodenal arcade. Coil embolization was performed to the origin. Left hepatic artery injection demonstrates no extrahepatic branches with no significant contribution to the right-sided segment 5 tumor. There is a mottled appearance of the left liver parenchyma not appreciated on the prior CT exam. No venous shunting identified. The MAA dose was administered just beyond the left hepatic artery origin within the right hepatic artery. IMPRESSION: Status post ultrasound guided access right common femoral artery for mesenteric angiogram, empiric coil embolization of the GDA, as pre Y 90 mapping study for progressive multifocal HCC. Note that 1 single questionable vessel identified on the right hepatic angiogram which may represent cystic artery versus extrahepatic/supra duodenal branch, and may need interrogation on treatment day. Status post Exoseal deployment. Signed, Dulcy Fanny. Dellia Nims, RPVI [facility] Radiology Electronically Signed   By: Corrie Mckusick D.O.   On: 10/17/2018 12:50   Ir Embo Arterial Not [place] Guide Roadmapping  Result Date: 10/17/2018 INDICATION: 63 year old male with hepatocellular carcinoma presents for Y 90 mapping study. He has a focal tumor in segment 5 with typical imaging characteristics and a more infiltrative tumor throughout the right liver which is biopsy proven to be HCC. Additional hyperenhancing foci of the left liver are nonspecific. AFP greater than 11,000 and rising EXAM: ULTRASOUND GUIDED ACCESS RIGHT COMMON FEMORAL ARTERY MESENTERIC ANGIOGRAM, TARGETING CELIAC ARTERY, SUPERIOR MESENTERIC ARTERY, AND SELECTIVE IN SUPRA SELECTIVE ANGIOGRAM OF HEPATIC ARTERIAL VASCULATURE. EMPIRIC COIL EMBOLIZATION OF GASTRODUODENAL ARTERY AS A PROTECTIVE MEASURE FOR FUTURE Y 90 MAA DOSE FOR  FUTURE PLANNING DEPLOYMENT OF EXOSEAL FOR HEMOSTASIS MEDICATIONS: None ANESTHESIA/SEDATION: Moderate (conscious) sedation was employed during this procedure. A total of Versed 6.0 mg and Fentanyl 100 mcg was administered intravenously. Moderate Sedation Time: 89 minutes. The patient's level of consciousness and vital signs were monitored continuously by radiology nursing throughout the procedure under my direct supervision. CONTRAST:  163 cc Omni 300 FLUOROSCOPY TIME:  Fluoroscopy Time: 18 minutes 12 seconds (2,732 mGy). COMPLICATIONS: None PROCEDURE: Informed consent was obtained from the patient following explanation of the procedure, risks, benefits and alternatives. The patient understands, agrees and consents for the procedure. All questions were addressed. A time out was performed prior to the initiation of the procedure. Maximal barrier sterile technique utilized including caps, mask, sterile gowns, sterile gloves, large sterile drape, hand hygiene, and Betadine prep. Ultrasound survey of the right inguinal region was performed with images stored and sent to PACs, confirming patency of the vessel. A micropuncture needle was used access the right common femoral artery under ultrasound. With excellent arterial blood flow returned, and an .018 micro wire was passed through the needle, observed enter the abdominal aorta under fluoroscopy. The needle was removed, and a micropuncture sheath was placed over the wire. The inner dilator and wire were removed, and an 035 Bentson wire was advanced under fluoroscopy into the abdominal aorta. The sheath was removed and a standard 5 [place] vascular sheath was placed. The dilator was removed and the sheath was flushed. A Bentson wire was used to introduce a standard C2 Cobra catheter. Cobra catheter was used to select the superior mesenteric artery. Angiogram was performed. Catheter was then advanced to select the celiac artery. Angiogram was performed. Glidewire was then  used to navigate the base catheter into the common hepatic artery. Common hepatic artery angiogram was performed. High-flow Progreat catheter was then advanced  on an 014 microwire into the right hepatic artery. Angiogram was performed. Catheter was then used to select the anterior and posterior divisions of the right hepatic artery, with super selective angiogram performed of the segment 5 branches, in an attempt to angiographically dissect the arterial anatomy of the segment 5 tumor. Multiple angiogram with multiple projections were performed. During this angiographic dissection, the C2 base catheter buckled into the aorta, with loss of catheter position. Base catheter was then returned to the common hepatic artery with the use of a Glidewire. STC microcatheter was then advanced through the base catheter for selection of the gastroduodenal artery. Angiogram was performed. Empiric coil embolization was then performed of the gastroduodenal artery with a combination of 4 mm diameter, C5 mm diameter, and 6 mm diameter coils to the origin. Repeat angiogram was performed. STC coil was then used to select the left hepatic artery. Angiogram was performed. Microcatheter was then returned to the proximal right hepatic artery, just beyond the origin of the left hepatic artery, with angiogram confirming location. This was the site of the MAA dose administration. Base catheter microcatheter with then withdrawn, carefully this posing into the proper for waist receptive coal. Chlorhexidine was used to clean the skin, 8 cc of lidocaine were administered to the access site, and EXOSEAL was deployed. Patient tolerated the procedure well and remained hemodynamically stable throughout. No complications were encountered and no significant blood loss. FINDINGS: No significant atherosclerotic changes. SMA patent, with no replaced right hepatic artery or contribution to the ARDS the liver. Portal vein patent. Celiac artery demonstrates  standard anatomy, contributing to left gastric artery, splenic artery, common hepatic artery. A very proximal right phrenic artery originates from the origin, which does not appear to significantly supplied tumor. Pancreatic magna arises from the celiac artery. Common hepatic artery injection demonstrates no supra duodenal, retroduodenal, or right gastric artery. Traditional arrangement of the gastroduodenal artery, with pancreaticoduodenal arcade and gastroepiploic artery. Proper hepatic artery angiogram and right hepatic artery angiogram demonstrate proximal division to the anterior and posterior, with a questionable extrahepatic branch originating near the bifurcation of anterior and posterior segments (series 8 and 10.) this runs parallel to the expected course of cystic artery, and may represent cystic artery. Series of injection of super selective arteries from the segment 5 and anterior division of the right hepatic vasculature confirms significant contribution to the segment 5 tumor. No early venous shunting was identified. Tortuous anatomy at this location contributed to loss of base catheter position and loss of microcatheter position. Gastroduodenal artery injection demonstrates no right gastric artery, with patent gastroepiploic artery and pancreaticoduodenal arcade. Coil embolization was performed to the origin. Left hepatic artery injection demonstrates no extrahepatic branches with no significant contribution to the right-sided segment 5 tumor. There is a mottled appearance of the left liver parenchyma not appreciated on the prior CT exam. No venous shunting identified. The MAA dose was administered just beyond the left hepatic artery origin within the right hepatic artery. IMPRESSION: Status post ultrasound guided access right common femoral artery for mesenteric angiogram, empiric coil embolization of the GDA, as pre Y 90 mapping study for progressive multifocal HCC. Note that 1 single questionable  vessel identified on the right hepatic angiogram which may represent cystic artery versus extrahepatic/supra duodenal branch, and may need interrogation on treatment day. Status post Exoseal deployment. Signed, Dulcy Fanny. Dellia Nims, RPVI Vascular and Interventional Radiology Specialists [facility] Radiology Electronically Signed   By: Corrie Mckusick D.O.   On: 10/17/2018 12:50  Ir Embo Tumor Organ Ischemia Infarct Inc Guide Roadmapping  Result Date: 11/05/2018 INDICATION: 63 year old male with a history of hepatocellular carcinoma. He presents today for first treatment of the right liver EXAM: ULTRASOUND-GUIDED COMMON FEMORAL ARTERY ACCESS MESENTERIC ANGIOGRAM DEPLOYMENT OF FIRST TREATMENT Y 90 INTO ANTERIOR SEGMENT OF THE RIGHT HEPATIC ARTERY ANGIO-SEAL DEPLOYMENT FOR HEMOSTASIS MEDICATIONS: 3.375 Zosyn IV. The antibiotic was administered within 1 hour of the procedure ANESTHESIA/SEDATION: Moderate (conscious) sedation was employed during this procedure. A total of Versed 2.0 mg and Fentanyl 100 mcg was administered intravenously. Moderate Sedation Time: 50 minutes. The patient's level of consciousness and vital signs were monitored continuously by radiology nursing throughout the procedure under my direct supervision. CONTRAST:  52 cc Omni 300 FLUOROSCOPY TIME:  Fluoroscopy Time: 3 minutes 54 seconds (123 mGy). COMPLICATIONS: None PROCEDURE: Informed consent was obtained from the patient following explanation of the procedure, risks, benefits and alternatives. The patient understands, agrees and consents for the procedure. All questions were addressed. A time out was performed prior to the initiation of the procedure. Maximal barrier sterile technique utilized including caps, mask, sterile gowns, sterile gloves, large sterile drape, hand hygiene, and Betadine prep. Ultrasound survey of the right inguinal region was performed with images stored and sent to PACs, confirming patency of the vessel. 1% lidocaine was  used for local anesthesia. Stab incision was made with 11 blade scalpel. Blunt dissection was performed with ultrasound guidance. A micropuncture needle was used access the right common femoral artery under ultrasound. With excellent arterial blood flow returned, and an .018 micro wire was passed through the needle, observed enter the abdominal aorta under fluoroscopy. The needle was removed, and a micropuncture sheath was placed over the wire. The inner dilator and wire were removed, and an 035 Bentson wire was advanced under fluoroscopy into the abdominal aorta. The sheath was removed and a standard 5 [place] vascular sheath was placed. The dilator was removed and the sheath was flushed. Standard C2 Cobra catheter advanced on the Bentson wire to the abdominal aorta. Catheter was used to select the celiac artery origin. Angiogram was performed. Glidewire was advanced, and the catheter was used to select the proper hepatic artery. Angiogram was performed. Catheter was withdrawn to the common hepatic artery. Angiogram was performed. Microcatheter was then advanced into the right hepatic artery for dedicated angiogram of right hepatic artery and the anterior segment of the right hepatic artery. Once we confirmed the catheter position within the anterior segment of the right hepatic artery, the team called for the Y 90 dose. Y 90 dose was then administered with the assistance of Dr. Leonia Reeves. All catheters and wires were disposed properly. Angio-Seal was then deployed for hemostasis at the right common femoral artery access. Patient tolerated the procedure well and remained hemodynamically stable throughout. No complications were encountered and no significant blood loss. IMPRESSION: Status post ultrasound guided access right common femoral artery for mesenteric angiogram and treatment with Y 90 to the infiltrative and focal [place] tumor of the anterior right liver. Status post Angio-Seal for hemostasis. Signed, Dulcy Fanny.  Dellia Nims, RPVI Vascular and Interventional Radiology Specialists [facility] Radiology Electronically Signed   By: Corrie Mckusick D.O.   On: 11/05/2018 12:29   Nm Radio Pharm Therapy Intraarterial  Result Date: 11/05/2018 CLINICAL DATA:  Hepatocellular carcinoma unresectable. Cirrhosis. Radioembolization the RIGHT hepatic lobe with segment 5/8 segmentectomy. EXAM: NUCLEAR MEDICINE SPECIAL MED RAD PHYSICS CONS; NUCLEAR MEDICINE RADIO PHARM THERAPY INTRA ARTERIAL; NUCLEAR MEDICINE TREATMENT PROCEDURE; NUCLEAR MEDICINE LIVER  SCAN TECHNIQUE: In conjunction with the interventional radiologist a Y- Microsphere dose was calculated utilizing body surface area formulation. Calculated dose equal 23.2 mCi. Pre therapy MAA liver SPECT scan and CTA were evaluated. Planned segment 5/8 segmentectomy with high (2x) flex dose to the treatment segment. Utilizing a microcatheter system, the segmental RIGHT hepatic artery was selected and Y-90 microspheres were delivered in fractionated aliquots. Radiopharmaceutical was delivered by the interventional radiologist and nuclear radiologist. The patient tolerated procedure well. No adverse effects were noted. Bremsstrahlung planar and SPECT imaging of the abdomen following intrahepatic arterial delivery of Y-90 microsphere was performed. RADIOPHARMACEUTICALS:  01.0 millicuries 272 microspheres. COMPARISON:  MAA 10/17/2018, angiography 10/17/2018, CT 08/27/2018 FINDINGS: Y - 90 microspheres therapy as above. First therapy the right hepatic lobe. Bremsstrahlung planar and SPECT imaging of the abdomen following intrahepatic arterial delivery of Y-12mcrosphere demonstrates radioactivity localized to the RIGHT hepatic lobe. No evidence of extrahepatic activity. IMPRESSION: Successful Y - 90 microsphere delivery for treatment of unresectable liver metastasis. First therapy to the RIGHT lobe. Segment 5/8 segmentectomy. Bremssstrahlung scan demonstrates activity localized to RIGHT hepatic  lobe with no extrahepatic activity identified. Electronically Signed   By: SSuzy BouchardM.D.   On: 11/05/2018 12:35   Nm Fusion  Result Date: 10/17/2018 CLINICAL DATA:  Hepatocellular carcinoma. Radioembolization pre therapy evaluation EXAM: NUCLEAR MEDICINE LIVER SCAN; ULTRASOUND MISCELLANEOUS SOFT TISSUE TECHNIQUE: Abdominal images were obtained in multiple projections after intrahepatic arterial injection of radiopharmaceutical. SPECT imaging was performed. Lung shunt calculation was performed. RADIOPHARMACEUTICALS:  5.4 millicuries technetium 99 MAA COMPARISON:  None. FINDINGS: The injected microaggregated albumin localizes within the RIGHT hepatic lobe. No evidence of activity within the stomach, duodenum, or bowel. Calculated shunt fraction to the lungs equals 5.7%. IMPRESSION: 1. No significant extrahepatic radiotracer activity following intrahepatic arterial injection of MAA. 2. Lung shunt fraction equals 5.7% Electronically Signed   By: SSuzy BouchardM.D.   On: 10/17/2018 16:49    ASSESSMENT: Hepatocellular carcinoma:  PLAN:    1.  Hepatocellular carcinoma: MRI results from April 20, 2018 reviewed independently with 4 cm mass in patient's liver.  Patient was evaluated for liver transplant and this was determined not to be an option. He did undergo Y-90 ablation on November 05, 2018.  His pre-ablation AFP was greater than 33,000.  Today's result is pending.  No intervention is needed at this time.  Patient does not require systemic therapy currently, but may reconsider in the future.  Return to clinic in 6 weeks for laboratory work only and then in 3 months with repeat imaging and further evaluation. 2.  Hepatitis B/hepatitis C: Continue follow up with ID and GI as scheduled.  3.  Thrombocytopenia: Improved to 107.  Likely secondary to cirrhosis.  Patient's spleen measures 14.4 cm which is the upper limit of normal.  Continue to monitor. 4.  Anemia: Resolved.  Patient expressed  understanding and was in agreement with this plan. He also understands that He can call clinic at any time with any questions, concerns, or complaints.   Cancer Staging Hepatocellular carcinoma ([facility] Staging form: Liver, AJCC 8th Edition - Clinical stage from 05/17/2018: Stage IB (cT1b, cN0, cM0) - Signed by FLloyd Huger MD on 05/17/2018   TLloyd Huger MD   11/12/2018 2:23 PM

## 2018-11-12 ENCOUNTER — Other Ambulatory Visit: Payer: Self-pay

## 2018-11-12 ENCOUNTER — Inpatient Hospital Stay: Payer: Medicare Other | Attending: Oncology

## 2018-11-12 ENCOUNTER — Inpatient Hospital Stay (HOSPITAL_BASED_OUTPATIENT_CLINIC_OR_DEPARTMENT_OTHER): Payer: Medicare Other | Admitting: Oncology

## 2018-11-12 ENCOUNTER — Other Ambulatory Visit: Payer: Medicare Other

## 2018-11-12 ENCOUNTER — Ambulatory Visit: Payer: Medicare Other | Admitting: Oncology

## 2018-11-12 ENCOUNTER — Encounter: Payer: Self-pay | Admitting: Oncology

## 2018-11-12 VITALS — BP 104/69 | HR 81 | Temp 98.7°F | Ht 67.0 in | Wt 141.0 lb

## 2018-11-12 DIAGNOSIS — B1920 Unspecified viral hepatitis C without hepatic coma: Secondary | ICD-10-CM | POA: Diagnosis not present

## 2018-11-12 DIAGNOSIS — Z720 Tobacco use: Secondary | ICD-10-CM

## 2018-11-12 DIAGNOSIS — B1910 Unspecified viral hepatitis B without hepatic coma: Secondary | ICD-10-CM

## 2018-11-12 DIAGNOSIS — C220 Liver cell carcinoma: Secondary | ICD-10-CM

## 2018-11-12 DIAGNOSIS — R531 Weakness: Secondary | ICD-10-CM

## 2018-11-12 DIAGNOSIS — R5382 Chronic fatigue, unspecified: Secondary | ICD-10-CM | POA: Insufficient documentation

## 2018-11-12 DIAGNOSIS — D696 Thrombocytopenia, unspecified: Secondary | ICD-10-CM | POA: Insufficient documentation

## 2018-11-12 LAB — COMPREHENSIVE METABOLIC PANEL
ALT: 62 U/L — ABNORMAL HIGH (ref 0–44)
AST: 196 U/L — ABNORMAL HIGH (ref 15–41)
Albumin: 2.9 g/dL — ABNORMAL LOW (ref 3.5–5.0)
Alkaline Phosphatase: 179 U/L — ABNORMAL HIGH (ref 38–126)
Anion gap: 9 (ref 5–15)
BUN: 20 mg/dL (ref 8–23)
CO2: 22 mmol/L (ref 22–32)
Calcium: 9 mg/dL (ref 8.9–10.3)
Chloride: 100 mmol/L (ref 98–111)
Creatinine, Ser: 0.9 mg/dL (ref 0.61–1.24)
GFR calc Af Amer: >60 mL/min (ref >60)
GFR calc non Af Amer: >60 mL/min (ref >60)
Glucose, Bld: 109 mg/dL — ABNORMAL HIGH (ref 70–99)
Potassium: 4.5 mmol/L (ref 3.5–5.1)
Sodium: 131 mmol/L — ABNORMAL LOW (ref 135–145)
Total Bilirubin: 2.8 mg/dL — ABNORMAL HIGH (ref 0.3–1.2)
Total Protein: 9.5 g/dL — ABNORMAL HIGH (ref 6.5–8.1)

## 2018-11-12 LAB — CBC WITH DIFFERENTIAL/PLATELET
Abs Immature Granulocytes: 0.07 10*3/uL (ref 0.00–0.07)
Basophils Absolute: 0 10*3/uL (ref 0.0–0.1)
Basophils Relative: 0 %
Eosinophils Absolute: 0.1 10*3/uL (ref 0.0–0.5)
Eosinophils Relative: 1 %
HCT: 40.5 % (ref 39.0–52.0)
Hemoglobin: 13.2 g/dL (ref 13.0–17.0)
Immature Granulocytes: 1 %
Lymphocytes Relative: 15 %
Lymphs Abs: 0.7 10*3/uL (ref 0.7–4.0)
MCH: 31.3 pg (ref 26.0–34.0)
MCHC: 32.6 g/dL (ref 30.0–36.0)
MCV: 96 fL (ref 80.0–100.0)
Monocytes Absolute: 0.4 10*3/uL (ref 0.1–1.0)
Monocytes Relative: 9 %
Neutro Abs: 3.6 10*3/uL (ref 1.7–7.7)
Neutrophils Relative %: 74 %
Platelets: 107 10*3/uL — ABNORMAL LOW (ref 150–400)
RBC: 4.22 MIL/uL (ref 4.22–5.81)
RDW: 16.5 % — ABNORMAL HIGH (ref 11.5–15.5)
WBC: 4.9 10*3/uL (ref 4.0–10.5)
nRBC: 0 % (ref 0.0–0.2)

## 2018-11-12 NOTE — Progress Notes (Signed)
Patient stated that he has had abdominal pain by his liver. Patient also stated that he had stopped taking his Diflucan because it will cause him to have worse abdominal pain and diarrhea.

## 2018-11-13 ENCOUNTER — Telehealth: Payer: Self-pay | Admitting: *Deleted

## 2018-11-13 LAB — AFP TUMOR MARKER: AFP, Serum, Tumor Marker: 39914 ng/mL — ABNORMAL HIGH (ref 0.0–8.3)

## 2018-11-13 NOTE — Telephone Encounter (Signed)
Sister Fayette Pho called requesting a return call to discuss yesterdays appointment and to let her know when ot is to return for next appointment. Her call back is [phone]

## 2018-11-15 ENCOUNTER — Telehealth: Payer: Self-pay

## 2018-11-15 NOTE — Telephone Encounter (Signed)
Called patient's sister Mrs. Powell and did not answer. However, I was not able to leave her a voicemail. I will try to call her back on Monday.

## 2018-11-18 NOTE — Telephone Encounter (Signed)
Called patient's sister-Mrs. Powell back and she stated that she wanted to know what was her brother's appointment. With Dr. Grayland Ormond. I told her that he will need labs in 6 weeks and then see him back in 3 months with a MRI of his liver. Mrs. Florene Glen stated that his brother was scheduled to go to [facility] for an appointment and wanted to know why. I told her that she would have to contact their office to ask questions. Phone number was provided to her [phone]). Mrs. Florene Glen had no further questions.

## 2018-11-25 MED ORDER — IOHEXOL 300 MG/ML  SOLN
100.0000 mL | Freq: Once | INTRAMUSCULAR | Status: AC | PRN
  Administered 2018-11-05: 12:00:00 52 mL via INTRA_ARTERIAL

## 2018-12-02 ENCOUNTER — Emergency Department: Payer: Medicare Other

## 2018-12-02 ENCOUNTER — Emergency Department
Admission: EM | Admit: 2018-12-02 | Discharge: 2018-12-02 | Disposition: A | Discharge status: Home / Self Care | Payer: Medicare Other | Attending: Emergency Medicine | Admitting: Emergency Medicine

## 2018-12-02 ENCOUNTER — Other Ambulatory Visit: Payer: Self-pay

## 2018-12-02 DIAGNOSIS — M545 Low back pain, unspecified: Secondary | ICD-10-CM

## 2018-12-02 DIAGNOSIS — F17200 Nicotine dependence, unspecified, uncomplicated: Secondary | ICD-10-CM | POA: Diagnosis not present

## 2018-12-02 DIAGNOSIS — Z79899 Other long term (current) drug therapy: Secondary | ICD-10-CM | POA: Insufficient documentation

## 2018-12-02 MED ORDER — KETOROLAC TROMETHAMINE 30 MG/ML IJ SOLN
30.0000 mg | Freq: Once | INTRAMUSCULAR | Status: AC
  Administered 2018-12-02: 30 mg via INTRAMUSCULAR
  Filled 2018-12-02: qty 1

## 2018-12-02 MED ORDER — PREDNISONE 10 MG (21) PO TBPK: ORAL_TABLET | ORAL | 0 refills | Status: DC

## 2018-12-02 MED ORDER — HYDROCODONE-ACETAMINOPHEN 5-325 MG PO TABS: 1.0000 | ORAL_TABLET | Freq: Once | ORAL | Status: DC

## 2018-12-02 MED ORDER — CYCLOBENZAPRINE HCL 10 MG PO TABS: 10.0000 mg | ORAL_TABLET | Freq: Three times a day (TID) | ORAL | 0 refills | Status: DC | PRN

## 2018-12-02 MED ORDER — OXYCODONE-ACETAMINOPHEN 5-325 MG PO TABS
1.0000 | ORAL_TABLET | ORAL | Status: AC | PRN
  Administered 2018-12-02 (×2): 1 via ORAL
  Filled 2018-12-02 (×2): qty 1

## 2018-12-02 NOTE — ED Notes (Signed)
See triage note   States he felt a "pop" in back this am when he was trying to get up    States pain is mainly in lower back

## 2018-12-02 NOTE — ED Triage Notes (Signed)
Pt states he got out of bed this am when he heard a "crack" in his lower back and began to experience back pain. Pt states he is in too much pain to sit. Pt appears uncomfortable. Is able to move feet.

## 2018-12-02 NOTE — ED Provider Notes (Signed)
[facility] Emergency Department Provider Note  ____________________________________________   First MD Initiated Contact with Patient 12/02/18 [phone]     (approximate)  I have reviewed the triage vital signs and the nursing notes.   HISTORY  Chief Complaint Back Pain    HPI Andrew Gibbs is a 63 y.o. male presents to the emergency department with C/o low back pain for 1 day, no known injury, states he felt a pop in his lower back when getting up to get a glass of water.  States his legs feel numb.  Pain is worse with movement, increased with bending over, denies tingling, or changes in bowel/urinary habits,  Using otc meds without relief Remainder ros neg   Past Medical History:  Diagnosis Date  . Blood dyscrasia    thrombocytopenia  . Hepatitis C   . Heroin use     Patient Active Problem List   Diagnosis Date Noted  . Candidemia ([place]) 07/23/2018  . Discitis of cervical region 07/23/2018  . 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 Hepatocellular carcinoma ([place]) 05/17/2018  . Malnutrition of moderate degree 04/28/2018  . Abscess of right arm 04/17/2018    Past Surgical History:  Procedure Laterality Date  . IR ANGIOGRAM SELECTIVE EACH ADDITIONAL VESSEL  10/17/2018  . IR ANGIOGRAM SELECTIVE EACH ADDITIONAL VESSEL  10/17/2018  . IR ANGIOGRAM SELECTIVE EACH ADDITIONAL VESSEL  10/17/2018  . IR ANGIOGRAM SELECTIVE EACH ADDITIONAL VESSEL  10/17/2018  . IR ANGIOGRAM SELECTIVE EACH ADDITIONAL VESSEL  10/17/2018  . IR ANGIOGRAM SELECTIVE EACH ADDITIONAL VESSEL  10/17/2018  . IR ANGIOGRAM SELECTIVE EACH ADDITIONAL VESSEL  10/17/2018  . IR ANGIOGRAM SELECTIVE EACH ADDITIONAL VESSEL  11/05/2018  . IR ANGIOGRAM SELECTIVE EACH ADDITIONAL VESSEL  11/05/2018  . IR ANGIOGRAM SELECTIVE EACH ADDITIONAL VESSEL  11/05/2018  . IR ANGIOGRAM VISCERAL SELECTIVE  10/17/2018  . IR ANGIOGRAM VISCERAL SELECTIVE  10/17/2018  . IR ANGIOGRAM VISCERAL SELECTIVE  11/05/2018  . IR EMBO  ARTERIAL NOT HEMORR HEMANG INC GUIDE ROADMAPPING  10/17/2018  . IR EMBO TUMOR ORGAN ISCHEMIA INFARCT INC GUIDE ROADMAPPING  11/05/2018  . IR RADIOLOGIST EVAL & MGMT  05/22/2018  . IR RADIOLOGIST EVAL & MGMT  08/27/2018  . IR US GUIDE VASC ACCESS RIGHT  10/17/2018  . IR US GUIDE VASC ACCESS RIGHT  11/05/2018  . KNEE SURGERY    . TEE WITHOUT CARDIOVERSION N/A 04/22/2018   Procedure: TRANSESOPHAGEAL ECHOCARDIOGRAM (TEE);  Surgeon: Teodoro Spray, MD;  Location: [facility] ORS;  Service: Cardiovascular;  Laterality: N/A;    Prior to Admission medications   Medication Sig Start Date End Date Taking? Authorizing Provider  cyclobenzaprine (FLEXERIL) 10 MG tablet Take 1 tablet (10 mg total) by mouth 3 (three) times daily as needed. 12/02/18   Fisher, Linden Dolin, PA-C  furosemide (LASIX) 20 MG tablet Take 1 tablet (20 mg total) by mouth daily. 07/29/18 11/12/18  Jonathon Bellows, MD  Multiple Vitamin (MULTIVITAMIN WITH MINERALS) TABS tablet Take 1 tablet by mouth daily. 05/03/18   Epifanio Lesches, MD  pantoprazole (PROTONIX) 40 MG tablet Take 1 tablet (40 mg total) by mouth daily. 11/05/18   Corrie Mckusick, DO  predniSONE (STERAPRED UNI-PAK 21 TAB) 10 MG (21) TBPK tablet Take 6 pills on day one then decrease by 1 pill each day 12/02/18   Versie Starks, PA-C  spironolactone (ALDACTONE) 50 MG tablet Take 1 tablet (50 mg total) by mouth daily. 07/29/18 11/12/18  Jonathon Bellows, MD    Allergies Patient has  no known allergies.  Family History  Problem Relation Age of Onset  . Multiple myeloma Mother     Social History Social History   Tobacco Use  . Smoking status: Current Every Day Smoker    Packs/day: 0.50    Years: 40.00    Pack years: 20.00  . Smokeless tobacco: Never Used  Substance Use Topics  . Alcohol use: Not Currently    Comment: 40 oz today  . Drug use: Not Currently    Types: IV, Cocaine, Heroin, Marijuana    Comment: heroin, states he used to use cocaine    Review of Systems  Constitutional: No  fever/chills Eyes: No visual changes. ENT: No sore throat. Respiratory: Denies cough Genitourinary: Negative for dysuria. Musculoskeletal: Positive for back pain. Skin: Negative for rash.    ____________________________________________   PHYSICAL EXAM:  VITAL SIGNS: ED Triage Vitals [12/02/18 0546]  Enc Vitals Group     BP 110/60     Pulse Rate 66     Resp 16     Temp 98.2 F (36.8 C)     Temp Source Oral     SpO2 99 %     Weight 130 lb (59 kg)     Height _0  (1.702 m)     Head Circumference      Peak Flow      Pain Score 8     Pain Loc      Pain Edu?      Excl. in [place]?     Constitutional: Alert and oriented. Well appearing and in no acute distress.  Patient sleeping comfortably on his abdomen and rolls over to his side without difficulty Eyes: Conjunctivae are normal.  Head: Atraumatic. Nose: No congestion/rhinnorhea. Mouth/Throat: Mucous membranes are moist.   Neck:  supple no lymphadenopathy noted Cardiovascular: Normal rate, regular rhythm. Heart sounds are normal Respiratory: Normal respiratory effort.  No retractions, lungs c t a  GU: deferred Musculoskeletal: FROM all extremities, warm and well perfused.  Decreased rom of back due to discomfort, lumbar spine tender along the lower lumbar spine, unable to assess SLR as patient wants to lay on his side, full 5/5 strength in great toes b/l, 5/5 strength in lower legs, n/v intact Neurologic:  Normal speech and language.  Skin:  Skin is warm, dry and intact. No rash noted. Psychiatric: Mood and affect are normal. Speech and behavior are normal.  ____________________________________________   LABS (all labs ordered are listed, but only abnormal results are displayed)  Labs Reviewed - No data to display ____________________________________________   ____________________________________________  RADIOLOGY  X-ray of the lumbar spine and sacrum/coccyx are both normal Patient refuses MRI   ____________________________________________   PROCEDURES  Procedure(s) performed: Toradol 30 mg IM   Procedures    ____________________________________________   INITIAL IMPRESSION / ASSESSMENT AND PLAN / ED COURSE  Pertinent labs & imaging results that were available during my care of the patient were reviewed by me and considered in my medical decision making (see chart for details).   Patient 63 year old male presents emergency department after feeling a pop in his lower back.  States he feels like his legs went numb.  Patient has a history of hep C and heroin abuse.  He denies any fever or chills.  Physical exam patient is sleeping soundly on his abdomen when I went into the room.  Had to talk very loudly 2 or 3 times to get him to wake up.  Patient then rolled to his side  without difficulty.  Lumbar spine is minimally tender.  Patient is able to move his toes without difficulty.  Is full strength in the lower extremity.  Toradol 30 mg IM.  When I left the room the patient asked for me to turn the lights off so he can go back to sleep.   Patient walked to the bathroom without any difficulty.  Then he went back to the room and started moaning.  Offered to do a MRI which patient refused.  He states he just wants something stronger for pain.  He was given an additional Percocet to the standing order that was given earlier.  He was discharged in stable condition with a prescription for Sterapred and Flexeril.  As part of my medical decision making, I reviewed the following data within the [place] notes reviewed and incorporated, Old chart reviewed, Radiograph reviewed x-ray lumbar spine and sacrum/coccyx negative, Notes from prior ED visits and [place] Controlled Substance Database  ____________________________________________   FINAL CLINICAL IMPRESSION(S) / ED DIAGNOSES  Final diagnoses:  Acute midline low back pain without sciatica      NEW  MEDICATIONS STARTED DURING THIS VISIT:  New Prescriptions   CYCLOBENZAPRINE (FLEXERIL) 10 MG TABLET    Take 1 tablet (10 mg total) by mouth 3 (three) times daily as needed.   PREDNISONE (STERAPRED UNI-PAK 21 TAB) 10 MG (21) TBPK TABLET    Take 6 pills on day one then decrease by 1 pill each day     Note:  This document was prepared using Dragon voice recognition software and may include unintentional dictation errors.     Versie Starks, PA-C 12/02/18 0805    Earleen Newport, MD 12/02/18 [phone]

## 2018-12-02 NOTE — Discharge Instructions (Addendum)
Follow-up with your regular doctor or Dr. Mack Guise if continued back pain.  Take medications as prescribed.  Return emergency department worsening.  Apply ice to the lower back.

## 2018-12-03 ENCOUNTER — Ambulatory Visit (HOSPITAL_COMMUNITY): Payer: Medicare Other

## 2018-12-03 ENCOUNTER — Other Ambulatory Visit (HOSPITAL_COMMUNITY): Payer: Medicare Other

## 2018-12-03 ENCOUNTER — Telehealth: Payer: Self-pay | Admitting: *Deleted

## 2018-12-03 NOTE — Telephone Encounter (Signed)
Call returned to patients sister regarding home health request. I spoke with patients sister and she states due to worsening back pain and weakness that patient is not able to do things around the house. I explained to her that home health would involve nursing, social work and potentially PT/OT but if an aide or someone to do house work is what they were looking for that's not what home health would be able to provide. I advised her to call insurance company to see what they will cover for home care services. She will discuss with patient and call back to let us know if he is interested in home health.

## 2018-12-19 ENCOUNTER — Other Ambulatory Visit: Payer: Self-pay | Admitting: *Deleted

## 2018-12-19 ENCOUNTER — Telehealth: Payer: Self-pay | Admitting: *Deleted

## 2018-12-19 ENCOUNTER — Other Ambulatory Visit: Payer: Self-pay

## 2018-12-19 ENCOUNTER — Ambulatory Visit (INDEPENDENT_AMBULATORY_CARE_PROVIDER_SITE_OTHER): Payer: Medicare Other | Admitting: Gastroenterology

## 2018-12-19 ENCOUNTER — Telehealth: Payer: Self-pay | Admitting: Gastroenterology

## 2018-12-19 DIAGNOSIS — R188 Other ascites: Secondary | ICD-10-CM

## 2018-12-19 DIAGNOSIS — K7460 Unspecified cirrhosis of liver: Secondary | ICD-10-CM

## 2018-12-19 DIAGNOSIS — K921 Melena: Secondary | ICD-10-CM

## 2018-12-19 MED ORDER — TRAMADOL HCL 50 MG PO TABS: 50.0000 mg | ORAL_TABLET | Freq: Four times a day (QID) | ORAL | 0 refills | Status: DC | PRN

## 2018-12-19 NOTE — Telephone Encounter (Signed)
Sent to your inbasket

## 2018-12-19 NOTE — Telephone Encounter (Signed)
We can call in tramadol.  Would like him to avoid nsaids and tylenol if he can.

## 2018-12-19 NOTE — Telephone Encounter (Signed)
Done

## 2018-12-19 NOTE — Telephone Encounter (Signed)
Windell Moment calling about patient's instructions. She had not received them yet. Please email them to [email or web address] or [email or web address].

## 2018-12-19 NOTE — Telephone Encounter (Signed)
Patient sister [name] called reporting that patient is having back pain and she wants to know what he can take for it either prescription or over the counter. Please advise

## 2018-12-19 NOTE — Progress Notes (Signed)
Andrew Gibbs , MD [address]  [place]  [place], [place] [address]  Main: [phone]  Fax: [phone]   Primary Care Physician: [facility]  Virtual Visit via Video Note  I connected with patient on 12/19/18 at  9:00 AM EDT by video and verified that I am speaking with the correct person using two identifiers.   I discussed the limitations, risks, security and privacy concerns of performing an evaluation and management service by video  and the availability of in person appointments. I also discussed with the patient that there may be a patient responsible charge related to this service. The patient expressed understanding and agreed to proceed.  Location of Patient: Home Location of Provider: Home Persons involved: Patient and provider only   History of Present Illness: Chief Complaint  Patient presents with   Melena    HPI: Andrew Gibbs is a 63 y.o. male  Summary of history : Initially seen in January 2020 for chronic hepatitis C.  Diagnosed with decompensated liver cirrhosis with ascites.  Meld score of 13. He sees Dr. Grayland Ormond in oncology for hepatocellular carcinoma with a 4 cm mass.  MRI showed ascites bilateral pleural effusions and paraesophageal varices.  He had a history of active IV drug use but he quit in October 2019.  He also used to drink a lot of alcohol that he quit at the same time.  When he did drink alcohol it was to the point that he would pass out.[name]  He had been treated for hepatitis C in 1976  Interval history   07/29/2018-12/19/2018  In March 2020 his case was discussed at the [facility] hepatobiliary conference and there was some concern for autoimmune hepatitis.  The plan was to work him up for the same unclear what happened subsequently.  It appears that they try to contact him subsequently but they could not get in touch with him.  Subsequently on 12/02/2018 he had a telephone visit with the hepatobiliary team.  He was beyond the  midline criteria and hence was not a candidate for liver transplant.  And was suggested to undergo TACE plus or minus systemic therapy.  At that point there were some concern for melena and was advised to undergo an EGD locally.   When he was evaluated in January 2020 he had no hepatitis C virus detected in his blood hence does not have active infection.  Hepatitis B E antigen negative, hepatitis B core AB total antibody positive, hepatitis B E antibody positive.  Hepatitis A antibody positive.  In April 2020 his hemoglobin was 13.2 g.  He said he had black stools on one occasion a week back , did not throw up. Denies use of any blood thinners.    Current Outpatient Medications  Medication Sig Dispense Refill   cyclobenzaprine (FLEXERIL) 10 MG tablet Take 1 tablet (10 mg total) by mouth 3 (three) times daily as needed. 30 tablet 0   fluconazole (DIFLUCAN) 200 MG tablet 200 mg.     LASIX 20 MG tablet      Multiple Vitamin (MULTIVITAMIN WITH MINERALS) TABS tablet Take 1 tablet by mouth daily. 30 tablet 0   pantoprazole (PROTONIX) 40 MG tablet Take 1 tablet (40 mg total) by mouth daily. 30 tablet 2   furosemide (LASIX) 20 MG tablet Take 1 tablet (20 mg total) by mouth daily. 90 tablet 1   predniSONE (STERAPRED UNI-PAK 21 TAB) 10 MG (21) TBPK tablet Take 6 pills on  day one then decrease by 1 pill each day (Patient not taking: Reported on 12/19/2018) 21 tablet 0   spironolactone (ALDACTONE) 50 MG tablet Take 1 tablet (50 mg total) by mouth daily. 60 tablet 0   No current facility-administered medications for this visit.     Allergies as of 12/19/2018   (No Known Allergies)    Review of Systems:    All systems reviewed and negative except where noted in HPI.  General Appearance:    Alert, cooperative, no distress, appears stated age  Head:    Normocephalic, without obvious abnormality, atraumatic  Eyes:    PERRL, conjunctiva/corneas clear,  Ears:    Grossly normal hearing     Neurologic:  Grossly normal    Observations/Objective:  Labs: CMP     Component Value Date/Time   NA 131 (L) 11/12/2018 1011   NA 142 07/29/2018 1441   NA 141 01/21/2014 1700   K 4.5 11/12/2018 1011   K 4.0 01/21/2014 1700   CL 100 11/12/2018 1011   CL 105 01/21/2014 1700   CO2 22 11/12/2018 1011   CO2 25 01/21/2014 1700   GLUCOSE 109 (H) 11/12/2018 1011   GLUCOSE 95 01/21/2014 1700   BUN 20 11/12/2018 1011   BUN 7 (L) 07/29/2018 1441   BUN 9 01/21/2014 1700   CREATININE 0.90 11/12/2018 1011   CREATININE 0.90 01/21/2014 1700   CALCIUM 9.0 11/12/2018 1011   CALCIUM 8.1 (L) 01/21/2014 1700   PROT 9.5 (H) 11/12/2018 1011   PROT 8.2 07/29/2018 1441   PROT 8.9 (H) 01/21/2014 1700   ALBUMIN 2.9 (L) 11/12/2018 1011   ALBUMIN 3.0 (L) 07/29/2018 1441   ALBUMIN 3.4 01/21/2014 1700   AST 196 (H) 11/12/2018 1011   AST 80 (H) 01/21/2014 1700   ALT 62 (H) 11/12/2018 1011   ALT 28 01/21/2014 1700   ALKPHOS 179 (H) 11/12/2018 1011   ALKPHOS 103 01/21/2014 1700   BILITOT 2.8 (H) 11/12/2018 1011   BILITOT 1.4 (H) 07/29/2018 1441   BILITOT 0.6 01/21/2014 1700   GFRNONAA >60 11/12/2018 1011   GFRNONAA >60 01/21/2014 1700   GFRAA >60 11/12/2018 1011   GFRAA >60 01/21/2014 1700   Lab Results  Component Value Date   WBC 4.9 11/12/2018   HGB 13.2 11/12/2018   HCT 40.5 11/12/2018   MCV 96.0 11/12/2018   PLT 107 (L) 11/12/2018    Imaging Studies: Dg Lumbar Spine Complete  Result Date: 12/02/2018 CLINICAL DATA:  Lower back pain beginning this morning EXAM: LUMBAR SPINE - COMPLETE 4+ VIEW COMPARISON:  08/27/2018 abdominal CT FINDINGS: No evidence of lumbar spine fracture or erosion. Alignment and disc height preserved. Cholelithiasis. There is splenomegaly with known underlying cirrhosis. GDA coil embolization. IMPRESSION: Negative lumbar spine. Electronically Signed   By: Monte Fantasia M.D.   On: 12/02/2018 06:35   Dg Sacrum/coccyx  Result Date: 12/02/2018 CLINICAL DATA:   Back pain beginning this morning after getting out of bed. EXAM: SACRUM AND COCCYX - 2+ VIEW COMPARISON:  None. FINDINGS: There is no evidence of fracture or other focal bone lesions. IMPRESSION: Negative. Electronically Signed   By: Monte Fantasia M.D.   On: 12/02/2018 06:36    Assessment and Plan:   LONNEY REVAK is a 63 y.o. y/o male here to see me for an upper endoscopy.  This was mentioned when he was seen by his transplant hepatologist at [facility] in May 2020 and had some complaints of melena.  He has multifocal HCC hence  not a candidate for liver transplant when recently seen and evaluated.  He undergoes treatment with Dr. Grayland Ormond for [facility].  He does not have hepatitis C.  He has features suggestive of prior hepatitis B exposure.Referred back for melena    Plan :   1. Check CBC, INR, CMP. 2. EGD next week with urine drug screen when he presents to the hospital.Will rule out varices  I have discussed alternative options, risks & benefits,  which include, but are not limited to, bleeding, infection, perforation,respiratory complication & drug reaction.  The patient agrees with this plan & written consent will be obtained.      Follow Up Instructions:    I discussed the assessment and treatment plan with the patient. The patient was provided an opportunity to ask questions and all were answered. The patient agreed with the plan and demonstrated an understanding of the instructions.   The patient was advised to call back or seek an in-person evaluation if the symptoms worsen or if the condition fails to improve as anticipated.    Dr Andrew Bellows MD,MRCP [facility]) Gastroenterology/Hepatology Pager: [phone]   Speech recognition software was used to dictate this note.

## 2018-12-20 ENCOUNTER — Other Ambulatory Visit: Payer: Self-pay

## 2018-12-20 ENCOUNTER — Other Ambulatory Visit
Admission: RE | Admit: 2018-12-20 | Discharge: 2018-12-20 | Disposition: A | Discharge status: Home / Self Care | Payer: Medicare Other | Source: Ambulatory Visit | Attending: Gastroenterology | Admitting: Gastroenterology

## 2018-12-20 DIAGNOSIS — Z01812 Encounter for preprocedural laboratory examination: Secondary | ICD-10-CM | POA: Diagnosis present

## 2018-12-20 DIAGNOSIS — Z1159 Encounter for screening for other viral diseases: Secondary | ICD-10-CM | POA: Insufficient documentation

## 2018-12-20 LAB — CBC WITH DIFFERENTIAL/PLATELET
Abs Immature Granulocytes: 0.01 10*3/uL (ref 0.00–0.07)
Basophils Absolute: 0 10*3/uL (ref 0.0–0.1)
Basophils Relative: 0 %
Eosinophils Absolute: 0 10*3/uL (ref 0.0–0.5)
Eosinophils Relative: 1 %
HCT: 28.4 % — ABNORMAL LOW (ref 39.0–52.0)
Hemoglobin: 9.4 g/dL — ABNORMAL LOW (ref 13.0–17.0)
Immature Granulocytes: 0 %
Lymphocytes Relative: 16 %
Lymphs Abs: 0.8 10*3/uL (ref 0.7–4.0)
MCH: 32.4 pg (ref 26.0–34.0)
MCHC: 33.1 g/dL (ref 30.0–36.0)
MCV: 97.9 fL (ref 80.0–100.0)
Monocytes Absolute: 0.4 10*3/uL (ref 0.1–1.0)
Monocytes Relative: 8 %
Neutro Abs: 3.4 10*3/uL (ref 1.7–7.7)
Neutrophils Relative %: 75 %
Platelets: 82 10*3/uL — ABNORMAL LOW (ref 150–400)
RBC: 2.9 MIL/uL — ABNORMAL LOW (ref 4.22–5.81)
RDW: 20.8 % — ABNORMAL HIGH (ref 11.5–15.5)
WBC: 4.6 10*3/uL (ref 4.0–10.5)
nRBC: 0 % (ref 0.0–0.2)

## 2018-12-20 LAB — COMPREHENSIVE METABOLIC PANEL
ALT: 41 U/L (ref 0–44)
AST: 172 U/L — ABNORMAL HIGH (ref 15–41)
Albumin: 2.3 g/dL — ABNORMAL LOW (ref 3.5–5.0)
Alkaline Phosphatase: 196 U/L — ABNORMAL HIGH (ref 38–126)
Anion gap: 7 (ref 5–15)
BUN: 11 mg/dL (ref 8–23)
CO2: 21 mmol/L — ABNORMAL LOW (ref 22–32)
Calcium: 8.2 mg/dL — ABNORMAL LOW (ref 8.9–10.3)
Chloride: 109 mmol/L (ref 98–111)
Creatinine, Ser: 0.54 mg/dL — ABNORMAL LOW (ref 0.61–1.24)
GFR calc Af Amer: >60 mL/min (ref >60)
GFR calc non Af Amer: >60 mL/min (ref >60)
Glucose, Bld: 107 mg/dL — ABNORMAL HIGH (ref 70–99)
Potassium: 3.4 mmol/L — ABNORMAL LOW (ref 3.5–5.1)
Sodium: 137 mmol/L (ref 135–145)
Total Bilirubin: 1.6 mg/dL — ABNORMAL HIGH (ref 0.3–1.2)
Total Protein: 7.6 g/dL (ref 6.5–8.1)

## 2018-12-20 LAB — PROTIME-INR
INR: 1.2 (ref 0.8–1.2)
Prothrombin Time: 14.9 seconds (ref 11.4–15.2)

## 2018-12-21 LAB — NOVEL CORONAVIRUS, NAA (HOSP ORDER, SEND-OUT TO REF LAB; TAT 18-24 HRS): SARS-CoV-2, NAA: NOT DETECTED

## 2018-12-23 ENCOUNTER — Telehealth: Payer: Self-pay | Admitting: Gastroenterology

## 2018-12-23 NOTE — Telephone Encounter (Signed)
Returned [employer] call regarding question about medications he should take tomorrow. All questions answered.

## 2018-12-23 NOTE — Telephone Encounter (Signed)
Patient is having a colonoscopy tomorrow with Dr Vicente Males & Windell Moment called to see if he can take his medicine before the procedure

## 2018-12-24 ENCOUNTER — Ambulatory Visit: Payer: Medicare Other | Admitting: Anesthesiology

## 2018-12-24 ENCOUNTER — Encounter
Admission: RE | Disposition: A | Discharge status: Home / Self Care | Payer: Self-pay | Source: Home / Self Care | Attending: Gastroenterology

## 2018-12-24 ENCOUNTER — Ambulatory Visit
Admission: RE | Admit: 2018-12-24 | Discharge: 2018-12-24 | Disposition: A | Discharge status: Home / Self Care | Payer: Medicare Other | Attending: Gastroenterology | Admitting: Gastroenterology

## 2018-12-24 ENCOUNTER — Encounter: Payer: Self-pay | Admitting: *Deleted

## 2018-12-24 ENCOUNTER — Inpatient Hospital Stay: Payer: Medicare Other | Attending: Oncology

## 2018-12-24 DIAGNOSIS — K921 Melena: Secondary | ICD-10-CM | POA: Diagnosis not present

## 2018-12-24 DIAGNOSIS — K3189 Other diseases of stomach and duodenum: Secondary | ICD-10-CM | POA: Diagnosis not present

## 2018-12-24 DIAGNOSIS — F17210 Nicotine dependence, cigarettes, uncomplicated: Secondary | ICD-10-CM | POA: Insufficient documentation

## 2018-12-24 DIAGNOSIS — K766 Portal hypertension: Secondary | ICD-10-CM | POA: Diagnosis not present

## 2018-12-24 DIAGNOSIS — J449 Chronic obstructive pulmonary disease, unspecified: Secondary | ICD-10-CM | POA: Insufficient documentation

## 2018-12-24 DIAGNOSIS — R188 Other ascites: Secondary | ICD-10-CM

## 2018-12-24 DIAGNOSIS — K7460 Unspecified cirrhosis of liver: Secondary | ICD-10-CM

## 2018-12-24 DIAGNOSIS — I8500 Esophageal varices without bleeding: Secondary | ICD-10-CM | POA: Diagnosis not present

## 2018-12-24 HISTORY — PX: ESOPHAGOGASTRODUODENOSCOPY (EGD) WITH PROPOFOL: SHX5813

## 2018-12-24 SURGERY — ESOPHAGOGASTRODUODENOSCOPY (EGD) WITH PROPOFOL
Anesthesia: General

## 2018-12-24 MED ORDER — NADOLOL 20 MG PO TABS: 20.0000 mg | ORAL_TABLET | Freq: Every day | ORAL | 11 refills | Status: DC

## 2018-12-24 MED ORDER — MIDAZOLAM HCL 2 MG/2ML IJ SOLN
INTRAMUSCULAR | Status: DC | PRN
  Administered 2018-12-24: 2 mg via INTRAVENOUS

## 2018-12-24 MED ORDER — PHENYLEPHRINE HCL (PRESSORS) 10 MG/ML IV SOLN
INTRAVENOUS | Status: DC | PRN
  Administered 2018-12-24 (×2): 100 ug via INTRAVENOUS

## 2018-12-24 MED ORDER — SODIUM CHLORIDE 0.9 % IV SOLN
INTRAVENOUS | Status: DC
  Administered 2018-12-24: 12:00:00 via INTRAVENOUS

## 2018-12-24 MED ORDER — PROPOFOL 500 MG/50ML IV EMUL
INTRAVENOUS | Status: DC | PRN
  Administered 2018-12-24: 150 ug/kg/min via INTRAVENOUS

## 2018-12-24 MED ORDER — GLYCOPYRROLATE 0.2 MG/ML IJ SOLN
INTRAMUSCULAR | Status: DC | PRN
  Administered 2018-12-24: 0.2 mg via INTRAVENOUS

## 2018-12-24 MED ORDER — MIDAZOLAM HCL 2 MG/2ML IJ SOLN
INTRAMUSCULAR | Status: AC
  Filled 2018-12-24: qty 2

## 2018-12-24 MED ORDER — LIDOCAINE HCL (CARDIAC) PF 100 MG/5ML IV SOSY
PREFILLED_SYRINGE | INTRAVENOUS | Status: DC | PRN
  Administered 2018-12-24: 50 mg via INTRAVENOUS

## 2018-12-24 NOTE — H&P (Signed)
Jonathon Bellows, MD [address] [place], [place], [place], [address] [address], [place], [place], [place], [address] Phone: [phone]  Fax: [phone]  Primary Care Physician:  [facility]   Pre-Procedure History & Physical: HPI:  Andrew Gibbs is a 63 y.o. male is here for an endoscopy    Past Medical History:  Diagnosis Date  . Blood dyscrasia    thrombocytopenia  . Hepatitis C   . Heroin use     Past Surgical History:  Procedure Laterality Date  . IR ANGIOGRAM SELECTIVE EACH ADDITIONAL VESSEL  10/17/2018  . IR ANGIOGRAM SELECTIVE EACH ADDITIONAL VESSEL  10/17/2018  . IR ANGIOGRAM SELECTIVE EACH ADDITIONAL VESSEL  10/17/2018  . IR ANGIOGRAM SELECTIVE EACH ADDITIONAL VESSEL  10/17/2018  . IR ANGIOGRAM SELECTIVE EACH ADDITIONAL VESSEL  10/17/2018  . IR ANGIOGRAM SELECTIVE EACH ADDITIONAL VESSEL  10/17/2018  . IR ANGIOGRAM SELECTIVE EACH ADDITIONAL VESSEL  10/17/2018  . IR ANGIOGRAM SELECTIVE EACH ADDITIONAL VESSEL  11/05/2018  . IR ANGIOGRAM SELECTIVE EACH ADDITIONAL VESSEL  11/05/2018  . IR ANGIOGRAM SELECTIVE EACH ADDITIONAL VESSEL  11/05/2018  . IR ANGIOGRAM VISCERAL SELECTIVE  10/17/2018  . IR ANGIOGRAM VISCERAL SELECTIVE  10/17/2018  . IR ANGIOGRAM VISCERAL SELECTIVE  11/05/2018  . IR EMBO ARTERIAL NOT HEMORR HEMANG INC GUIDE ROADMAPPING  10/17/2018  . IR EMBO TUMOR ORGAN ISCHEMIA INFARCT INC GUIDE ROADMAPPING  11/05/2018  . IR RADIOLOGIST EVAL & MGMT  05/22/2018  . IR RADIOLOGIST EVAL & MGMT  08/27/2018  . IR US GUIDE VASC ACCESS RIGHT  10/17/2018  . IR US GUIDE VASC ACCESS RIGHT  11/05/2018  . KNEE SURGERY    . TEE WITHOUT CARDIOVERSION N/A 04/22/2018   Procedure: TRANSESOPHAGEAL ECHOCARDIOGRAM (TEE);  Surgeon: Teodoro Spray, MD;  Location: [facility] ORS;  Service: Cardiovascular;  Laterality: N/A;    Prior to Admission medications   Medication Sig Start Date End Date Taking? Authorizing Provider  cyclobenzaprine (FLEXERIL) 10 MG tablet Take 1 tablet (10 mg  total) by mouth 3 (three) times daily as needed. 12/02/18  Yes Versie Starks, PA-C  LASIX 20 MG tablet  07/30/18  Yes [provider]  traMADol (ULTRAM) 50 MG tablet Take 1 tablet (50 mg total) by mouth every 6 (six) hours as needed. 12/19/18  Yes Lloyd Huger, MD  fluconazole (DIFLUCAN) 200 MG tablet 200 mg. 09/15/18   [provider]  furosemide (LASIX) 20 MG tablet Take 1 tablet (20 mg total) by mouth daily. 07/29/18 11/12/18  Jonathon Bellows, MD  Multiple Vitamin (MULTIVITAMIN WITH MINERALS) TABS tablet Take 1 tablet by mouth daily. 05/03/18   Epifanio Lesches, MD  pantoprazole (PROTONIX) 40 MG tablet Take 1 tablet (40 mg total) by mouth daily. 11/05/18   Corrie Mckusick, DO  predniSONE (STERAPRED UNI-PAK 21 TAB) 10 MG (21) TBPK tablet Take 6 pills on day one then decrease by 1 pill each day Patient not taking: Reported on 12/19/2018 12/02/18   Versie Starks, PA-C  spironolactone (ALDACTONE) 50 MG tablet Take 1 tablet (50 mg total) by mouth daily. 07/29/18 11/12/18  Jonathon Bellows, MD    Allergies as of 12/19/2018  . (No Known Allergies)    Family History  Problem Relation Age of Onset  . Multiple myeloma Mother     Social History   Socioeconomic History  . Marital status: Widowed    Spouse name: Not on file  . Number of children: Not on file  . Years of  education: Not on file  . Highest education level: Not on file  Occupational History  . Not on file  Social Needs  . Financial resource strain: Not on file  . Food insecurity:    Worry: Not on file    Inability: Not on file  . Transportation needs:    Medical: Not on file    Non-medical: Not on file  Tobacco Use  . Smoking status: Current Every Day Smoker    Packs/day: 0.50    Years: 40.00    Pack years: 20.00  . Smokeless tobacco: Never Used  Substance and Sexual Activity  . Alcohol use: Not Currently    Comment: 40 oz today  . Drug use: Not Currently    Types: IV, Cocaine, Heroin, Marijuana     Comment: heroin, states he used to use cocaine  . Sexual activity: Not on file  Lifestyle  . Physical activity:    Days per week: Not on file    Minutes per session: Not on file  . Stress: Not on file  Relationships  . Social connections:    Talks on phone: Not on file    Gets together: Not on file    Attends religious service: Not on file    Active member of club or organization: Not on file    Attends meetings of clubs or organizations: Not on file    Relationship status: Not on file  . Intimate partner violence:    Fear of current or ex partner: Not on file    Emotionally abused: Not on file    Physically abused: Not on file    Forced sexual activity: Not on file  Other Topics Concern  . Not on file  Social History Narrative  . Not on file    Review of Systems: See HPI, otherwise negative ROS  Physical Exam: BP 91/63   Pulse 75   Temp 98 F (36.7 C) (Tympanic)   Resp 18   Ht '5\' 7"'  (1.702 m)   Wt 57.1 kg   SpO2 100%   BMI 19.70 kg/m  General:   Alert,  pleasant and cooperative in NAD Head:  Normocephalic and atraumatic. Neck:  Supple; no masses or thyromegaly. Lungs:  Clear throughout to auscultation, normal respiratory effort.    Heart:  +S1, +S2, Regular rate and rhythm, No edema. Abdomen:  Soft, nontender and nondistended. Normal bowel sounds, without guarding, and without rebound.   Neurologic:  Alert and  oriented x4;  grossly normal neurologically.  Impression/Plan: Andrew Gibbs is here for an endoscopy  to be performed for  evaluation of melena    Risks, benefits, limitations, and alternatives regarding endoscopy have been reviewed with the patient.  Questions have been answered.  All parties agreeable.   Jonathon Bellows, MD  12/24/2018, 12:28 PM

## 2018-12-24 NOTE — Anesthesia Preprocedure Evaluation (Signed)
Anesthesia Evaluation  Patient identified by MRN, date of birth, ID band Patient awake    Reviewed: Allergy & Precautions, H&P , NPO status , Patient's Chart, lab work & pertinent test results  Airway Mallampati: I  TM Distance: >3 FB Neck ROM: full    Dental  (+) Chipped, Poor Dentition   Pulmonary COPD, Current Smoker,           Cardiovascular Exercise Tolerance: Good (-) angina(-) Past MI and (-) DOE negative cardio ROS       Neuro/Psych negative neurological ROS  negative psych ROS   GI/Hepatic negative GI ROS, (+) Hepatitis -, C  Endo/Other  negative endocrine ROS  Renal/GU negative Renal ROS  negative genitourinary   Musculoskeletal   Abdominal   Peds  Hematology  (+) Blood dyscrasia, ,   Anesthesia Other Findings Past Medical History: No date: Blood dyscrasia     Comment:  thrombocytopenia No date: Hepatitis C No date: Heroin use  Past Surgical History: 10/17/2018: IR ANGIOGRAM SELECTIVE EACH ADDITIONAL VESSEL 10/17/2018: IR ANGIOGRAM SELECTIVE EACH ADDITIONAL VESSEL 10/17/2018: IR ANGIOGRAM SELECTIVE EACH ADDITIONAL VESSEL 10/17/2018: IR ANGIOGRAM SELECTIVE EACH ADDITIONAL VESSEL 10/17/2018: IR ANGIOGRAM SELECTIVE EACH ADDITIONAL VESSEL 10/17/2018: IR ANGIOGRAM SELECTIVE EACH ADDITIONAL VESSEL 10/17/2018: IR ANGIOGRAM SELECTIVE EACH ADDITIONAL VESSEL 11/05/2018: IR ANGIOGRAM SELECTIVE EACH ADDITIONAL VESSEL 11/05/2018: IR ANGIOGRAM SELECTIVE EACH ADDITIONAL VESSEL 11/05/2018: IR ANGIOGRAM SELECTIVE EACH ADDITIONAL VESSEL 10/17/2018: IR ANGIOGRAM VISCERAL SELECTIVE 10/17/2018: IR ANGIOGRAM VISCERAL SELECTIVE 11/05/2018: IR ANGIOGRAM VISCERAL SELECTIVE 10/17/2018: IR EMBO ARTERIAL NOT HEMORR HEMANG INC GUIDE ROADMAPPING 11/05/2018: IR EMBO TUMOR ORGAN ISCHEMIA INFARCT INC GUIDE ROADMAPPING 05/22/2018: IR RADIOLOGIST EVAL & MGMT 08/27/2018: IR RADIOLOGIST EVAL & MGMT 10/17/2018: IR US GUIDE VASC ACCESS RIGHT 11/05/2018: IR US  GUIDE VASC ACCESS RIGHT No date: KNEE SURGERY 04/22/2018: TEE WITHOUT CARDIOVERSION; N/A     Comment:  Procedure: TRANSESOPHAGEAL ECHOCARDIOGRAM (TEE);                Surgeon: Teodoro Spray, MD;  Location: [facility] ORS;                Service: Cardiovascular;  Laterality: N/A;  BMI    Body Mass Index:  19.70 kg/m      Reproductive/Obstetrics negative OB ROS                             Anesthesia Physical Anesthesia Plan  ASA: III  Anesthesia Plan: General   Post-op Pain Management:    Induction: Intravenous  PONV Risk Score and Plan: Propofol infusion and TIVA  Airway Management Planned: Natural Airway and Nasal Cannula  Additional Equipment:   Intra-op Plan:   Post-operative Plan:   Informed Consent: I have reviewed the patients History and Physical, chart, labs and discussed the procedure including the risks, benefits and alternatives for the proposed anesthesia with the patient or authorized representative who has indicated his/her understanding and acceptance.     Dental Advisory Given  Plan Discussed with: Anesthesiologist, CRNA and Surgeon  Anesthesia Plan Comments: (Patient consented for risks of anesthesia including but not limited to:  - adverse reactions to medications - risk of intubation if required - damage to teeth, lips or other oral mucosa - sore throat or hoarseness - Damage to heart, brain, lungs or loss of life  Patient voiced understanding.)        Anesthesia Quick Evaluation

## 2018-12-24 NOTE — Transfer of Care (Signed)
Immediate Anesthesia Transfer of Care Note  Patient: Andrew Gibbs  Procedure(s) Performed: ESOPHAGOGASTRODUODENOSCOPY (EGD) WITH PROPOFOL (N/A )  Patient Location: PACU  Anesthesia Type:General  Level of Consciousness: sedated  Airway & Oxygen Therapy: Patient Spontanous Breathing  Post-op Assessment: Report given to RN  Post vital signs: stable  Last Vitals:  Vitals Value Taken Time  BP 92/55 12/24/2018  1:28 PM  Temp 36.7 C 12/24/2018  1:28 PM  Pulse 71 12/24/2018  1:28 PM  Resp 15 12/24/2018  1:28 PM  SpO2 98 % 12/24/2018  1:28 PM    Last Pain:  Vitals:   12/24/18 1328  TempSrc: Tympanic  PainSc: Asleep         Complications: No apparent anesthesia complications

## 2018-12-24 NOTE — Op Note (Signed)
[facility] Patient Name: Andrew Gibbs Procedure Date: 12/24/2018 1:06 PM MRN: [id number] Account #: [id number] Date of Birth: 1955-11-22 Admit Type: Outpatient Age: 63 Room: [facility] ENDO ROOM 3 Gender: Male Note Status: Finalized Procedure:            Upper GI endoscopy Indications:          Melena Providers:            Jonathon Bellows MD, MD Medicines:            Monitored Anesthesia Care Complications:        No immediate complications. Procedure:            Pre-Anesthesia Assessment:                       - Prior to the procedure, a History and Physical was                        performed, and patient medications, allergies and                        sensitivities were reviewed. The patient's tolerance of                        previous anesthesia was reviewed.                       - The risks and benefits of the procedure and the                        sedation options and risks were discussed with the                        patient. All questions were answered and informed                        consent was obtained.                       - ASA Grade Assessment: III - A patient with severe                        systemic disease.                       After obtaining informed consent, the endoscope was                        passed under direct vision. Throughout the procedure,                        the patient's blood pressure, pulse, and oxygen                        saturations were monitored continuously. The Endoscope                        was introduced through the mouth, and advanced to the                        third part of duodenum. The upper GI endoscopy was  accomplished with ease. The patient tolerated the                        procedure well. Findings:      The examined duodenum was normal.      Portal hypertensive gastropathy was found in the entire examined       stomach. This was biopsied with a cold  forceps for histology.      Grade I varices were found in the lower third of the esophagus. Impression:           - Normal examined duodenum.                       - Portal hypertensive gastropathy. Biopsied.                       - Grade I esophageal varices. Recommendation:       - Discharge patient to home (with escort).                       - Resume previous diet.                       - Continue present medications.                       - Give a beta blocker with dosage titrated by the heart                        rate.                       - Return to my office in 2 weeks.                       - Repeat upper endoscopy in 6 months for surveillance. Procedure Code(s):    --- Professional ---                       [phone], Esophagogastroduodenoscopy, flexible, transoral;                        with biopsy, single or multiple Diagnosis Code(s):    --- Professional ---                       K76.6, Portal hypertension                       K31.89, Other diseases of stomach and duodenum                       I85.00, Esophageal varices without bleeding                       K92.1, Melena (includes Hematochezia) CPT copyright 2019 American Medical Association. All rights reserved. The codes documented in this report are preliminary and upon coder review may  be revised to meet current compliance requirements. Jonathon Bellows, MD Jonathon Bellows MD, MD 12/24/2018 1:24:30 PM This report has been signed electronically. Number of Addenda: 0 Note Initiated On: 12/24/2018 1:06 PM Estimated Blood Loss: Estimated blood loss: none.      [facility]

## 2018-12-24 NOTE — Anesthesia Post-op Follow-up Note (Signed)
Anesthesia [facility] form completed.        

## 2018-12-25 ENCOUNTER — Encounter: Payer: Self-pay | Admitting: Gastroenterology

## 2018-12-25 NOTE — Anesthesia Postprocedure Evaluation (Signed)
Anesthesia Post Note  Patient: Andrew Gibbs  Procedure(s) Performed: ESOPHAGOGASTRODUODENOSCOPY (EGD) WITH PROPOFOL (N/A )  Patient location during evaluation: Endoscopy Anesthesia Type: General Level of consciousness: awake and alert Pain management: pain level controlled Vital Signs Assessment: post-procedure vital signs reviewed and stable Respiratory status: spontaneous breathing, nonlabored ventilation, respiratory function stable and patient connected to nasal cannula oxygen Cardiovascular status: blood pressure returned to baseline and stable Postop Assessment: no apparent nausea or vomiting Anesthetic complications: no     Last Vitals:  Vitals:   12/24/18 1348 12/24/18 1358  BP: (!) 90/56 (!) 99/58  Pulse: 67 68  Resp: 13 14  Temp:    SpO2: 98% 100%    Last Pain:  Vitals:   12/24/18 1358  TempSrc:   PainSc: 0-No pain                 Precious Haws Haru Anspaugh

## 2018-12-26 LAB — SURGICAL PATHOLOGY

## 2018-12-30 ENCOUNTER — Telehealth: Payer: Self-pay | Admitting: Gastroenterology

## 2018-12-30 NOTE — Telephone Encounter (Signed)
Jenette pt's sister called & wanted to make sure he did not need a 2wk f/u after his procedure on 12-24-2018. He is schedule for a 37mon f/u 03-25-2019.

## 2019-01-03 ENCOUNTER — Encounter: Payer: Self-pay | Admitting: Emergency Medicine

## 2019-01-03 ENCOUNTER — Emergency Department: Payer: Medicare Other

## 2019-01-03 ENCOUNTER — Other Ambulatory Visit: Payer: Self-pay

## 2019-01-03 ENCOUNTER — Inpatient Hospital Stay
Admission: EM | Admit: 2019-01-03 | Discharge: 2019-01-13 | DRG: 432 | Disposition: A | Discharge status: Home Health | Payer: Medicare Other | Attending: Internal Medicine | Admitting: Internal Medicine

## 2019-01-03 DIAGNOSIS — Z1159 Encounter for screening for other viral diseases: Secondary | ICD-10-CM | POA: Diagnosis not present

## 2019-01-03 DIAGNOSIS — M549 Dorsalgia, unspecified: Secondary | ICD-10-CM

## 2019-01-03 DIAGNOSIS — E875 Hyperkalemia: Secondary | ICD-10-CM | POA: Diagnosis present

## 2019-01-03 DIAGNOSIS — Z79899 Other long term (current) drug therapy: Secondary | ICD-10-CM | POA: Diagnosis not present

## 2019-01-03 DIAGNOSIS — M545 Low back pain: Secondary | ICD-10-CM | POA: Diagnosis present

## 2019-01-03 DIAGNOSIS — K920 Hematemesis: Secondary | ICD-10-CM | POA: Diagnosis present

## 2019-01-03 DIAGNOSIS — K7040 Alcoholic hepatic failure without coma: Secondary | ICD-10-CM | POA: Diagnosis not present

## 2019-01-03 DIAGNOSIS — F1010 Alcohol abuse, uncomplicated: Secondary | ICD-10-CM | POA: Diagnosis present

## 2019-01-03 DIAGNOSIS — K3189 Other diseases of stomach and duodenum: Secondary | ICD-10-CM | POA: Diagnosis present

## 2019-01-03 DIAGNOSIS — Z515 Encounter for palliative care: Secondary | ICD-10-CM

## 2019-01-03 DIAGNOSIS — G893 Neoplasm related pain (acute) (chronic): Secondary | ICD-10-CM | POA: Diagnosis present

## 2019-01-03 DIAGNOSIS — E876 Hypokalemia: Secondary | ICD-10-CM | POA: Diagnosis not present

## 2019-01-03 DIAGNOSIS — C220 Liver cell carcinoma: Secondary | ICD-10-CM | POA: Diagnosis present

## 2019-01-03 DIAGNOSIS — F17210 Nicotine dependence, cigarettes, uncomplicated: Secondary | ICD-10-CM | POA: Diagnosis present

## 2019-01-03 DIAGNOSIS — C7951 Secondary malignant neoplasm of bone: Secondary | ICD-10-CM | POA: Diagnosis present

## 2019-01-03 DIAGNOSIS — B1920 Unspecified viral hepatitis C without hepatic coma: Secondary | ICD-10-CM | POA: Diagnosis present

## 2019-01-03 DIAGNOSIS — I8511 Secondary esophageal varices with bleeding: Secondary | ICD-10-CM | POA: Diagnosis present

## 2019-01-03 DIAGNOSIS — K7030 Alcoholic cirrhosis of liver without ascites: Secondary | ICD-10-CM | POA: Diagnosis present

## 2019-01-03 DIAGNOSIS — E8770 Fluid overload, unspecified: Secondary | ICD-10-CM | POA: Diagnosis not present

## 2019-01-03 DIAGNOSIS — R069 Unspecified abnormalities of breathing: Secondary | ICD-10-CM

## 2019-01-03 DIAGNOSIS — K766 Portal hypertension: Secondary | ICD-10-CM | POA: Diagnosis present

## 2019-01-03 DIAGNOSIS — K922 Gastrointestinal hemorrhage, unspecified: Secondary | ICD-10-CM | POA: Diagnosis present

## 2019-01-03 LAB — COMPREHENSIVE METABOLIC PANEL
ALT: 97 U/L — ABNORMAL HIGH (ref 0–44)
AST: 431 U/L — ABNORMAL HIGH (ref 15–41)
Albumin: 2 g/dL — ABNORMAL LOW (ref 3.5–5.0)
Alkaline Phosphatase: 194 U/L — ABNORMAL HIGH (ref 38–126)
Anion gap: 11 (ref 5–15)
BUN: 20 mg/dL (ref 8–23)
CO2: 18 mmol/L — ABNORMAL LOW (ref 22–32)
Calcium: 8.3 mg/dL — ABNORMAL LOW (ref 8.9–10.3)
Chloride: 108 mmol/L (ref 98–111)
Creatinine, Ser: 0.52 mg/dL — ABNORMAL LOW (ref 0.61–1.24)
GFR calc Af Amer: >60 mL/min (ref >60)
GFR calc non Af Amer: >60 mL/min (ref >60)
Glucose, Bld: 101 mg/dL — ABNORMAL HIGH (ref 70–99)
Potassium: 5.4 mmol/L — ABNORMAL HIGH (ref 3.5–5.1)
Sodium: 137 mmol/L (ref 135–145)
Total Bilirubin: 2.8 mg/dL — ABNORMAL HIGH (ref 0.3–1.2)
Total Protein: 7 g/dL (ref 6.5–8.1)

## 2019-01-03 LAB — CBC WITH DIFFERENTIAL/PLATELET
Abs Immature Granulocytes: 0.17 10*3/uL — ABNORMAL HIGH (ref 0.00–0.07)
Basophils Absolute: 0 10*3/uL (ref 0.0–0.1)
Basophils Relative: 0 %
Eosinophils Absolute: 0.1 10*3/uL (ref 0.0–0.5)
Eosinophils Relative: 0 %
HCT: 30.1 % — ABNORMAL LOW (ref 39.0–52.0)
Hemoglobin: 9.8 g/dL — ABNORMAL LOW (ref 13.0–17.0)
Immature Granulocytes: 1 %
Lymphocytes Relative: 22 %
Lymphs Abs: 2.6 10*3/uL (ref 0.7–4.0)
MCH: 31.7 pg (ref 26.0–34.0)
MCHC: 32.6 g/dL (ref 30.0–36.0)
MCV: 97.4 fL (ref 80.0–100.0)
Monocytes Absolute: 0.6 10*3/uL (ref 0.1–1.0)
Monocytes Relative: 5 %
Neutro Abs: 8.4 10*3/uL — ABNORMAL HIGH (ref 1.7–7.7)
Neutrophils Relative %: 72 %
Platelets: 171 10*3/uL (ref 150–400)
RBC: 3.09 MIL/uL — ABNORMAL LOW (ref 4.22–5.81)
RDW: 21.1 % — ABNORMAL HIGH (ref 11.5–15.5)
WBC: 11.8 10*3/uL — ABNORMAL HIGH (ref 4.0–10.5)
nRBC: 0 % (ref 0.0–0.2)

## 2019-01-03 MED ORDER — ONDANSETRON HCL 4 MG/2ML IJ SOLN: 4.0000 mg | Freq: Four times a day (QID) | INTRAMUSCULAR | Status: DC | PRN

## 2019-01-03 MED ORDER — ADULT MULTIVITAMIN W/MINERALS CH: 1.0000 | ORAL_TABLET | Freq: Every day | ORAL | Status: DC

## 2019-01-03 MED ORDER — SODIUM CHLORIDE 0.9% FLUSH
3.0000 mL | Freq: Two times a day (BID) | INTRAVENOUS | Status: DC
  Administered 2019-01-04 – 2019-01-13 (×15): 3 mL via INTRAVENOUS

## 2019-01-03 MED ORDER — SODIUM CHLORIDE 0.9 % IV SOLN
50.0000 ug/h | INTRAVENOUS | Status: DC
  Administered 2019-01-03: 50 ug/h via INTRAVENOUS
  Filled 2019-01-03 (×2): qty 1

## 2019-01-03 MED ORDER — THIAMINE HCL 100 MG/ML IJ SOLN: 100.0000 mg | Freq: Every day | INTRAMUSCULAR | Status: DC

## 2019-01-03 MED ORDER — LACTULOSE 10 GM/15ML PO SOLN
30.0000 g | Freq: Once | ORAL | Status: AC
  Administered 2019-01-03: 30 g via ORAL
  Filled 2019-01-03: qty 60

## 2019-01-03 MED ORDER — SODIUM CHLORIDE 0.9 % IV SOLN
INTRAVENOUS | Status: DC
  Administered 2019-01-04: 01:00:00 via INTRAVENOUS

## 2019-01-03 MED ORDER — ORPHENADRINE CITRATE 30 MG/ML IJ SOLN
60.0000 mg | Freq: Two times a day (BID) | INTRAMUSCULAR | Status: DC
  Administered 2019-01-04 – 2019-01-13 (×19): 60 mg via INTRAMUSCULAR
  Filled 2019-01-03 (×21): qty 2

## 2019-01-03 MED ORDER — SODIUM CHLORIDE 0.9 % IV SOLN
INTRAVENOUS | Status: DC
  Administered 2019-01-04 – 2019-01-08 (×9): via INTRAVENOUS

## 2019-01-03 MED ORDER — SODIUM CHLORIDE 0.9 % IV SOLN
50.0000 ug/h | INTRAVENOUS | Status: DC
  Administered 2019-01-04 – 2019-01-06 (×5): 50 ug/h via INTRAVENOUS
  Filled 2019-01-03 (×9): qty 1

## 2019-01-03 MED ORDER — IOPAMIDOL (ISOVUE-370) INJECTION 76%
100.0000 mL | Freq: Once | INTRAVENOUS | Status: AC | PRN
  Administered 2019-01-03: 100 mL via INTRAVENOUS

## 2019-01-03 MED ORDER — PANTOPRAZOLE SODIUM 40 MG IV SOLR
40.0000 mg | Freq: Once | INTRAVENOUS | Status: AC
  Administered 2019-01-04: 40 mg via INTRAVENOUS
  Filled 2019-01-03: qty 40

## 2019-01-03 MED ORDER — ONDANSETRON HCL 4 MG PO TABS: 4.0000 mg | ORAL_TABLET | Freq: Four times a day (QID) | ORAL | Status: DC | PRN

## 2019-01-03 MED ORDER — FOLIC ACID 1 MG PO TABS
1.0000 mg | ORAL_TABLET | Freq: Every day | ORAL | Status: DC
  Administered 2019-01-06 – 2019-01-13 (×5): 1 mg via ORAL
  Filled 2019-01-03 (×5): qty 1

## 2019-01-03 MED ORDER — SODIUM CHLORIDE 0.9 % IV BOLUS
1000.0000 mL | Freq: Once | INTRAVENOUS | Status: AC
  Administered 2019-01-03: 1000 mL via INTRAVENOUS

## 2019-01-03 MED ORDER — ADULT MULTIVITAMIN W/MINERALS CH
1.0000 | ORAL_TABLET | Freq: Every day | ORAL | Status: DC
  Administered 2019-01-06 – 2019-01-13 (×5): 1 via ORAL
  Filled 2019-01-03 (×5): qty 1

## 2019-01-03 MED ORDER — PANTOPRAZOLE SODIUM 40 MG IV SOLR
40.0000 mg | Freq: Once | INTRAVENOUS | Status: AC
  Administered 2019-01-03: 40 mg via INTRAVENOUS
  Filled 2019-01-03: qty 40

## 2019-01-03 MED ORDER — THIAMINE HCL 100 MG/ML IJ SOLN
100.0000 mg | Freq: Every day | INTRAMUSCULAR | Status: DC
  Administered 2019-01-05 – 2019-01-10 (×5): 100 mg via INTRAVENOUS
  Filled 2019-01-03 (×6): qty 2

## 2019-01-03 MED ORDER — PANTOPRAZOLE SODIUM 40 MG PO TBEC: 40.0000 mg | DELAYED_RELEASE_TABLET | Freq: Every day | ORAL | Status: DC

## 2019-01-03 MED ORDER — VITAMIN B-1 100 MG PO TABS
100.0000 mg | ORAL_TABLET | Freq: Every day | ORAL | Status: DC
  Administered 2019-01-07 – 2019-01-13 (×4): 100 mg via ORAL
  Filled 2019-01-03 (×5): qty 1

## 2019-01-03 MED ORDER — LORAZEPAM 2 MG/ML IJ SOLN
1.0000 mg | Freq: Four times a day (QID) | INTRAMUSCULAR | Status: DC | PRN
  Administered 2019-01-04: 1 mg via INTRAVENOUS
  Filled 2019-01-03: qty 1

## 2019-01-03 MED ORDER — FOLIC ACID 5 MG/ML IJ SOLN: 1.0000 mg | Freq: Every day | INTRAMUSCULAR | Status: DC

## 2019-01-03 MED ORDER — LORAZEPAM 1 MG PO TABS: 1.0000 mg | ORAL_TABLET | Freq: Four times a day (QID) | ORAL | Status: DC | PRN

## 2019-01-03 MED ORDER — OCTREOTIDE LOAD VIA INFUSION
50.0000 ug | Freq: Once | INTRAVENOUS | Status: AC
  Administered 2019-01-03: 50 ug via INTRAVENOUS
  Filled 2019-01-03: qty 25

## 2019-01-03 MED ORDER — FAMOTIDINE IN NACL 20-0.9 MG/50ML-% IV SOLN: 20.0000 mg | Freq: Once | INTRAVENOUS | Status: DC

## 2019-01-03 NOTE — ED Triage Notes (Signed)
Pt presents to ED via [facility] from home c/o 10/10 lower back pain x2 months. 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

## 2019-01-03 NOTE — ED Notes (Signed)
Lab called to obtain type and screen d/t pt being hard stick

## 2019-01-03 NOTE — ED Notes (Signed)
ED TO INPATIENT HANDOFF REPORT  ED Nurse Name and Phone #: Dayten Juba [phone]   S Name/Age/Gender Andrew Gibbs 63 y.o. male Room/Bed: ED11A/ED11A  Code Status   Code Status: Full Code  Home/SNF/Other Home Patient oriented to: self, place, time and situation Is this baseline? Yes   Triage Complete: Triage complete  Chief Complaint back pain ems  Triage Note Pt presents to ED via [facility] from home c/o 10/10 lower back pain x2 months. 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    Allergies No Known Allergies  Level of Care/Admitting Diagnosis ED Disposition    ED Disposition Condition [facility] Area: [place] [[id number]]  Level of Care: Med-Surg [16]  Covid Evaluation: Screening Protocol (No Symptoms)  Diagnosis: GI bleed [568127]  Admitting Physician: Mayer Camel [[id number]]  Attending Physician: Mayer Camel [[id number]]  Estimated length of stay: past midnight tomorrow  Certification:: I certify this patient will need inpatient services for at least 2 midnights  PT Class (Do Not Modify): Inpatient [101]  PT Acc Code (Do Not Modify): Private [1]       B Medical/Surgery History Past Medical History:  Diagnosis Date  . Blood dyscrasia    thrombocytopenia  . Hepatitis C   . Heroin use    Past Surgical History:  Procedure Laterality Date  . ESOPHAGOGASTRODUODENOSCOPY (EGD) WITH PROPOFOL N/A 12/24/2018   Procedure: ESOPHAGOGASTRODUODENOSCOPY (EGD) WITH PROPOFOL;  Surgeon: Jonathon Bellows, MD;  Location: [facility] ENDOSCOPY;  Service: Gastroenterology;  Laterality: N/A;  . IR ANGIOGRAM SELECTIVE EACH ADDITIONAL VESSEL  10/17/2018  . IR ANGIOGRAM SELECTIVE EACH ADDITIONAL VESSEL  10/17/2018  . IR ANGIOGRAM SELECTIVE EACH ADDITIONAL VESSEL  10/17/2018  . IR ANGIOGRAM SELECTIVE EACH ADDITIONAL VESSEL  10/17/2018  . IR ANGIOGRAM SELECTIVE EACH ADDITIONAL VESSEL  10/17/2018  . IR ANGIOGRAM SELECTIVE EACH  ADDITIONAL VESSEL  10/17/2018  . IR ANGIOGRAM SELECTIVE EACH ADDITIONAL VESSEL  10/17/2018  . IR ANGIOGRAM SELECTIVE EACH ADDITIONAL VESSEL  11/05/2018  . IR ANGIOGRAM SELECTIVE EACH ADDITIONAL VESSEL  11/05/2018  . IR ANGIOGRAM SELECTIVE EACH ADDITIONAL VESSEL  11/05/2018  . IR ANGIOGRAM VISCERAL SELECTIVE  10/17/2018  . IR ANGIOGRAM VISCERAL SELECTIVE  10/17/2018  . IR ANGIOGRAM VISCERAL SELECTIVE  11/05/2018  . IR EMBO ARTERIAL NOT HEMORR HEMANG INC GUIDE ROADMAPPING  10/17/2018  . IR EMBO TUMOR ORGAN ISCHEMIA INFARCT INC GUIDE ROADMAPPING  11/05/2018  . IR RADIOLOGIST EVAL & MGMT  05/22/2018  . IR RADIOLOGIST EVAL & MGMT  08/27/2018  . IR US GUIDE VASC ACCESS RIGHT  10/17/2018  . IR US GUIDE VASC ACCESS RIGHT  11/05/2018  . KNEE SURGERY    . TEE WITHOUT CARDIOVERSION N/A 04/22/2018   Procedure: TRANSESOPHAGEAL ECHOCARDIOGRAM (TEE);  Surgeon: Teodoro Spray, MD;  Location: [facility] ORS;  Service: Cardiovascular;  Laterality: N/A;     A IV Location/Drains/Wounds Patient Lines/Drains/Airways Status   Active Line/Drains/Airways    Name:   Placement date:   Placement time:   Site:   Days:   Peripheral IV 01/03/19 Right;Lateral Antecubital   01/03/19    1909    Antecubital   less than 1   Peripheral IV 01/03/19 Right;Posterior Forearm   01/03/19    2029    Forearm   less than 1          Intake/Output Last 24 hours  Intake/Output Summary (Last 24 hours) at 01/03/2019 2332 Last data filed  at 01/03/2019 2019 Gross per 24 hour  Intake 1000 ml  Output -  Net 1000 ml    Labs/Imaging Results for orders placed or performed during the hospital encounter of 01/03/19 (from the past 48 hour(s))  Comprehensive metabolic panel     Status: Abnormal   Collection Time: 01/03/19  3:03 PM  Result Value Ref Range   Sodium 137 135 - 145 mmol/L   Potassium 5.4 (H) 3.5 - 5.1 mmol/L   Chloride 108 98 - 111 mmol/L   CO2 18 (L) 22 - 32 mmol/L   Glucose, Bld 101 (H) 70 - 99 mg/dL   BUN 20 8 - 23 mg/dL   Creatinine, Ser  0.52 (L) 0.61 - 1.24 mg/dL   Calcium 8.3 (L) 8.9 - 10.3 mg/dL   Total Protein 7.0 6.5 - 8.1 g/dL   Albumin 2.0 (L) 3.5 - 5.0 g/dL   AST 431 (H) 15 - 41 U/L   ALT 97 (H) 0 - 44 U/L   Alkaline Phosphatase 194 (H) 38 - 126 U/L   Total Bilirubin 2.8 (H) 0.3 - 1.2 mg/dL   GFR calc non Af Amer >60 >60 mL/min   GFR calc Af Amer >60 >60 mL/min   Anion gap 11 5 - 15    Comment: Performed at [facility], [address] [place], [place] [address]  CBC with Differential     Status: Abnormal   Collection Time: 01/03/19  3:03 PM  Result Value Ref Range   WBC 11.8 (H) 4.0 - 10.5 K/uL   RBC 3.09 (L) 4.22 - 5.81 MIL/uL   Hemoglobin 9.8 (L) 13.0 - 17.0 g/dL   HCT 30.1 (L) 39.0 - 52.0 %   MCV 97.4 80.0 - 100.0 fL   MCH 31.7 26.0 - 34.0 pg   MCHC 32.6 30.0 - 36.0 g/dL   RDW 21.1 (H) 11.5 - 15.5 %   Platelets 171 150 - 400 K/uL   nRBC 0.0 0.0 - 0.2 %   Neutrophils Relative % 72 %   Neutro Abs 8.4 (H) 1.7 - 7.7 K/uL   Lymphocytes Relative 22 %   Lymphs Abs 2.6 0.7 - 4.0 K/uL   Monocytes Relative 5 %   Monocytes Absolute 0.6 0.1 - 1.0 K/uL   Eosinophils Relative 0 %   Eosinophils Absolute 0.1 0.0 - 0.5 K/uL   Basophils Relative 0 %   Basophils Absolute 0.0 0.0 - 0.1 K/uL   Immature Granulocytes 1 %   Abs Immature Granulocytes 0.17 (H) 0.00 - 0.07 K/uL    Comment: Performed at [facility], [address] [place], [place] [address]   Dg Chest 2 View  Result Date: 01/03/2019 CLINICAL DATA:  Low back pain for 2 months. History of hepatitis C, IV drug use, liver cancer. EXAM: CHEST - 2 VIEW COMPARISON:  Chest x-rays dated 04/17/2018 and 02/13/2018. FINDINGS: The heart size and mediastinal contours are within normal limits. Both lungs are clear. The visualized skeletal structures are unremarkable. IMPRESSION: No active cardiopulmonary disease. Electronically Signed   By: Franki Cabot M.D.   On: 01/03/2019 17:41   Ct Angio Chest/abd/pel For Dissection W And/or W/wo  Result  Date: 01/03/2019 CLINICAL DATA:  Low back and generalized abdominal pain for 2 months. EXAM: CT ANGIOGRAPHY CHEST, ABDOMEN AND PELVIS TECHNIQUE: Multidetector CT imaging through the chest, abdomen and pelvis was performed using the standard protocol during bolus administration of intravenous contrast. Multiplanar reconstructed images and MIPs were obtained and reviewed to evaluate the  vascular anatomy. CONTRAST:  100 mL ISOVUE-370 IOPAMIDOL (ISOVUE-370) INJECTION 76% COMPARISON:  CT chest 10/16/2018. CT angiogram abdomen and pelvis 08/27/2018. FINDINGS: CTA CHEST FINDINGS Cardiovascular: Preferential opacification of the thoracic aorta. No evidence of thoracic aortic aneurysm or dissection. Normal heart size. No pericardial effusion. Mediastinum/Nodes: No enlarged mediastinal, hilar, or axillary lymph nodes. Thyroid gland, trachea, and esophagus demonstrate no significant findings. Lungs/Pleura: No pleural effusion. Lungs demonstrate mild dependent atelectasis. There is some emphysematous change in the apices. No nodule, mass or consolidative process. Musculoskeletal: Lytic lesions are seen and T2 and T3. Mild superior endplate compression fracture of T2 noted. Mixed lytic and sclerotic lesion is also present at T8. Paraspinous and epidural soft tissue tumor on the right are seen extending out of the T8 vertebral body. Tumor contacts the cord and extends into the right T7-8 foramen. There is a lytic lesion in the left T4 pedicle and medial margin of the left fourth rib. Review of the MIP images confirms the above findings. CTA ABDOMEN AND PELVIS FINDINGS VASCULAR Aorta: Normal caliber aorta without aneurysm, dissection, vasculitis or significant stenosis. Celiac: Patent without evidence of aneurysm, dissection, vasculitis or significant stenosis. SMA: Patent without evidence of aneurysm, dissection, vasculitis or significant stenosis. Renals: Both renal arteries are patent without evidence of aneurysm, dissection,  vasculitis, fibromuscular dysplasia or significant stenosis. Small accessory renal arteries on the right and left noted. IMA: Patent without evidence of aneurysm, dissection, vasculitis or significant stenosis. Inflow: Patent without evidence of aneurysm, dissection, vasculitis or significant stenosis. Veins: No obvious venous abnormality within the limitations of this arterial phase study. Review of the MIP images confirms the above findings. NON-VASCULAR Hepatobiliary: The liver is markedly cirrhotic. Round lesion in the liver measuring 4.0 x 4.5 cm in the right hepatic lobe consistent with the patient's history of hepatocellular carcinoma is noted. 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 Biliary tree is unremarkable. Vascular coils in the porta hepatis are identified. Pancreas: Unremarkable. No pancreatic ductal dilatation or surrounding inflammatory changes. Spleen: No focal lesion.  Splenomegaly. Adrenals/Urinary Tract: Renal cysts are noted. The kidneys otherwise appear normal. Ureters and urinary bladder are unremarkable. Adrenal glands appear normal. Stomach/Bowel: Stomach is within normal limits. Appendix appears normal. No evidence of bowel wall thickening, distention, or inflammatory changes. Lymphatic: No lymphadenopathy. Reproductive: Prostate is unremarkable. Other: No free or focal fluid collection. Musculoskeletal: There is a large lytic lesion and soft tissue mass in the posterior aspect of the right L1 vertebral body and pedicle. Tumor extends into the right L1-2 neural foramen and extends into the right epidural space. Second destructive lesion is seen on the right at L3 where epidural tumor extends into the thecal sac and right L3-4 foramen. Smaller lytic lesions in L4, L5 and pelvis are noted. Review of the MIP images confirms the above findings. IMPRESSION: Negative for aortic dissection or aneurysm. No acute abnormality chest, abdomen or pelvis. Markedly cirrhotic liver with a mass consistent  with the patient's known a hepatocellular carcinoma. 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 These findings could be better evaluated with MRI with and without contrast. Electronically Signed   By: Inge Rise M.D.   On: 01/03/2019 21:28    Pending Labs Unresulted Labs (From admission, onward)    Start     Ordered   01/04/19 2563  Basic metabolic panel  Tomorrow morning,  STAT     01/03/19 2322   01/04/19 0500  CBC  Tomorrow morning,   STAT     01/03/19 2322   01/04/19 0500  Protime-INR  Tomorrow morning,   STAT     01/03/19 2322   01/03/19 2323  Occult blood card to lab, stool RN will collect  Once,   STAT    Question:  Specimen to be collected by?  Answer:  RN will collect   01/03/19 2322   01/03/19 2323  Hemoglobin and hematocrit, blood  Now then every 8 hours,   STAT     01/03/19 2322   01/03/19 2323  [id number] 11+Oxyco+Alc+Crt-Bund  Once,   STAT     01/03/19 2322   01/03/19 2100  Type and screen  Once,   STAT     01/03/19 2100   01/03/19 1856  SARS Coronavirus 2 (CEPHEID - Performed in [place] hospital lab), Hosp Order  (Asymptomatic Patients Labs)  Once,   STAT    Question:  Rule Out  Answer:  Yes   01/03/19 1855   01/03/19 1555  Protime-INR  Once,   R     01/03/19 1555   01/03/19 1531  Urinalysis, Complete w Microscopic  ONCE - STAT,   STAT     01/03/19 1530          Vitals/Pain Today's Vitals   01/03/19 2143 01/03/19 2200 01/03/19 2300 01/03/19 2330  BP: 127/70 129/62    Pulse: 83 83 94 83  Resp: 19 (!) 27    Temp:      TempSrc:      SpO2: 98% 97% 96% 99%  Weight:      Height:      PainSc:        Isolation Precautions No active isolations  Medications Medications  orphenadrine (NORFLEX) injection 60 mg (0 mg Intramuscular Hold 01/03/19 1836)  pantoprazole (PROTONIX) EC tablet 40 mg (has no  administration in time range)  0.9 %  sodium chloride infusion (has no administration in time range)  octreotide (SANDOSTATIN) 500 mcg in sodium chloride 0.9 % 250 mL (2 mcg/mL) infusion (has no administration in time range)  pantoprazole (PROTONIX) injection 40 mg (has no administration in time range)  sodium chloride flush (NS) 0.9 % injection 3 mL (has no administration in time range)  0.9 %  sodium chloride infusion (has no administration in time range)  ondansetron (ZOFRAN) tablet 4 mg (has no administration in time range)    Or  ondansetron (ZOFRAN) injection 4 mg (has no administration in time range)  LORazepam (ATIVAN) tablet 1 mg (has no administration in time range)    Or  LORazepam (ATIVAN) injection 1 mg (has no administration in time range)  thiamine (VITAMIN B-1) tablet 100 mg (has no administration in time range)    Or  thiamine (B-1) injection 100 mg (has no administration in time range)  folic acid (FOLVITE) tablet 1 mg (has no administration in time range)  multivitamin with minerals tablet 1 tablet (has no administration in time range)  lactulose (CHRONULAC) 10 GM/15ML solution 30 g (30 g Oral Given 01/03/19 1704)  pantoprazole (PROTONIX) injection 40 mg (40 mg Intravenous Given 01/03/19 1950)  sodium chloride 0.9 % bolus 1,000 mL (0 mLs Intravenous Stopped 01/03/19 2019)  octreotide (SANDOSTATIN) 2 mcg/mL load via infusion 50 mcg (50 mcg Intravenous Bolus from Bag 01/03/19 2103)  iopamidol (ISOVUE-370) 76 % injection 100 mL (100 mLs Intravenous Contrast Given 01/03/19 2044)  Mobility walks Low fall risk   Focused Assessments Cardiac Assessment Handoff:    Lab Results  Component Value Date   CKTOTAL 91 10/14/2011   CKMB 0.6 10/14/2011   TROPONINI <0.03 04/17/2018   No results found for: DDIMER Does the Patient currently have chest pain? No     R Recommendations: See Admitting Provider Note  Report given to:   Additional Notes:

## 2019-01-03 NOTE — ED Notes (Addendum)
Pt given urinal @ 1630 to obtain sample. Returned to room to find pt laying face down on stretcher having removed all monitoring cords. Asked pt why he removed cords, pt stated he had to walk to bathroom. Pt reports he forgot about sample and urinated in toilet. Pt aware of continued need for sample. EDP Triplett notified.

## 2019-01-03 NOTE — H&P (Signed)
[facility] [facility] [facility] NAME: Andrew Gibbs    MR#:  [id number]  DATE OF BIRTH:  [birth year]  DATE OF ADMISSION:  01/03/2019  PRIMARY CARE PHYSICIAN: [facility] [place]   REQUESTING/REFERRING PHYSICIAN: Sherrie George, CRNP  CHIEF COMPLAINT:   Chief Complaint  Patient presents with   Back Pain    HISTORY OF PRESENT ILLNESS:  Andrew Gibbs  is a 63 y.o. 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  Patient had EGD on 12/24/2018 ,with Dr. Jonathon Bellows, demonstrating a small esophageal varices.  He presented to the emergency room today for evaluation of lower back pain described as aching with a pain score 8 out of 10.  Pain has been present for 2 to 3 months.  Patient denies injury.  He denies dysuria or foul urine odor.  Pain is made worse when sitting.  He was evaluated in May for similar symptoms of lower back pain with normal x-rays of lumbar spine, sacrum, and coccyx at that time.  He has continued to take Flexeril with only minimal improvement.  Patient shows no difficulty restriction in movement as he is able to turn from side to side as well as raise himself from a lying to a sitting position.  No evidence of radiculopathy noted.  Chest x-ray today shows no acute pulmonary disease.    CT abdomen demonstrates no evidence of aortic dissection or aneurysm.  No acute abnormality in the chest, abdomen, or pelvis.  However there is evidence of markedly cirrhotic liver with mass consistent with patient's known history of hepatocellular carcinoma.  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  There is evidence of epidural tumor as well as tumor extending into the neural foramina.  On review of patient's records, he is being seen by Dr. Grayland Ormond with oncology.  Shortly after patient's arrival to the  emergency room, he was noted to vomit a large amount of dark red clots followed by complaints of abdominal pain.  Patient reports he has not had similar hematemesis recently.  Current hemoglobin is 9.8 with hematocrit 30.1.  INR is 1.2.  Octreotide and Protonix infusions were initiated in the emergency room.  Patient was noted to be hyperkalemic as well with potassium of 5.4.  He denies experiencing chest pain, palpitations.  He received lactulose in the emergency room.  We have admitted him to the hospitalist service for further management.    PAST MEDICAL HISTORY:   Past Medical History:  Diagnosis Date   Blood dyscrasia    thrombocytopenia   Hepatitis C    Heroin use     PAST SURGICAL HISTORY:   Past Surgical History:  Procedure Laterality Date   ESOPHAGOGASTRODUODENOSCOPY (EGD) WITH PROPOFOL N/A 12/24/2018   Procedure: ESOPHAGOGASTRODUODENOSCOPY (EGD) WITH PROPOFOL;  Surgeon: Jonathon Bellows, MD;  Location: [facility] ENDOSCOPY;  Service: Gastroenterology;  Laterality: N/A;   IR ANGIOGRAM SELECTIVE EACH ADDITIONAL VESSEL  10/17/2018   IR ANGIOGRAM SELECTIVE EACH ADDITIONAL VESSEL  10/17/2018   IR ANGIOGRAM SELECTIVE EACH ADDITIONAL VESSEL  10/17/2018   IR ANGIOGRAM SELECTIVE EACH ADDITIONAL VESSEL  10/17/2018   IR ANGIOGRAM SELECTIVE EACH ADDITIONAL VESSEL  10/17/2018   IR ANGIOGRAM SELECTIVE EACH ADDITIONAL VESSEL  10/17/2018   IR ANGIOGRAM SELECTIVE EACH ADDITIONAL VESSEL  10/17/2018   IR ANGIOGRAM SELECTIVE EACH ADDITIONAL VESSEL  11/05/2018   IR ANGIOGRAM SELECTIVE  EACH ADDITIONAL VESSEL  11/05/2018   IR ANGIOGRAM SELECTIVE EACH ADDITIONAL VESSEL  11/05/2018   IR ANGIOGRAM VISCERAL SELECTIVE  10/17/2018   IR ANGIOGRAM VISCERAL SELECTIVE  10/17/2018   IR ANGIOGRAM VISCERAL SELECTIVE  11/05/2018   IR EMBO ARTERIAL NOT HEMORR HEMANG INC GUIDE ROADMAPPING  10/17/2018   IR EMBO TUMOR ORGAN ISCHEMIA INFARCT INC GUIDE ROADMAPPING  11/05/2018   IR RADIOLOGIST EVAL & MGMT  05/22/2018   IR  RADIOLOGIST EVAL & MGMT  08/27/2018   IR US GUIDE VASC ACCESS RIGHT  10/17/2018   IR US GUIDE VASC ACCESS RIGHT  11/05/2018   KNEE SURGERY     TEE WITHOUT CARDIOVERSION N/A 04/22/2018   Procedure: TRANSESOPHAGEAL ECHOCARDIOGRAM (TEE);  Surgeon: Teodoro Spray, MD;  Location: [facility] ORS;  Service: Cardiovascular;  Laterality: N/A;    SOCIAL HISTORY:   Social History   Tobacco Use   Smoking status: Current Every Day Smoker    Packs/day: 0.50    Years: 40.00    Pack years: 20.00   Smokeless tobacco: Never Used  Substance Use Topics   Alcohol use: Not Currently    Comment: 40 oz today    FAMILY HISTORY:   Family History  Problem Relation Age of Onset   Multiple myeloma Mother     DRUG ALLERGIES:  No Known Allergies  REVIEW OF SYSTEMS:   Review of Systems  Constitutional: Positive for malaise/fatigue. Negative for chills, diaphoresis and fever.  HENT: Negative for congestion, nosebleeds, sinus pain and sore throat.   Eyes: Negative for blurred vision, double vision and pain.  Respiratory: Negative for cough, hemoptysis, shortness of breath and wheezing.   Cardiovascular: Negative for chest pain, palpitations and leg swelling.  Gastrointestinal: Positive for abdominal pain, nausea and vomiting (hematemesis). Negative for blood in stool, constipation, diarrhea, heartburn and melena.  Genitourinary: Negative for dysuria and hematuria.  Musculoskeletal: Positive for back pain (low back pain-nonradiating). Negative for falls.  Skin: Negative for itching and rash.  Neurological: Positive for weakness (generalized). Negative for dizziness and headaches.  Psychiatric/Behavioral: Positive for substance abuse (Denies use since October 2019).    MEDICATIONS AT HOME:   Prior to Admission medications   Medication Sig Start Date End Date Taking? Authorizing Provider  cyclobenzaprine (FLEXERIL) 10 MG tablet Take 1 tablet (10 mg total) by mouth 3 (three) times daily as needed.  12/02/18  Yes Fisher, Linden Dolin, PA-C  Multiple Vitamin (MULTIVITAMIN WITH MINERALS) TABS tablet Take 1 tablet by mouth daily. 05/03/18  Yes Epifanio Lesches, MD  nadolol (CORGARD) 20 MG tablet Take 20 mg by mouth daily. 12/25/18  Yes [provider]  pantoprazole (PROTONIX) 40 MG tablet Take 1 tablet (40 mg total) by mouth daily. 11/05/18  Yes Corrie Mckusick, DO  traMADol (ULTRAM) 50 MG tablet Take 1 tablet (50 mg total) by mouth every 6 (six) hours as needed. 12/19/18  Yes Lloyd Huger, MD      VITAL SIGNS:  Blood pressure 127/70, pulse 83, temperature 98.7 F (37.1 C), temperature source Oral, resp. rate 19, height '5\' 7"'  (1.702 m), weight 58 kg, SpO2 98 %.  PHYSICAL EXAMINATION:  Physical Exam Vitals signs and nursing note reviewed.  Constitutional:      General: He is not in acute distress.    Appearance: He is ill-appearing.  HENT:     Head: Normocephalic.     Right Ear: External ear normal.     Left Ear: External ear normal.     Nose: Nose normal.  Mouth/Throat:     Mouth: Mucous membranes are moist.     Pharynx: Oropharynx is clear.  Eyes:     General: Scleral icterus present.     Extraocular Movements: Extraocular movements intact.     Conjunctiva/sclera: Conjunctivae normal.     Pupils: Pupils are equal, round, and reactive to light.  Neck:     Musculoskeletal: Normal range of motion and neck supple. No muscular tenderness.  Cardiovascular:     Rate and Rhythm: Normal rate and regular rhythm.     Pulses: Normal pulses.     Heart sounds: Normal heart sounds. No murmur. No friction rub. No gallop.   Pulmonary:     Effort: Pulmonary effort is normal. No respiratory distress.     Breath sounds: Normal breath sounds. No wheezing, rhonchi or rales.  Chest:     Chest wall: No tenderness.  Abdominal:     General: Abdomen is flat. Bowel sounds are normal.     Palpations: Abdomen is soft.     Tenderness: There is abdominal tenderness. There is no guarding or  rebound.  Musculoskeletal: Normal range of motion.        General: No swelling or tenderness.     Right lower leg: No edema.     Left lower leg: No edema.  Lymphadenopathy:     Cervical: No cervical adenopathy.  Skin:    General: Skin is warm and dry.     Capillary Refill: Capillary refill takes less than 2 seconds.     Findings: No rash.  Neurological:     General: No focal deficit present.     Mental Status: He is alert and oriented to person, place, and time.     Cranial Nerves: No cranial nerve deficit.     Sensory: No sensory deficit.  Psychiatric:        Behavior: Behavior normal.        Thought Content: Thought content normal.      LABORATORY PANEL:   CBC Recent Labs  Lab 01/03/19 1503  WBC 11.8*  HGB 9.8*  HCT 30.1*  PLT 171   ------------------------------------------------------------------------------------------------------------------  Chemistries  Recent Labs  Lab 01/03/19 1503  NA 137  K 5.4*  CL 108  CO2 18*  GLUCOSE 101*  BUN 20  CREATININE 0.52*  CALCIUM 8.3*  AST 431*  ALT 97*  ALKPHOS 194*  BILITOT 2.8*   ------------------------------------------------------------------------------------------------------------------  Cardiac Enzymes No results for input(s): TROPONINI in the last 168 hours. ------------------------------------------------------------------------------------------------------------------  RADIOLOGY:  Dg Chest 2 View  Result Date: 01/03/2019 CLINICAL DATA:  Low back pain for 2 months. History of hepatitis C, IV drug use, liver cancer. EXAM: CHEST - 2 VIEW COMPARISON:  Chest x-rays dated 04/17/2018 and 02/13/2018. FINDINGS: The heart size and mediastinal contours are within normal limits. Both lungs are clear. The visualized skeletal structures are unremarkable. IMPRESSION: No active cardiopulmonary disease. Electronically Signed   By: Franki Cabot M.D.   On: 01/03/2019 17:41   Ct Angio Chest/abd/pel For Dissection W  And/or W/wo  Result Date: 01/03/2019 CLINICAL DATA:  Low back and generalized abdominal pain for 2 months. EXAM: CT ANGIOGRAPHY CHEST, ABDOMEN AND PELVIS TECHNIQUE: Multidetector CT imaging through the chest, abdomen and pelvis was performed using the standard protocol during bolus administration of intravenous contrast. Multiplanar reconstructed images and MIPs were obtained and reviewed to evaluate the vascular anatomy. CONTRAST:  100 mL ISOVUE-370 IOPAMIDOL (ISOVUE-370) INJECTION 76% COMPARISON:  CT chest 10/16/2018. CT angiogram abdomen and pelvis 08/27/2018. FINDINGS:  CTA CHEST FINDINGS Cardiovascular: Preferential opacification of the thoracic aorta. No evidence of thoracic aortic aneurysm or dissection. Normal heart size. No pericardial effusion. Mediastinum/Nodes: No enlarged mediastinal, hilar, or axillary lymph nodes. Thyroid gland, trachea, and esophagus demonstrate no significant findings. Lungs/Pleura: No pleural effusion. Lungs demonstrate mild dependent atelectasis. There is some emphysematous change in the apices. No nodule, mass or consolidative process. Musculoskeletal: Lytic lesions are seen and T2 and T3. Mild superior endplate compression fracture of T2 noted. Mixed lytic and sclerotic lesion is also present at T8. Paraspinous and epidural soft tissue tumor on the right are seen extending out of the T8 vertebral body. Tumor contacts the cord and extends into the right T7-8 foramen. There is a lytic lesion in the left T4 pedicle and medial margin of the left fourth rib. Review of the MIP images confirms the above findings. CTA ABDOMEN AND PELVIS FINDINGS VASCULAR Aorta: Normal caliber aorta without aneurysm, dissection, vasculitis or significant stenosis. Celiac: Patent without evidence of aneurysm, dissection, vasculitis or significant stenosis. SMA: Patent without evidence of aneurysm, dissection, vasculitis or significant stenosis. Renals: Both renal arteries are patent without evidence of  aneurysm, dissection, vasculitis, fibromuscular dysplasia or significant stenosis. Small accessory renal arteries on the right and left noted. IMA: Patent without evidence of aneurysm, dissection, vasculitis or significant stenosis. Inflow: Patent without evidence of aneurysm, dissection, vasculitis or significant stenosis. Veins: No obvious venous abnormality within the limitations of this arterial phase study. Review of the MIP images confirms the above findings. NON-VASCULAR Hepatobiliary: The liver is markedly cirrhotic. Round lesion in the liver measuring 4.0 x 4.5 cm in the right hepatic lobe consistent with the patient's history of hepatocellular carcinoma is noted. 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 Biliary tree is unremarkable. Vascular coils in the porta hepatis are identified. Pancreas: Unremarkable. No pancreatic ductal dilatation or surrounding inflammatory changes. Spleen: No focal lesion.  Splenomegaly. Adrenals/Urinary Tract: Renal cysts are noted. The kidneys otherwise appear normal. Ureters and urinary bladder are unremarkable. Adrenal glands appear normal. Stomach/Bowel: Stomach is within normal limits. Appendix appears normal. No evidence of bowel wall thickening, distention, or inflammatory changes. Lymphatic: No lymphadenopathy. Reproductive: Prostate is unremarkable. Other: No free or focal fluid collection. Musculoskeletal: There is a large lytic lesion and soft tissue mass in the posterior aspect of the right L1 vertebral body and pedicle. Tumor extends into the right L1-2 neural foramen and extends into the right epidural space. Second destructive lesion is seen on the right at L3 where epidural tumor extends into the thecal sac and right L3-4 foramen. Smaller lytic lesions in L4, L5 and pelvis are noted. Review of the MIP images confirms the above findings. IMPRESSION: Negative for aortic dissection or aneurysm. No acute abnormality chest, abdomen or pelvis. Markedly cirrhotic liver with  a mass consistent with the patient's known a hepatocellular carcinoma. 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 These findings could be better evaluated with MRI with and without contrast. Electronically Signed   By: Inge Rise M.D.   On: 01/03/2019 21:28      IMPRESSION AND PLAN:   1.  GI bleed--patient sees Dr.Anna Kiran, gastroenterologist - Octreotide and Protonix infusions initiated in the emergency room and continued - Gastroenterology, Dr. Allen Norris, consulted for further evaluation and recommendations given patient's history of esophageal varices, hepatitis C, liver cancer, and previous upper GI  bleed. - Every 8 hour hemoglobin and hematocrit.  We will transfuse if needed - We will get stool for occult blood  2.  Hyperkalemia - Potassium 5.4.  Patient received p.o. lactulose - We will repeat potassium level in the a.m. - He is on telemetry monitoring  3.  Low back pain - Likely resulting from progression of tumor in lumbar spine.  Will consult oncology for recommendations.  Patient sees Dr. Grayland Ormond outpatient. -We will treat pain with analgesic  4.  History of polysubstance abuse, EtOH, IV drug abuse/heroin - CIWA protocol initiated  DVT prophylaxis with SCDs and PPI therapy initiated    All the records are reviewed and case discussed with ED provider. The plan of care was discussed in details with the patient (and family). I answered all questions. The patient agreed to proceed with the above mentioned plan. Further management will depend upon hospital course.   CODE STATUS: Full code  TOTAL TIME TAKING CARE OF THIS PATIENT: 45 minutes.    [place] on 01/03/2019 at 9:58 PM  Pager - [phone]  After 6pm go to [email or web address] - Technical brewer [place] [facility]   Office  [phone]  CC: Primary care physician; [facility]   Note: This dictation was prepared with Dragon dictation along with smaller phrase technology. Any transcriptional errors that result from this process are unintentional.

## 2019-01-03 NOTE — ED Notes (Signed)
Phone, pants, shirt and black flip flops placed into belonging bag and given to pt.

## 2019-01-03 NOTE — ED Provider Notes (Signed)
[facility] Emergency Department Provider Note  ____________________________________________   None    (approximate)  I have reviewed the triage vital signs and the nursing notes.   HISTORY  Chief Complaint Back Pain   HPI Andrew Gibbs is a 63 y.o. male who presents to the emergency department for treatment and evaluation of back pain.   Patient denies any recent injury.  He states that he has taken his prescription medication without any relief.    Past Medical History:  Diagnosis Date  . Blood dyscrasia    thrombocytopenia  . Hepatitis C   . Heroin use     Patient Active Problem List   Diagnosis Date Noted  . Candidemia ([place]) 07/23/2018  . Discitis of cervical region 07/23/2018  . 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 Hepatocellular carcinoma ([place]) 05/17/2018  . Malnutrition of moderate degree 04/28/2018  . Abscess of right arm 04/17/2018    Past Surgical History:  Procedure Laterality Date  . ESOPHAGOGASTRODUODENOSCOPY (EGD) WITH PROPOFOL N/A 12/24/2018   Procedure: ESOPHAGOGASTRODUODENOSCOPY (EGD) WITH PROPOFOL;  Surgeon: Jonathon Bellows, MD;  Location: [facility] ENDOSCOPY;  Service: Gastroenterology;  Laterality: N/A;  . IR ANGIOGRAM SELECTIVE EACH ADDITIONAL VESSEL  10/17/2018  . IR ANGIOGRAM SELECTIVE EACH ADDITIONAL VESSEL  10/17/2018  . IR ANGIOGRAM SELECTIVE EACH ADDITIONAL VESSEL  10/17/2018  . IR ANGIOGRAM SELECTIVE EACH ADDITIONAL VESSEL  10/17/2018  . IR ANGIOGRAM SELECTIVE EACH ADDITIONAL VESSEL  10/17/2018  . IR ANGIOGRAM SELECTIVE EACH ADDITIONAL VESSEL  10/17/2018  . IR ANGIOGRAM SELECTIVE EACH ADDITIONAL VESSEL  10/17/2018  . IR ANGIOGRAM SELECTIVE EACH ADDITIONAL VESSEL  11/05/2018  . IR ANGIOGRAM SELECTIVE EACH ADDITIONAL VESSEL  11/05/2018  . IR ANGIOGRAM SELECTIVE EACH ADDITIONAL VESSEL  11/05/2018  . IR ANGIOGRAM VISCERAL SELECTIVE  10/17/2018  . IR ANGIOGRAM VISCERAL SELECTIVE  10/17/2018  . IR ANGIOGRAM VISCERAL SELECTIVE  11/05/2018   . IR EMBO ARTERIAL NOT HEMORR HEMANG INC GUIDE ROADMAPPING  10/17/2018  . IR EMBO TUMOR ORGAN ISCHEMIA INFARCT INC GUIDE ROADMAPPING  11/05/2018  . IR RADIOLOGIST EVAL & MGMT  05/22/2018  . IR RADIOLOGIST EVAL & MGMT  08/27/2018  . IR US GUIDE VASC ACCESS RIGHT  10/17/2018  . IR US GUIDE VASC ACCESS RIGHT  11/05/2018  . KNEE SURGERY    . TEE WITHOUT CARDIOVERSION N/A 04/22/2018   Procedure: TRANSESOPHAGEAL ECHOCARDIOGRAM (TEE);  Surgeon: Teodoro Spray, MD;  Location: [facility] ORS;  Service: Cardiovascular;  Laterality: N/A;    Prior to Admission medications   Medication Sig Start Date End Date Taking? Authorizing Provider  cyclobenzaprine (FLEXERIL) 10 MG tablet Take 1 tablet (10 mg total) by mouth 3 (three) times daily as needed. 12/02/18  Yes Fisher, Linden Dolin, PA-C  Multiple Vitamin (MULTIVITAMIN WITH MINERALS) TABS tablet Take 1 tablet by mouth daily. 05/03/18  Yes Epifanio Lesches, MD  nadolol (CORGARD) 20 MG tablet Take 20 mg by mouth daily. 12/25/18  Yes [provider]  pantoprazole (PROTONIX) 40 MG tablet Take 1 tablet (40 mg total) by mouth daily. 11/05/18  Yes Corrie Mckusick, DO  traMADol (ULTRAM) 50 MG tablet Take 1 tablet (50 mg total) by mouth every 6 (six) hours as needed. 12/19/18  Yes Lloyd Huger, MD    Allergies Patient has no known allergies.  Family History  Problem Relation Age of Onset  . Multiple myeloma Mother     Social History Social History   Tobacco Use  . Smoking status: Current Every Day Smoker  Packs/day: 0.50    Years: 40.00    Pack years: 20.00  . Smokeless tobacco: Never Used  Substance Use Topics  . Alcohol use: Not Currently    Comment: 40 oz today  . Drug use: Not Currently    Types: IV, Cocaine, Heroin, Marijuana    Comment: heroin, states he used to use cocaine    Review of Systems  Constitutional: No fever/chills Eyes: No visual changes. ENT: No sore throat. Cardiovascular: Denies chest pain. Respiratory: Denies  shortness of breath. Gastrointestinal: No abdominal pain.  No nausea, no vomiting.  No diarrhea.  No constipation. Genitourinary: Negative for dysuria. Musculoskeletal: Positive for back pain. Skin: Negative for rash. Neurological: Negative for headaches, focal weakness or numbness. ____________________________________________   PHYSICAL EXAM:  VITAL SIGNS: ED Triage Vitals  Enc Vitals Group     BP 01/03/19 1440 (!) 94/57     Pulse Rate 01/03/19 1440 88     Resp 01/03/19 1440 18     Temp 01/03/19 1440 98.7 F (37.1 C)     Temp Source 01/03/19 1440 Oral     SpO2 01/03/19 1440 100 %     Weight 01/03/19 1519 127 lb 13.9 oz (58 kg)     Height 01/03/19 1519 '5\' 7"'  (1.702 m)     Head Circumference --      Peak Flow --      Pain Score 01/03/19 1440 10     Pain Loc --      Pain Edu? --      Excl. in [place]? --     Constitutional: Alert and oriented. Well appearing and in no acute distress. Eyes: Conjunctivae are normal. PERRL. EOMI. Head: Atraumatic. Nose: No congestion/rhinnorhea. Mouth/Throat: Mucous membranes are moist.  Oropharynx non-erythematous. Neck: No stridor.   Cardiovascular: Normal rate, regular rhythm. Grossly normal heart sounds.  Good peripheral circulation. Respiratory: Normal respiratory effort.  No retractions. Lungs CTAB. Gastrointestinal: Soft and nontender. No distention. No abdominal bruits. No CVA tenderness. Musculoskeletal: No lower extremity tenderness nor edema.  No joint effusions. Neurologic:  Normal speech and language. No gross focal neurologic deficits are appreciated. No gait instability. Skin:  Skin is warm, dry and intact. No rash noted. Psychiatric: Mood and affect are normal. Speech and behavior are normal.  ____________________________________________   LABS (all labs ordered are listed, but only abnormal results are displayed)  Labs Reviewed  COMPREHENSIVE METABOLIC PANEL - Abnormal; Notable for the following components:      Result Value    Potassium 5.4 (*)    CO2 18 (*)    Glucose, Bld 101 (*)    Creatinine, Ser 0.52 (*)    Calcium 8.3 (*)    Albumin 2.0 (*)    AST 431 (*)    ALT 97 (*)    Alkaline Phosphatase 194 (*)    Total Bilirubin 2.8 (*)    All other components within normal limits  CBC WITH DIFFERENTIAL/PLATELET - Abnormal; Notable for the following components:   WBC 11.8 (*)    RBC 3.09 (*)    Hemoglobin 9.8 (*)    HCT 30.1 (*)    RDW 21.1 (*)    Neutro Abs 8.4 (*)    Abs Immature Granulocytes 0.17 (*)    All other components within normal limits  SARS CORONAVIRUS 2 (HOSPITAL ORDER, [place] [facility])  URINALYSIS, COMPLETE (UACMP) WITH MICROSCOPIC  PROTIME-INR  TYPE AND SCREEN   ____________________________________________  EKG  Pending. ____________________________________________  RADIOLOGY  ED  MD interpretation:  Chest x-ray normal.  Official radiology report(s): Dg Chest 2 View  Result Date: 01/03/2019 CLINICAL DATA:  Low back pain for 2 months. History of hepatitis C, IV drug use, liver cancer. EXAM: CHEST - 2 VIEW COMPARISON:  Chest x-rays dated 04/17/2018 and 02/13/2018. FINDINGS: The heart size and mediastinal contours are within normal limits. Both lungs are clear. The visualized skeletal structures are unremarkable. IMPRESSION: No active cardiopulmonary disease. Electronically Signed   By: Franki Cabot M.D.   On: 01/03/2019 17:41    ____________________________________________   PROCEDURES  Procedure(s) performed: None  Procedures  Critical Care performed: No  ____________________________________________   INITIAL IMPRESSION / ASSESSMENT AND PLAN / ED COURSE  63 year old male with a history of cirrhosis of the liver, hepatocellular carcinoma, heroin use, and hepatitis C presents emergency department for evaluation of back pain.  It appears that he was evaluated for the same in May and had x-rays of the lumbar spine, sacrum, and coccyx which were all  normal.  He refused MRI at that time.  Today, he is states that he has not had any improvement with his "pain pill" that assumed to be Flexeril.  Patient was able to turn to the right and left side without assistance.  He was able to raise himself to a sitting position for back evaluation.  He was able to perform straight leg raise and did not report any radiculopathy.  Because of his chronic illnesses and recent hospitalization, labs ordered.  ----------------------------------------- 7:16 PM on 01/03/2019 -----------------------------------------  Patient vomited large, dark red clots of blood. Patient now complains of lower abdominal pain in addition to his back pain. Plan to CTA and get him admitted.  ----------------------------------------- 8:13 PM on 01/03/2019 ----------------------------------------- IV infiltrated.  Patient is difficult stick can nursing staff have been unable to obtain a line.  Patient will be admitted at this time.  IV team has been consulted.  COVID testing is pending. ____________________________________________   FINAL CLINICAL IMPRESSION(S) / ED DIAGNOSES  Final diagnoses:  Hematemesis with nausea  Other acute back pain     ED Discharge Orders    None       Note:  This document was prepared using Dragon voice recognition software and may include unintentional dictation errors.    Victorino Dike, FNP 01/03/19 2052    Lavonia Drafts, MD 01/03/19 2241

## 2019-01-03 NOTE — ED Notes (Addendum)
Pt refusing to get cleansed of incontinent stool and refusing COVID swab. Dr. Corky Downs notified. Pt refuses to leave blood pressure cuff and pox probe in place. Pt swatting at RN when RN attempting to provide care. Pt states "leave me alone".

## 2019-01-03 NOTE — ED Notes (Signed)
Pt refusing Covid swab multiple times and jerking head away Pt refusing clean up, appears to not like to be touched

## 2019-01-03 NOTE — ED Notes (Signed)
Entered room to give norflex injection. Pt sat upright abruptly and vomited copious amount of blood, noted some bright red streaks but mostly large dark red clots present in emesis. Pt attempting to get out of bed, repeatedly saying, "woman let go! I want to leave." Called EDP Triplett to bedside. Pt assisted back into bed with 2 staff members. VS checked. Pt states he does NOT want RN to call any of his family members at this time. VS WNL. NS bolus started.

## 2019-01-03 NOTE — ED Notes (Signed)
Lab called for venipuncture assist.

## 2019-01-03 NOTE — ED Notes (Signed)
PT making grunting sounds and decreased verbal responsiveness noted on rounding by this RN. MD called to bedside and pt awake, smiling, and talking.

## 2019-01-03 NOTE — ED Notes (Signed)
Unable to obtain type and screen, no blood return from IV. Oncoming RN notified, will call lab to attempt.

## 2019-01-04 ENCOUNTER — Encounter: Payer: Self-pay | Admitting: Certified Registered"

## 2019-01-04 ENCOUNTER — Encounter
Admission: EM | Disposition: A | Discharge status: Home Health | Payer: Self-pay | Source: Home / Self Care | Attending: Internal Medicine

## 2019-01-04 DIAGNOSIS — K920 Hematemesis: Secondary | ICD-10-CM

## 2019-01-04 DIAGNOSIS — C220 Liver cell carcinoma: Secondary | ICD-10-CM

## 2019-01-04 LAB — URINALYSIS, COMPLETE (UACMP) WITH MICROSCOPIC
Bacteria, UA: NONE SEEN
Bilirubin Urine: NEGATIVE
Glucose, UA: NEGATIVE mg/dL
Hgb urine dipstick: NEGATIVE
Ketones, ur: NEGATIVE mg/dL
Leukocytes,Ua: NEGATIVE
Nitrite: NEGATIVE
Protein, ur: NEGATIVE mg/dL
Specific Gravity, Urine: 1.029 (ref 1.005–1.030)
pH: 6 (ref 5.0–8.0)

## 2019-01-04 LAB — CBC
HCT: 27.5 % — ABNORMAL LOW (ref 39.0–52.0)
Hemoglobin: 8.8 g/dL — ABNORMAL LOW (ref 13.0–17.0)
MCH: 31.3 pg (ref 26.0–34.0)
MCHC: 32 g/dL (ref 30.0–36.0)
MCV: 97.9 fL (ref 80.0–100.0)
Platelets: 181 10*3/uL (ref 150–400)
RBC: 2.81 MIL/uL — ABNORMAL LOW (ref 4.22–5.81)
RDW: 21.5 % — ABNORMAL HIGH (ref 11.5–15.5)
WBC: 9.7 10*3/uL (ref 4.0–10.5)
nRBC: 0 % (ref 0.0–0.2)

## 2019-01-04 LAB — SARS CORONAVIRUS 2 BY RT PCR (HOSPITAL ORDER, PERFORMED IN ~~LOC~~ HOSPITAL LAB): SARS Coronavirus 2: NEGATIVE

## 2019-01-04 LAB — BASIC METABOLIC PANEL
Anion gap: 10 (ref 5–15)
BUN: 24 mg/dL — ABNORMAL HIGH (ref 8–23)
CO2: 17 mmol/L — ABNORMAL LOW (ref 22–32)
Calcium: 8.7 mg/dL — ABNORMAL LOW (ref 8.9–10.3)
Chloride: 118 mmol/L — ABNORMAL HIGH (ref 98–111)
Creatinine, Ser: 0.66 mg/dL (ref 0.61–1.24)
GFR calc Af Amer: >60 mL/min (ref >60)
GFR calc non Af Amer: >60 mL/min (ref >60)
Glucose, Bld: 113 mg/dL — ABNORMAL HIGH (ref 70–99)
Potassium: 4.3 mmol/L (ref 3.5–5.1)
Sodium: 145 mmol/L (ref 135–145)

## 2019-01-04 LAB — URINE DRUG SCREEN, QUALITATIVE (ARMC ONLY)
Amphetamines, Ur Screen: NOT DETECTED
Barbiturates, Ur Screen: NOT DETECTED
Benzodiazepine, Ur Scrn: NOT DETECTED
Cannabinoid 50 Ng, Ur ~~LOC~~: NOT DETECTED
Cocaine Metabolite,Ur ~~LOC~~: NOT DETECTED
MDMA (Ecstasy)Ur Screen: NOT DETECTED
Methadone Scn, Ur: NOT DETECTED
Opiate, Ur Screen: NOT DETECTED
Phencyclidine (PCP) Ur S: NOT DETECTED
Tricyclic, Ur Screen: NOT DETECTED

## 2019-01-04 LAB — HEMOGLOBIN AND HEMATOCRIT, BLOOD
HCT: 22.4 % — ABNORMAL LOW (ref 39.0–52.0)
HCT: 24.4 % — ABNORMAL LOW (ref 39.0–52.0)
Hemoglobin: 7.3 g/dL — ABNORMAL LOW (ref 13.0–17.0)
Hemoglobin: 7.7 g/dL — ABNORMAL LOW (ref 13.0–17.0)

## 2019-01-04 LAB — PROTIME-INR
INR: 1.3 — ABNORMAL HIGH (ref 0.8–1.2)
Prothrombin Time: 16.3 seconds — ABNORMAL HIGH (ref 11.4–15.2)

## 2019-01-04 SURGERY — ESOPHAGOGASTRODUODENOSCOPY (EGD) WITH PROPOFOL
Anesthesia: General

## 2019-01-04 MED ORDER — MORPHINE SULFATE (PF) 4 MG/ML IV SOLN
4.0000 mg | INTRAVENOUS | Status: DC | PRN
  Administered 2019-01-04 – 2019-01-07 (×7): 4 mg via INTRAVENOUS
  Filled 2019-01-04 (×7): qty 1

## 2019-01-04 MED ORDER — LORAZEPAM 2 MG/ML IJ SOLN
1.0000 mg | INTRAMUSCULAR | Status: DC | PRN
  Administered 2019-01-07 – 2019-01-09 (×5): 1 mg via INTRAVENOUS
  Filled 2019-01-04 (×5): qty 1

## 2019-01-04 MED ORDER — DEXAMETHASONE SODIUM PHOSPHATE 4 MG/ML IJ SOLN
4.0000 mg | Freq: Three times a day (TID) | INTRAMUSCULAR | Status: AC
  Administered 2019-01-04 – 2019-01-07 (×9): 4 mg via INTRAVENOUS
  Filled 2019-01-04 (×9): qty 1

## 2019-01-04 MED ORDER — LORAZEPAM 1 MG PO TABS: 1.0000 mg | ORAL_TABLET | Freq: Four times a day (QID) | ORAL | Status: DC | PRN

## 2019-01-04 MED ORDER — PANTOPRAZOLE SODIUM 40 MG IV SOLR
40.0000 mg | Freq: Two times a day (BID) | INTRAVENOUS | Status: DC
  Administered 2019-01-04 – 2019-01-07 (×7): 40 mg via INTRAVENOUS
  Filled 2019-01-04 (×8): qty 40

## 2019-01-04 NOTE — Consult Note (Signed)
Hematology/Oncology Consult note [facility] [phone] [phone]  Patient Care Team: Center, [facility] as PCP - General (General Practice) Clent Jacks, RN as Registered Nurse   Name of the patient: Andrew Gibbs  [id number]  1955/09/23    Reason for consult: Hepatocellular carcinoma   Requesting physician: Dr. Tressia Miners  Date of visit: 01/04/2019    History of presenting illness- Patient is a 63 year old male with a history of locally advanced hepatocellular carcinoma.  He recently underwent ablation with Y 90 in April 2020.  He has not been on any systemic therapy yet.  His other medical problems include IV drug use, hepatitis C, alcohol abuse.  He presented to the ER with symptoms of worsening low back pain which has been ongoing for last 3 months has been acutely worse.  In the ER patient had a large bout of emesis followed by complaint abdominal pain CBC showed white count of 9.7, H&H of 8.8/27.5 and a platelet count of 181.  Baseline hemoglobin runs between 11-12.  Given his back pain patient also had a CT angios chest which did not reveal any aortic dissection or aneurysm.  Cirrhosis of the liver noted.  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  Cardiology consulted for further management  ECOG PS- 2  Pain scale- 7   Review of systems- Review of Systems  Constitutional: Positive for malaise/fatigue. Negative for chills, fever and weight loss.  HENT: Negative for congestion, ear discharge and nosebleeds.   Eyes: Negative for blurred vision.  Respiratory: Negative for cough, hemoptysis, sputum production, shortness of breath and wheezing.   Cardiovascular: Negative for chest pain, palpitations, orthopnea and claudication.  Gastrointestinal: Negative for  abdominal pain, blood in stool, constipation, diarrhea, heartburn, melena, nausea and vomiting.  Genitourinary: Negative for dysuria, flank pain, frequency, hematuria and urgency.  Musculoskeletal: Positive for back pain. Negative for joint pain and myalgias.  Skin: Negative for rash.  Neurological: Negative for dizziness, tingling, focal weakness, seizures, weakness and headaches.  Endo/Heme/Allergies: Does not bruise/bleed easily.  Psychiatric/Behavioral: Negative for depression and suicidal ideas. The patient does not have insomnia.     No Known Allergies  Patient Active Problem List   Diagnosis Date Noted  . GI bleed 01/03/2019  . Candidemia ([place]) 07/23/2018  . Discitis of cervical region 07/23/2018  . 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 Hepatocellular carcinoma ([place]) 05/17/2018  . Malnutrition of moderate degree 04/28/2018  . Abscess of right arm 04/17/2018     Past Medical History:  Diagnosis Date  . Blood dyscrasia    thrombocytopenia  . Hepatitis C   . Heroin use      Past Surgical History:  Procedure Laterality Date  . ESOPHAGOGASTRODUODENOSCOPY (EGD) WITH PROPOFOL N/A 12/24/2018   Procedure: ESOPHAGOGASTRODUODENOSCOPY (EGD) WITH PROPOFOL;  Surgeon: Jonathon Bellows, MD;  Location: [facility] ENDOSCOPY;  Service: Gastroenterology;  Laterality: N/A;  . IR ANGIOGRAM SELECTIVE EACH ADDITIONAL VESSEL  10/17/2018  . IR ANGIOGRAM SELECTIVE EACH ADDITIONAL VESSEL  10/17/2018  . IR ANGIOGRAM SELECTIVE EACH ADDITIONAL VESSEL  10/17/2018  . IR ANGIOGRAM SELECTIVE EACH ADDITIONAL VESSEL  10/17/2018  . IR ANGIOGRAM SELECTIVE EACH ADDITIONAL VESSEL  10/17/2018  . IR ANGIOGRAM SELECTIVE EACH ADDITIONAL VESSEL  10/17/2018  . IR ANGIOGRAM SELECTIVE EACH ADDITIONAL VESSEL  10/17/2018  . [place]  ADDITIONAL VESSEL  11/05/2018  . IR ANGIOGRAM SELECTIVE EACH ADDITIONAL VESSEL  11/05/2018  . IR ANGIOGRAM SELECTIVE EACH ADDITIONAL VESSEL  11/05/2018  . IR ANGIOGRAM VISCERAL SELECTIVE   10/17/2018  . IR ANGIOGRAM VISCERAL SELECTIVE  10/17/2018  . IR ANGIOGRAM VISCERAL SELECTIVE  11/05/2018  . IR EMBO ARTERIAL NOT HEMORR HEMANG INC GUIDE ROADMAPPING  10/17/2018  . IR EMBO TUMOR ORGAN ISCHEMIA INFARCT INC GUIDE ROADMAPPING  11/05/2018  . IR RADIOLOGIST EVAL & MGMT  05/22/2018  . IR RADIOLOGIST EVAL & MGMT  08/27/2018  . IR US GUIDE VASC ACCESS RIGHT  10/17/2018  . IR US GUIDE VASC ACCESS RIGHT  11/05/2018  . KNEE SURGERY    . TEE WITHOUT CARDIOVERSION N/A 04/22/2018   Procedure: TRANSESOPHAGEAL ECHOCARDIOGRAM (TEE);  Surgeon: Teodoro Spray, MD;  Location: [facility] ORS;  Service: Cardiovascular;  Laterality: N/A;    Social History   Socioeconomic History  . Marital status: Widowed    Spouse name: Not on file  . Number of children: Not on file  . Years of education: Not on file  . Highest education level: Not on file  Occupational History  . Not on file  Social Needs  . Financial resource strain: Not on file  . Food insecurity    Worry: Not on file    Inability: Not on file  . Transportation needs    Medical: Not on file    Non-medical: Not on file  Tobacco Use  . Smoking status: Current Every Day Smoker    Packs/day: 0.50    Years: 40.00    Pack years: 20.00  . Smokeless tobacco: Never Used  Substance and Sexual Activity  . Alcohol use: Not Currently    Comment: 40 oz today  . Drug use: Not Currently    Types: IV, Cocaine, Heroin, Marijuana    Comment: heroin, states he used to use cocaine  . Sexual activity: Not on file  Lifestyle  . Physical activity    Days per week: Not on file    Minutes per session: Not on file  . Stress: Not on file  Relationships  . Social Herbalist on phone: Not on file    Gets together: Not on file    Attends religious service: Not on file    Active member of club or organization: Not on file    Attends meetings of clubs or organizations: Not on file    Relationship status: Not on file  . Intimate partner violence    Fear  of current or ex partner: Not on file    Emotionally abused: Not on file    Physically abused: Not on file    Forced sexual activity: Not on file  Other Topics Concern  . Not on file  Social History Narrative  . Not on file     Family History  Problem Relation Age of Onset  . Multiple myeloma Mother      Current Facility-Administered Medications:  .  0.9 %  sodium chloride infusion, , Intravenous, Continuous, Seals, Theo Dills, NP, Last Rate: 75 mL/hr at 01/04/19 6045 .  folic acid (FOLVITE) tablet 1 mg, 1 mg, Oral, Daily, Seals, Angela H, NP .  LORazepam (ATIVAN) tablet 1 mg, 1 mg, Oral, Q6H PRN **OR** LORazepam (ATIVAN) injection 1 mg, 1 mg, Intravenous, Q1H PRN, Mansy, Jan A, MD .  multivitamin with minerals tablet 1 tablet, 1 tablet, Oral, Daily, Seals, Angela H, NP .  octreotide (SANDOSTATIN) 500 mcg  in sodium chloride 0.9 % 250 mL (2 mcg/mL) infusion, 50 mcg/hr, Intravenous, Continuous, Seals, Angela H, NP, Last Rate: 25 mL/hr at 01/04/19 0616, 50 mcg/hr at 01/04/19 0616 .  ondansetron (ZOFRAN) tablet 4 mg, 4 mg, Oral, Q6H PRN **OR** ondansetron (ZOFRAN) injection 4 mg, 4 mg, Intravenous, Q6H PRN, Seals, Angela H, NP .  orphenadrine (NORFLEX) injection 60 mg, 60 mg, Intramuscular, Q12H, Triplett, Cari B, FNP, 60 mg at 01/04/19 0636 .  pantoprazole (PROTONIX) EC tablet 40 mg, 40 mg, Oral, Daily, Seals, Angela H, NP .  sodium chloride flush (NS) 0.9 % injection 3 mL, 3 mL, Intravenous, Q12H, Seals, Angela H, NP .  thiamine (VITAMIN B-1) tablet 100 mg, 100 mg, Oral, Daily **OR** thiamine (B-1) injection 100 mg, 100 mg, Intravenous, Daily, Mayer Camel, NP   Physical exam:  Vitals:   01/03/19 2200 01/03/19 2300 01/03/19 2330 01/04/19 0040  BP: 129/62   (!) 126/97  Pulse: 83 94 83 82  Resp: (!) 27     Temp:    98.6 F (37 C)  TempSrc:    Oral  SpO2: 97% 96% 99% 100%  Weight:      Height:       Physical Exam Constitutional:      General: He is not in acute distress.  HENT:     Head: Normocephalic and atraumatic.  Eyes:     Pupils: Pupils are equal, round, and reactive to light.  Neck:     Musculoskeletal: Normal range of motion.  Cardiovascular:     Rate and Rhythm: Normal rate and regular rhythm.     Heart sounds: Normal heart sounds.  Pulmonary:     Effort: Pulmonary effort is normal.     Breath sounds: Normal breath sounds.  Abdominal:     General: Bowel sounds are normal.     Palpations: Abdomen is soft.  Skin:    General: Skin is warm and dry.  Neurological:     General: No focal deficit present.     Mental Status: He is alert and oriented to person, place, and time.     Comments: Strength is 4/5 in b/l lower extremities        CMP Latest Ref Rng & Units 01/04/2019  Glucose 70 - 99 mg/dL 113(H)  BUN 8 - 23 mg/dL 24(H)  Creatinine 0.61 - 1.24 mg/dL 0.66  Sodium 135 - 145 mmol/L 145  Potassium 3.5 - 5.1 mmol/L 4.3  Chloride 98 - 111 mmol/L 118(H)  CO2 22 - 32 mmol/L 17(L)  Calcium 8.9 - 10.3 mg/dL 8.7(L)  Total Protein 6.5 - 8.1 g/dL -  Total Bilirubin 0.3 - 1.2 mg/dL -  Alkaline Phos 38 - 126 U/L -  AST 15 - 41 U/L -  ALT 0 - 44 U/L -   CBC Latest Ref Rng & Units 01/04/2019  WBC 4.0 - 10.5 K/uL 9.7  Hemoglobin 13.0 - 17.0 g/dL 8.8(L)  Hematocrit 39.0 - 52.0 % 27.5(L)  Platelets 150 - 400 K/uL 181    _0 @  Dg Chest 2 View  Result Date: 01/03/2019 CLINICAL DATA:  Low back pain for 2 months. History of hepatitis C, IV drug use, liver cancer. EXAM: CHEST - 2 VIEW COMPARISON:  Chest x-rays dated 04/17/2018 and 02/13/2018. FINDINGS: The heart size and mediastinal contours are within normal limits. Both lungs are clear. The visualized skeletal structures are unremarkable. IMPRESSION: No active cardiopulmonary disease. Electronically Signed   By: Franki Cabot M.D.   On: 01/03/2019  17:41   Ct Angio Chest/abd/pel For Dissection W And/or W/wo  Result Date: 01/03/2019 CLINICAL DATA:  Low back and generalized abdominal pain for  2 months. EXAM: CT ANGIOGRAPHY CHEST, ABDOMEN AND PELVIS TECHNIQUE: Multidetector CT imaging through the chest, abdomen and pelvis was performed using the standard protocol during bolus administration of intravenous contrast. Multiplanar reconstructed images and MIPs were obtained and reviewed to evaluate the vascular anatomy. CONTRAST:  100 mL ISOVUE-370 IOPAMIDOL (ISOVUE-370) INJECTION 76% COMPARISON:  CT chest 10/16/2018. CT angiogram abdomen and pelvis 08/27/2018. FINDINGS: CTA CHEST FINDINGS Cardiovascular: Preferential opacification of the thoracic aorta. No evidence of thoracic aortic aneurysm or dissection. Normal heart size. No pericardial effusion. Mediastinum/Nodes: No enlarged mediastinal, hilar, or axillary lymph nodes. Thyroid gland, trachea, and esophagus demonstrate no significant findings. Lungs/Pleura: No pleural effusion. Lungs demonstrate mild dependent atelectasis. There is some emphysematous change in the apices. No nodule, mass or consolidative process. Musculoskeletal: Lytic lesions are seen and T2 and T3. Mild superior endplate compression fracture of T2 noted. Mixed lytic and sclerotic lesion is also present at T8. Paraspinous and epidural soft tissue tumor on the right are seen extending out of the T8 vertebral body. Tumor contacts the cord and extends into the right T7-8 foramen. There is a lytic lesion in the left T4 pedicle and medial margin of the left fourth rib. Review of the MIP images confirms the above findings. CTA ABDOMEN AND PELVIS FINDINGS VASCULAR Aorta: Normal caliber aorta without aneurysm, dissection, vasculitis or significant stenosis. Celiac: Patent without evidence of aneurysm, dissection, vasculitis or significant stenosis. SMA: Patent without evidence of aneurysm, dissection, vasculitis or significant stenosis. Renals: Both renal arteries are patent without evidence of aneurysm, dissection, vasculitis, fibromuscular dysplasia or significant stenosis. Small accessory  renal arteries on the right and left noted. IMA: Patent without evidence of aneurysm, dissection, vasculitis or significant stenosis. Inflow: Patent without evidence of aneurysm, dissection, vasculitis or significant stenosis. Veins: No obvious venous abnormality within the limitations of this arterial phase study. Review of the MIP images confirms the above findings. NON-VASCULAR Hepatobiliary: The liver is markedly cirrhotic. Round lesion in the liver measuring 4.0 x 4.5 cm in the right hepatic lobe consistent with the patient's history of hepatocellular carcinoma is noted. 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 Biliary tree is unremarkable. Vascular coils in the porta hepatis are identified. Pancreas: Unremarkable. No pancreatic ductal dilatation or surrounding inflammatory changes. Spleen: No focal lesion.  Splenomegaly. Adrenals/Urinary Tract: Renal cysts are noted. The kidneys otherwise appear normal. Ureters and urinary bladder are unremarkable. Adrenal glands appear normal. Stomach/Bowel: Stomach is within normal limits. Appendix appears normal. No evidence of bowel wall thickening, distention, or inflammatory changes. Lymphatic: No lymphadenopathy. Reproductive: Prostate is unremarkable. Other: No free or focal fluid collection. Musculoskeletal: There is a large lytic lesion and soft tissue mass in the posterior aspect of the right L1 vertebral body and pedicle. Tumor extends into the right L1-2 neural foramen and extends into the right epidural space. Second destructive lesion is seen on the right at L3 where epidural tumor extends into the thecal sac and right L3-4 foramen. Smaller lytic lesions in L4, L5 and pelvis are noted. Review of the MIP images confirms the above findings. IMPRESSION: Negative for aortic dissection or aneurysm. No acute abnormality chest, abdomen or pelvis. Markedly cirrhotic liver with a mass consistent with the patient's known a hepatocellular carcinoma. Osseous metastatic disease  has worsened and is most notable at T8, L1 and L3 where there is destruction of bone and epidural tumor  as well as tumor extending into the neural foramina as described above. These findings could be better evaluated with MRI with and without contrast. Electronically Signed   By: Inge Rise M.D.   On: 01/03/2019 21:28    Assessment and plan- Patient is a 63 y.o. male with history of hepatocellular carcinoma status post Y 90 ablation therapy admitted for worsening low back pain found to have osseous metastases  1.  Possible GI bleed: Patient's baseline hemoglobin runs around 11 which is down to 8.8 presently.  There is a concern for GI bleed given his episode of emesis in the ER.  GI has been consulted.  He has a history of esophageal varices and he is currently on octreotide and Protonix.  2.  Low back pain secondary to osseous metastases: Please obtain MRI of cervical thoracic and lumbar spine with and without contrast to better evaluate osseous metastases.  It may also be worthwhile to get a CT abdomen pelvis with contrast to get an idea of his entire systemic picture for his [place]. I have discussed his case with DR. Wohl who is ok with him going on steroids for his back pain. Recommend decadron 4 mg IV Q8. I will also start morphine 4 mg IV Q4 prn for pain.   I will get in touch with Dr. Donella Stade on Monday to consider palliative radiation to his spine.  Patient will discuss outpatient treatment for his [facility] with Dr. Grayland Ormond as an outpatient   Visit Diagnosis 1. Hematemesis with nausea   2. Back pain   3. Other acute back pain     Dr. Randa Evens, MD, MPH [facility] [phone] 01/04/2019  1:33 PM

## 2019-01-04 NOTE — Progress Notes (Signed)
Pt requested he did not want any info given to family

## 2019-01-04 NOTE — Progress Notes (Signed)
[facility] [place] NAME: Andrew Gibbs    MR#:  [id number]  DATE OF BIRTH:  1956-07-12  SUBJECTIVE:  CHIEF COMPLAINT:   Chief Complaint  Patient presents with   Back Pain   -Patient admitted with back pain-has known metastatic disease in T8, L1 L3 vertebra from hepatocellular carcinoma which has been progressively worsening -Noted to have hematemesis on admission.  Hemoglobin is stable at 8.8  REVIEW OF SYSTEMS:  Review of Systems  Constitutional: Negative for chills, fever and malaise/fatigue.  HENT: Negative for congestion, ear discharge, hearing loss and nosebleeds.   Eyes: Negative for blurred vision and double vision.  Respiratory: Negative for cough, shortness of breath and wheezing.   Cardiovascular: Negative for chest pain, palpitations and leg swelling.  Gastrointestinal: Positive for abdominal pain. Negative for constipation, diarrhea, nausea and vomiting.       Hematemesis  Genitourinary: Negative for dysuria.  Musculoskeletal: Positive for back pain and myalgias.  Neurological: Negative for dizziness, focal weakness, seizures, weakness and headaches.  Psychiatric/Behavioral: Negative for depression.    DRUG ALLERGIES:  No Known Allergies  VITALS:  Blood pressure (!) 119/57, pulse 79, temperature 98.7 F (37.1 C), temperature source Oral, resp. rate 16, height 5\' 7"  (1.702 m), weight 58 kg, SpO2 100 %.  PHYSICAL EXAMINATION:  Physical Exam   GENERAL:  64 y.o.-year-old disheveled appearing patient lying in the bed with no acute distress.  EYES: Pupils equal, round, reactive to light and accommodation. No scleral icterus. Extraocular muscles intact.  HEENT: Head atraumatic, normocephalic. Oropharynx and nasopharynx clear.  NECK:  Supple, no jugular venous distention. No thyroid enlargement, no tenderness.  LUNGS: Normal breath sounds bilaterally, no wheezing, rales,rhonchi or crepitation. No use of accessory muscles  of respiration.  Decreased bibasilar breath sound CARDIOVASCULAR: S1, S2 normal. No murmurs, rubs, or gallops.  ABDOMEN: Soft, generalized discomfort without guarding or rigidity nondistended. Bowel sounds present. No organomegaly or mass.  EXTREMITIES: No pedal edema, cyanosis, or clubbing.  NEUROLOGIC: Cranial nerves II through XII are intact. Muscle strength 5/5 in all extremities. Sensation intact. Gait not checked.  Overall weakness noted PSYCHIATRIC: The patient is alert and oriented x 3.  SKIN: No obvious rash, lesion, or ulcer.    LABORATORY PANEL:   CBC Recent Labs  Lab 01/04/19 0622  WBC 9.7  HGB 8.8*  HCT 27.5*  PLT 181   ------------------------------------------------------------------------------------------------------------------  Chemistries  Recent Labs  Lab 01/03/19 1503 01/04/19 0622  NA 137 145  K 5.4* 4.3  CL 108 118*  CO2 18* 17*  GLUCOSE 101* 113*  BUN 20 24*  CREATININE 0.52* 0.66  CALCIUM 8.3* 8.7*  AST 431*  --   ALT 97*  --   ALKPHOS 194*  --   BILITOT 2.8*  --    ------------------------------------------------------------------------------------------------------------------  Cardiac Enzymes No results for input(s): TROPONINI in the last 168 hours. ------------------------------------------------------------------------------------------------------------------  RADIOLOGY:  Dg Chest 2 View  Result Date: 01/03/2019 CLINICAL DATA:  Low back pain for 2 months. History of hepatitis C, IV drug use, liver cancer. EXAM: CHEST - 2 VIEW COMPARISON:  Chest x-rays dated 04/17/2018 and 02/13/2018. FINDINGS: The heart size and mediastinal contours are within normal limits. Both lungs are clear. The visualized skeletal structures are unremarkable. IMPRESSION: No active cardiopulmonary disease. Electronically Signed   By: Franki Cabot M.D.   On: 01/03/2019 17:41   Ct Angio Chest/abd/pel For Dissection W And/or W/wo  Result Date: 01/03/2019 CLINICAL  DATA:  Low  back and generalized abdominal pain for 2 months. EXAM: CT ANGIOGRAPHY CHEST, ABDOMEN AND PELVIS TECHNIQUE: Multidetector CT imaging through the chest, abdomen and pelvis was performed using the standard protocol during bolus administration of intravenous contrast. Multiplanar reconstructed images and MIPs were obtained and reviewed to evaluate the vascular anatomy. CONTRAST:  100 mL ISOVUE-370 IOPAMIDOL (ISOVUE-370) INJECTION 76% COMPARISON:  CT chest 10/16/2018. CT angiogram abdomen and pelvis 08/27/2018. FINDINGS: CTA CHEST FINDINGS Cardiovascular: Preferential opacification of the thoracic aorta. No evidence of thoracic aortic aneurysm or dissection. Normal heart size. No pericardial effusion. Mediastinum/Nodes: No enlarged mediastinal, hilar, or axillary lymph nodes. Thyroid gland, trachea, and esophagus demonstrate no significant findings. Lungs/Pleura: No pleural effusion. Lungs demonstrate mild dependent atelectasis. There is some emphysematous change in the apices. No nodule, mass or consolidative process. Musculoskeletal: Lytic lesions are seen and T2 and T3. Mild superior endplate compression fracture of T2 noted. Mixed lytic and sclerotic lesion is also present at T8. Paraspinous and epidural soft tissue tumor on the right are seen extending out of the T8 vertebral body. Tumor contacts the cord and extends into the right T7-8 foramen. There is a lytic lesion in the left T4 pedicle and medial margin of the left fourth rib. Review of the MIP images confirms the above findings. CTA ABDOMEN AND PELVIS FINDINGS VASCULAR Aorta: Normal caliber aorta without aneurysm, dissection, vasculitis or significant stenosis. Celiac: Patent without evidence of aneurysm, dissection, vasculitis or significant stenosis. SMA: Patent without evidence of aneurysm, dissection, vasculitis or significant stenosis. Renals: Both renal arteries are patent without evidence of aneurysm, dissection, vasculitis, fibromuscular  dysplasia or significant stenosis. Small accessory renal arteries on the right and left noted. IMA: Patent without evidence of aneurysm, dissection, vasculitis or significant stenosis. Inflow: Patent without evidence of aneurysm, dissection, vasculitis or significant stenosis. Veins: No obvious venous abnormality within the limitations of this arterial phase study. Review of the MIP images confirms the above findings. NON-VASCULAR Hepatobiliary: The liver is markedly cirrhotic. Round lesion in the liver measuring 4.0 x 4.5 cm in the right hepatic lobe consistent with the patient's history of hepatocellular carcinoma is noted. 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 Biliary tree is unremarkable. Vascular coils in the porta hepatis are identified. Pancreas: Unremarkable. No pancreatic ductal dilatation or surrounding inflammatory changes. Spleen: No focal lesion.  Splenomegaly. Adrenals/Urinary Tract: Renal cysts are noted. The kidneys otherwise appear normal. Ureters and urinary bladder are unremarkable. Adrenal glands appear normal. Stomach/Bowel: Stomach is within normal limits. Appendix appears normal. No evidence of bowel wall thickening, distention, or inflammatory changes. Lymphatic: No lymphadenopathy. Reproductive: Prostate is unremarkable. Other: No free or focal fluid collection. Musculoskeletal: There is a large lytic lesion and soft tissue mass in the posterior aspect of the right L1 vertebral body and pedicle. Tumor extends into the right L1-2 neural foramen and extends into the right epidural space. Second destructive lesion is seen on the right at L3 where epidural tumor extends into the thecal sac and right L3-4 foramen. Smaller lytic lesions in L4, L5 and pelvis are noted. Review of the MIP images confirms the above findings. IMPRESSION: Negative for aortic dissection or aneurysm. No acute abnormality chest, abdomen or pelvis. Markedly cirrhotic liver with a mass consistent with the patient's known a  hepatocellular carcinoma. 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 These findings could be better evaluated with MRI  with and without contrast. Electronically Signed   By: Inge Rise M.D.   On: 01/03/2019 21:28    EKG:   Orders placed or performed during the hospital encounter of 01/03/19   ED EKG   ED EKG   EKG 12-Lead   EKG 12-Lead    ASSESSMENT AND PLAN:   63 year old male with past medical history significant for hepatitis C, hepatocellular carcinoma, alcohol abuse, liver cirrhosis, history of IV drug abuse who had a recent upper endoscopy done for esophageal varices comes to the hospital secondary to lower back pain and noted to have an episode of hematemesis  1.  Upper GI bleed-1 episode of hematemesis.  Hemoglobin dropped from 9.8-8.8 -Known history of grade 1 esophageal varices from recent EGD 10 days ago. -No further active bleeding at this time.  GI has been consulted -Patient has remained n.p.o., however unfortunately is COVID test is still pending this morning-patient endoscopy has been postponed until the test results will be negative. -Started on clear liquids and will keep him n.p.o. after midnight. -Continue to monitor hemoglobin at this time. -Patient also has a history of NSAID use -Currently on octreotide drip and IV Protonix  2.  Acute on chronic low back pain-patient with metastatic hepatocellular carcinoma, CT of the abdomen showing T8, L1 L3 destruction of bone and epidural tumor extending into the neural foramina. -After the GI bleed is addressed, will consider MRI and talk to orthopedics. -Pain medications for now.  Poor candidate for any intervention also  3.  Hepatocellular carcinoma-based on MRI of the abdomen in October 2019 showing 4 cm liver mass.  Not a candidate for liver transplant.   Underwent ablation in April with some improvement in abdominal pain. Pre-ablation AFP fevers greater than 33,000.  Post ablation AFP is still at [place] -Patient is not currently on systemic therapy -Has metastatic disease as well to his vertebrae.  Will await oncology input about overall prognosis and disease course.  4.  Polysubstance abuse-counseled.  Also has alcohol abuse history.  Not recently.  Urine tox screen is negative.  5.  DVT prophylaxis-teds and SCDs only    All the records are reviewed and case discussed with Care Management/Social Workerr. Management plans discussed with the patient, family and they are in agreement.  CODE STATUS: Full code  TOTAL TIME TAKING CARE OF THIS PATIENT: 38 minutes.   POSSIBLE D/C IN 1-2 DAYS, DEPENDING ON CLINICAL CONDITION.   Gladstone Lighter M.D on 01/04/2019 at 1:23 PM  Between 7am to 6pm - Pager - [phone]  After 6pm go to [email or web address] - password [email or web address] [facility]  Office  [phone]  CC: Primary care physician; Center, [facility]

## 2019-01-04 NOTE — Consult Note (Signed)
Lucilla Lame, MD [facility]  [address] [place], [place] [address] Phone: [phone] Fax : [phone]  Consultation  Referring Provider:     Mayer Camel CRNP  Primary Care Physician:  Center, [facility] Primary Gastroenterologist:  Dr. Vicente Males         Reason for Consultation:     Upper GI bleed  Date of Admission:  01/03/2019 Date of Consultation:  01/04/2019         HPI:   Andrew Gibbs is a 63 y.o. male who has a history of hepatocellular carcinoma IV drug abuse hepatitis C thrombocytopenia EtOH abuse and esophageal varices who came into the emergency room yesterday with back pain.  The patient states he was taking Motrin for the back pain.  The patient then had a episode of vomiting in the ER.  The vomitus was reported as a large amount of dark red blood with clots and he reported some abdominal pain at that time.  The patient denies that he had not had any similar episodes of vomiting recently.  The patient had an EGD earlier this month that showed:  Impression: - Normal examined duodenum. - Portal hypertensive gastropathy.  - Grade I esophageal varices.  That time the procedure was being done for melena.  Patient's INR on admission was 1.3 with a hemoglobin of 8.8 with the hemoglobin of 9.8 yesterday and a hemoglobin of 13.2 in April.  The patient denies any abdominal pain today and states that his back pain is what is really bothering him.  The patient also had a drug test sent off that was negative.  Past Medical History:  Diagnosis Date  . Blood dyscrasia    thrombocytopenia  . Hepatitis C   . Heroin use     Past Surgical History:  Procedure Laterality Date  . ESOPHAGOGASTRODUODENOSCOPY (EGD) WITH PROPOFOL N/A 12/24/2018   Procedure: ESOPHAGOGASTRODUODENOSCOPY (EGD) WITH PROPOFOL;  Surgeon: Jonathon Bellows, MD;  Location[facility] ENDOSCOPY;  Service: Gastroenterology;  Laterality: N/A;  . IR ANGIOGRAM SELECTIVE EACH ADDITIONAL VESSEL  10/17/2018  . IR  ANGIOGRAM SELECTIVE EACH ADDITIONAL VESSEL  10/17/2018  . IR ANGIOGRAM SELECTIVE EACH ADDITIONAL VESSEL  10/17/2018  . IR ANGIOGRAM SELECTIVE EACH ADDITIONAL VESSEL  10/17/2018  . IR ANGIOGRAM SELECTIVE EACH ADDITIONAL VESSEL  10/17/2018  . IR ANGIOGRAM SELECTIVE EACH ADDITIONAL VESSEL  10/17/2018  . IR ANGIOGRAM SELECTIVE EACH ADDITIONAL VESSEL  10/17/2018  . IR ANGIOGRAM SELECTIVE EACH ADDITIONAL VESSEL  11/05/2018  . IR ANGIOGRAM SELECTIVE EACH ADDITIONAL VESSEL  11/05/2018  . IR ANGIOGRAM SELECTIVE EACH ADDITIONAL VESSEL  11/05/2018  . IR ANGIOGRAM VISCERAL SELECTIVE  10/17/2018  . IR ANGIOGRAM VISCERAL SELECTIVE  10/17/2018  . IR ANGIOGRAM VISCERAL SELECTIVE  11/05/2018  . IR EMBO ARTERIAL NOT HEMORR HEMANG INC GUIDE ROADMAPPING  10/17/2018  . IR EMBO TUMOR ORGAN ISCHEMIA INFARCT INC GUIDE ROADMAPPING  11/05/2018  . IR RADIOLOGIST EVAL & MGMT  05/22/2018  . IR RADIOLOGIST EVAL & MGMT  08/27/2018  . IR US GUIDE VASC ACCESS RIGHT  10/17/2018  . IR US GUIDE VASC ACCESS RIGHT  11/05/2018  . KNEE SURGERY    . TEE WITHOUT CARDIOVERSION N/A 04/22/2018   Procedure: TRANSESOPHAGEAL ECHOCARDIOGRAM (TEE);  Surgeon: Teodoro Spray, MD;  Location: [facility] ORS;  Service: Cardiovascular;  Laterality: N/A;    Prior to Admission medications   Medication Sig Start Date End Date Taking? Authorizing Provider  cyclobenzaprine (FLEXERIL) 10 MG tablet Take 1 tablet (10 mg total)  by mouth 3 (three) times daily as needed. 12/02/18  Yes Fisher, Linden Dolin, PA-C  Multiple Vitamin (MULTIVITAMIN WITH MINERALS) TABS tablet Take 1 tablet by mouth daily. 05/03/18  Yes Epifanio Lesches, MD  nadolol (CORGARD) 20 MG tablet Take 20 mg by mouth daily. 12/25/18  Yes [provider]  pantoprazole (PROTONIX) 40 MG tablet Take 1 tablet (40 mg total) by mouth daily. 11/05/18  Yes Corrie Mckusick, DO  traMADol (ULTRAM) 50 MG tablet Take 1 tablet (50 mg total) by mouth every 6 (six) hours as needed. 12/19/18  Yes Lloyd Huger, MD    Family  History  Problem Relation Age of Onset  . Multiple myeloma Mother      Social History   Tobacco Use  . Smoking status: Current Every Day Smoker    Packs/day: 0.50    Years: 40.00    Pack years: 20.00  . Smokeless tobacco: Never Used  Substance Use Topics  . Alcohol use: Not Currently    Comment: 40 oz today  . Drug use: Not Currently    Types: IV, Cocaine, Heroin, Marijuana    Comment: heroin, states he used to use cocaine    Allergies as of 01/03/2019  . (No Known Allergies)    Review of Systems:    All systems reviewed and negative except where noted in HPI.   Physical Exam:  Vital signs in last 24 hours: Temp:  [98.6 F (37 C)-98.7 F (37.1 C)] 98.6 F (37 C) (06/20 0040) Pulse Rate:  [75-94] 82 (06/20 0040) Resp:  [16-30] 27 (06/19 2200) BP: (94-129)/(57-97) 126/97 (06/20 0040) SpO2:  [96 %-100 %] 100 % (06/20 0040) Weight:  [58 kg] 58 kg (06/19 1519) Last BM Date: 01/04/19 General:   Pleasant, cooperative in NAD Head:  Normocephalic and atraumatic. Eyes:   No icterus.   Conjunctiva pink. PERRLA. Ears:  Normal auditory acuity. Neck:  Supple; no masses or thyroidomegaly Lungs: Respirations even and unlabored. Lungs clear to auscultation bilaterally.   No wheezes, crackles, or rhonchi.  Heart:  Regular rate and rhythm;  Without murmur, clicks, rubs or gallops Abdomen:  Soft, nondistended, nontender. Normal bowel sounds. No appreciable masses or hepatomegaly.  No rebound or guarding.  Rectal:  Not performed. Msk:  Symmetrical without gross deformities.    Extremities:  Without edema, cyanosis or clubbing. Neurologic:  Alert and oriented x3;  grossly normal neurologically. Skin:  Intact without significant lesions or rashes. Cervical Nodes:  No significant cervical adenopathy. Psych:  Alert and cooperative. Normal affect.  LAB RESULTS: Recent Labs    01/03/19 1503 01/04/19 0622  WBC 11.8* 9.7  HGB 9.8* 8.8*  HCT 30.1* 27.5*  PLT 171 181   BMET Recent  Labs    01/03/19 1503 01/04/19 0622  NA 137 145  K 5.4* 4.3  CL 108 118*  CO2 18* 17*  GLUCOSE 101* 113*  BUN 20 24*  CREATININE 0.52* 0.66  CALCIUM 8.3* 8.7*   LFT Recent Labs    01/03/19 1503  PROT 7.0  ALBUMIN 2.0*  AST 431*  ALT 97*  ALKPHOS 194*  BILITOT 2.8*   PT/INR Recent Labs    01/04/19 0622  LABPROT 16.3*  INR 1.3*    STUDIES: Dg Chest 2 View  Result Date: 01/03/2019 CLINICAL DATA:  Low back pain for 2 months. History of hepatitis C, IV drug use, liver cancer. EXAM: CHEST - 2 VIEW COMPARISON:  Chest x-rays dated 04/17/2018 and 02/13/2018. FINDINGS: The heart size and mediastinal contours  are within normal limits. Both lungs are clear. The visualized skeletal structures are unremarkable. IMPRESSION: No active cardiopulmonary disease. Electronically Signed   By: Franki Cabot M.D.   On: 01/03/2019 17:41   Ct Angio Chest/abd/pel For Dissection W And/or W/wo  Result Date: 01/03/2019 CLINICAL DATA:  Low back and generalized abdominal pain for 2 months. EXAM: CT ANGIOGRAPHY CHEST, ABDOMEN AND PELVIS TECHNIQUE: Multidetector CT imaging through the chest, abdomen and pelvis was performed using the standard protocol during bolus administration of intravenous contrast. Multiplanar reconstructed images and MIPs were obtained and reviewed to evaluate the vascular anatomy. CONTRAST:  100 mL ISOVUE-370 IOPAMIDOL (ISOVUE-370) INJECTION 76% COMPARISON:  CT chest 10/16/2018. CT angiogram abdomen and pelvis 08/27/2018. FINDINGS: CTA CHEST FINDINGS Cardiovascular: Preferential opacification of the thoracic aorta. No evidence of thoracic aortic aneurysm or dissection. Normal heart size. No pericardial effusion. Mediastinum/Nodes: No enlarged mediastinal, hilar, or axillary lymph nodes. Thyroid gland, trachea, and esophagus demonstrate no significant findings. Lungs/Pleura: No pleural effusion. Lungs demonstrate mild dependent atelectasis. There is some emphysematous change in the  apices. No nodule, mass or consolidative process. Musculoskeletal: Lytic lesions are seen and T2 and T3. Mild superior endplate compression fracture of T2 noted. Mixed lytic and sclerotic lesion is also present at T8. Paraspinous and epidural soft tissue tumor on the right are seen extending out of the T8 vertebral body. Tumor contacts the cord and extends into the right T7-8 foramen. There is a lytic lesion in the left T4 pedicle and medial margin of the left fourth rib. Review of the MIP images confirms the above findings. CTA ABDOMEN AND PELVIS FINDINGS VASCULAR Aorta: Normal caliber aorta without aneurysm, dissection, vasculitis or significant stenosis. Celiac: Patent without evidence of aneurysm, dissection, vasculitis or significant stenosis. SMA: Patent without evidence of aneurysm, dissection, vasculitis or significant stenosis. Renals: Both renal arteries are patent without evidence of aneurysm, dissection, vasculitis, fibromuscular dysplasia or significant stenosis. Small accessory renal arteries on the right and left noted. IMA: Patent without evidence of aneurysm, dissection, vasculitis or significant stenosis. Inflow: Patent without evidence of aneurysm, dissection, vasculitis or significant stenosis. Veins: No obvious venous abnormality within the limitations of this arterial phase study. Review of the MIP images confirms the above findings. NON-VASCULAR Hepatobiliary: The liver is markedly cirrhotic. Round lesion in the liver measuring 4.0 x 4.5 cm in the right hepatic lobe consistent with the patient's history of hepatocellular carcinoma is noted. 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 Biliary tree is unremarkable. Vascular coils in the porta hepatis are identified. Pancreas: Unremarkable. No pancreatic ductal dilatation or surrounding inflammatory changes. Spleen: No focal lesion.  Splenomegaly. Adrenals/Urinary Tract: Renal cysts are noted. The kidneys otherwise appear normal. Ureters and urinary  bladder are unremarkable. Adrenal glands appear normal. Stomach/Bowel: Stomach is within normal limits. Appendix appears normal. No evidence of bowel wall thickening, distention, or inflammatory changes. Lymphatic: No lymphadenopathy. Reproductive: Prostate is unremarkable. Other: No free or focal fluid collection. Musculoskeletal: There is a large lytic lesion and soft tissue mass in the posterior aspect of the right L1 vertebral body and pedicle. Tumor extends into the right L1-2 neural foramen and extends into the right epidural space. Second destructive lesion is seen on the right at L3 where epidural tumor extends into the thecal sac and right L3-4 foramen. Smaller lytic lesions in L4, L5 and pelvis are noted. Review of the MIP images confirms the above findings. IMPRESSION: Negative for aortic dissection or aneurysm. No acute abnormality chest, abdomen or pelvis. Markedly cirrhotic liver with a mass  consistent with the patient's known a hepatocellular carcinoma. 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 These findings could be better evaluated with MRI with and without contrast. Electronically Signed   By: Inge Rise M.D.   On: 01/03/2019 21:28      Impression / Plan:   Assessment: Active Problems:   GI bleed   Andrew Gibbs is a 63 y.o. y/o male with hepatocellular carcinoma hepatitis C cirrhosis portal hypertensive gastropathy with esophageal varices and bone mets.  The patient has been taking Motrin for his pain and had an episode of hematemesis in the ER.  The patient has had no further signs of bleeding since admission but has dropped his hemoglobin by 1 g.  Despite being admitted with hematemesis the patient did not have a COVID 19 test in anticipation of any endoscopic procedures.  Plan:  The plan for the patient would be  to get a rapid COVID screening test to see if he has negative.  If he is negative then we can proceed with an endoscopy electively.  If he should start to have significant bleeding prior to the results then he may need a urgent procedure without the results of the COVID 19 test.  As long as the patient is stable the procedure can be done prior to discharge either tomorrow or Monday.  I have discussed the case with Dr. Tressia Miners.  Thank you for involving me in the care of this patient.      LOS: 1 day   Lucilla Lame, MD  01/04/2019, 10:23 AM    Note: This dictation was prepared with Dragon dictation along with smaller phrase technology. Any transcriptional errors that result from this process are unintentional.

## 2019-01-05 ENCOUNTER — Inpatient Hospital Stay: Payer: Medicare Other

## 2019-01-05 LAB — HEMOGLOBIN AND HEMATOCRIT, BLOOD
HCT: 21.6 % — ABNORMAL LOW (ref 39.0–52.0)
HCT: 24.1 % — ABNORMAL LOW (ref 39.0–52.0)
Hemoglobin: 6.8 g/dL — ABNORMAL LOW (ref 13.0–17.0)
Hemoglobin: 7.8 g/dL — ABNORMAL LOW (ref 13.0–17.0)

## 2019-01-05 MED ORDER — SODIUM CHLORIDE 0.9 % IV SOLN
2.0000 g | Freq: Every day | INTRAVENOUS | Status: DC
  Administered 2019-01-05 – 2019-01-13 (×9): 2 g via INTRAVENOUS
  Filled 2019-01-05: qty 20
  Filled 2019-01-05: qty 2
  Filled 2019-01-05: qty 20
  Filled 2019-01-05: qty 2
  Filled 2019-01-05 (×2): qty 20
  Filled 2019-01-05 (×3): qty 2
  Filled 2019-01-05: qty 20

## 2019-01-05 MED ORDER — GADOBUTROL 1 MMOL/ML IV SOLN
5.0000 mL | Freq: Once | INTRAVENOUS | Status: AC | PRN
  Administered 2019-01-05: 5 mL via INTRAVENOUS

## 2019-01-05 MED ORDER — SODIUM CHLORIDE 0.9% IV SOLUTION
Freq: Once | INTRAVENOUS | Status: AC
  Administered 2019-01-05: 12:00:00 via INTRAVENOUS

## 2019-01-05 NOTE — TOC Initial Note (Signed)
Transition of Care [facility]) - Initial/Assessment Note    Patient Details  Name: Andrew Gibbs MRN: [id number] Date of Birth: 07-09-1956  Transition [facility]) CM/SW Contact:    Latanya Maudlin, RN Phone Number: 01/05/2019, 8:52 AM  Clinical Narrative:  TOC consulted to complete high risk readmission assessment. Patient currently lives alone but his sister and brother visit often. Siblings provide most of the patients transport. Patient has private duty aide who comes in three days a week to assist with ADL's and household tasks. Patient requires the use of no DME. Patient has hx of drug and alcohol abuse. He states he has plenty of resources and is not interested in any more. Barrier to discharge is that the patient is still actively bleeding. Follows at the [place] [facility] for PCP services. Uses [facility] and obtains medications without issues                  Expected Discharge Plan: Home/Self Care Barriers to Discharge: Continued Medical Work up   Patient Goals and CMS Choice Patient states their goals for this hospitalization and ongoing recovery are:: to get the hell out of here, yall aint even letting me drink nothing CMS Medicare.gov Compare Post Acute Care list provided to:: Patient    Expected Discharge Plan and Services Expected Discharge Plan: Home/Self Care                                              Prior Living Arrangements/Services     Patient language and need for interpreter reviewed:: Yes Do you feel safe going back to the place where you live?: Yes      Need for Family Participation in Patient Care: No (Comment) Care giver support system in place?: Yes (comment)(family, PCS) Current home services: DME, Other (comment)(private duty aide) Criminal Activity/Legal Involvement Pertinent to Current Situation/Hospitalization: No - Comment as needed  Activities of Daily Living Home Assistive Devices/Equipment: Cane (specify quad or straight) ADL  Screening (condition at time of admission) Patient's cognitive ability adequate to safely complete daily activities?: No Is the patient deaf or have difficulty hearing?: No Does the patient have difficulty seeing, even when wearing glasses/contacts?: No Does the patient have difficulty concentrating, remembering, or making decisions?: Yes Patient able to express need for assistance with ADLs?: Yes Does the patient have difficulty dressing or bathing?: Yes Independently performs ADLs?: Yes (appropriate for developmental age) Does the patient have difficulty walking or climbing stairs?: No Weakness of Legs: Both Weakness of Arms/Hands: Both  Permission Sought/Granted Permission sought to share information with : Case Manager, Customer service manager                Emotional Assessment Appearance:: Disheveled Attitude/Demeanor/Rapport: Avoidant Affect (typically observed): Agitated Orientation: : Oriented to Self, Oriented to Place, Oriented to  Time, Oriented to Situation Alcohol / Substance Use: Alcohol Use, Illicit Drugs    Admission diagnosis:  Back pain [M54.9] Hematemesis with nausea [K92.0] Other acute back pain [M54.9] Patient Active Problem List   Diagnosis Date Noted  . Hematemesis with nausea   . GI bleed 01/03/2019  . Candidemia ([place]) 07/23/2018  . Discitis of cervical region 07/23/2018  . 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 Hepatocellular carcinoma ([place]) 05/17/2018  . Malnutrition of moderate degree 04/28/2018  . Abscess of right arm 04/17/2018   PCP:  [facility], [place]  [facility]:   [facility] [address] [place] [address] [place])  [id number] Phone: [phone] Fax: [phone]     Social Determinants of Health (SDOH) Interventions    Readmission Risk Interventions Readmission Risk Prevention Plan 01/05/2019  Transportation Screening Complete  PCP or  Specialist Appt within 3-5 Days Not Complete  HRI or [place] Patient refused  Social Work Consult for [place] Planning/Counseling Patient refused  Palliative Care Screening Not Applicable  Medication Review [employer]) Complete  Some recent data might be hidden

## 2019-01-05 NOTE — Progress Notes (Signed)
[facility] [place] NAME: Andrew Gibbs    MR#:  [id number]  DATE OF BIRTH:  December 19, 1955  SUBJECTIVE:  CHIEF COMPLAINT:   Chief Complaint  Patient presents with   Back Pain   -Still complains of lower back pain. -Denies any hematemesis or melena.  Hemoglobin dropped to 7.3 -EGD in a.m.  REVIEW OF SYSTEMS:  Review of Systems  Constitutional: Negative for chills, fever and malaise/fatigue.  HENT: Negative for congestion, ear discharge, hearing loss and nosebleeds.   Eyes: Negative for blurred vision and double vision.  Respiratory: Negative for cough, shortness of breath and wheezing.   Cardiovascular: Negative for chest pain, palpitations and leg swelling.  Gastrointestinal: Positive for abdominal pain. Negative for constipation, diarrhea, nausea and vomiting.       Hematemesis  Genitourinary: Negative for dysuria.  Musculoskeletal: Positive for back pain and myalgias.  Neurological: Negative for dizziness, focal weakness, seizures, weakness and headaches.  Psychiatric/Behavioral: Negative for depression.    DRUG ALLERGIES:  No Known Allergies  VITALS:  Blood pressure (!) 109/56, pulse 74, temperature 99.3 F (37.4 C), temperature source Oral, resp. rate 16, height 5\' 7"  (1.702 m), weight 58 kg, SpO2 100 %.  PHYSICAL EXAMINATION:  Physical Exam   GENERAL:  63 y.o.-year-old disheveled appearing patient lying in the bed with no acute distress.  EYES: Pupils equal, round, reactive to light and accommodation. No scleral icterus. Extraocular muscles intact.  HEENT: Head atraumatic, normocephalic. Oropharynx and nasopharynx clear.  NECK:  Supple, no jugular venous distention. No thyroid enlargement, no tenderness.  LUNGS: Normal breath sounds bilaterally, no wheezing, rales,rhonchi or crepitation. No use of accessory muscles of respiration.  Decreased bibasilar breath sound CARDIOVASCULAR: S1, S2 normal. No murmurs, rubs, or  gallops.  ABDOMEN: Soft, generalized discomfort without guarding or rigidity nondistended. Bowel sounds present. No organomegaly or mass.  EXTREMITIES: No pedal edema, cyanosis, or clubbing.  NEUROLOGIC: Cranial nerves II through XII are intact. Muscle strength 5/5 in all extremities. Sensation intact. Gait not checked.  Overall weakness noted PSYCHIATRIC: The patient is alert and oriented x 3.  SKIN: No obvious rash, lesion, or ulcer.    LABORATORY PANEL:   CBC Recent Labs  Lab 01/04/19 0622  01/04/19 2333  WBC 9.7  --   --   HGB 8.8*   < > 7.3*  HCT 27.5*   < > 22.4*  PLT 181  --   --    < > = values in this interval not displayed.   ------------------------------------------------------------------------------------------------------------------  Chemistries  Recent Labs  Lab 01/03/19 1503 01/04/19 0622  NA 137 145  K 5.4* 4.3  CL 108 118*  CO2 18* 17*  GLUCOSE 101* 113*  BUN 20 24*  CREATININE 0.52* 0.66  CALCIUM 8.3* 8.7*  AST 431*  --   ALT 97*  --   ALKPHOS 194*  --   BILITOT 2.8*  --    ------------------------------------------------------------------------------------------------------------------  Cardiac Enzymes No results for input(s): TROPONINI in the last 168 hours. ------------------------------------------------------------------------------------------------------------------  RADIOLOGY:  Dg Chest 2 View  Result Date: 01/03/2019 CLINICAL DATA:  Low back pain for 2 months. History of hepatitis C, IV drug use, liver cancer. EXAM: CHEST - 2 VIEW COMPARISON:  Chest x-rays dated 04/17/2018 and 02/13/2018. FINDINGS: The heart size and mediastinal contours are within normal limits. Both lungs are clear. The visualized skeletal structures are unremarkable. IMPRESSION: No active cardiopulmonary disease. Electronically Signed   By: Roxy Horseman.D.  On: 01/03/2019 17:41   Ct Angio Chest/abd/pel For Dissection W And/or W/wo  Result Date:  01/03/2019 CLINICAL DATA:  Low back and generalized abdominal pain for 2 months. EXAM: CT ANGIOGRAPHY CHEST, ABDOMEN AND PELVIS TECHNIQUE: Multidetector CT imaging through the chest, abdomen and pelvis was performed using the standard protocol during bolus administration of intravenous contrast. Multiplanar reconstructed images and MIPs were obtained and reviewed to evaluate the vascular anatomy. CONTRAST:  100 mL ISOVUE-370 IOPAMIDOL (ISOVUE-370) INJECTION 76% COMPARISON:  CT chest 10/16/2018. CT angiogram abdomen and pelvis 08/27/2018. FINDINGS: CTA CHEST FINDINGS Cardiovascular: Preferential opacification of the thoracic aorta. No evidence of thoracic aortic aneurysm or dissection. Normal heart size. No pericardial effusion. Mediastinum/Nodes: No enlarged mediastinal, hilar, or axillary lymph nodes. Thyroid gland, trachea, and esophagus demonstrate no significant findings. Lungs/Pleura: No pleural effusion. Lungs demonstrate mild dependent atelectasis. There is some emphysematous change in the apices. No nodule, mass or consolidative process. Musculoskeletal: Lytic lesions are seen and T2 and T3. Mild superior endplate compression fracture of T2 noted. Mixed lytic and sclerotic lesion is also present at T8. Paraspinous and epidural soft tissue tumor on the right are seen extending out of the T8 vertebral body. Tumor contacts the cord and extends into the right T7-8 foramen. There is a lytic lesion in the left T4 pedicle and medial margin of the left fourth rib. Review of the MIP images confirms the above findings. CTA ABDOMEN AND PELVIS FINDINGS VASCULAR Aorta: Normal caliber aorta without aneurysm, dissection, vasculitis or significant stenosis. Celiac: Patent without evidence of aneurysm, dissection, vasculitis or significant stenosis. SMA: Patent without evidence of aneurysm, dissection, vasculitis or significant stenosis. Renals: Both renal arteries are patent without evidence of aneurysm, dissection,  vasculitis, fibromuscular dysplasia or significant stenosis. Small accessory renal arteries on the right and left noted. IMA: Patent without evidence of aneurysm, dissection, vasculitis or significant stenosis. Inflow: Patent without evidence of aneurysm, dissection, vasculitis or significant stenosis. Veins: No obvious venous abnormality within the limitations of this arterial phase study. Review of the MIP images confirms the above findings. NON-VASCULAR Hepatobiliary: The liver is markedly cirrhotic. Round lesion in the liver measuring 4.0 x 4.5 cm in the right hepatic lobe consistent with the patient's history of hepatocellular carcinoma is noted. 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 Biliary tree is unremarkable. Vascular coils in the porta hepatis are identified. Pancreas: Unremarkable. No pancreatic ductal dilatation or surrounding inflammatory changes. Spleen: No focal lesion.  Splenomegaly. Adrenals/Urinary Tract: Renal cysts are noted. The kidneys otherwise appear normal. Ureters and urinary bladder are unremarkable. Adrenal glands appear normal. Stomach/Bowel: Stomach is within normal limits. Appendix appears normal. No evidence of bowel wall thickening, distention, or inflammatory changes. Lymphatic: No lymphadenopathy. Reproductive: Prostate is unremarkable. Other: No free or focal fluid collection. Musculoskeletal: There is a large lytic lesion and soft tissue mass in the posterior aspect of the right L1 vertebral body and pedicle. Tumor extends into the right L1-2 neural foramen and extends into the right epidural space. Second destructive lesion is seen on the right at L3 where epidural tumor extends into the thecal sac and right L3-4 foramen. Smaller lytic lesions in L4, L5 and pelvis are noted. Review of the MIP images confirms the above findings. IMPRESSION: Negative for aortic dissection or aneurysm. No acute abnormality chest, abdomen or pelvis. Markedly cirrhotic liver with a mass consistent  with the patient's known a hepatocellular carcinoma. Osseous metastatic disease has worsened and is most notable at T8, L1 and L3 where there is destruction of bone and  epidural tumor as well as tumor extending into the neural foramina as described above. These findings could be better evaluated with MRI with and without contrast. Electronically Signed   By: Inge Rise M.D.   On: 01/03/2019 21:28    EKG:   Orders placed or performed during the hospital encounter of 01/03/19   ED EKG   ED EKG   EKG 12-Lead   EKG 12-Lead    ASSESSMENT AND PLAN:   63 year old male with past medical history significant for hepatitis C, hepatocellular carcinoma, alcohol abuse, liver cirrhosis, history of IV drug abuse who had a recent upper endoscopy done for esophageal varices comes to the hospital secondary to lower back pain and noted to have an episode of hematemesis  1.  Upper GI bleed-had hematemesis on admission.  Hemoglobin now dropping, at 7.3 this morning. -1 unit of packed RBC transfusion ordered today -Known history of grade 1 esophageal varices from recent EGD 10 days ago. -No further active bleeding at this time.  GI has been consulted -Patient is n.p.o., EGD in a.m. -Continue to monitor hemoglobin at this time. -Patient also has a history of NSAID use -Currently on octreotide drip and IV Protonix  2.  Acute on chronic low back pain-patient with metastatic hepatocellular carcinoma, CT of the abdomen showing T8, L1 L3 destruction of bone and epidural tumor extending into the neural foramina. -We will get MRI of the C-spine, T-spine and L-spine as recommended by oncology. -Pain medications for now.  Started on IV Decadron. -Radiation oncology consult tomorrow  3.  Hepatocellular carcinoma-based on MRI of the abdomen in October 2019 showing 4 cm liver mass.  Not a candidate for liver transplant.  Underwent ablation in April with some improvement in abdominal pain. Pre-ablation AFP  fevers greater than 33,000.  Post ablation AFP is still at Hundred -Patient is not currently on systemic therapy -Has metastatic disease as well to his vertebrae.  -Await oncology input.  Patient had a CT of the abdomen done on admission  4.  Polysubstance abuse-counseled.  Also has alcohol abuse history.  Not recently.  Urine tox screen is negative.  5.  DVT prophylaxis-teds and SCDs only    All the records are reviewed and case discussed with Care Management/Social Workerr. Management plans discussed with the patient, family and they are in agreement.  CODE STATUS: Full code  TOTAL TIME TAKING CARE OF THIS PATIENT: 38 minutes.   POSSIBLE D/C IN 1-2 DAYS, DEPENDING ON CLINICAL CONDITION.   Gladstone Lighter M.D on 01/05/2019 at 10:34 AM  Between 7am to 6pm - Pager - [phone]  After 6pm go to [email or web address] - password [email or web address] [place] [facility]  Office  [phone]  CC: Primary care physician; Center, [facility]

## 2019-01-05 NOTE — Progress Notes (Signed)
Andrew Lame, MD [facility]   [address], [place], [place] [address] Phone: [phone] Fax : [phone]   Subjective: This patient came in with back pain and a history of hepatocellular carcinoma with cirrhosis.  The patient had a drop in his hemoglobin and hematemesis in the ER.  The patient has had no further vomiting and denies any black stools.  The patient's COVID-19 test came back negative yesterday.  The patient's hemoglobin yesterday morning was 8.8 and by the evening was 7.3 despite no overt sign of bleeding.   Objective: Vital signs in last 24 hours: Vitals:   01/04/19 0040 01/04/19 1245 01/04/19 2057 01/05/19 0512  BP: (!) 126/97 (!) 119/57 133/65 (!) 109/56  Pulse: 82 79 72 74  Resp:  16 18 16   Temp: 98.6 F (37 C) 98.7 F (37.1 C) 99 F (37.2 C) 99.3 F (37.4 C)  TempSrc: Oral Oral Oral Oral  SpO2: 100% 100% 100% 100%  Weight:      Height:       Weight change:   Intake/Output Summary (Last 24 hours) at 01/05/2019 1108 Last data filed at 01/05/2019 0110 Gross per 24 hour  Intake 2049.41 ml  Output 425 ml  Net 1624.41 ml     Exam: Heart:: Regular rate and rhythm, S1S2 present or without murmur or extra heart sounds Lungs: normal and clear to auscultation and percussion Abdomen: soft, nontender, normal bowel sounds   Lab Results: @LABTEST2 @ Micro Results: Recent Results (from the past 240 hour(s))  SARS Coronavirus 2 (CEPHEID - Performed in [place] hospital lab), Hosp Order     Status: None   Collection Time: 01/04/19 10:27 AM   Specimen: 1-NP & 1-OP swab, 1-sputum (productive cough); Nasopharyngeal  Result Value Ref Range Status   SARS Coronavirus 2 NEGATIVE NEGATIVE Final    Comment: (NOTE) If result is NEGATIVE SARS-CoV-2 target nucleic acids are NOT DETECTED. The SARS-CoV-2 RNA is generally detectable in upper and lower  respiratory specimens during the acute phase of infection. The lowest  concentration of SARS-CoV-2 viral copies  this assay can detect is 250  copies / mL. A negative result does not preclude SARS-CoV-2 infection  and should not be used as the sole basis for treatment or other  patient management decisions.  A negative result may occur with  improper specimen collection / handling, submission of specimen other  than nasopharyngeal swab, presence of viral mutation(s) within the  areas targeted by this assay, and inadequate number of viral copies  (<250 copies / mL). A negative result must be combined with clinical  observations, patient history, and epidemiological information. If result is POSITIVE SARS-CoV-2 target nucleic acids are DETECTED. The SARS-CoV-2 RNA is generally detectable in upper and lower  respiratory specimens dur ing the acute phase of infection.  Positive  results are indicative of active infection with SARS-CoV-2.  Clinical  correlation with patient history and other diagnostic information is  necessary to determine patient infection status.  Positive results do  not rule out bacterial infection or co-infection with other viruses. If result is PRESUMPTIVE POSTIVE SARS-CoV-2 nucleic acids MAY BE PRESENT.   A presumptive positive result was obtained on the submitted specimen  and confirmed on repeat testing.  While 2019 novel coronavirus  (SARS-CoV-2) nucleic acids may be present in the submitted sample  additional confirmatory testing may be necessary for epidemiological  and / or clinical management purposes  to differentiate between  SARS-CoV-2 and other Sarbecovirus currently known to infect humans.  If clinically indicated additional testing with an alternate test  methodology [phone]) is advised. The SARS-CoV-2 RNA is generally  detectable in upper and lower respiratory sp ecimens during the acute  phase of infection. The expected result is Negative. Fact Sheet for Patients:  StrictlyIdeas.no Fact Sheet for Healthcare Providers:  BankingDealers.co.za This test is not yet approved or cleared by the [place] FDA and has been authorized for detection and/or diagnosis of SARS-CoV-2 by FDA under an Emergency Use Authorization (EUA).  This EUA will remain in effect (meaning this test can be used) for the duration of the COVID-19 declaration under Section 564(b)(1) of the Act, 21 U.S.C. section 360bbb-3(b)(1), unless the authorization is terminated or revoked sooner. Performed at [facility], [address] [place], [place] [address]    Studies/Results: Dg Chest 2 View  Result Date: 01/03/2019 CLINICAL DATA:  Low back pain for 2 months. History of hepatitis C, IV drug use, liver cancer. EXAM: CHEST - 2 VIEW COMPARISON:  Chest x-rays dated 04/17/2018 and 02/13/2018. FINDINGS: The heart size and mediastinal contours are within normal limits. Both lungs are clear. The visualized skeletal structures are unremarkable. IMPRESSION: No active cardiopulmonary disease. Electronically Signed   By: Franki Cabot M.D.   On: 01/03/2019 17:41   Ct Angio Chest/abd/pel For Dissection W And/or W/wo  Result Date: 01/03/2019 CLINICAL DATA:  Low back and generalized abdominal pain for 2 months. EXAM: CT ANGIOGRAPHY CHEST, ABDOMEN AND PELVIS TECHNIQUE: Multidetector CT imaging through the chest, abdomen and pelvis was performed using the standard protocol during bolus administration of intravenous contrast. Multiplanar reconstructed images and MIPs were obtained and reviewed to evaluate the vascular anatomy. CONTRAST:  100 mL ISOVUE-370 IOPAMIDOL (ISOVUE-370) INJECTION 76% COMPARISON:  CT chest 10/16/2018. CT angiogram abdomen and pelvis 08/27/2018. FINDINGS: CTA CHEST FINDINGS Cardiovascular: Preferential opacification of the thoracic aorta. No evidence of thoracic aortic aneurysm or dissection. Normal heart size. No pericardial effusion. Mediastinum/Nodes: No enlarged mediastinal, hilar, or axillary lymph  nodes. Thyroid gland, trachea, and esophagus demonstrate no significant findings. Lungs/Pleura: No pleural effusion. Lungs demonstrate mild dependent atelectasis. There is some emphysematous change in the apices. No nodule, mass or consolidative process. Musculoskeletal: Lytic lesions are seen and T2 and T3. Mild superior endplate compression fracture of T2 noted. Mixed lytic and sclerotic lesion is also present at T8. Paraspinous and epidural soft tissue tumor on the right are seen extending out of the T8 vertebral body. Tumor contacts the cord and extends into the right T7-8 foramen. There is a lytic lesion in the left T4 pedicle and medial margin of the left fourth rib. Review of the MIP images confirms the above findings. CTA ABDOMEN AND PELVIS FINDINGS VASCULAR Aorta: Normal caliber aorta without aneurysm, dissection, vasculitis or significant stenosis. Celiac: Patent without evidence of aneurysm, dissection, vasculitis or significant stenosis. SMA: Patent without evidence of aneurysm, dissection, vasculitis or significant stenosis. Renals: Both renal arteries are patent without evidence of aneurysm, dissection, vasculitis, fibromuscular dysplasia or significant stenosis. Small accessory renal arteries on the right and left noted. IMA: Patent without evidence of aneurysm, dissection, vasculitis or significant stenosis. Inflow: Patent without evidence of aneurysm, dissection, vasculitis or significant stenosis. Veins: No obvious venous abnormality within the limitations of this arterial phase study. Review of the MIP images confirms the above findings. NON-VASCULAR Hepatobiliary: The liver is markedly cirrhotic. Round lesion in the liver measuring 4.0 x 4.5 cm in the right hepatic lobe consistent with the patient's history of hepatocellular carcinoma is noted. 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 Biliary tree is unremarkable. Vascular coils in the porta hepatis are identified. Pancreas: Unremarkable. No  pancreatic ductal dilatation or surrounding inflammatory changes. Spleen: No focal lesion.  Splenomegaly. Adrenals/Urinary Tract: Renal cysts are noted. The kidneys otherwise appear normal. Ureters and urinary bladder are unremarkable. Adrenal glands appear normal. Stomach/Bowel: Stomach is within normal limits. Appendix appears normal. No evidence of bowel wall thickening, distention, or inflammatory changes. Lymphatic: No lymphadenopathy. Reproductive: Prostate is unremarkable. Other: No free or focal fluid collection. Musculoskeletal: There is a large lytic lesion and soft tissue mass in the posterior aspect of the right L1 vertebral body and pedicle. Tumor extends into the right L1-2 neural foramen and extends into the right epidural space. Second destructive lesion is seen on the right at L3 where epidural tumor extends into the thecal sac and right L3-4 foramen. Smaller lytic lesions in L4, L5 and pelvis are noted. Review of the MIP images confirms the above findings. IMPRESSION: Negative for aortic dissection or aneurysm. No acute abnormality chest, abdomen or pelvis. Markedly cirrhotic liver with a mass consistent with the patient's known a hepatocellular carcinoma. 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 These findings could be better evaluated with MRI with and without contrast. Electronically Signed   By: Inge Rise M.D.   On: 01/03/2019 21:28   Medications: I have reviewed the patient's current medications. Scheduled Meds: . sodium chloride   Intravenous Once  . dexamethasone  4 mg Intravenous Y1E  . folic acid  1 mg Oral Daily  . multivitamin with minerals  1 tablet Oral Daily  . orphenadrine  60 mg Intramuscular Q12H  . pantoprazole (PROTONIX) IV  40 mg Intravenous Q12H  . sodium chloride flush  3 mL Intravenous Q12H  .  thiamine  100 mg Oral Daily   Or  . thiamine  100 mg Intravenous Daily   Continuous Infusions: . sodium chloride 75 mL/hr at 01/05/19 0110  . octreotide  (SANDOSTATIN)    IV infusion 50 mcg/hr (01/05/19 0305)   PRN Meds:.LORazepam **OR** LORazepam, morphine, ondansetron **OR** ondansetron (ZOFRAN) IV   Assessment: Active Problems:   GI bleed   Hematemesis with nausea    Plan: This patient had hematemesis on admission and no further sign of bleeding although his hemoglobin continues to drop.  The patient will be set up for a repeat upper endoscopy despite his EGD on June 9 of this year showing grade 1 esophageal varices with hypertensive portal gastropathy.  If this is negative and his hemoglobin does not start to increase he may need a colonoscopy.  The patient has been explained the plan and agrees with it.   LOS: 2 days   Andrew Gibbs 01/05/2019, 11:08 AM

## 2019-01-05 NOTE — H&P (View-Only) (Signed)
Andrew Lame, MD [facility]   [address] [place], [place] [address] Phone: [phone] Fax : [phone]   Subjective: This patient came in with back pain and a history of hepatocellular carcinoma with cirrhosis.  The patient had a drop in his hemoglobin and hematemesis in the ER.  The patient has had no further vomiting and denies any black stools.  The patient's COVID-19 test came back negative yesterday.  The patient's hemoglobin yesterday morning was 8.8 and by the evening was 7.3 despite no overt sign of bleeding.   Objective: Vital signs in last 24 hours: Vitals:   01/04/19 0040 01/04/19 1245 01/04/19 2057 01/05/19 0512  BP: (!) 126/97 (!) 119/57 133/65 (!) 109/56  Pulse: 82 79 72 74  Resp:  16 18 16   Temp: 98.6 F (37 C) 98.7 F (37.1 C) 99 F (37.2 C) 99.3 F (37.4 C)  TempSrc: Oral Oral Oral Oral  SpO2: 100% 100% 100% 100%  Weight:      Height:       Weight change:   Intake/Output Summary (Last 24 hours) at 01/05/2019 1108 Last data filed at 01/05/2019 0110 Gross per 24 hour  Intake 2049.41 ml  Output 425 ml  Net 1624.41 ml     Exam: Heart:: Regular rate and rhythm, S1S2 present or without murmur or extra heart sounds Lungs: normal and clear to auscultation and percussion Abdomen: soft, nontender, normal bowel sounds   Lab Results: @LABTEST2 @ Micro Results: Recent Results (from the past 240 hour(s))  SARS Coronavirus 2 (CEPHEID - Performed in [place] hospital lab), Hosp Order     Status: None   Collection Time: 01/04/19 10:27 AM   Specimen: 1-NP & 1-OP swab, 1-sputum (productive cough); Nasopharyngeal  Result Value Ref Range Status   SARS Coronavirus 2 NEGATIVE NEGATIVE Final    Comment: (NOTE) If result is NEGATIVE SARS-CoV-2 target nucleic acids are NOT DETECTED. The SARS-CoV-2 RNA is generally detectable in upper and lower  respiratory specimens during the acute phase of infection. The lowest  concentration of SARS-CoV-2 viral copies  this assay can detect is 250  copies / mL. A negative result does not preclude SARS-CoV-2 infection  and should not be used as the sole basis for treatment or other  patient management decisions.  A negative result may occur with  improper specimen collection / handling, submission of specimen other  than nasopharyngeal swab, presence of viral mutation(s) within the  areas targeted by this assay, and inadequate number of viral copies  (<250 copies / mL). A negative result must be combined with clinical  observations, patient history, and epidemiological information. If result is POSITIVE SARS-CoV-2 target nucleic acids are DETECTED. The SARS-CoV-2 RNA is generally detectable in upper and lower  respiratory specimens dur ing the acute phase of infection.  Positive  results are indicative of active infection with SARS-CoV-2.  Clinical  correlation with patient history and other diagnostic information is  necessary to determine patient infection status.  Positive results do  not rule out bacterial infection or co-infection with other viruses. If result is PRESUMPTIVE POSTIVE SARS-CoV-2 nucleic acids MAY BE PRESENT.   A presumptive positive result was obtained on the submitted specimen  and confirmed on repeat testing.  While 2019 novel coronavirus  (SARS-CoV-2) nucleic acids may be present in the submitted sample  additional confirmatory testing may be necessary for epidemiological  and / or clinical management purposes  to differentiate between  SARS-CoV-2 and other Sarbecovirus currently known to infect humans.  If clinically indicated additional testing with an alternate test  methodology [phone]) is advised. The SARS-CoV-2 RNA is generally  detectable in upper and lower respiratory sp ecimens during the acute  phase of infection. The expected result is Negative. Fact Sheet for Patients:  StrictlyIdeas.no Fact Sheet for Healthcare Providers:  BankingDealers.co.za This test is not yet approved or cleared by the [place] FDA and has been authorized for detection and/or diagnosis of SARS-CoV-2 by FDA under an Emergency Use Authorization (EUA).  This EUA will remain in effect (meaning this test can be used) for the duration of the COVID-19 declaration under Section 564(b)(1) of the Act, 21 U.S.C. section 360bbb-3(b)(1), unless the authorization is terminated or revoked sooner. Performed at [facility], [address] [place], [place] [address]    Studies/Results: Dg Chest 2 View  Result Date: 01/03/2019 CLINICAL DATA:  Low back pain for 2 months. History of hepatitis C, IV drug use, liver cancer. EXAM: CHEST - 2 VIEW COMPARISON:  Chest x-rays dated 04/17/2018 and 02/13/2018. FINDINGS: The heart size and mediastinal contours are within normal limits. Both lungs are clear. The visualized skeletal structures are unremarkable. IMPRESSION: No active cardiopulmonary disease. Electronically Signed   By: Franki Cabot M.D.   On: 01/03/2019 17:41   Ct Angio Chest/abd/pel For Dissection W And/or W/wo  Result Date: 01/03/2019 CLINICAL DATA:  Low back and generalized abdominal pain for 2 months. EXAM: CT ANGIOGRAPHY CHEST, ABDOMEN AND PELVIS TECHNIQUE: Multidetector CT imaging through the chest, abdomen and pelvis was performed using the standard protocol during bolus administration of intravenous contrast. Multiplanar reconstructed images and MIPs were obtained and reviewed to evaluate the vascular anatomy. CONTRAST:  100 mL ISOVUE-370 IOPAMIDOL (ISOVUE-370) INJECTION 76% COMPARISON:  CT chest 10/16/2018. CT angiogram abdomen and pelvis 08/27/2018. FINDINGS: CTA CHEST FINDINGS Cardiovascular: Preferential opacification of the thoracic aorta. No evidence of thoracic aortic aneurysm or dissection. Normal heart size. No pericardial effusion. Mediastinum/Nodes: No enlarged mediastinal, hilar, or axillary lymph  nodes. Thyroid gland, trachea, and esophagus demonstrate no significant findings. Lungs/Pleura: No pleural effusion. Lungs demonstrate mild dependent atelectasis. There is some emphysematous change in the apices. No nodule, mass or consolidative process. Musculoskeletal: Lytic lesions are seen and T2 and T3. Mild superior endplate compression fracture of T2 noted. Mixed lytic and sclerotic lesion is also present at T8. Paraspinous and epidural soft tissue tumor on the right are seen extending out of the T8 vertebral body. Tumor contacts the cord and extends into the right T7-8 foramen. There is a lytic lesion in the left T4 pedicle and medial margin of the left fourth rib. Review of the MIP images confirms the above findings. CTA ABDOMEN AND PELVIS FINDINGS VASCULAR Aorta: Normal caliber aorta without aneurysm, dissection, vasculitis or significant stenosis. Celiac: Patent without evidence of aneurysm, dissection, vasculitis or significant stenosis. SMA: Patent without evidence of aneurysm, dissection, vasculitis or significant stenosis. Renals: Both renal arteries are patent without evidence of aneurysm, dissection, vasculitis, fibromuscular dysplasia or significant stenosis. Small accessory renal arteries on the right and left noted. IMA: Patent without evidence of aneurysm, dissection, vasculitis or significant stenosis. Inflow: Patent without evidence of aneurysm, dissection, vasculitis or significant stenosis. Veins: No obvious venous abnormality within the limitations of this arterial phase study. Review of the MIP images confirms the above findings. NON-VASCULAR Hepatobiliary: The liver is markedly cirrhotic. Round lesion in the liver measuring 4.0 x 4.5 cm in the right hepatic lobe consistent with the patient's history of hepatocellular carcinoma is noted. 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 Biliary tree is unremarkable. Vascular coils in the porta hepatis are identified. Pancreas: Unremarkable. No  pancreatic ductal dilatation or surrounding inflammatory changes. Spleen: No focal lesion.  Splenomegaly. Adrenals/Urinary Tract: Renal cysts are noted. The kidneys otherwise appear normal. Ureters and urinary bladder are unremarkable. Adrenal glands appear normal. Stomach/Bowel: Stomach is within normal limits. Appendix appears normal. No evidence of bowel wall thickening, distention, or inflammatory changes. Lymphatic: No lymphadenopathy. Reproductive: Prostate is unremarkable. Other: No free or focal fluid collection. Musculoskeletal: There is a large lytic lesion and soft tissue mass in the posterior aspect of the right L1 vertebral body and pedicle. Tumor extends into the right L1-2 neural foramen and extends into the right epidural space. Second destructive lesion is seen on the right at L3 where epidural tumor extends into the thecal sac and right L3-4 foramen. Smaller lytic lesions in L4, L5 and pelvis are noted. Review of the MIP images confirms the above findings. IMPRESSION: Negative for aortic dissection or aneurysm. No acute abnormality chest, abdomen or pelvis. Markedly cirrhotic liver with a mass consistent with the patient's known a hepatocellular carcinoma. 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 These findings could be better evaluated with MRI with and without contrast. Electronically Signed   By: Inge Rise M.D.   On: 01/03/2019 21:28   Medications: I have reviewed the patient's current medications. Scheduled Meds: . sodium chloride   Intravenous Once  . dexamethasone  4 mg Intravenous K8M  . folic acid  1 mg Oral Daily  . multivitamin with minerals  1 tablet Oral Daily  . orphenadrine  60 mg Intramuscular Q12H  . pantoprazole (PROTONIX) IV  40 mg Intravenous Q12H  . sodium chloride flush  3 mL Intravenous Q12H  .  thiamine  100 mg Oral Daily   Or  . thiamine  100 mg Intravenous Daily   Continuous Infusions: . sodium chloride 75 mL/hr at 01/05/19 0110  . octreotide  (SANDOSTATIN)    IV infusion 50 mcg/hr (01/05/19 0305)   PRN Meds:.LORazepam **OR** LORazepam, morphine, ondansetron **OR** ondansetron (ZOFRAN) IV   Assessment: Active Problems:   GI bleed   Hematemesis with nausea    Plan: This patient had hematemesis on admission and no further sign of bleeding although his hemoglobin continues to drop.  The patient will be set up for a repeat upper endoscopy despite his EGD on June 9 of this year showing grade 1 esophageal varices with hypertensive portal gastropathy.  If this is negative and his hemoglobin does not start to increase he may need a colonoscopy.  The patient has been explained the plan and agrees with it.   LOS: 2 days   Andrew Gibbs 01/05/2019, 11:08 AM

## 2019-01-06 ENCOUNTER — Encounter: Payer: Self-pay | Admitting: Certified Registered Nurse Anesthetist

## 2019-01-06 ENCOUNTER — Inpatient Hospital Stay: Payer: Medicare Other | Admitting: Anesthesiology

## 2019-01-06 ENCOUNTER — Encounter: Payer: Self-pay | Admitting: Anesthesiology

## 2019-01-06 ENCOUNTER — Encounter
Admission: EM | Disposition: A | Discharge status: Home Health | Payer: Self-pay | Source: Home / Self Care | Attending: Internal Medicine

## 2019-01-06 ENCOUNTER — Ambulatory Visit: Payer: Medicare Other | Attending: Radiation Oncology | Admitting: Radiation Oncology

## 2019-01-06 ENCOUNTER — Ambulatory Visit: Payer: Medicare Other

## 2019-01-06 HISTORY — PX: ESOPHAGOGASTRODUODENOSCOPY: SHX5428

## 2019-01-06 LAB — HEMOGLOBIN AND HEMATOCRIT, BLOOD
HCT: 24.4 % — ABNORMAL LOW (ref 39.0–52.0)
HCT: 25.3 % — ABNORMAL LOW (ref 39.0–52.0)
Hemoglobin: 8.2 g/dL — ABNORMAL LOW (ref 13.0–17.0)
Hemoglobin: 8.2 g/dL — ABNORMAL LOW (ref 13.0–17.0)

## 2019-01-06 LAB — BASIC METABOLIC PANEL
Anion gap: 5 (ref 5–15)
BUN: 12 mg/dL (ref 8–23)
CO2: 21 mmol/L — ABNORMAL LOW (ref 22–32)
Calcium: 7.9 mg/dL — ABNORMAL LOW (ref 8.9–10.3)
Chloride: 111 mmol/L (ref 98–111)
Creatinine, Ser: 0.58 mg/dL — ABNORMAL LOW (ref 0.61–1.24)
GFR calc Af Amer: >60 mL/min (ref >60)
GFR calc non Af Amer: >60 mL/min (ref >60)
Glucose, Bld: 135 mg/dL — ABNORMAL HIGH (ref 70–99)
Potassium: 4.6 mmol/L (ref 3.5–5.1)
Sodium: 137 mmol/L (ref 135–145)

## 2019-01-06 LAB — CBC
HCT: 23.8 % — ABNORMAL LOW (ref 39.0–52.0)
Hemoglobin: 7.7 g/dL — ABNORMAL LOW (ref 13.0–17.0)
MCH: 31.3 pg (ref 26.0–34.0)
MCHC: 32.4 g/dL (ref 30.0–36.0)
MCV: 96.7 fL (ref 80.0–100.0)
Platelets: 86 10*3/uL — ABNORMAL LOW (ref 150–400)
RBC: 2.46 MIL/uL — ABNORMAL LOW (ref 4.22–5.81)
RDW: 20.5 % — ABNORMAL HIGH (ref 11.5–15.5)
WBC: 8.5 10*3/uL (ref 4.0–10.5)
nRBC: 0.2 % (ref 0.0–0.2)

## 2019-01-06 SURGERY — EGD (ESOPHAGOGASTRODUODENOSCOPY)
Anesthesia: General

## 2019-01-06 MED ORDER — PROPOFOL 10 MG/ML IV BOLUS
INTRAVENOUS | Status: DC | PRN
  Administered 2019-01-06: 60 mg via INTRAVENOUS

## 2019-01-06 MED ORDER — PROPOFOL 500 MG/50ML IV EMUL
INTRAVENOUS | Status: DC | PRN
  Administered 2019-01-06: 140 ug/kg/min via INTRAVENOUS

## 2019-01-06 MED ORDER — LIDOCAINE HCL (CARDIAC) PF 100 MG/5ML IV SOSY
PREFILLED_SYRINGE | INTRAVENOUS | Status: DC | PRN
  Administered 2019-01-06: 40 mg via INTRAVENOUS

## 2019-01-06 NOTE — Transfer of Care (Signed)
Immediate Anesthesia Transfer of Care Note  Patient: Andrew Gibbs  Procedure(s) Performed: ESOPHAGOGASTRODUODENOSCOPY (EGD) (N/A )  Patient Location: PACU  Anesthesia Type:General  Level of Consciousness: sedated  Airway & Oxygen Therapy: Patient Spontanous Breathing and Patient connected to nasal cannula oxygen  Post-op Assessment: Report given to RN and Post -op Vital signs reviewed and stable  Post vital signs: Reviewed and stable  Last Vitals:  Vitals Value Taken Time  BP 119/70 01/06/19 1152  Temp 36.2 C 01/06/19 1152  Pulse 64 01/06/19 1152  Resp 24 01/06/19 1152  SpO2 100 % 01/06/19 1152  Vitals shown include unvalidated device data.  Last Pain:  Vitals:   01/06/19 1152  TempSrc:   PainSc: 0-No pain      Patients Stated Pain Goal: 0 (00/92/33 0076)  Complications: No apparent anesthesia complications

## 2019-01-06 NOTE — Care Management Important Message (Signed)
Important Message  Patient Details  Name: RANEY KOEPPEN MRN: [id number] Date of Birth: 08-11-1955   Medicare Important Message Given:  Yes  Initial Medicare IM given by Patient Access Associate on 01/05/2019 at 12:08pm.  Still valid.    [name] 01/06/2019, 8:15 AM

## 2019-01-06 NOTE — Progress Notes (Signed)
[facility] NAME: Andrew Gibbs    MR#:  [id number]  DATE OF BIRTH:  Feb 24, 1956  SUBJECTIVE:  CHIEF COMPLAINT:   Chief Complaint  Patient presents with   Back Pain   -Patient still has low back pain and also complaining of epigastric pain. -Received 1 unit packed RBC transfusion and hemoglobin around 8 this morning -For EGD today  REVIEW OF SYSTEMS:  Review of Systems  Constitutional: Negative for chills, fever and malaise/fatigue.  HENT: Negative for congestion, ear discharge, hearing loss and nosebleeds.   Eyes: Negative for blurred vision and double vision.  Respiratory: Negative for cough, shortness of breath and wheezing.   Cardiovascular: Negative for chest pain, palpitations and leg swelling.  Gastrointestinal: Positive for abdominal pain. Negative for constipation, diarrhea, nausea and vomiting.  Genitourinary: Negative for dysuria.  Musculoskeletal: Positive for back pain and myalgias.  Neurological: Negative for dizziness, focal weakness, seizures, weakness and headaches.  Psychiatric/Behavioral: Negative for depression.    DRUG ALLERGIES:  No Known Allergies  VITALS:  Blood pressure (!) 116/58, pulse 67, temperature 98.6 F (37 C), temperature source Oral, resp. rate 18, height 5\' 7"  (1.702 m), weight 58 kg, SpO2 100 %.  PHYSICAL EXAMINATION:  Physical Exam   GENERAL:  63 y.o.-year-old disheveled appearing patient lying in the bed with no acute distress.  EYES: Pupils equal, round, reactive to light and accommodation. No scleral icterus. Extraocular muscles intact.  HEENT: Head atraumatic, normocephalic. Oropharynx and nasopharynx clear.  NECK:  Supple, no jugular venous distention. No thyroid enlargement, no tenderness.  LUNGS: Normal breath sounds bilaterally, no wheezing, rales,rhonchi or crepitation. No use of accessory muscles of respiration.  Decreased bibasilar breath sound CARDIOVASCULAR: S1, S2  normal. No murmurs, rubs, or gallops.  ABDOMEN: Soft, generalized discomfort without guarding or rigidity nondistended. Bowel sounds present. No organomegaly or mass.  EXTREMITIES: No pedal edema, cyanosis, or clubbing.  NEUROLOGIC: Cranial nerves II through XII are intact. Muscle strength 5/5 in all extremities. Sensation intact. Gait not checked.  Overall weakness noted PSYCHIATRIC: The patient is alert and oriented x 3.  SKIN: No obvious rash, lesion, or ulcer.    LABORATORY PANEL:   CBC Recent Labs  Lab 01/06/19 0429 01/06/19 0736  WBC 8.5  --   HGB 7.7* 8.2*  HCT 23.8* 24.4*  PLT 86*  --    ------------------------------------------------------------------------------------------------------------------  Chemistries  Recent Labs  Lab 01/03/19 1503 01/04/19 0622  NA 137 145  K 5.4* 4.3  CL 108 118*  CO2 18* 17*  GLUCOSE 101* 113*  BUN 20 24*  CREATININE 0.52* 0.66  CALCIUM 8.3* 8.7*  AST 431*  --   ALT 97*  --   ALKPHOS 194*  --   BILITOT 2.8*  --    ------------------------------------------------------------------------------------------------------------------  Cardiac Enzymes No results for input(s): TROPONINI in the last 168 hours. ------------------------------------------------------------------------------------------------------------------  RADIOLOGY:  Mr Cervical Spine W Wo Contrast  Result Date: 01/05/2019 CLINICAL DATA:  Metastatic hepatocellular carcinoma with vertebral column metastases. Back pain and hematemesis. EXAM: MRI TOTAL SPINE WITHOUT AND WITH CONTRAST TECHNIQUE: Multisequence MR imaging of the spine from the cervical spine to the sacrum was performed prior to and following IV contrast administration for evaluation of spinal metastatic disease. CONTRAST:  5 mL Gadavist COMPARISON:  Thoracic spine MRI 04/20/2018 FINDINGS: MRI CERVICAL SPINE FINDINGS Alignment: Straightening of normal lordosis.  No static subluxation. Vertebrae: There is  collapse of the superior endplate of T1 without marrow edema. There  is abnormal T1-weighted signal and contrast enhancement within the right posterior elements of C7, with edema and enhancement also of the left facet joint, with a less masslike appearance. There is a small enhancing lesion at C3. Cord: Normal signal and caliber.  No epidural abnormality. Posterior Fossa, vertebral arteries, paraspinal tissues: Posterior fossa is normal. Vertebral artery flow voids are normal. No prevertebral effusion. Disc levels: C2-3: Mild disc bulge without spinal canal or neural foraminal stenosis. C3-4: Intermediate disc osteophyte complex with severe right and mild left foraminal stenosis. Narrowing of the ventral thecal sac without spinal canal stenosis. C4-5: Small disc osteophyte complex with mild right foraminal narrowing. No central spinal canal stenosis. C5-6: Mild uncovertebral hypertrophy without spinal canal or neural foraminal stenosis. C6-7: Small disc bulge without spinal canal or neural foraminal stenosis. C7-T1: Severe narrowing of the disc space with subsidence of the inferior C7 endplate into the superior T1 endplate. As above, there is abnormal contrast enhancement of the posterior elements with a mass lesion in the right T1 lamina that extends into the spinous process. MRI THORACIC SPINE FINDINGS Alignment:  Physiologic. Vertebrae: There are multiple metastatic lesions within the thoracic spine: 1. T1 right posterior elements. 2. T2 vertebral body with mild superior endplate collapse. 3. T4 left pedicle with encroachment on the T3-4 and T4-5 left neural foramina. 4. T8 vertebral body lesion occupying most of the vertebral body with early extension into the right pedicle and encroachment into the right T7-8 and T8-9 neural foramina. 5. T10 spinous process. Cord:  Normal signal and caliber. Paraspinal and other soft tissues: Unremarkable Disc levels: T3-4: Mild narrowing of the left neural foramen due to tumor  within the left T4 pedicle. T4-5: Mild left foraminal stenosis due to left T4 pedicle tumor, contacting the exiting left C4 nerve root. T7-8: Moderate right foraminal stenosis due to tumor encroachment from the expansile portion of the T8 tumor in the right pedicle. No spinal canal stenosis. T8-9: Mild encroachment into the right neural foramen from the T8 right pedicle tumor. MRI LUMBAR SPINE FINDINGS Segmentation:  Standard. Alignment:  Physiologic. Vertebrae: 1. Large tumor within the right L1 vertebral body extending into the posterior elements there is encroachment into the right lateral aspect of the thecal sac. 2. Bulky lesion in the right posterior aspect of the L3 vertebral body that encroaches on the right lateral aspect of the thecal sac. 3. Abnormal marrow signal in the left pedicle and posterior vertebral body of L4 without expansile lesion. 4. Nonexpansile left pedicle lesion of L5. 5. Small lesion at the dorsal aspect of S1. Conus medullaris: Extends to the L1 level and appears normal. Paraspinal and other soft tissues: The visualized vascular, retroperitoneal and paraspinal structures are normal. Disc levels: L1-2: Narrowing of the right subarticular recess by epidural tumor encroachment, also causing moderate right foraminal stenosis. No central spinal canal stenosis. L2-3: There is narrowing of the inferior right lateral recess due to tumor extension from the bulky L3 lesion, displacing the descending right L3 nerve root. There is no central spinal canal or neural foraminal stenosis. L3-4: There is narrowing of the right lateral recess along the length of the L3 vertebral body due to tumor extension into the epidural space, displacing the descending right L3 and L4 nerve roots. There is no central spinal canal or neural foraminal stenosis. L4-5: No spinal canal or neural foraminal stenosis. L5-S1: No spinal canal or neural foraminal stenosis. IMPRESSION: 1. Widespread spinal metastatic disease,  worst in the lumbar spine, where there  is epidural tumor extension at the L1 and L3 levels, narrowing the right lateral recesses and encroaching on the right neural foramina. 2. Tumor encroachment on the left T3, left T4, right T7 and right T8 neural foramina. 3. Mild cervical metastatic disease without epidural encroachment. 4. No pathologic fracture. Electronically Signed   By: Ulyses Jarred M.D.   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 Back pain and hematemesis. EXAM: MRI TOTAL SPINE WITHOUT AND WITH CONTRAST TECHNIQUE: Multisequence MR imaging of the spine from the cervical spine to the sacrum was performed prior to and following IV contrast administration for evaluation of spinal metastatic disease. CONTRAST:  5 mL Gadavist COMPARISON:  Thoracic spine MRI 04/20/2018 FINDINGS: MRI CERVICAL SPINE FINDINGS Alignment: Straightening of normal lordosis.  No static subluxation. Vertebrae: There is collapse of the superior endplate of T1 without marrow edema. There is abnormal T1-weighted signal and contrast enhancement within the right posterior elements of C7, with edema and enhancement also of the left facet joint, with a less masslike appearance. There is a small enhancing lesion at C3. Cord: Normal signal and caliber.  No epidural abnormality. Posterior Fossa, vertebral arteries, paraspinal tissues: Posterior fossa is normal. Vertebral artery flow voids are normal. No prevertebral effusion. Disc levels: C2-3: Mild disc bulge without spinal canal or neural foraminal stenosis. C3-4: Intermediate disc osteophyte complex with severe right and mild left foraminal stenosis. Narrowing of the ventral thecal sac without spinal canal stenosis. C4-5: Small disc osteophyte complex with mild right foraminal narrowing. No central spinal canal stenosis. C5-6: Mild uncovertebral hypertrophy without  spinal canal or neural foraminal stenosis. C6-7: Small disc bulge without spinal canal or neural foraminal stenosis. C7-T1: Severe narrowing of the disc space with subsidence of the inferior C7 endplate into the superior T1 endplate. As above, there is abnormal contrast enhancement of the posterior elements with a mass lesion in the right T1 lamina that extends into the spinous process. MRI THORACIC SPINE FINDINGS Alignment:  Physiologic. Vertebrae: There are multiple metastatic lesions within the thoracic spine: 1. T1 right posterior elements. 2. T2 vertebral body with mild superior endplate collapse. 3. T4 left pedicle with encroachment on the T3-4 and T4-5 left neural foramina. 4. T8 vertebral body lesion occupying most of the vertebral body with early extension into the right pedicle and encroachment into the right T7-8 and T8-9 neural foramina. 5. T10 spinous process. Cord:  Normal signal and caliber. Paraspinal and other soft tissues: Unremarkable Disc levels: T3-4: Mild narrowing of the left neural foramen due to tumor within the left T4 pedicle. T4-5: Mild left foraminal stenosis due to left T4 pedicle tumor, contacting the exiting left C4 nerve root. T7-8: Moderate right foraminal stenosis due to tumor encroachment from the expansile portion of the T8 tumor in the right pedicle. No spinal canal stenosis. T8-9: Mild encroachment into the right neural foramen from the T8 right pedicle tumor. MRI LUMBAR SPINE FINDINGS Segmentation:  Standard. Alignment:  Physiologic. Vertebrae: 1. Large tumor within the right L1 vertebral body extending into the posterior elements there is encroachment into the right lateral aspect of the thecal sac. 2. Bulky lesion in the right posterior aspect of the L3 vertebral body that encroaches on the right lateral aspect of the thecal sac. 3. Abnormal marrow signal in the left pedicle and posterior vertebral body of L4 without expansile lesion. 4. Nonexpansile left pedicle lesion of  L5.  5. Small lesion at the dorsal aspect of S1. Conus medullaris: Extends to the L1 level and appears normal. Paraspinal and other soft tissues: The visualized vascular, retroperitoneal and paraspinal structures are normal. Disc levels: L1-2: Narrowing of the right subarticular recess by epidural tumor encroachment, also causing moderate right foraminal stenosis. No central spinal canal stenosis. L2-3: There is narrowing of the inferior right lateral recess due to tumor extension from the bulky L3 lesion, displacing the descending right L3 nerve root. There is no central spinal canal or neural foraminal stenosis. L3-4: There is narrowing of the right lateral recess along the length of the L3 vertebral body due to tumor extension into the epidural space, displacing the descending right L3 and L4 nerve roots. There is no central spinal canal or neural foraminal stenosis. L4-5: No spinal canal or neural foraminal stenosis. L5-S1: No spinal canal or neural foraminal stenosis. IMPRESSION: 1. 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 2. Tumor encroachment on the left T3, left T4, right T7 and right T8 neural foramina. 3. Mild cervical metastatic disease without epidural encroachment. 4. No pathologic fracture. Electronically Signed   By: Ulyses Jarred M.D.   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 Back pain and hematemesis. EXAM: MRI TOTAL SPINE WITHOUT AND WITH CONTRAST TECHNIQUE: Multisequence MR imaging of the spine from the cervical spine to the sacrum was performed prior to and following IV contrast administration for evaluation of spinal metastatic disease. CONTRAST:  5 mL Gadavist COMPARISON:  Thoracic spine MRI  04/20/2018 FINDINGS: MRI CERVICAL SPINE FINDINGS Alignment: Straightening of normal lordosis.  No static subluxation. Vertebrae: There is collapse of the superior endplate of T1 without marrow edema. There is abnormal T1-weighted signal and contrast enhancement within the right posterior elements of C7, with edema and enhancement also of the left facet joint, with a less masslike appearance. There is a small enhancing lesion at C3. Cord: Normal signal and caliber.  No epidural abnormality. Posterior Fossa, vertebral arteries, paraspinal tissues: Posterior fossa is normal. Vertebral artery flow voids are normal. No prevertebral effusion. Disc levels: C2-3: Mild disc bulge without spinal canal or neural foraminal stenosis. C3-4: Intermediate disc osteophyte complex with severe right and mild left foraminal stenosis. Narrowing of the ventral thecal sac without spinal canal stenosis. C4-5: Small disc osteophyte complex with mild right foraminal narrowing. No central spinal canal stenosis. C5-6: Mild uncovertebral hypertrophy without spinal canal or neural foraminal stenosis. C6-7: Small disc bulge without spinal canal or neural foraminal stenosis. C7-T1: Severe narrowing of the disc space with subsidence of the inferior C7 endplate into the superior T1 endplate. As above, there is abnormal contrast enhancement of the posterior elements with a mass lesion in the right T1 lamina that extends into the spinous process. MRI THORACIC SPINE FINDINGS Alignment:  Physiologic. Vertebrae: There are multiple metastatic lesions within the thoracic spine: 1. T1 right posterior elements. 2. T2 vertebral body with mild superior endplate collapse. 3. T4 left pedicle with encroachment on the T3-4 and T4-5 left neural foramina. 4. T8 vertebral body lesion occupying most of the vertebral body with early extension into the right pedicle and encroachment into the right T7-8 and T8-9 neural foramina. 5. T10 spinous process. Cord:  Normal  signal and caliber.  Paraspinal and other soft tissues: Unremarkable Disc levels: T3-4: Mild narrowing of the left neural foramen due to tumor within the left T4 pedicle. T4-5: Mild left foraminal stenosis due to left T4 pedicle tumor, contacting the exiting left C4 nerve root. T7-8: Moderate right foraminal stenosis due to tumor encroachment from the expansile portion of the T8 tumor in the right pedicle. No spinal canal stenosis. T8-9: Mild encroachment into the right neural foramen from the T8 right pedicle tumor. MRI LUMBAR SPINE FINDINGS Segmentation:  Standard. Alignment:  Physiologic. Vertebrae: 1. Large tumor within the right L1 vertebral body extending into the posterior elements there is encroachment into the right lateral aspect of the thecal sac. 2. Bulky lesion in the right posterior aspect of the L3 vertebral body that encroaches on the right lateral aspect of the thecal sac. 3. Abnormal marrow signal in the left pedicle and posterior vertebral body of L4 without expansile lesion. 4. Nonexpansile left pedicle lesion of L5. 5. Small lesion at the dorsal aspect of S1. Conus medullaris: Extends to the L1 level and appears normal. Paraspinal and other soft tissues: The visualized vascular, retroperitoneal and paraspinal structures are normal. Disc levels: L1-2: Narrowing of the right subarticular recess by epidural tumor encroachment, also causing moderate right foraminal stenosis. No central spinal canal stenosis. L2-3: There is narrowing of the inferior right lateral recess due to tumor extension from the bulky L3 lesion, displacing the descending right L3 nerve root. There is no central spinal canal or neural foraminal stenosis. L3-4: There is narrowing of the right lateral recess along the length of the L3 vertebral body due to tumor extension into the epidural space, displacing the descending right L3 and L4 nerve roots. There is no central spinal canal or neural foraminal stenosis. L4-5: No spinal  canal or neural foraminal stenosis. L5-S1: No spinal canal or neural foraminal stenosis. IMPRESSION: 1. 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 2. Tumor encroachment on the left T3, left T4, right T7 and right T8 neural foramina. 3. Mild cervical metastatic disease without epidural encroachment. 4. No pathologic fracture. Electronically Signed   By: Ulyses Jarred M.D.   On: 01/05/2019 19:56    EKG:   Orders placed or performed during the hospital encounter of 01/03/19   ED EKG   ED EKG   EKG 12-Lead   EKG 12-Lead    ASSESSMENT AND PLAN:   63 year old male with past medical history significant for hepatitis C, hepatocellular carcinoma, alcohol abuse, liver cirrhosis, history of IV drug abuse who had a recent upper endoscopy done for esophageal varices comes to the hospital secondary to lower back pain and noted to have an episode of hematemesis  1.  Upper GI bleed-had hematemesis on admission.  Hemoglobin further dropped to 7, received 1 unit transfusion and hemoglobin at 8. -Known history of grade 1 esophageal varices from recent EGD recently. -Appreciate GI consult.  Due to drop in hemoglobin, repeat EGD being done today.  If it is negative, patient might need a colonoscopy. -Continue to monitor hemoglobin at this time. -Patient also has a history of NSAID use -Currently on octreotide drip and IV Protonix  2.  Acute on chronic low back pain-patient with metastatic hepatocellular carcinoma, CT of the abdomen showing T8, L1 L3 destruction of bone and epidural tumor extending into the neural foramina. -MRI of the  whole spine showing significant metastatic disease, much worse in the lumbar region. -Appreciate oncology consult.  Radiation oncology has been consulted. -Pain medications for now.  on IV Decadron.  3.   Hepatocellular carcinoma-based on MRI of the abdomen in October 2019 showing 4 cm liver mass.  Not a candidate for liver transplant.  Underwent ablation in April 2020 with some improvement in abdominal pain. Pre-ablation AFP fevers greater than 33,000.  Post ablation AFP is still at [place] -Patient is not currently on systemic therapy -Has metastatic disease to his spine - Patient had a CT of the abdomen done on admission  4.  Polysubstance abuse-counseled.  Also has alcohol abuse history.  Not recently.  Urine tox screen is negative this time  5.  DVT prophylaxis-teds and SCDs only  Patient is independent at baseline    All the records are reviewed and case discussed with Care Management/Social Workerr. Management plans discussed with the patient, family and they are in agreement.  CODE STATUS: Full code  TOTAL TIME TAKING CARE OF THIS PATIENT: 36 minutes.   POSSIBLE D/C IN 1-2 DAYS, DEPENDING ON CLINICAL CONDITION.   Gladstone Lighter M.D on 01/06/2019 at 9:47 AM  Between 7am to 6pm - Pager - [phone]  After 6pm go to [email or web address] - password [email or web address] [facility]  Office  [phone]  CC: Primary care physician; Center, [facility]

## 2019-01-06 NOTE — Anesthesia Postprocedure Evaluation (Signed)
Anesthesia Post Note  Patient: Andrew Gibbs  Procedure(s) Performed: ESOPHAGOGASTRODUODENOSCOPY (EGD) (N/A )  Patient location during evaluation: Endoscopy Anesthesia Type: General Level of consciousness: awake and alert Pain management: pain level controlled Vital Signs Assessment: post-procedure vital signs reviewed and stable Respiratory status: spontaneous breathing, nonlabored ventilation, respiratory function stable and patient connected to nasal cannula oxygen Cardiovascular status: blood pressure returned to baseline and stable Postop Assessment: no apparent nausea or vomiting Anesthetic complications: no     Last Vitals:  Vitals:   01/06/19 1153 01/06/19 1245  BP:  (!) 161/83  Pulse:  (!) 59  Resp:    Temp: (!) 36.2 C 36.9 C  SpO2:  100%    Last Pain:  Vitals:   01/06/19 1319  TempSrc:   PainSc: [place]

## 2019-01-06 NOTE — Anesthesia Preprocedure Evaluation (Addendum)
Anesthesia Evaluation  Patient identified by MRN, date of birth, ID band Patient awake    Reviewed: Allergy & Precautions, NPO status , Patient's Chart, lab work & pertinent test results, reviewed documented beta blocker date and time   Airway Mallampati: III  TM Distance: >3 FB     Dental  (+) Chipped, Poor Dentition, Missing   Pulmonary Current Smoker,           Cardiovascular      Neuro/Psych    GI/Hepatic (+) Hepatitis -  Endo/Other    Renal/GU      Musculoskeletal  (+) Arthritis ,   Abdominal   Peds  Hematology  (+) Blood dyscrasia, ,   Anesthesia Other Findings EKG ok. Echo 55-60.  Reproductive/Obstetrics                            Anesthesia Physical Anesthesia Plan  ASA: III  Anesthesia Plan: General   Post-op Pain Management:    Induction: Intravenous  PONV Risk Score and Plan:   Airway Management Planned:   Additional Equipment:   Intra-op Plan:   Post-operative Plan:   Informed Consent: I have reviewed the patients History and Physical, chart, labs and discussed the procedure including the risks, benefits and alternatives for the proposed anesthesia with the patient or authorized representative who has indicated his/her understanding and acceptance.       Plan Discussed with: CRNA  Anesthesia Plan Comments:         Anesthesia Quick Evaluation

## 2019-01-06 NOTE — Interval H&P Note (Signed)
History and Physical Interval Note:  01/06/2019 12:47 PM  Andrew Gibbs  has presented today for surgery, with the diagnosis of Hematemesis.  The various methods of treatment have been discussed with the patient and family. After consideration of risks, benefits and other options for treatment, the patient has consented to  Procedure(s): ESOPHAGOGASTRODUODENOSCOPY (EGD) (N/A) as a surgical intervention.  The patient's history has been reviewed, patient examined, no change in status, stable for surgery.  I have reviewed the patient's chart and labs.  Questions were answered to the patient's satisfaction.     [name] [employer]

## 2019-01-06 NOTE — Consult Note (Signed)
NEW PATIENT EVALUATION  Name: Andrew Gibbs  MRN: [id number]  Date:   01/03/2019     DOB: 01-27-1956   This 63 y.o. male patient presents to the clinic for initial evaluation of spine metastasis from patient with known hepatocellular carcinoma.  REFERRING PHYSICIAN: No ref. provider found  CHIEF COMPLAINT:  Chief Complaint  Patient presents with  . Back Pain    DIAGNOSIS: The primary encounter diagnosis was Hematemesis with nausea. Diagnoses of Back pain, Other acute back pain, and Neoplasm related pain were also pertinent to this visit.   PREVIOUS INVESTIGATIONS:  Pathology report reviewed MRI scans of spine reviewed Clinical notes reviewed  HPI: Patient is a 63 year old male with known stage IV hepatocellular carcinoma recently underwent Y 90 treatments in April.  Patient has multiple comorbidities including IV drug abuse hepatitis C and alcohol abuse.  He presented to the emergency room with increasing lower back pain over the past 3 months.  He has had work-up including MRI scans of cervical thoracic and lumbar spine.  Most significant findings were at L1 with tumor extending into the posterior elements with encroachment to the right lateral aspect of the thecal sac.  There is also a bulky lesion in the right posterior aspect the L3 vertebral body there is also a non-expansile left pedicle lesion at L5.  Patient is ambulating well.  I been asked to consult the patient regarding palliative radiation therapy.  PLANNED TREATMENT REGIMEN: Palliative radiation therapy to lumbar spine  PAST MEDICAL HISTORY:  has a past medical history of Blood dyscrasia, Hepatitis C, and Heroin use.    PAST SURGICAL HISTORY:  Past Surgical History:  Procedure Laterality Date  . ESOPHAGOGASTRODUODENOSCOPY (EGD) WITH PROPOFOL N/A 12/24/2018   Procedure: ESOPHAGOGASTRODUODENOSCOPY (EGD) WITH PROPOFOL;  Surgeon: Jonathon Bellows, MD;  Location: [facility] ENDOSCOPY;  Service: Gastroenterology;  Laterality: N/A;  .  IR ANGIOGRAM SELECTIVE EACH ADDITIONAL VESSEL  10/17/2018  . IR ANGIOGRAM SELECTIVE EACH ADDITIONAL VESSEL  10/17/2018  . IR ANGIOGRAM SELECTIVE EACH ADDITIONAL VESSEL  10/17/2018  . IR ANGIOGRAM SELECTIVE EACH ADDITIONAL VESSEL  10/17/2018  . IR ANGIOGRAM SELECTIVE EACH ADDITIONAL VESSEL  10/17/2018  . IR ANGIOGRAM SELECTIVE EACH ADDITIONAL VESSEL  10/17/2018  . IR ANGIOGRAM SELECTIVE EACH ADDITIONAL VESSEL  10/17/2018  . IR ANGIOGRAM SELECTIVE EACH ADDITIONAL VESSEL  11/05/2018  . IR ANGIOGRAM SELECTIVE EACH ADDITIONAL VESSEL  11/05/2018  . IR ANGIOGRAM SELECTIVE EACH ADDITIONAL VESSEL  11/05/2018  . IR ANGIOGRAM VISCERAL SELECTIVE  10/17/2018  . IR ANGIOGRAM VISCERAL SELECTIVE  10/17/2018  . IR ANGIOGRAM VISCERAL SELECTIVE  11/05/2018  . IR EMBO ARTERIAL NOT HEMORR HEMANG INC GUIDE ROADMAPPING  10/17/2018  . IR EMBO TUMOR ORGAN ISCHEMIA INFARCT INC GUIDE ROADMAPPING  11/05/2018  . IR RADIOLOGIST EVAL & MGMT  05/22/2018  . IR RADIOLOGIST EVAL & MGMT  08/27/2018  . IR US GUIDE VASC ACCESS RIGHT  10/17/2018  . IR US GUIDE VASC ACCESS RIGHT  11/05/2018  . KNEE SURGERY    . TEE WITHOUT CARDIOVERSION N/A 04/22/2018   Procedure: TRANSESOPHAGEAL ECHOCARDIOGRAM (TEE);  Surgeon: Teodoro Spray, MD;  Location: [facility] ORS;  Service: Cardiovascular;  Laterality: N/A;    FAMILY HISTORY: family history includes Multiple myeloma in his mother.  SOCIAL HISTORY:  reports that he has been smoking. He has a 20.00 pack-year smoking history. He has never used smokeless tobacco. He reports previous alcohol use. He reports previous drug use. Drugs: IV, Cocaine, Heroin, and Marijuana.  ALLERGIES: Patient has no known allergies.  MEDICATIONS:  Current Facility-Administered Medications  Medication Dose Route Frequency Provider Last Rate Last Dose  . 0.9 %  sodium chloride infusion   Intravenous Continuous Mayer Camel, NP 75 mL/hr at 01/06/19 0655    . [MAR Hold] cefTRIAXone (ROCEPHIN) 2 g in sodium chloride 0.9 % 100 mL IVPB  2 g  Intravenous Daily Gladstone Lighter, MD   Stopped at 01/05/19 1537  . [MAR Hold] dexamethasone (DECADRON) injection 4 mg  4 mg Intravenous Q8H Sindy Guadeloupe, MD   4 mg at 01/06/19 0521  . [MAR Hold] folic acid (FOLVITE) tablet 1 mg  1 mg Oral Daily Seals, Theo Dills, NP   Stopped at 01/04/19 1024  . [MAR Hold] LORazepam (ATIVAN) injection 1 mg  1 mg Intravenous Q1H PRN Mansy, Arvella Merles, MD       Or  . Doug Sou Hold] LORazepam (ATIVAN) tablet 1 mg  1 mg Oral Q6H PRN Mansy, Arvella Merles, MD      . Mikaela.Ping Hold] morphine 4 MG/ML injection 4 mg  4 mg Intravenous Q4H PRN Sindy Guadeloupe, MD   4 mg at 01/05/19 1953  . [MAR Hold] multivitamin with minerals tablet 1 tablet  1 tablet Oral Daily Seals, Theo Dills, NP   Stopped at 01/04/19 1024  . octreotide (SANDOSTATIN) 500 mcg in sodium chloride 0.9 % 250 mL (2 mcg/mL) infusion  50 mcg/hr Intravenous Continuous Mayer Camel, NP 25 mL/hr at 01/06/19 0521 50 mcg/hr at 01/06/19 0521  . [MAR Hold] ondansetron (ZOFRAN) tablet 4 mg  4 mg Oral Q6H PRN Seals, Theo Dills, NP       Or  . Doug Sou Hold] ondansetron (ZOFRAN) injection 4 mg  4 mg Intravenous Q6H PRN Seals, Theo Dills, NP      . [MAR Hold] orphenadrine (NORFLEX) injection 60 mg  60 mg Intramuscular Q12H Triplett, Cari B, FNP   60 mg at 01/06/19 0528  . [MAR Hold] pantoprazole (PROTONIX) injection 40 mg  40 mg Intravenous Q12H Gladstone Lighter, MD   40 mg at 01/05/19 2220  . [MAR Hold] sodium chloride flush (NS) 0.9 % injection 3 mL  3 mL Intravenous Q12H Seals, Angela H, NP   3 mL at 01/05/19 1137  . [MAR Hold] thiamine (VITAMIN B-1) tablet 100 mg  100 mg Oral Daily Seals, Theo Dills, NP       Or  . Doug Sou Hold] thiamine (B-1) injection 100 mg  100 mg Intravenous Daily Seals, Theo Dills, NP   100 mg at 01/05/19 1136    ECOG PERFORMANCE STATUS:  1 - Symptomatic but completely ambulatory  REVIEW OF SYSTEMS: Aside from cirrhosis and hepatocellular carcinoma with increasing lower back pain secondary metastatic disease Patient  denies any weight loss, fatigue, weakness, fever, chills or night sweats. Patient denies any loss of vision, blurred vision. Patient denies any ringing  of the ears or hearing loss. No irregular heartbeat. Patient denies heart murmur or history of fainting. Patient denies any chest pain or pain radiating to her upper extremities. Patient denies any shortness of breath, difficulty breathing at night, cough or hemoptysis. Patient denies any swelling in the lower legs. Patient denies any nausea vomiting, vomiting of blood, or coffee ground material in the vomitus. Patient denies any stomach pain. Patient states has had normal bowel movements no significant constipation or diarrhea. Patient denies any dysuria, hematuria or significant nocturia. Patient denies any problems walking, swelling in the joints or loss of balance. Patient denies any skin changes, loss of  hair or loss of weight. Patient denies any excessive worrying or anxiety or significant depression. Patient denies any problems with insomnia. Patient denies excessive thirst, polyuria, polydipsia. Patient denies any swollen glands, patient denies easy bruising or easy bleeding. Patient denies any recent infections, allergies or URI. Patient "s visual fields have not changed significantly in recent time.   PHYSICAL EXAM: BP 119/70 (BP Location: Left Arm)   Pulse 65   Temp (!) 97.1 F (36.2 C) (Tympanic)   Resp (!) 24   Ht '5\' 7"'  (1.702 m)   Wt 127 lb 13.9 oz (58 kg)   SpO2 100%   BMI 20.03 kg/m  No focal neurologic deficits motor or sensory and DTR levels are equal and symmetric in the lower extremities.  Proprioception is intact.  Well-developed well-nourished patient in NAD. HEENT reveals PERLA, EOMI, discs not visualized.  Oral cavity is clear. No oral mucosal lesions are identified. Neck is clear without evidence of cervical or supraclavicular adenopathy. Lungs are clear to A&P. Cardiac examination is essentially unremarkable with regular rate  and rhythm without murmur rub or thrill. Abdomen is benign with no organomegaly or masses noted. Motor sensory and DTR levels are equal and symmetric in the upper and lower extremities. Cranial nerves II through XII are grossly intact. Proprioception is intact. No peripheral adenopathy or edema is identified. No motor or sensory levels are noted. Crude visual fields are within normal range.  LABORATORY DATA: Pathology report reviewed    RADIOLOGY RESULTS: MRI scans of cervical thoracic and lumbar spine reviewed   IMPRESSION: At this time like to go after first the area of pain involvement in his lumbar spine.  I would treat L1-S1 to 3000 cGy in 10 fractions risks and benefits of treatment occluding possible diarrhea fatigue alteration of blood counts and skin reaction all were discussed in detail with the patient.  I have personally set up and ordered CT simulation for tomorrow would like to start his treatments by the end of this week.  Patient seems to comprehend our treatment plan well.  I would like to take this opportunity to thank you for allowing me to participate in the care of your patient.[name]  PLAN: As above  Noreene Filbert, MD

## 2019-01-06 NOTE — Anesthesia Post-op Follow-up Note (Signed)
Anesthesia [facility] form completed.        

## 2019-01-06 NOTE — Op Note (Signed)
[facility] Gastroenterology Patient Name: Andrew Gibbs Procedure Date: 01/06/2019 11:37 AM MRN: [id number] Account #: [id number] Date of Birth: [birth year] Admit Type: Inpatient Age: 63 Room: [facility] ENDO ROOM 4 Gender: Male Note Status: Finalized Procedure:            Upper GI endoscopy Indications:          Hematemesis Providers:            Lucilla Lame MD, MD Referring MD:         No Local Md, MD (Referring MD) Medicines:            Propofol per Anesthesia Complications:        No immediate complications. Procedure:            Pre-Anesthesia Assessment:                       - Prior to the procedure, a History and Physical was                        performed, and patient medications and allergies were                        reviewed. The patient's tolerance of previous                        anesthesia was also reviewed. The risks and benefits of                        the procedure and the sedation options and risks were                        discussed with the patient. All questions were                        answered, and informed consent was obtained. Prior                        Anticoagulants: The patient has taken no previous                        anticoagulant or antiplatelet agents. ASA Grade                        Assessment: III - A patient with severe systemic                        disease. After reviewing the risks and benefits, the                        patient was deemed in satisfactory condition to undergo                        the procedure.                       After obtaining informed consent, the endoscope was                        passed under direct vision. Throughout the procedure,  the patient's blood pressure, pulse, and oxygen                        saturations were monitored continuously. The Endoscope                        was introduced through the mouth, and advanced to the   second part of duodenum. The upper GI endoscopy was                        accomplished without difficulty. The patient tolerated                        the procedure well. Findings:      Grade I varices were found in the lower third of the esophagus.      Mild portal hypertensive gastropathy was found in the entire examined       stomach.      The examined duodenum was normal. Impression:           - Grade I esophageal varices.                       - Portal hypertensive gastropathy.                       - Normal examined duodenum.                       - No specimens collected.                       - No sign of any recent or active bleeding. Recommendation:       - Return patient to hospital ward for ongoing care.                       - Resume regular diet.                       - Continue present medications. Procedure Code(s):    --- Professional ---                       [phone], Esophagogastroduodenoscopy, flexible, transoral;                        diagnostic, including collection of specimen(s) by                        brushing or washing, when performed (separate procedure) Diagnosis Code(s):    --- Professional ---                       K92.0, Hematemesis                       K76.6, Portal hypertension                       I85.00, Esophageal varices without bleeding CPT copyright 2019 [employer]. All rights reserved. The codes documented in this report are preliminary and upon coder review may  be revised to meet current compliance requirements. Lucilla Lame MD, MD 01/06/2019 11:53:28 AM This report has been signed electronically. Number of Addenda:  0 Note Initiated On: 01/06/2019 11:37 AM Estimated Blood Loss: Estimated blood loss: none.      [facility]

## 2019-01-07 ENCOUNTER — Encounter: Payer: Self-pay | Admitting: Gastroenterology

## 2019-01-07 ENCOUNTER — Ambulatory Visit
Admit: 2019-01-07 | Discharge: 2019-01-07 | Disposition: A | Discharge status: Home / Self Care | Payer: Medicare Other | Source: Ambulatory Visit | Attending: Radiation Oncology | Admitting: Radiation Oncology

## 2019-01-07 ENCOUNTER — Ambulatory Visit: Payer: Medicare Other

## 2019-01-07 DIAGNOSIS — Z510 Encounter for antineoplastic radiation therapy: Secondary | ICD-10-CM | POA: Insufficient documentation

## 2019-01-07 DIAGNOSIS — C7951 Secondary malignant neoplasm of bone: Secondary | ICD-10-CM | POA: Insufficient documentation

## 2019-01-07 DIAGNOSIS — C220 Liver cell carcinoma: Secondary | ICD-10-CM | POA: Insufficient documentation

## 2019-01-07 LAB — BASIC METABOLIC PANEL
Anion gap: 8 (ref 5–15)
BUN: 14 mg/dL (ref 8–23)
CO2: 20 mmol/L — ABNORMAL LOW (ref 22–32)
Calcium: 7.7 mg/dL — ABNORMAL LOW (ref 8.9–10.3)
Chloride: 109 mmol/L (ref 98–111)
Creatinine, Ser: 0.59 mg/dL — ABNORMAL LOW (ref 0.61–1.24)
GFR calc Af Amer: >60 mL/min (ref >60)
GFR calc non Af Amer: >60 mL/min (ref >60)
Glucose, Bld: 161 mg/dL — ABNORMAL HIGH (ref 70–99)
Potassium: 3.4 mmol/L — ABNORMAL LOW (ref 3.5–5.1)
Sodium: 137 mmol/L (ref 135–145)

## 2019-01-07 LAB — CBC
HCT: 21.9 % — ABNORMAL LOW (ref 39.0–52.0)
Hemoglobin: 7.3 g/dL — ABNORMAL LOW (ref 13.0–17.0)
MCH: 32.2 pg (ref 26.0–34.0)
MCHC: 33.3 g/dL (ref 30.0–36.0)
MCV: 96.5 fL (ref 80.0–100.0)
Platelets: 101 10*3/uL — ABNORMAL LOW (ref 150–400)
RBC: 2.27 MIL/uL — ABNORMAL LOW (ref 4.22–5.81)
RDW: 20.7 % — ABNORMAL HIGH (ref 11.5–15.5)
WBC: 8.1 10*3/uL (ref 4.0–10.5)
nRBC: 0.2 % (ref 0.0–0.2)

## 2019-01-07 LAB — TYPE AND SCREEN
ABO/RH(D): O POS
Antibody Screen: NEGATIVE
Unit division: 0
Unit division: 0
Unit division: 0

## 2019-01-07 LAB — BPAM RBC
Blood Product Expiration Date: 202007162359
Blood Product Expiration Date: 202007162359
Blood Product Expiration Date: 202007182359
ISSUE DATE / TIME: 202006211238
Unit Type and Rh: 5100
Unit Type and Rh: 5100
Unit Type and Rh: 5100

## 2019-01-07 LAB — RETICULOCYTES
Immature Retic Fract: 37.3 % — ABNORMAL HIGH (ref 2.3–15.9)
RBC.: 2.25 MIL/uL — ABNORMAL LOW (ref 4.22–5.81)
Retic Count, Absolute: 90.5 10*3/uL (ref 19.0–186.0)
Retic Ct Pct: 4 % — ABNORMAL HIGH (ref 0.4–3.1)

## 2019-01-07 LAB — PREPARE RBC (CROSSMATCH)

## 2019-01-07 LAB — VITAMIN B12: Vitamin B-12: 1370 pg/mL — ABNORMAL HIGH (ref 180–914)

## 2019-01-07 LAB — IRON AND TIBC
Iron: 41 ug/dL — ABNORMAL LOW (ref 45–182)
Saturation Ratios: 16 % — ABNORMAL LOW (ref 17.9–39.5)
TIBC: 255 ug/dL (ref 250–450)
UIBC: 214 ug/dL

## 2019-01-07 LAB — FOLATE: Folate: 9.7 ng/mL (ref >5.9)

## 2019-01-07 LAB — FERRITIN: Ferritin: 338 ng/mL — ABNORMAL HIGH (ref 24–336)

## 2019-01-07 MED ORDER — SODIUM CHLORIDE 0.9% IV SOLUTION
Freq: Once | INTRAVENOUS | Status: AC
  Administered 2019-01-07: 08:00:00 via INTRAVENOUS

## 2019-01-07 MED ORDER — PEG 3350-KCL-NA BICARB-NACL 420 G PO SOLR
4000.0000 mL | Freq: Once | ORAL | Status: AC
  Administered 2019-01-07: 4000 mL via ORAL
  Filled 2019-01-07: qty 4000

## 2019-01-07 MED ORDER — MORPHINE SULFATE (PF) 4 MG/ML IV SOLN
4.0000 mg | INTRAVENOUS | Status: DC | PRN
  Administered 2019-01-07: 4 mg via INTRAVENOUS
  Filled 2019-01-07: qty 1

## 2019-01-07 MED ORDER — POTASSIUM CHLORIDE CRYS ER 20 MEQ PO TBCR
40.0000 meq | EXTENDED_RELEASE_TABLET | Freq: Once | ORAL | Status: AC
  Administered 2019-01-07: 40 meq via ORAL
  Filled 2019-01-07: qty 2

## 2019-01-07 MED ORDER — OXYCODONE HCL 5 MG PO TABS
5.0000 mg | ORAL_TABLET | Freq: Four times a day (QID) | ORAL | Status: DC | PRN
  Administered 2019-01-10: 5 mg via ORAL
  Administered 2019-01-11: 21:00:00 10 mg via ORAL
  Administered 2019-01-11: 5 mg via ORAL
  Administered 2019-01-12: 10 mg via ORAL
  Administered 2019-01-12 – 2019-01-13 (×3): 5 mg via ORAL
  Filled 2019-01-07 (×3): qty 1
  Filled 2019-01-07 (×2): qty 2
  Filled 2019-01-07: qty 1
  Filled 2019-01-07: qty 2

## 2019-01-07 MED ORDER — PANTOPRAZOLE SODIUM 40 MG PO TBEC
40.0000 mg | DELAYED_RELEASE_TABLET | Freq: Every day | ORAL | Status: DC
  Administered 2019-01-07: 40 mg via ORAL

## 2019-01-07 NOTE — Progress Notes (Signed)
[facility] [place] NAME: Andrew Gibbs    MR#:  [id number]  DATE OF BIRTH:  March 15, 1956  SUBJECTIVE:  CHIEF COMPLAINT:   Chief Complaint  Patient presents with   Back Pain   - still has low back pain, hb low again at 7.3, 1 more unit being transfused today - colonoscopy needed- likely tomorrow  REVIEW OF SYSTEMS:  Review of Systems  Constitutional: Negative for chills, fever and malaise/fatigue.  HENT: Negative for congestion, ear discharge, hearing loss and nosebleeds.   Eyes: Negative for blurred vision and double vision.  Respiratory: Negative for cough, shortness of breath and wheezing.   Cardiovascular: Negative for chest pain, palpitations and leg swelling.  Gastrointestinal: Positive for abdominal pain. Negative for constipation, diarrhea, nausea and vomiting.  Genitourinary: Negative for dysuria.  Musculoskeletal: Positive for back pain and myalgias.  Neurological: Negative for dizziness, focal weakness, seizures, weakness and headaches.  Psychiatric/Behavioral: Negative for depression.    DRUG ALLERGIES:  No Known Allergies  VITALS:  Blood pressure 137/73, pulse (!) 58, temperature 98.4 F (36.9 C), temperature source Oral, resp. rate 20, height 5\' 7"  (1.702 m), weight 58 kg, SpO2 100 %.  PHYSICAL EXAMINATION:  Physical Exam   GENERAL:  63 y.o.-year-old disheveled appearing patient lying in the bed with no acute distress.  EYES: Pupils equal, round, reactive to light and accommodation. No scleral icterus. Extraocular muscles intact.  HEENT: Head atraumatic, normocephalic. Oropharynx and nasopharynx clear.  NECK:  Supple, no jugular venous distention. No thyroid enlargement, no tenderness.  LUNGS: Normal breath sounds bilaterally, no wheezing, rales,rhonchi or crepitation. No use of accessory muscles of respiration.  Decreased bibasilar breath sound CARDIOVASCULAR: S1, S2 normal. No murmurs, rubs, or gallops.    ABDOMEN: Soft, generalized discomfort without guarding or rigidity nondistended. Bowel sounds present. No organomegaly or mass.  EXTREMITIES: No pedal edema, cyanosis, or clubbing.  NEUROLOGIC: Cranial nerves II through XII are intact. Muscle strength 5/5 in all extremities. Sensation intact. Gait not checked.  Overall weakness noted PSYCHIATRIC: The patient is alert and oriented x 3.  SKIN: No obvious rash, lesion, or ulcer.    LABORATORY PANEL:   CBC Recent Labs  Lab 01/07/19 0331  WBC 8.1  HGB 7.3*  HCT 21.9*  PLT 101*   ------------------------------------------------------------------------------------------------------------------  Chemistries  Recent Labs  Lab 01/03/19 1503  01/07/19 0331  NA 137   < > 137  K 5.4*   < > 3.4*  CL 108   < > 109  CO2 18*   < > 20*  GLUCOSE 101*   < > 161*  BUN 20   < > 14  CREATININE 0.52*   < > 0.59*  CALCIUM 8.3*   < > 7.7*  AST 431*  --   --   ALT 97*  --   --   ALKPHOS 194*  --   --   BILITOT 2.8*  --   --    < > = values in this interval not displayed.   ------------------------------------------------------------------------------------------------------------------  Cardiac Enzymes No results for input(s): TROPONINI in the last 168 hours. ------------------------------------------------------------------------------------------------------------------  RADIOLOGY:  Mr Cervical Spine W Wo Contrast  Result Date: 01/05/2019 CLINICAL DATA:  Metastatic hepatocellular carcinoma with vertebral column metastases. Back pain and hematemesis. EXAM: MRI TOTAL SPINE WITHOUT AND WITH CONTRAST TECHNIQUE: Multisequence MR imaging of the spine from the cervical spine to the sacrum was performed prior to and following IV contrast administration for evaluation of spinal  metastatic disease. CONTRAST:  5 mL Gadavist COMPARISON:  Thoracic spine MRI 04/20/2018 FINDINGS: MRI CERVICAL SPINE FINDINGS Alignment: Straightening of normal lordosis.  No  static subluxation. Vertebrae: There is collapse of the superior endplate of T1 without marrow edema. There is abnormal T1-weighted signal and contrast enhancement within the right posterior elements of C7, with edema and enhancement also of the left facet joint, with a less masslike appearance. There is a small enhancing lesion at C3. Cord: Normal signal and caliber.  No epidural abnormality. Posterior Fossa, vertebral arteries, paraspinal tissues: Posterior fossa is normal. Vertebral artery flow voids are normal. No prevertebral effusion. Disc levels: C2-3: Mild disc bulge without spinal canal or neural foraminal stenosis. C3-4: Intermediate disc osteophyte complex with severe right and mild left foraminal stenosis. Narrowing of the ventral thecal sac without spinal canal stenosis. C4-5: Small disc osteophyte complex with mild right foraminal narrowing. No central spinal canal stenosis. C5-6: Mild uncovertebral hypertrophy without spinal canal or neural foraminal stenosis. C6-7: Small disc bulge without spinal canal or neural foraminal stenosis. C7-T1: Severe narrowing of the disc space with subsidence of the inferior C7 endplate into the superior T1 endplate. As above, there is abnormal contrast enhancement of the posterior elements with a mass lesion in the right T1 lamina that extends into the spinous process. MRI THORACIC SPINE FINDINGS Alignment:  Physiologic. Vertebrae: There are multiple metastatic lesions within the thoracic spine: 1. T1 right posterior elements. 2. T2 vertebral body with mild superior endplate collapse. 3. T4 left pedicle with encroachment on the T3-4 and T4-5 left neural foramina. 4. T8 vertebral body lesion occupying most of the vertebral body with early extension into the right pedicle and encroachment into the right T7-8 and T8-9 neural foramina. 5. T10 spinous process. Cord:  Normal signal and caliber. Paraspinal and other soft tissues: Unremarkable Disc levels: T3-4: Mild narrowing  of the left neural foramen due to tumor within the left T4 pedicle. T4-5: Mild left foraminal stenosis due to left T4 pedicle tumor, contacting the exiting left C4 nerve root. T7-8: Moderate right foraminal stenosis due to tumor encroachment from the expansile portion of the T8 tumor in the right pedicle. No spinal canal stenosis. T8-9: Mild encroachment into the right neural foramen from the T8 right pedicle tumor. MRI LUMBAR SPINE FINDINGS Segmentation:  Standard. Alignment:  Physiologic. Vertebrae: 1. Large tumor within the right L1 vertebral body extending into the posterior elements there is encroachment into the right lateral aspect of the thecal sac. 2. Bulky lesion in the right posterior aspect of the L3 vertebral body that encroaches on the right lateral aspect of the thecal sac. 3. Abnormal marrow signal in the left pedicle and posterior vertebral body of L4 without expansile lesion. 4. Nonexpansile left pedicle lesion of L5. 5. Small lesion at the dorsal aspect of S1. Conus medullaris: Extends to the L1 level and appears normal. Paraspinal and other soft tissues: The visualized vascular, retroperitoneal and paraspinal structures are normal. Disc levels: L1-2: Narrowing of the right subarticular recess by epidural tumor encroachment, also causing moderate right foraminal stenosis. No central spinal canal stenosis. L2-3: There is narrowing of the inferior right lateral recess due to tumor extension from the bulky L3 lesion, displacing the descending right L3 nerve root. There is no central spinal canal or neural foraminal stenosis. L3-4: There is narrowing of the right lateral recess along the length of the L3 vertebral body due to tumor extension into the epidural space, displacing the descending right L3 and L4  nerve roots. There is no central spinal canal or neural foraminal stenosis. L4-5: No spinal canal or neural foraminal stenosis. L5-S1: No spinal canal or neural foraminal stenosis. IMPRESSION: 1.  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 2. Tumor encroachment on the left T3, left T4, right T7 and right T8 neural foramina. 3. Mild cervical metastatic disease without epidural encroachment. 4. No pathologic fracture. Electronically Signed   By: Ulyses Jarred M.D.   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 Back pain and hematemesis. EXAM: MRI TOTAL SPINE WITHOUT AND WITH CONTRAST TECHNIQUE: Multisequence MR imaging of the spine from the cervical spine to the sacrum was performed prior to and following IV contrast administration for evaluation of spinal metastatic disease. CONTRAST:  5 mL Gadavist COMPARISON:  Thoracic spine MRI 04/20/2018 FINDINGS: MRI CERVICAL SPINE FINDINGS Alignment: Straightening of normal lordosis.  No static subluxation. Vertebrae: There is collapse of the superior endplate of T1 without marrow edema. There is abnormal T1-weighted signal and contrast enhancement within the right posterior elements of C7, with edema and enhancement also of the left facet joint, with a less masslike appearance. There is a small enhancing lesion at C3. Cord: Normal signal and caliber.  No epidural abnormality. Posterior Fossa, vertebral arteries, paraspinal tissues: Posterior fossa is normal. Vertebral artery flow voids are normal. No prevertebral effusion. Disc levels: C2-3: Mild disc bulge without spinal canal or neural foraminal stenosis. C3-4: Intermediate disc osteophyte complex with severe right and mild left foraminal stenosis. Narrowing of the ventral thecal sac without spinal canal stenosis. C4-5: Small disc osteophyte complex with mild right foraminal narrowing. No central spinal canal stenosis. C5-6:  Mild uncovertebral hypertrophy without spinal canal or neural foraminal stenosis. C6-7: Small disc bulge without spinal canal or neural foraminal stenosis. C7-T1: Severe narrowing of the disc space with subsidence of the inferior C7 endplate into the superior T1 endplate. As above, there is abnormal contrast enhancement of the posterior elements with a mass lesion in the right T1 lamina that extends into the spinous process. MRI THORACIC SPINE FINDINGS Alignment:  Physiologic. Vertebrae: There are multiple metastatic lesions within the thoracic spine: 1. T1 right posterior elements. 2. T2 vertebral body with mild superior endplate collapse. 3. T4 left pedicle with encroachment on the T3-4 and T4-5 left neural foramina. 4. T8 vertebral body lesion occupying most of the vertebral body with early extension into the right pedicle and encroachment into the right T7-8 and T8-9 neural foramina. 5. T10 spinous process. Cord:  Normal signal and caliber. Paraspinal and other soft tissues: Unremarkable Disc levels: T3-4: Mild narrowing of the left neural foramen due to tumor within the left T4 pedicle. T4-5: Mild left foraminal stenosis due to left T4 pedicle tumor, contacting the exiting left C4 nerve root. T7-8: Moderate right foraminal stenosis due to tumor encroachment from the expansile portion of the T8 tumor in the right pedicle. No spinal canal stenosis. T8-9: Mild encroachment into the right neural foramen from the T8 right pedicle tumor. MRI LUMBAR SPINE FINDINGS Segmentation:  Standard. Alignment:  Physiologic. Vertebrae: 1. Large tumor within the right L1 vertebral body extending into the posterior elements there is encroachment into the right lateral aspect of the thecal sac. 2. Bulky lesion in the right posterior  aspect of the L3 vertebral body that encroaches on the right lateral aspect of the thecal sac. 3. Abnormal marrow signal in the left pedicle and posterior vertebral body of L4 without expansile lesion.  4. Nonexpansile left pedicle lesion of L5. 5. Small lesion at the dorsal aspect of S1. Conus medullaris: Extends to the L1 level and appears normal. Paraspinal and other soft tissues: The visualized vascular, retroperitoneal and paraspinal structures are normal. Disc levels: L1-2: Narrowing of the right subarticular recess by epidural tumor encroachment, also causing moderate right foraminal stenosis. No central spinal canal stenosis. L2-3: There is narrowing of the inferior right lateral recess due to tumor extension from the bulky L3 lesion, displacing the descending right L3 nerve root. There is no central spinal canal or neural foraminal stenosis. L3-4: There is narrowing of the right lateral recess along the length of the L3 vertebral body due to tumor extension into the epidural space, displacing the descending right L3 and L4 nerve roots. There is no central spinal canal or neural foraminal stenosis. L4-5: No spinal canal or neural foraminal stenosis. L5-S1: No spinal canal or neural foraminal stenosis. IMPRESSION: 1. 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 2. Tumor encroachment on the left T3, left T4, right T7 and right T8 neural foramina. 3. Mild cervical metastatic disease without epidural encroachment. 4. No pathologic fracture. Electronically Signed   By: Ulyses Jarred M.D.   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 Back pain and hematemesis. EXAM: MRI TOTAL SPINE WITHOUT AND WITH CONTRAST TECHNIQUE: Multisequence MR imaging of the spine from the cervical spine to the sacrum was performed prior to and following IV contrast administration for evaluation of spinal metastatic disease. CONTRAST:  5 mL Gadavist  COMPARISON:  Thoracic spine MRI 04/20/2018 FINDINGS: MRI CERVICAL SPINE FINDINGS Alignment: Straightening of normal lordosis.  No static subluxation. Vertebrae: There is collapse of the superior endplate of T1 without marrow edema. There is abnormal T1-weighted signal and contrast enhancement within the right posterior elements of C7, with edema and enhancement also of the left facet joint, with a less masslike appearance. There is a small enhancing lesion at C3. Cord: Normal signal and caliber.  No epidural abnormality. Posterior Fossa, vertebral arteries, paraspinal tissues: Posterior fossa is normal. Vertebral artery flow voids are normal. No prevertebral effusion. Disc levels: C2-3: Mild disc bulge without spinal canal or neural foraminal stenosis. C3-4: Intermediate disc osteophyte complex with severe right and mild left foraminal stenosis. Narrowing of the ventral thecal sac without spinal canal stenosis. C4-5: Small disc osteophyte complex with mild right foraminal narrowing. No central spinal canal stenosis. C5-6: Mild uncovertebral hypertrophy without spinal canal or neural foraminal stenosis. C6-7: Small disc bulge without spinal canal or neural foraminal stenosis. C7-T1: Severe narrowing of the disc space with subsidence of the inferior C7 endplate into the superior T1 endplate. As above, there is abnormal contrast enhancement of the posterior elements with a mass lesion in the right T1 lamina that extends into the spinous process. MRI THORACIC SPINE FINDINGS Alignment:  Physiologic. Vertebrae: There are multiple metastatic lesions within the thoracic spine: 1. T1 right posterior elements. 2. T2 vertebral body with mild superior endplate collapse. 3. T4 left pedicle with encroachment on the T3-4 and T4-5  left neural foramina. 4. T8 vertebral body lesion occupying most of the vertebral body with early extension into the right pedicle and encroachment into the right T7-8 and T8-9 neural foramina. 5. T10  spinous process. Cord:  Normal signal and caliber. Paraspinal and other soft tissues: Unremarkable Disc levels: T3-4: Mild narrowing of the left neural foramen due to tumor within the left T4 pedicle. T4-5: Mild left foraminal stenosis due to left T4 pedicle tumor, contacting the exiting left C4 nerve root. T7-8: Moderate right foraminal stenosis due to tumor encroachment from the expansile portion of the T8 tumor in the right pedicle. No spinal canal stenosis. T8-9: Mild encroachment into the right neural foramen from the T8 right pedicle tumor. MRI LUMBAR SPINE FINDINGS Segmentation:  Standard. Alignment:  Physiologic. Vertebrae: 1. Large tumor within the right L1 vertebral body extending into the posterior elements there is encroachment into the right lateral aspect of the thecal sac. 2. Bulky lesion in the right posterior aspect of the L3 vertebral body that encroaches on the right lateral aspect of the thecal sac. 3. Abnormal marrow signal in the left pedicle and posterior vertebral body of L4 without expansile lesion. 4. Nonexpansile left pedicle lesion of L5. 5. Small lesion at the dorsal aspect of S1. Conus medullaris: Extends to the L1 level and appears normal. Paraspinal and other soft tissues: The visualized vascular, retroperitoneal and paraspinal structures are normal. Disc levels: L1-2: Narrowing of the right subarticular recess by epidural tumor encroachment, also causing moderate right foraminal stenosis. No central spinal canal stenosis. L2-3: There is narrowing of the inferior right lateral recess due to tumor extension from the bulky L3 lesion, displacing the descending right L3 nerve root. There is no central spinal canal or neural foraminal stenosis. L3-4: There is narrowing of the right lateral recess along the length of the L3 vertebral body due to tumor extension into the epidural space, displacing the descending right L3 and L4 nerve roots. There is no central spinal canal or neural  foraminal stenosis. L4-5: No spinal canal or neural foraminal stenosis. L5-S1: No spinal canal or neural foraminal stenosis. IMPRESSION: 1. 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 2. Tumor encroachment on the left T3, left T4, right T7 and right T8 neural foramina. 3. Mild cervical metastatic disease without epidural encroachment. 4. No pathologic fracture. Electronically Signed   By: Ulyses Jarred M.D.   On: 01/05/2019 19:56    EKG:   Orders placed or performed during the hospital encounter of 01/03/19   ED EKG   ED EKG   EKG 12-Lead   EKG 12-Lead    ASSESSMENT AND PLAN:   63 year old male with past medical history significant for hepatitis C, hepatocellular carcinoma, alcohol abuse, liver cirrhosis, history of IV drug abuse who had a recent upper endoscopy done for esophageal varices comes to the hospital secondary to lower back pain and noted to have an episode of hematemesis  1.  Upper GI bleed-had hematemesis on admission.  Received 1 unit prbc transfusion this adm, hb dropped again to 7.3 - 1 more unit ordered today - recent EGD 2 weeks ago with grade 1 esophageal varices, repeat EGD this adm showing the same -No active bleeding noted.  DC'd octreotide drip.  Change Protonix to oral -Since hemoglobin dropping, colonoscopy is being planned.  Likely tomorrow -Appreciate GI consult.  2.  Acute on chronic low back pain-patient with metastatic hepatocellular carcinoma, CT of the abdomen showing T8, L1 L3 destruction of bone and epidural tumor extending into the neural foramina. -MRI of the whole spine showing significant metastatic disease, much worse in the lumbar region. -Appreciate oncology consult.  Radiation oncology consult appreciated -Patient had simulation and marking done today.  Plan to start radiation  therapy to the lower back later next week.  Social worker consulted for transportation issues after discharge. -Pain medications for now.  on IV Decadron. -PT consult requested  3.  Hepatocellular carcinoma-based on MRI of the abdomen in October 2019 showing 4 cm liver mass.  Not a candidate for liver transplant.  Underwent ablation in April 2020 with some improvement in abdominal pain. Pre-ablation AFP fevers greater than 33,000.  Post ablation AFP is still at [place] -Patient is not currently on systemic therapy -Has metastatic disease to his spine - Patient had a CT of the abdomen done on admission  4.  Polysubstance abuse-counseled.  Also has alcohol abuse history.  Not recently.  Urine tox screen is negative this time  5.  DVT prophylaxis-teds and SCDs only  Patient is independent at baseline-PT consulted -Possible discharge once hemoglobin is stable after GI evaluation    All the records are reviewed and case discussed with Care Management/Social Workerr. Management plans discussed with the patient, family and they are in agreement.  CODE STATUS: Full code  TOTAL TIME TAKING CARE OF THIS PATIENT: 36 minutes.   POSSIBLE D/C IN 1-2 DAYS, DEPENDING ON CLINICAL CONDITION.   Gladstone Lighter M.D on 01/07/2019 at 1:49 PM  Between 7am to 6pm - Pager - [phone]  After 6pm go to [email or web address] - password [email or web address] [facility]  Office  [phone]  CC: Primary care physician; [facility]

## 2019-01-07 NOTE — Progress Notes (Signed)
[place]  [phone] [phone]  ID: Andrew Gibbs OB: Apr 25, 1956  MR#: [id number]  HEN#:[id number]  Patient Care Team: Center, [facility] as PCP - General (General Practice) Clent Jacks, RN as Registered Nurse  CHIEF COMPLAINT: Hepatocellular carcinoma, now with bony metastasis.  INTERVAL HISTORY: Patient resting in bed comfortably eating lunch during evaluation.  He currently does not complain of pain.  Plans to start palliative XRT to his thoracic spine tomorrow.  REVIEW OF SYSTEMS:   Review of Systems  Constitutional: Negative.  Negative for fever, malaise/fatigue and weight loss.  Respiratory: Negative.  Negative for cough, hemoptysis and shortness of breath.   Cardiovascular: Negative.  Negative for chest pain and leg swelling.  Gastrointestinal: Negative.  Negative for abdominal pain.  Genitourinary: Negative.  Negative for dysuria.  Musculoskeletal: Positive for back pain.  Skin: Negative.  Negative for rash.  Neurological: Negative.  Negative for dizziness, focal weakness, weakness and headaches.  Psychiatric/Behavioral: Negative.  The patient is not nervous/anxious.     As per HPI. Otherwise, a complete review of systems is negative.  PAST MEDICAL HISTORY: Past Medical History:  Diagnosis Date   Blood dyscrasia    thrombocytopenia   Hepatitis C    Heroin use     PAST SURGICAL HISTORY: Past Surgical History:  Procedure Laterality Date   ESOPHAGOGASTRODUODENOSCOPY N/A 01/06/2019   Procedure: ESOPHAGOGASTRODUODENOSCOPY (EGD);  Surgeon: Lucilla Lame, MD;  Location: [facility] ENDOSCOPY;  Service: Endoscopy;  Laterality: N/A;   ESOPHAGOGASTRODUODENOSCOPY (EGD) WITH PROPOFOL N/A 12/24/2018   Procedure: ESOPHAGOGASTRODUODENOSCOPY (EGD) WITH PROPOFOL;  Surgeon: Jonathon Bellows, MD;  Location: [facility] ENDOSCOPY;  Service: Gastroenterology;  Laterality: N/A;   IR ANGIOGRAM SELECTIVE EACH ADDITIONAL VESSEL  10/17/2018    IR ANGIOGRAM SELECTIVE EACH ADDITIONAL VESSEL  10/17/2018   IR ANGIOGRAM SELECTIVE EACH ADDITIONAL VESSEL  10/17/2018   IR ANGIOGRAM SELECTIVE EACH ADDITIONAL VESSEL  10/17/2018   IR ANGIOGRAM SELECTIVE EACH ADDITIONAL VESSEL  10/17/2018   IR ANGIOGRAM SELECTIVE EACH ADDITIONAL VESSEL  10/17/2018   IR ANGIOGRAM SELECTIVE EACH ADDITIONAL VESSEL  10/17/2018   IR ANGIOGRAM SELECTIVE EACH ADDITIONAL VESSEL  11/05/2018   IR ANGIOGRAM SELECTIVE EACH ADDITIONAL VESSEL  11/05/2018   IR ANGIOGRAM SELECTIVE EACH ADDITIONAL VESSEL  11/05/2018   IR ANGIOGRAM VISCERAL SELECTIVE  10/17/2018   IR ANGIOGRAM VISCERAL SELECTIVE  10/17/2018   IR ANGIOGRAM VISCERAL SELECTIVE  11/05/2018   IR EMBO ARTERIAL NOT HEMORR HEMANG INC GUIDE ROADMAPPING  10/17/2018   IR EMBO TUMOR ORGAN ISCHEMIA INFARCT INC GUIDE ROADMAPPING  11/05/2018   IR RADIOLOGIST EVAL & MGMT  05/22/2018   IR RADIOLOGIST EVAL & MGMT  08/27/2018   IR US GUIDE VASC ACCESS RIGHT  10/17/2018   IR US GUIDE VASC ACCESS RIGHT  11/05/2018   KNEE SURGERY     TEE WITHOUT CARDIOVERSION N/A 04/22/2018   Procedure: TRANSESOPHAGEAL ECHOCARDIOGRAM (TEE);  Surgeon: Teodoro Spray, MD;  Location: [facility] ORS;  Service: Cardiovascular;  Laterality: N/A;    FAMILY HISTORY: Family History  Problem Relation Age of Onset   Multiple myeloma Mother     ADVANCED DIRECTIVES (Y/N):  _0 @  HEALTH MAINTENANCE: Social History   Tobacco Use   Smoking status: Current Every Day Smoker    Packs/day: 0.50    Years: 40.00    Pack years: 20.00   Smokeless tobacco: Never Used  Substance Use Topics   Alcohol use: Not Currently    Comment: 40 oz today   Drug use: Not Currently  Types: IV, Cocaine, Heroin, Marijuana    Comment: heroin, states he used to use cocaine     Colonoscopy:  PAP:  Bone density:  Lipid panel:  No Known Allergies  Current Facility-Administered Medications  Medication Dose Route Frequency Provider Last Rate Last Dose   0.9 %  sodium  chloride infusion   Intravenous Continuous Lucilla Lame, MD 75 mL/hr at 01/07/19 0418     cefTRIAXone (ROCEPHIN) 2 g in sodium chloride 0.9 % 100 mL IVPB  2 g Intravenous Daily Lucilla Lame, MD 200 mL/hr at 01/07/19 0925 2 g at 05/39/76 7341   folic acid (FOLVITE) tablet 1 mg  1 mg Oral Daily Lucilla Lame, MD   1 mg at 01/07/19 0917   LORazepam (ATIVAN) injection 1 mg  1 mg Intravenous Q1H PRN Lucilla Lame, MD       Or   LORazepam (ATIVAN) tablet 1 mg  1 mg Oral Q6H PRN Lucilla Lame, MD       morphine 4 MG/ML injection 4 mg  4 mg Intravenous Q4H PRN Gladstone Lighter, MD   4 mg at 01/07/19 1449   multivitamin with minerals tablet 1 tablet  1 tablet Oral Daily Lucilla Lame, MD   1 tablet at 01/07/19 0918   ondansetron (ZOFRAN) tablet 4 mg  4 mg Oral Q6H PRN Lucilla Lame, MD       Or   ondansetron (ZOFRAN) injection 4 mg  4 mg Intravenous Q6H PRN Lucilla Lame, MD       orphenadrine (NORFLEX) injection 60 mg  60 mg Intramuscular Q12H Lucilla Lame, MD   60 mg at 01/07/19 9379   oxyCODONE (Oxy IR/ROXICODONE) immediate release tablet 5-10 mg  5-10 mg Oral Q6H PRN Gladstone Lighter, MD       pantoprazole (PROTONIX) EC tablet 40 mg  40 mg Oral Daily Gladstone Lighter, MD       polyethylene glycol-electrolytes (NuLYTELY/GoLYTELY) solution 4,000 mL  4,000 mL Oral Once Lucilla Lame, MD       potassium chloride SA (K-DUR) CR tablet 40 mEq  40 mEq Oral Once Gladstone Lighter, MD       sodium chloride flush (NS) 0.9 % injection 3 mL  3 mL Intravenous Q12H Lucilla Lame, MD   3 mL at 01/06/19 2112   thiamine (VITAMIN B-1) tablet 100 mg  100 mg Oral Daily Lucilla Lame, MD   100 mg at 01/07/19 0240   Or   thiamine (B-1) injection 100 mg  100 mg Intravenous Daily Lucilla Lame, MD   100 mg at 01/06/19 1246    OBJECTIVE: Vitals:   01/07/19 1453 01/07/19 1531  BP: 133/77 132/70  Pulse: 67 64  Resp: 14 15  Temp: 99.1 F (37.3 C) 98.9 F (37.2 C)  SpO2: 98% 100%     Body mass index is  20.03 kg/m.    ECOG FS:2 - Symptomatic, <50% confined to bed  General: Thin, no acute distress. Eyes: Pink conjunctiva, anicteric sclera. HEENT: Normocephalic, moist mucous membranes. Lungs: Clear to auscultation bilaterally. Heart: Regular rate and rhythm. No rubs, murmurs, or gallops. Abdomen: Soft, nontender, nondistended. No organomegaly noted, normoactive bowel sounds. Musculoskeletal: No edema, cyanosis, or clubbing. Neuro: Alert, answering all questions appropriately. Cranial nerves grossly intact. Skin: No rashes or petechiae noted. Psych: Normal affect.   LAB RESULTS:  Lab Results  Component Value Date   NA 137 01/07/2019   K 3.4 (L) 01/07/2019   CL 109 01/07/2019   CO2 20 (L) 01/07/2019   GLUCOSE  161 (H) 01/07/2019   BUN 14 01/07/2019   CREATININE 0.59 (L) 01/07/2019   CALCIUM 7.7 (L) 01/07/2019   PROT 7.0 01/03/2019   ALBUMIN 2.0 (L) 01/03/2019   AST 431 (H) 01/03/2019   ALT 97 (H) 01/03/2019   ALKPHOS 194 (H) 01/03/2019   BILITOT 2.8 (H) 01/03/2019   GFRNONAA >60 01/07/2019   GFRAA >60 01/07/2019    Lab Results  Component Value Date   WBC 8.1 01/07/2019   NEUTROABS 8.4 (H) 01/03/2019   HGB 7.3 (L) 01/07/2019   HCT 21.9 (L) 01/07/2019   MCV 96.5 01/07/2019   PLT 101 (L) 01/07/2019     STUDIES: Dg Chest 2 View  Result Date: 01/03/2019 CLINICAL DATA:  Low back pain for 2 months. History of hepatitis C, IV drug use, liver cancer. EXAM: CHEST - 2 VIEW COMPARISON:  Chest x-rays dated 04/17/2018 and 02/13/2018. FINDINGS: The heart size and mediastinal contours are within normal limits. Both lungs are clear. The visualized skeletal structures are unremarkable. IMPRESSION: No active cardiopulmonary disease. Electronically Signed   By: Franki Cabot M.D.   On: 01/03/2019 17:41   Mr Cervical Spine W Wo Contrast  Result Date: 01/05/2019 CLINICAL DATA:  Metastatic hepatocellular carcinoma with vertebral column metastases. Back pain and hematemesis. EXAM: MRI  TOTAL SPINE WITHOUT AND WITH CONTRAST TECHNIQUE: Multisequence MR imaging of the spine from the cervical spine to the sacrum was performed prior to and following IV contrast administration for evaluation of spinal metastatic disease. CONTRAST:  5 mL Gadavist COMPARISON:  Thoracic spine MRI 04/20/2018 FINDINGS: MRI CERVICAL SPINE FINDINGS Alignment: Straightening of normal lordosis.  No static subluxation. Vertebrae: There is collapse of the superior endplate of T1 without marrow edema. There is abnormal T1-weighted signal and contrast enhancement within the right posterior elements of C7, with edema and enhancement also of the left facet joint, with a less masslike appearance. There is a small enhancing lesion at C3. Cord: Normal signal and caliber.  No epidural abnormality. Posterior Fossa, vertebral arteries, paraspinal tissues: Posterior fossa is normal. Vertebral artery flow voids are normal. No prevertebral effusion. Disc levels: C2-3: Mild disc bulge without spinal canal or neural foraminal stenosis. C3-4: Intermediate disc osteophyte complex with severe right and mild left foraminal stenosis. Narrowing of the ventral thecal sac without spinal canal stenosis. C4-5: Small disc osteophyte complex with mild right foraminal narrowing. No central spinal canal stenosis. C5-6: Mild uncovertebral hypertrophy without spinal canal or neural foraminal stenosis. C6-7: Small disc bulge without spinal canal or neural foraminal stenosis. C7-T1: Severe narrowing of the disc space with subsidence of the inferior C7 endplate into the superior T1 endplate. As above, there is abnormal contrast enhancement of the posterior elements with a mass lesion in the right T1 lamina that extends into the spinous process. MRI THORACIC SPINE FINDINGS Alignment:  Physiologic. Vertebrae: There are multiple metastatic lesions within the thoracic spine: 1. T1 right posterior elements. 2. T2 vertebral body with mild superior endplate collapse. 3.  T4 left pedicle with encroachment on the T3-4 and T4-5 left neural foramina. 4. T8 vertebral body lesion occupying most of the vertebral body with early extension into the right pedicle and encroachment into the right T7-8 and T8-9 neural foramina. 5. T10 spinous process. Cord:  Normal signal and caliber. Paraspinal and other soft tissues: Unremarkable Disc levels: T3-4: Mild narrowing of the left neural foramen due to tumor within the left T4 pedicle. T4-5: Mild left foraminal stenosis due to left T4 pedicle tumor, contacting the exiting  left C4 nerve root. T7-8: Moderate right foraminal stenosis due to tumor encroachment from the expansile portion of the T8 tumor in the right pedicle. No spinal canal stenosis. T8-9: Mild encroachment into the right neural foramen from the T8 right pedicle tumor. MRI LUMBAR SPINE FINDINGS Segmentation:  Standard. Alignment:  Physiologic. Vertebrae: 1. Large tumor within the right L1 vertebral body extending into the posterior elements there is encroachment into the right lateral aspect of the thecal sac. 2. Bulky lesion in the right posterior aspect of the L3 vertebral body that encroaches on the right lateral aspect of the thecal sac. 3. Abnormal marrow signal in the left pedicle and posterior vertebral body of L4 without expansile lesion. 4. Nonexpansile left pedicle lesion of L5. 5. Small lesion at the dorsal aspect of S1. Conus medullaris: Extends to the L1 level and appears normal. Paraspinal and other soft tissues: The visualized vascular, retroperitoneal and paraspinal structures are normal. Disc levels: L1-2: Narrowing of the right subarticular recess by epidural tumor encroachment, also causing moderate right foraminal stenosis. No central spinal canal stenosis. L2-3: There is narrowing of the inferior right lateral recess due to tumor extension from the bulky L3 lesion, displacing the descending right L3 nerve root. There is no central spinal canal or neural foraminal  stenosis. L3-4: There is narrowing of the right lateral recess along the length of the L3 vertebral body due to tumor extension into the epidural space, displacing the descending right L3 and L4 nerve roots. There is no central spinal canal or neural foraminal stenosis. L4-5: No spinal canal or neural foraminal stenosis. L5-S1: No spinal canal or neural foraminal stenosis. IMPRESSION: 1. 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 2. Tumor encroachment on the left T3, left T4, right T7 and right T8 neural foramina. 3. Mild cervical metastatic disease without epidural encroachment. 4. No pathologic fracture. Electronically Signed   By: Ulyses Jarred M.D.   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 Back pain and hematemesis. EXAM: MRI TOTAL SPINE WITHOUT AND WITH CONTRAST TECHNIQUE: Multisequence MR imaging of the spine from the cervical spine to the sacrum was performed prior to and following IV contrast administration for evaluation of spinal metastatic disease. CONTRAST:  5 mL Gadavist COMPARISON:  Thoracic spine MRI 04/20/2018 FINDINGS: MRI CERVICAL SPINE FINDINGS Alignment: Straightening of normal lordosis.  No static subluxation. Vertebrae: There is collapse of the superior endplate of T1 without marrow edema. There is abnormal T1-weighted signal and contrast enhancement within the right posterior elements of C7, with edema and enhancement also of the left facet joint, with a less masslike appearance. There is a small enhancing lesion at C3. Cord: Normal signal and caliber.  No epidural abnormality. Posterior Fossa, vertebral arteries, paraspinal tissues: Posterior fossa is normal. Vertebral artery flow voids are normal.  No prevertebral effusion. Disc levels: C2-3: Mild disc bulge without spinal canal or neural foraminal stenosis. C3-4: Intermediate disc osteophyte complex with severe right and mild left foraminal stenosis. Narrowing of the ventral thecal sac without spinal canal stenosis. C4-5: Small disc osteophyte complex with mild right foraminal narrowing. No central spinal canal stenosis. C5-6: Mild uncovertebral hypertrophy without spinal canal or neural foraminal stenosis. C6-7: Small disc bulge without spinal canal or neural foraminal stenosis.  C7-T1: Severe narrowing of the disc space with subsidence of the inferior C7 endplate into the superior T1 endplate. As above, there is abnormal contrast enhancement of the posterior elements with a mass lesion in the right T1 lamina that extends into the spinous process. MRI THORACIC SPINE FINDINGS Alignment:  Physiologic. Vertebrae: There are multiple metastatic lesions within the thoracic spine: 1. T1 right posterior elements. 2. T2 vertebral body with mild superior endplate collapse. 3. T4 left pedicle with encroachment on the T3-4 and T4-5 left neural foramina. 4. T8 vertebral body lesion occupying most of the vertebral body with early extension into the right pedicle and encroachment into the right T7-8 and T8-9 neural foramina. 5. T10 spinous process. Cord:  Normal signal and caliber. Paraspinal and other soft tissues: Unremarkable Disc levels: T3-4: Mild narrowing of the left neural foramen due to tumor within the left T4 pedicle. T4-5: Mild left foraminal stenosis due to left T4 pedicle tumor, contacting the exiting left C4 nerve root. T7-8: Moderate right foraminal stenosis due to tumor encroachment from the expansile portion of the T8 tumor in the right pedicle. No spinal canal stenosis. T8-9: Mild encroachment into the right neural foramen from the T8 right pedicle tumor. MRI LUMBAR SPINE FINDINGS Segmentation:  Standard. Alignment:  Physiologic. Vertebrae: 1. Large tumor  within the right L1 vertebral body extending into the posterior elements there is encroachment into the right lateral aspect of the thecal sac. 2. Bulky lesion in the right posterior aspect of the L3 vertebral body that encroaches on the right lateral aspect of the thecal sac. 3. Abnormal marrow signal in the left pedicle and posterior vertebral body of L4 without expansile lesion. 4. Nonexpansile left pedicle lesion of L5. 5. Small lesion at the dorsal aspect of S1. Conus medullaris: Extends to the L1 level and appears normal. Paraspinal and other soft tissues: The visualized vascular, retroperitoneal and paraspinal structures are normal. Disc levels: L1-2: Narrowing of the right subarticular recess by epidural tumor encroachment, also causing moderate right foraminal stenosis. No central spinal canal stenosis. L2-3: There is narrowing of the inferior right lateral recess due to tumor extension from the bulky L3 lesion, displacing the descending right L3 nerve root. There is no central spinal canal or neural foraminal stenosis. L3-4: There is narrowing of the right lateral recess along the length of the L3 vertebral body due to tumor extension into the epidural space, displacing the descending right L3 and L4 nerve roots. There is no central spinal canal or neural foraminal stenosis. L4-5: No spinal canal or neural foraminal stenosis. L5-S1: No spinal canal or neural foraminal stenosis. IMPRESSION: 1. 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 2. Tumor encroachment on the left T3, left T4, right T7 and right T8 neural foramina. 3. Mild cervical metastatic disease without epidural encroachment. 4. No pathologic fracture. Electronically Signed   By: Ulyses Jarred M.D.   On: 01/05/2019 19:56   Mr Lumbar Spine W Wo Contrast  Result Date:  01/05/2019 CLINICAL DATA:  Metastatic hepatocellular carcinoma with vertebral column metastases. Back pain and hematemesis. EXAM: MRI TOTAL SPINE WITHOUT AND WITH CONTRAST TECHNIQUE: Multisequence MR imaging of the spine from the cervical spine to the sacrum was performed prior to and following IV contrast administration for evaluation of spinal metastatic disease. CONTRAST:  5 mL Gadavist COMPARISON:  Thoracic spine MRI 04/20/2018  FINDINGS: MRI CERVICAL SPINE FINDINGS Alignment: Straightening of normal lordosis.  No static subluxation. Vertebrae: There is collapse of the superior endplate of T1 without marrow edema. There is abnormal T1-weighted signal and contrast enhancement within the right posterior elements of C7, with edema and enhancement also of the left facet joint, with a less masslike appearance. There is a small enhancing lesion at C3. Cord: Normal signal and caliber.  No epidural abnormality. Posterior Fossa, vertebral arteries, paraspinal tissues: Posterior fossa is normal. Vertebral artery flow voids are normal. No prevertebral effusion. Disc levels: C2-3: Mild disc bulge without spinal canal or neural foraminal stenosis. C3-4: Intermediate disc osteophyte complex with severe right and mild left foraminal stenosis. Narrowing of the ventral thecal sac without spinal canal stenosis. C4-5: Small disc osteophyte complex with mild right foraminal narrowing. No central spinal canal stenosis. C5-6: Mild uncovertebral hypertrophy without spinal canal or neural foraminal stenosis. C6-7: Small disc bulge without spinal canal or neural foraminal stenosis. C7-T1: Severe narrowing of the disc space with subsidence of the inferior C7 endplate into the superior T1 endplate. As above, there is abnormal contrast enhancement of the posterior elements with a mass lesion in the right T1 lamina that extends into the spinous process. MRI THORACIC SPINE FINDINGS Alignment:  Physiologic. Vertebrae: There are multiple  metastatic lesions within the thoracic spine: 1. T1 right posterior elements. 2. T2 vertebral body with mild superior endplate collapse. 3. T4 left pedicle with encroachment on the T3-4 and T4-5 left neural foramina. 4. T8 vertebral body lesion occupying most of the vertebral body with early extension into the right pedicle and encroachment into the right T7-8 and T8-9 neural foramina. 5. T10 spinous process. Cord:  Normal signal and caliber. Paraspinal and other soft tissues: Unremarkable Disc levels: T3-4: Mild narrowing of the left neural foramen due to tumor within the left T4 pedicle. T4-5: Mild left foraminal stenosis due to left T4 pedicle tumor, contacting the exiting left C4 nerve root. T7-8: Moderate right foraminal stenosis due to tumor encroachment from the expansile portion of the T8 tumor in the right pedicle. No spinal canal stenosis. T8-9: Mild encroachment into the right neural foramen from the T8 right pedicle tumor. MRI LUMBAR SPINE FINDINGS Segmentation:  Standard. Alignment:  Physiologic. Vertebrae: 1. Large tumor within the right L1 vertebral body extending into the posterior elements there is encroachment into the right lateral aspect of the thecal sac. 2. Bulky lesion in the right posterior aspect of the L3 vertebral body that encroaches on the right lateral aspect of the thecal sac. 3. Abnormal marrow signal in the left pedicle and posterior vertebral body of L4 without expansile lesion. 4. Nonexpansile left pedicle lesion of L5. 5. Small lesion at the dorsal aspect of S1. Conus medullaris: Extends to the L1 level and appears normal. Paraspinal and other soft tissues: The visualized vascular, retroperitoneal and paraspinal structures are normal. Disc levels: L1-2: Narrowing of the right subarticular recess by epidural tumor encroachment, also causing moderate right foraminal stenosis. No central spinal canal stenosis. L2-3: There is narrowing of the inferior right lateral recess due to tumor  extension from the bulky L3 lesion, displacing the descending right L3 nerve root. There is no central spinal canal or neural foraminal stenosis. L3-4: There is narrowing of the right lateral recess along the length of the L3 vertebral body due to tumor extension into the epidural space, displacing the descending right L3 and L4 nerve roots. There is no central spinal canal or neural foraminal stenosis. L4-5:  No spinal canal or neural foraminal stenosis. L5-S1: No spinal canal or neural foraminal stenosis. IMPRESSION: 1. 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 2. Tumor encroachment on the left T3, left T4, right T7 and right T8 neural foramina. 3. Mild cervical metastatic disease without epidural encroachment. 4. No pathologic fracture. Electronically Signed   By: Ulyses Jarred M.D.   On: 01/05/2019 19:56   Ct Angio Chest/abd/pel For Dissection W And/or W/wo  Result Date: 01/03/2019 CLINICAL DATA:  Low back and generalized abdominal pain for 2 months. EXAM: CT ANGIOGRAPHY CHEST, ABDOMEN AND PELVIS TECHNIQUE: Multidetector CT imaging through the chest, abdomen and pelvis was performed using the standard protocol during bolus administration of intravenous contrast. Multiplanar reconstructed images and MIPs were obtained and reviewed to evaluate the vascular anatomy. CONTRAST:  100 mL ISOVUE-370 IOPAMIDOL (ISOVUE-370) INJECTION 76% COMPARISON:  CT chest 10/16/2018. CT angiogram abdomen and pelvis 08/27/2018. FINDINGS: CTA CHEST FINDINGS Cardiovascular: Preferential opacification of the thoracic aorta. No evidence of thoracic aortic aneurysm or dissection. Normal heart size. No pericardial effusion. Mediastinum/Nodes: No enlarged mediastinal, hilar, or axillary lymph nodes. Thyroid gland, trachea, and esophagus demonstrate no significant  findings. Lungs/Pleura: No pleural effusion. Lungs demonstrate mild dependent atelectasis. There is some emphysematous change in the apices. No nodule, mass or consolidative process. Musculoskeletal: Lytic lesions are seen and T2 and T3. Mild superior endplate compression fracture of T2 noted. Mixed lytic and sclerotic lesion is also present at T8. Paraspinous and epidural soft tissue tumor on the right are seen extending out of the T8 vertebral body. Tumor contacts the cord and extends into the right T7-8 foramen. There is a lytic lesion in the left T4 pedicle and medial margin of the left fourth rib. Review of the MIP images confirms the above findings. CTA ABDOMEN AND PELVIS FINDINGS VASCULAR Aorta: Normal caliber aorta without aneurysm, dissection, vasculitis or significant stenosis. Celiac: Patent without evidence of aneurysm, dissection, vasculitis or significant stenosis. SMA: Patent without evidence of aneurysm, dissection, vasculitis or significant stenosis. Renals: Both renal arteries are patent without evidence of aneurysm, dissection, vasculitis, fibromuscular dysplasia or significant stenosis. Small accessory renal arteries on the right and left noted. IMA: Patent without evidence of aneurysm, dissection, vasculitis or significant stenosis. Inflow: Patent without evidence of aneurysm, dissection, vasculitis or significant stenosis. Veins: No obvious venous abnormality within the limitations of this arterial phase study. Review of the MIP images confirms the above findings. NON-VASCULAR Hepatobiliary: The liver is markedly cirrhotic. Round lesion in the liver measuring 4.0 x 4.5 cm in the right hepatic lobe consistent with the patient's history of hepatocellular carcinoma is noted. 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 Biliary tree is unremarkable. Vascular coils in the porta hepatis are identified. Pancreas: Unremarkable. No pancreatic ductal dilatation or surrounding inflammatory changes. Spleen: No  focal lesion.  Splenomegaly. Adrenals/Urinary Tract: Renal cysts are noted. The kidneys otherwise appear normal. Ureters and urinary bladder are unremarkable. Adrenal glands appear normal. Stomach/Bowel: Stomach is within normal limits. Appendix appears normal. No evidence of bowel wall thickening, distention, or inflammatory changes. Lymphatic: No lymphadenopathy. Reproductive: Prostate is unremarkable. Other: No free or focal fluid collection. Musculoskeletal: There is a large lytic lesion and soft tissue mass in the posterior aspect of the right L1 vertebral body and pedicle. Tumor extends into the right L1-2 neural foramen and extends into the right epidural space. Second destructive  lesion is seen on the right at L3 where epidural tumor extends into the thecal sac and right L3-4 foramen. Smaller lytic lesions in L4, L5 and pelvis are noted. Review of the MIP images confirms the above findings. IMPRESSION: Negative for aortic dissection or aneurysm. No acute abnormality chest, abdomen or pelvis. Markedly cirrhotic liver with a mass consistent with the patient's known a hepatocellular carcinoma. 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 These findings could be better evaluated with MRI with and without contrast. Electronically Signed   By: Inge Rise M.D.   On: 01/03/2019 21:28    ASSESSMENT: Hepatocellular carcinoma, now with bony metastasis.  PLAN:    1. Hepatocellular carcinoma, now with bony metastasis: Appreciate radiation oncology input.  Plan to start palliative XRT tomorrow.  Continue steroids as ordered.  Unclear if patient will be able to tolerate systemic treatment.  Please ensure patient has follow-up in the cancer center within 1 week to further discuss treatment options.  AFP is currently pending. 2.  Pain: Continue current  narcotic regimen.  XRT as above. 3.  Possible GI bleed: Patient's hemoglobin is significantly decreased and is now 7.3.  EGD on January 06, 2019 did not reveal distinct etiology of bleeding.  Continue to monitor and maintain hemoglobin greater than 7.0. 4.  Thrombocytopenia: Mild, monitor.  Appreciate consult, will follow.    Lloyd Huger, MD   01/07/2019 4:28 PM

## 2019-01-07 NOTE — Progress Notes (Signed)
Andrew Lame, MD [facility]   [address] [place], [place] [address] Phone: [phone] Fax : [phone]   Subjective: The patient had an EGD yesterday without any source of his low blood count.  The patient has never had a colonoscopy in the past.  Although he had esophageal varices with some portal hypertensive gastropathy on the EGD there was no active bleeding.   Objective: Vital signs in last 24 hours: Vitals:   01/06/19 2009 01/07/19 0415 01/07/19 1306 01/07/19 1453  BP: (!) 142/75 131/64 137/73 133/77  Pulse: 78 74 (!) 58 67  Resp: 20 20  14   Temp: 98.5 F (36.9 C) 98.9 F (37.2 C) 98.4 F (36.9 C) 99.1 F (37.3 C)  TempSrc: Oral Oral Oral Oral  SpO2: 100% 98% 100% 98%  Weight:      Height:       Weight change:   Intake/Output Summary (Last 24 hours) at 01/07/2019 1500 Last data filed at 01/07/2019 1357 Gross per 24 hour  Intake 840 ml  Output 820 ml  Net 20 ml     Exam: Heart:: Regular rate and rhythm, S1S2 present or without murmur or extra heart sounds Lungs: normal and clear to auscultation and percussion Abdomen: soft, nontender, normal bowel sounds   Lab Results: @LABTEST2 @ Micro Results: Recent Results (from the past 240 hour(s))  SARS Coronavirus 2 (CEPHEID - Performed in [place] hospital lab), Hosp Order     Status: None   Collection Time: 01/04/19 10:27 AM   Specimen: 1-NP & 1-OP swab, 1-sputum (productive cough); Nasopharyngeal  Result Value Ref Range Status   SARS Coronavirus 2 NEGATIVE NEGATIVE Final    Comment: (NOTE) If result is NEGATIVE SARS-CoV-2 target nucleic acids are NOT DETECTED. The SARS-CoV-2 RNA is generally detectable in upper and lower  respiratory specimens during the acute phase of infection. The lowest  concentration of SARS-CoV-2 viral copies this assay can detect is 250  copies / mL. A negative result does not preclude SARS-CoV-2 infection  and should not be used as the sole basis for treatment or other    patient management decisions.  A negative result may occur with  improper specimen collection / handling, submission of specimen other  than nasopharyngeal swab, presence of viral mutation(s) within the  areas targeted by this assay, and inadequate number of viral copies  (<250 copies / mL). A negative result must be combined with clinical  observations, patient history, and epidemiological information. If result is POSITIVE SARS-CoV-2 target nucleic acids are DETECTED. The SARS-CoV-2 RNA is generally detectable in upper and lower  respiratory specimens dur ing the acute phase of infection.  Positive  results are indicative of active infection with SARS-CoV-2.  Clinical  correlation with patient history and other diagnostic information is  necessary to determine patient infection status.  Positive results do  not rule out bacterial infection or co-infection with other viruses. If result is PRESUMPTIVE POSTIVE SARS-CoV-2 nucleic acids MAY BE PRESENT.   A presumptive positive result was obtained on the submitted specimen  and confirmed on repeat testing.  While 2019 novel coronavirus  (SARS-CoV-2) nucleic acids may be present in the submitted sample  additional confirmatory testing may be necessary for epidemiological  and / or clinical management purposes  to differentiate between  SARS-CoV-2 and other Sarbecovirus currently known to infect humans.  If clinically indicated additional testing with an alternate test  methodology [phone]) is advised. The SARS-CoV-2 RNA is generally  detectable in upper and lower respiratory  sp ecimens during the acute  phase of infection. The expected result is Negative. Fact Sheet for Patients:  StrictlyIdeas.no Fact Sheet for Healthcare Providers: BankingDealers.co.za This test is not yet approved or cleared by the [place] FDA and has been authorized for detection and/or diagnosis of SARS-CoV-2  by FDA under an Emergency Use Authorization (EUA).  This EUA will remain in effect (meaning this test can be used) for the duration of the COVID-19 declaration under Section 564(b)(1) of the Act, 21 U.S.C. section 360bbb-3(b)(1), unless the authorization is terminated or revoked sooner. Performed at [facility], [address] [place], [place] [address]    Studies/Results: Mr Cervical Spine W Wo Contrast  Result Date: 01/05/2019 CLINICAL DATA:  Metastatic hepatocellular carcinoma with vertebral column metastases. Back pain and hematemesis. EXAM: MRI TOTAL SPINE WITHOUT AND WITH CONTRAST TECHNIQUE: Multisequence MR imaging of the spine from the cervical spine to the sacrum was performed prior to and following IV contrast administration for evaluation of spinal metastatic disease. CONTRAST:  5 mL Gadavist COMPARISON:  Thoracic spine MRI 04/20/2018 FINDINGS: MRI CERVICAL SPINE FINDINGS Alignment: Straightening of normal lordosis.  No static subluxation. Vertebrae: There is collapse of the superior endplate of T1 without marrow edema. There is abnormal T1-weighted signal and contrast enhancement within the right posterior elements of C7, with edema and enhancement also of the left facet joint, with a less masslike appearance. There is a small enhancing lesion at C3. Cord: Normal signal and caliber.  No epidural abnormality. Posterior Fossa, vertebral arteries, paraspinal tissues: Posterior fossa is normal. Vertebral artery flow voids are normal. No prevertebral effusion. Disc levels: C2-3: Mild disc bulge without spinal canal or neural foraminal stenosis. C3-4: Intermediate disc osteophyte complex with severe right and mild left foraminal stenosis. Narrowing of the ventral thecal sac without spinal canal stenosis. C4-5: Small disc osteophyte complex with mild right foraminal narrowing. No central spinal canal stenosis. C5-6: Mild uncovertebral hypertrophy without spinal canal or neural foraminal  stenosis. C6-7: Small disc bulge without spinal canal or neural foraminal stenosis. C7-T1: Severe narrowing of the disc space with subsidence of the inferior C7 endplate into the superior T1 endplate. As above, there is abnormal contrast enhancement of the posterior elements with a mass lesion in the right T1 lamina that extends into the spinous process. MRI THORACIC SPINE FINDINGS Alignment:  Physiologic. Vertebrae: There are multiple metastatic lesions within the thoracic spine: 1. T1 right posterior elements. 2. T2 vertebral body with mild superior endplate collapse. 3. T4 left pedicle with encroachment on the T3-4 and T4-5 left neural foramina. 4. T8 vertebral body lesion occupying most of the vertebral body with early extension into the right pedicle and encroachment into the right T7-8 and T8-9 neural foramina. 5. T10 spinous process. Cord:  Normal signal and caliber. Paraspinal and other soft tissues: Unremarkable Disc levels: T3-4: Mild narrowing of the left neural foramen due to tumor within the left T4 pedicle. T4-5: Mild left foraminal stenosis due to left T4 pedicle tumor, contacting the exiting left C4 nerve root. T7-8: Moderate right foraminal stenosis due to tumor encroachment from the expansile portion of the T8 tumor in the right pedicle. No spinal canal stenosis. T8-9: Mild encroachment into the right neural foramen from the T8 right pedicle tumor. MRI LUMBAR SPINE FINDINGS Segmentation:  Standard. Alignment:  Physiologic. Vertebrae: 1. Large tumor within the right L1 vertebral body extending into the posterior elements there is encroachment into the right lateral aspect of the thecal sac. 2. Bulky lesion  in the right posterior aspect of the L3 vertebral body that encroaches on the right lateral aspect of the thecal sac. 3. Abnormal marrow signal in the left pedicle and posterior vertebral body of L4 without expansile lesion. 4. Nonexpansile left pedicle lesion of L5. 5. Small lesion at the dorsal  aspect of S1. Conus medullaris: Extends to the L1 level and appears normal. Paraspinal and other soft tissues: The visualized vascular, retroperitoneal and paraspinal structures are normal. Disc levels: L1-2: Narrowing of the right subarticular recess by epidural tumor encroachment, also causing moderate right foraminal stenosis. No central spinal canal stenosis. L2-3: There is narrowing of the inferior right lateral recess due to tumor extension from the bulky L3 lesion, displacing the descending right L3 nerve root. There is no central spinal canal or neural foraminal stenosis. L3-4: There is narrowing of the right lateral recess along the length of the L3 vertebral body due to tumor extension into the epidural space, displacing the descending right L3 and L4 nerve roots. There is no central spinal canal or neural foraminal stenosis. L4-5: No spinal canal or neural foraminal stenosis. L5-S1: No spinal canal or neural foraminal stenosis. IMPRESSION: 1. 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 2. Tumor encroachment on the left T3, left T4, right T7 and right T8 neural foramina. 3. Mild cervical metastatic disease without epidural encroachment. 4. No pathologic fracture. Electronically Signed   By: Ulyses Jarred M.D.   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 Back pain and hematemesis. EXAM: MRI TOTAL SPINE WITHOUT AND WITH CONTRAST TECHNIQUE: Multisequence MR imaging of the spine from the cervical spine to the sacrum was performed prior to and following IV contrast administration for evaluation of spinal metastatic disease. CONTRAST:  5 mL Gadavist COMPARISON:  Thoracic spine MRI 04/20/2018 FINDINGS: MRI CERVICAL SPINE  FINDINGS Alignment: Straightening of normal lordosis.  No static subluxation. Vertebrae: There is collapse of the superior endplate of T1 without marrow edema. There is abnormal T1-weighted signal and contrast enhancement within the right posterior elements of C7, with edema and enhancement also of the left facet joint, with a less masslike appearance. There is a small enhancing lesion at C3. Cord: Normal signal and caliber.  No epidural abnormality. Posterior Fossa, vertebral arteries, paraspinal tissues: Posterior fossa is normal. Vertebral artery flow voids are normal. No prevertebral effusion. Disc levels: C2-3: Mild disc bulge without spinal canal or neural foraminal stenosis. C3-4: Intermediate disc osteophyte complex with severe right and mild left foraminal stenosis. Narrowing of the ventral thecal sac without spinal canal stenosis. C4-5: Small disc osteophyte complex with mild right foraminal narrowing. No central spinal canal stenosis. C5-6: Mild uncovertebral hypertrophy without spinal canal or neural foraminal stenosis. C6-7: Small disc bulge without spinal canal or neural foraminal stenosis. C7-T1: Severe narrowing of the disc space with subsidence of the inferior C7 endplate into the superior T1 endplate. As above, there is abnormal contrast enhancement of the posterior elements with a mass lesion in the right T1 lamina that extends into the spinous process. MRI THORACIC SPINE FINDINGS Alignment:  Physiologic. Vertebrae: There are multiple metastatic lesions within the thoracic spine: 1. T1 right posterior elements. 2. T2 vertebral body with mild superior endplate collapse. 3. T4 left pedicle with encroachment on  the T3-4 and T4-5 left neural foramina. 4. T8 vertebral body lesion occupying most of the vertebral body with early extension into the right pedicle and encroachment into the right T7-8 and T8-9 neural foramina. 5. T10 spinous process. Cord:  Normal signal and caliber. Paraspinal and other  soft tissues: Unremarkable Disc levels: T3-4: Mild narrowing of the left neural foramen due to tumor within the left T4 pedicle. T4-5: Mild left foraminal stenosis due to left T4 pedicle tumor, contacting the exiting left C4 nerve root. T7-8: Moderate right foraminal stenosis due to tumor encroachment from the expansile portion of the T8 tumor in the right pedicle. No spinal canal stenosis. T8-9: Mild encroachment into the right neural foramen from the T8 right pedicle tumor. MRI LUMBAR SPINE FINDINGS Segmentation:  Standard. Alignment:  Physiologic. Vertebrae: 1. Large tumor within the right L1 vertebral body extending into the posterior elements there is encroachment into the right lateral aspect of the thecal sac. 2. Bulky lesion in the right posterior aspect of the L3 vertebral body that encroaches on the right lateral aspect of the thecal sac. 3. Abnormal marrow signal in the left pedicle and posterior vertebral body of L4 without expansile lesion. 4. Nonexpansile left pedicle lesion of L5. 5. Small lesion at the dorsal aspect of S1. Conus medullaris: Extends to the L1 level and appears normal. Paraspinal and other soft tissues: The visualized vascular, retroperitoneal and paraspinal structures are normal. Disc levels: L1-2: Narrowing of the right subarticular recess by epidural tumor encroachment, also causing moderate right foraminal stenosis. No central spinal canal stenosis. L2-3: There is narrowing of the inferior right lateral recess due to tumor extension from the bulky L3 lesion, displacing the descending right L3 nerve root. There is no central spinal canal or neural foraminal stenosis. L3-4: There is narrowing of the right lateral recess along the length of the L3 vertebral body due to tumor extension into the epidural space, displacing the descending right L3 and L4 nerve roots. There is no central spinal canal or neural foraminal stenosis. L4-5: No spinal canal or neural foraminal stenosis. L5-S1:  No spinal canal or neural foraminal stenosis. IMPRESSION: 1. 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 2. Tumor encroachment on the left T3, left T4, right T7 and right T8 neural foramina. 3. Mild cervical metastatic disease without epidural encroachment. 4. No pathologic fracture. Electronically Signed   By: Ulyses Jarred M.D.   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 Back pain and hematemesis. EXAM: MRI TOTAL SPINE WITHOUT AND WITH CONTRAST TECHNIQUE: Multisequence MR imaging of the spine from the cervical spine to the sacrum was performed prior to and following IV contrast administration for evaluation of spinal metastatic disease. CONTRAST:  5 mL Gadavist COMPARISON:  Thoracic spine MRI 04/20/2018 FINDINGS: MRI CERVICAL SPINE FINDINGS Alignment: Straightening of normal lordosis.  No static subluxation. Vertebrae: There is collapse of the superior endplate of T1 without marrow edema. There is abnormal T1-weighted signal and contrast enhancement within the right posterior elements of C7, with edema and enhancement also of the left facet joint, with a less masslike appearance. There is a small enhancing lesion at C3. Cord: Normal signal and caliber.  No epidural abnormality. Posterior Fossa, vertebral arteries, paraspinal tissues: Posterior fossa is  normal. Vertebral artery flow voids are normal. No prevertebral effusion. Disc levels: C2-3: Mild disc bulge without spinal canal or neural foraminal stenosis. C3-4: Intermediate disc osteophyte complex with severe right and mild left foraminal stenosis. Narrowing of the ventral thecal sac without spinal canal stenosis. C4-5: Small disc osteophyte complex with mild right  foraminal narrowing. No central spinal canal stenosis. C5-6: Mild uncovertebral hypertrophy without spinal canal or neural foraminal stenosis. C6-7: Small disc bulge without spinal canal or neural foraminal stenosis. C7-T1: Severe narrowing of the disc space with subsidence of the inferior C7 endplate into the superior T1 endplate. As above, there is abnormal contrast enhancement of the posterior elements with a mass lesion in the right T1 lamina that extends into the spinous process. MRI THORACIC SPINE FINDINGS Alignment:  Physiologic. Vertebrae: There are multiple metastatic lesions within the thoracic spine: 1. T1 right posterior elements. 2. T2 vertebral body with mild superior endplate collapse. 3. T4 left pedicle with encroachment on the T3-4 and T4-5 left neural foramina. 4. T8 vertebral body lesion occupying most of the vertebral body with early extension into the right pedicle and encroachment into the right T7-8 and T8-9 neural foramina. 5. T10 spinous process. Cord:  Normal signal and caliber. Paraspinal and other soft tissues: Unremarkable Disc levels: T3-4: Mild narrowing of the left neural foramen due to tumor within the left T4 pedicle. T4-5: Mild left foraminal stenosis due to left T4 pedicle tumor, contacting the exiting left C4 nerve root. T7-8: Moderate right foraminal stenosis due to tumor encroachment from the expansile portion of the T8 tumor in the right pedicle. No spinal canal stenosis. T8-9: Mild encroachment into the right neural foramen from the T8 right pedicle tumor. MRI LUMBAR SPINE FINDINGS Segmentation:  Standard. Alignment:  Physiologic. Vertebrae: 1. Large tumor within the right L1 vertebral body extending into the posterior elements there is encroachment into the right lateral aspect of the thecal sac. 2. Bulky lesion in the right posterior aspect of the L3 vertebral body that encroaches on the right lateral aspect of the thecal sac. 3. Abnormal marrow signal in the left pedicle  and posterior vertebral body of L4 without expansile lesion. 4. Nonexpansile left pedicle lesion of L5. 5. Small lesion at the dorsal aspect of S1. Conus medullaris: Extends to the L1 level and appears normal. Paraspinal and other soft tissues: The visualized vascular, retroperitoneal and paraspinal structures are normal. Disc levels: L1-2: Narrowing of the right subarticular recess by epidural tumor encroachment, also causing moderate right foraminal stenosis. No central spinal canal stenosis. L2-3: There is narrowing of the inferior right lateral recess due to tumor extension from the bulky L3 lesion, displacing the descending right L3 nerve root. There is no central spinal canal or neural foraminal stenosis. L3-4: There is narrowing of the right lateral recess along the length of the L3 vertebral body due to tumor extension into the epidural space, displacing the descending right L3 and L4 nerve roots. There is no central spinal canal or neural foraminal stenosis. L4-5: No spinal canal or neural foraminal stenosis. L5-S1: No spinal canal or neural foraminal stenosis. IMPRESSION: 1. 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 2. Tumor encroachment on the left T3, left T4, right T7 and right T8 neural foramina. 3. Mild cervical metastatic disease without epidural encroachment. 4. No pathologic fracture. Electronically Signed   By:  Ulyses Jarred M.D.   On: 01/05/2019 19:56   Medications: I have reviewed the patient's current medications. Scheduled Meds:  folic acid  1 mg Oral Daily   multivitamin with minerals  1 tablet Oral Daily   orphenadrine  60 mg Intramuscular Q12H   pantoprazole  40 mg Oral Daily   polyethylene glycol-electrolytes  4,000 mL Oral Once   potassium chloride  40 mEq Oral Once   sodium chloride flush  3 mL  Intravenous Q12H   thiamine  100 mg Oral Daily   Or   thiamine  100 mg Intravenous Daily   Continuous Infusions:  sodium chloride 75 mL/hr at 01/07/19 0418   cefTRIAXone (ROCEPHIN)  IV 2 g (01/07/19 0925)   PRN Meds:.LORazepam **OR** LORazepam, morphine, ondansetron **OR** ondansetron (ZOFRAN) IV, oxyCODONE   Assessment: Active Problems:   GI bleed   Hematemesis with nausea    Plan: This patient has anemia with continued dropping of his hemoglobin without any overt sign of bleeding.  The patient did have some hematemesis in the ER but did not have any further hematemesis since admission and his EGD did not have any old or new blood seen in it.  The patient will be set up for a colonoscopy for tomorrow since he has never had a colonoscopy in the past.  The patient will be given a prep today.  The patient has been explained the plan and agrees with it.   LOS: 4 days   Indyah Saulnier 01/07/2019, 3:00 PM

## 2019-01-08 ENCOUNTER — Encounter
Admission: EM | Disposition: A | Discharge status: Home Health | Payer: Self-pay | Source: Home / Self Care | Attending: Internal Medicine

## 2019-01-08 ENCOUNTER — Encounter: Payer: Self-pay | Admitting: Anesthesiology

## 2019-01-08 ENCOUNTER — Ambulatory Visit: Payer: Medicare Other

## 2019-01-08 ENCOUNTER — Telehealth: Payer: Self-pay | Admitting: *Deleted

## 2019-01-08 ENCOUNTER — Inpatient Hospital Stay: Payer: Medicare Other

## 2019-01-08 LAB — CBC
HCT: 28.5 % — ABNORMAL LOW (ref 39.0–52.0)
Hemoglobin: 9.7 g/dL — ABNORMAL LOW (ref 13.0–17.0)
MCH: 31.2 pg (ref 26.0–34.0)
MCHC: 34 g/dL (ref 30.0–36.0)
MCV: 91.6 fL (ref 80.0–100.0)
Platelets: 116 10*3/uL — ABNORMAL LOW (ref 150–400)
RBC: 3.11 MIL/uL — ABNORMAL LOW (ref 4.22–5.81)
RDW: 20.2 % — ABNORMAL HIGH (ref 11.5–15.5)
WBC: 9.5 10*3/uL (ref 4.0–10.5)
nRBC: 0.2 % (ref 0.0–0.2)

## 2019-01-08 LAB — TYPE AND SCREEN
ABO/RH(D): O POS
Antibody Screen: NEGATIVE
Unit division: 0

## 2019-01-08 LAB — BPAM RBC
Blood Product Expiration Date: 202007172359
ISSUE DATE / TIME: 202006231503
Unit Type and Rh: 5100

## 2019-01-08 LAB — BASIC METABOLIC PANEL WITH GFR
Anion gap: 7 (ref 5–15)
BUN: 13 mg/dL (ref 8–23)
CO2: 19 mmol/L — ABNORMAL LOW (ref 22–32)
Calcium: 7.7 mg/dL — ABNORMAL LOW (ref 8.9–10.3)
Chloride: 107 mmol/L (ref 98–111)
Creatinine, Ser: 0.51 mg/dL — ABNORMAL LOW (ref 0.61–1.24)
GFR calc Af Amer: >60 mL/min (ref >60)
GFR calc non Af Amer: >60 mL/min (ref >60)
Glucose, Bld: 111 mg/dL — ABNORMAL HIGH (ref 70–99)
Potassium: 3.4 mmol/L — ABNORMAL LOW (ref 3.5–5.1)
Sodium: 133 mmol/L — ABNORMAL LOW (ref 135–145)

## 2019-01-08 LAB — AFP TUMOR MARKER: AFP, Serum, Tumor Marker: 78593 ng/mL — ABNORMAL HIGH (ref 0.0–8.3)

## 2019-01-08 LAB — AMMONIA: Ammonia: 91 umol/L — ABNORMAL HIGH (ref 9–35)

## 2019-01-08 SURGERY — COLONOSCOPY WITH PROPOFOL
Anesthesia: General

## 2019-01-08 MED ORDER — MORPHINE SULFATE (PF) 4 MG/ML IV SOLN: 2.0000 mg | INTRAVENOUS | Status: AC | PRN

## 2019-01-08 MED ORDER — SODIUM CHLORIDE 0.9 % IV SOLN
12.5000 mg | Freq: Four times a day (QID) | INTRAVENOUS | Status: DC | PRN
  Administered 2019-01-08 – 2019-01-10 (×3): 12.5 mg via INTRAVENOUS
  Filled 2019-01-08 (×4): qty 0.5

## 2019-01-08 MED ORDER — SODIUM CHLORIDE 0.9 % IV SOLN: INTRAVENOUS | Status: DC

## 2019-01-08 MED ORDER — PANTOPRAZOLE SODIUM 40 MG IV SOLR
40.0000 mg | Freq: Every day | INTRAVENOUS | Status: DC
  Administered 2019-01-08 – 2019-01-13 (×6): 40 mg via INTRAVENOUS
  Filled 2019-01-08 (×6): qty 40

## 2019-01-08 NOTE — Progress Notes (Signed)
[place] patient sister Marcie Bal at [phone] and updated about patient's liver cancer now is in the bones.  She told me that she is in the process of talking to Dr. Donella Stade.  Patient is altered today, decrease the dose of morphine, will order MRI of the brain also check ammonia level.

## 2019-01-08 NOTE — Progress Notes (Signed)
[facility] at [place] NAME: Andrew Gibbs    MR#:  [id number]  DATE OF BIRTH:  [birth year]  SUBJECTIVE: Confused.  Unable to drink colonoscopy preparation because of confusion, colonoscopy canceled.  CHIEF COMPLAINT:   Chief Complaint  Patient presents with  . Back Pain   -   REVIEW OF SYSTEMS:  Unable to obtain review of systems because he is very confused  DRUG ALLERGIES:  No Known Allergies  VITALS:  Blood pressure (!) 159/90, pulse 86, temperature 98.6 F (37 C), temperature source Oral, resp. rate 20, height 5\' 7"  (1.702 m), weight 58 kg, SpO2 100 %.  PHYSICAL EXAMINATION:  Physical Exam   GENERAL:  63 y.o.-year-old disheveled appearing patient lying in the bed , very confused EYES: Pupils equal, round, reactive to light  HEENT: Head atraumatic, normocephalic. Oropharynx and nasopharynx clear.  NECK:  Supple, no jugular venous distention. No thyroid enlargement, no tenderness.  LUNGS: Normal breath sounds bilaterally, no wheezing, rales,rhonchi or crepitation. No use of accessory muscles of respiration.  Decreased bibasilar breath sound CARDIOVASCULAR: S1, S2 normal. No murmurs, rubs, or gallops.  ABDOMEN: Soft, generalized discomfort without guarding or rigidity nondistended. Bowel sounds present. No organomegaly or mass.  EXTREMITIES: No pedal edema, cyanosis, or clubbing.  NEUROLOGIC: Unable to do full neuro exam because patient is very confused  pSYCHIATRIC: very confused  sKIN: No obvious rash, lesion, or ulcer.    LABORATORY PANEL:   CBC Recent Labs  Lab 01/08/19 0336  WBC 9.5  HGB 9.7*  HCT 28.5*  PLT 116*   ------------------------------------------------------------------------------------------------------------------  Chemistries  Recent Labs  Lab 01/03/19 1503  01/08/19 0336  NA 137   < > 133*  K 5.4*   < > 3.4*  CL 108   < > 107  CO2 18*   < > 19*  GLUCOSE 101*   < > 111*  BUN 20   < > 13   CREATININE 0.52*   < > 0.51*  CALCIUM 8.3*   < > 7.7*  AST 431*  --   --   ALT 97*  --   --   ALKPHOS 194*  --   --   BILITOT 2.8*  --   --    < > = values in this interval not displayed.   ------------------------------------------------------------------------------------------------------------------  Cardiac Enzymes No results for input(s): TROPONINI in the last 168 hours. ------------------------------------------------------------------------------------------------------------------  RADIOLOGY:  No results found.  EKG:   Orders placed or performed during the hospital encounter of 01/03/19  . ED EKG  . ED EKG  . EKG 12-Lead  . EKG 12-Lead    ASSESSMENT AND PLAN:   63 year old male with past medical history significant for hepatitis C, hepatocellular carcinoma, alcohol abuse, liver cirrhosis, history of IV drug abuse who had a recent upper endoscopy done for esophageal varices comes to the hospital secondary to lower back pain and noted to have an episode of hematemesis  1.  Upper GI bleed-had hematemesis on admission.  Received 1 unit prbc transfusion this adm, hemoglobin stable around 9.7 today.-Discussed 2 units of blood transfusion this admission.- recent EGD 2 weeks ago with grade 1 esophageal varices, repeat EGD this adm showing the same -No active bleeding noted.  DC'd octreotide drip.  Change Protonix to oral -Since hemoglobin dropping, colonoscopy is being planned.   but patient could not drink colonoscopy preparation so colonoscopy canceled. 2.  Hepatocellular carcinoma now with bony mets, patient needs palliative radiation therapy,  seen by oncology, palliative radiation therapy planned for today. -MRI of the whole spine showing significant metastatic disease, much worse in the lumbar region. -Appreciate oncology consult.  Radiation oncology consult appreciated -Patient had simulation and marking done today.  Plan to start radiation therapy to the lower back later  next week.  Social worker consulted for transportation issues after discharge. -Pain medications for now.  on IV Decadron. -PT consult requested  3.  Hepatocellular carcinoma-based on MRI of the abdomen in October 2019 showing 4 cm liver mass.  Not a candidate for liver transplant.  Underwent ablation in April 2020 with some improvement in abdominal pain. Pre-ablation AFP fevers greater than 33,000.  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  Polysubstance abuse-counseled.  Also has alcohol abuse history.  Not recently.  Urine tox screen is negative this time  5.  DVT prophylaxis-teds and SCDs only  Lethargy likely due to combination of pain medicines, decrease the dose of morphine, continue IV fluids as patient is confused now.      All the records are reviewed and case discussed with Care Management/Social Workerr. Management plans discussed with the patient, family and they are in agreement.  CODE STATUS: Full code  TOTAL TIME TAKING CARE OF THIS PATIENT: 36 minutes.   POSSIBLE D/C IN 1-2 DAYS, DEPENDING ON CLINICAL CONDITION.   Epifanio Lesches M.D on 01/08/2019 at 1:00 PM  Between 7am to 6pm - Pager - [phone]  After 6pm go to [email or web address] - password [email or web address] [facility]  Office  [phone]  CC: Primary care physician; Center, [facility]

## 2019-01-08 NOTE — Progress Notes (Signed)
PT Cancellation Note  Patient Details Name: TEJ MURDAUGH MRN: [id number] DOB: 05/15/1956   Cancelled Treatment:    Reason Eval/Treat Not Completed: Patient's level of consciousness. Discussed case with RN, decided to hold on PT evaluation this date as patient is quite confused and unable to fully participate in therapy evaluation. Will re-attempt as patient is more able to fully participate.    Royce Macadamia PT, DPT, CSCS   01/08/2019, 2:51 PM

## 2019-01-08 NOTE — Care Management Important Message (Signed)
Important Message  Patient Details  Name: Andrew Gibbs MRN: [id number] Date of Birth: 1956-06-08   Medicare Important Message Given:  Yes     [name] 01/08/2019, 10:58 AM

## 2019-01-08 NOTE — Progress Notes (Signed)
[facility] number is  [phone].

## 2019-01-08 NOTE — Telephone Encounter (Signed)
Call returned.

## 2019-01-08 NOTE — Plan of Care (Signed)
Pt colonoscopy procedure was  cancelled today due to improper bowel prep.

## 2019-01-08 NOTE — Progress Notes (Signed)
Initial Nutrition Assessment  DOCUMENTATION CODES:   Not applicable  INTERVENTION:   RD will add supplements once diet advanced  Pt at high refeed risk as he has been on NPO/clear liquid diet > 5 days  MVI, thiamine and folic acid in setting of etoh abuse  NUTRITION DIAGNOSIS:   Increased nutrient needs related to chronic illness(Liver cancer, Hep C) as evidenced by increased estimated needs.  GOAL:   Patient will meet greater than or equal to 90% of their needs  MONITOR:   PO intake, Supplement acceptance, Labs, Weight trends, Skin, I & O's  REASON FOR ASSESSMENT:   NPO/Clear Liquid Diet    ASSESSMENT:   63 year old male with past medical history significant for hepatitis C, hepatocellular carcinoma, alcohol abuse, liver cirrhosis, history of IV drug abuse who had a recent upper endoscopy done for esophageal varices 6/9 comes to the hospital secondary to lower back pain secondary to spine metastasis and noted to have an episode of hematemesis   Pt s/p EGD 6/22; found to have esophageal varices and hypertensive gastropathy   RD working remotely.  Suspect pt with poor appetite and oral intake at baseline r/t substance abuse and liver carcinoma. Per chart review, pt has been on NPO/clear liquid diet since admit; pt now without adequate nutrition for 6 days. Pt eating 50% of clear liquid diet yesterday prior to NPO. Pt scheduled to have colonoscopy and XRT today. RD will add supplements once diet advanced. Pt at high refeed risk; recommend monitor K, Mg and P labs daily until stable. Per chart, pt has been weight stable for the past year.  Pt at high risk for malnutrition but unable to diagnose at this time as NFPE cannot be performed.   Medications reviewed and include: folic acid, MVI, protonix, thiamine, NaCl @75ml /hr, ceftriaxone    Labs reviewed: Na 133(L), K 3.4(L), creat 0.51(L) Iron 41(L), TIBC 255, ferritin 338, folate 9.7, B12 1370- 6/23 Hgb 9.7(L), Hct  28.5(L)  Unable to complete Nutrition-Focused physical exam at this time.   Diet Order:   Diet Order            Diet NPO time specified  Diet effective midnight             EDUCATION NEEDS:   No education needs have been identified at this time  Skin:  Skin Assessment: Reviewed RN Assessment  Last BM:  6/20- type 6  Height:   Ht Readings from Last 1 Encounters:  01/03/19 5\' 7"  (1.702 m)    Weight:   Wt Readings from Last 1 Encounters:  01/03/19 58 kg    Ideal Body Weight:  67 kg  BMI:  Body mass index is 20.03 kg/m.  Estimated Nutritional Needs:   Kcal:  1700-2000kcal/day  Protein:  87-100g/day  Fluid:  >1.7L/day  Koleen Distance MS, RD, LDN Pager #- [phone] Office#- [phone] After Hours Pager: [phone]

## 2019-01-08 NOTE — Telephone Encounter (Signed)
Sister called asking what else was found on this patient besides the spine cancer and if any treatment will be given for his liver cancer. Please return her call [phone]

## 2019-01-09 ENCOUNTER — Ambulatory Visit: Admit: 2019-01-09 | Payer: Medicare Other

## 2019-01-09 ENCOUNTER — Ambulatory Visit: Payer: Medicare Other

## 2019-01-09 ENCOUNTER — Encounter: Payer: Self-pay | Admitting: Gastroenterology

## 2019-01-09 ENCOUNTER — Inpatient Hospital Stay: Payer: Medicare Other

## 2019-01-09 LAB — GLUCOSE, CAPILLARY: Glucose-Capillary: 91 mg/dL (ref 70–99)

## 2019-01-09 LAB — AFP TUMOR MARKER: AFP, Serum, Tumor Marker: 79894 ng/mL — ABNORMAL HIGH (ref 0.0–8.3)

## 2019-01-09 MED ORDER — LACTULOSE ENEMA
300.0000 mL | Freq: Every day | ORAL | Status: DC
  Administered 2019-01-09 – 2019-01-10 (×2): 300 mL via RECTAL
  Filled 2019-01-09 (×3): qty 300

## 2019-01-09 MED ORDER — FUROSEMIDE 10 MG/ML IJ SOLN
40.0000 mg | Freq: Once | INTRAMUSCULAR | Status: AC
  Administered 2019-01-09: 40 mg via INTRAVENOUS
  Filled 2019-01-09: qty 4

## 2019-01-09 NOTE — Progress Notes (Signed)
Family Meeting Note  Advance Directive:no  Today a meeting took place with the sister Andrew Gibbs over the phone, called her at [phone].  Patient sister is unable to participate due TR:ZNBVAP capacity Lethargic spoke with patient sister Andrew Gibbs over the phone, discussed about his overall condition with advanced liver cancer with mets to bones, not lethargic, patient overall prognosis really poor, she said that she will talk to her siblings, decide about CODE STATUS, she would like him to be full code for now.  Encouraged her DNR status due to his multiple medical problems, with advanced liver cancer.   The following clinical team members were present during this meeting:MD  The following were discussed:Patient's diagnosis: , Patient's progosis: Unable to determine and Goals for treatment: Full Code  Additional follow-up to be provided: Palliative care consulted.  Time spent during discussion:16 min Epifanio Lesches, MD

## 2019-01-09 NOTE — Progress Notes (Signed)
PT Cancellation Note  Patient Details Name: Andrew Gibbs MRN: [id number] DOB: 03-22-1956   Cancelled Treatment:    Reason Eval/Treat Not Completed: Patient's level of consciousness. PT discussed with RN this date, per RN he is still quite confused and unable to fully participate in treatment this date. Will continue to follow and re-attempt when patient is more appropriate.   Royce Macadamia PT, DPT, CSCS    01/09/2019, 10:10 AM

## 2019-01-09 NOTE — Progress Notes (Signed)
Pt. was more alert today after lactulose was given. Pt did not need ativan. Was able to tell me his name ,his b-day and his sister's name. Lactulose given via rectal balloon catheter.

## 2019-01-09 NOTE — Progress Notes (Signed)
MD was notified and aware of pt. Respiration that is elevated. No new orders received.

## 2019-01-09 NOTE — Progress Notes (Signed)
[facility] at [place] NAME: Andrew Gibbs    MR#:  [id number]  DATE OF BIRTH:  [birth year]  SUBJECTIVE: Confused.  Patient had congestion, worsening breathing so chest x-ray is done stat and showed pulmonary edema so we stopped IV fluids and give 1 dose of Lasix.  Patient lethargic and scheduled for another session of radiation therapy today.  CHIEF COMPLAINT:   Chief Complaint  Patient presents with   Back Pain   -   REVIEW OF SYSTEMS:  Unable to obtain review of systems because he is very confused  DRUG ALLERGIES:  No Known Allergies  VITALS:  Blood pressure 121/77, pulse 82, temperature 99.6 F (37.6 C), temperature source Oral, resp. rate (!) 30, height 5\' 7"  (1.702 m), weight 58 kg, SpO2 97 %.  PHYSICAL EXAMINATION:  Physical Exam   GENERAL:  62 y.o.-year-old disheveled appearing patient lying in the bed , very confused EYES: Pupils equal, round, reactive to light  HEENT: Head atraumatic, normocephalic. Oropharynx and nasopharynx clear.  NECK:  Supple, no jugular venous distention. No thyroid enlargement, no tenderness.  LUNGS: Normal breath sounds bilaterally, no wheezing, rales,rhonchi or crepitation. No use of accessory muscles of respiration.  Decreased bibasilar breath sound CARDIOVASCULAR: S1, S2 normal. No murmurs, rubs, or gallops.  ABDOMEN: Soft, generalized discomfort without guarding or rigidity nondistended. Bowel sounds present. No organomegaly or mass.  EXTREMITIES: No pedal edema, cyanosis, or clubbing.  NEUROLOGIC: Unable to do full neuro exam because patient is very confused  pSYCHIATRIC: very confused  sKIN: No obvious rash, lesion, or ulcer.    LABORATORY PANEL:   CBC Recent Labs  Lab 01/08/19 0336  WBC 9.5  HGB 9.7*  HCT 28.5*  PLT 116*   ------------------------------------------------------------------------------------------------------------------  Chemistries  Recent Labs  Lab  01/03/19 1503  01/08/19 0336  NA 137   < > 133*  K 5.4*   < > 3.4*  CL 108   < > 107  CO2 18*   < > 19*  GLUCOSE 101*   < > 111*  BUN 20   < > 13  CREATININE 0.52*   < > 0.51*  CALCIUM 8.3*   < > 7.7*  AST 431*  --   --   ALT 97*  --   --   ALKPHOS 194*  --   --   BILITOT 2.8*  --   --    < > = values in this interval not displayed.   ------------------------------------------------------------------------------------------------------------------  Cardiac Enzymes No results for input(s): TROPONINI in the last 168 hours. ------------------------------------------------------------------------------------------------------------------  RADIOLOGY:  Dg Chest 1 View  Result Date: 01/09/2019 CLINICAL DATA:  Abnormal respirations. Cirrhosis with hepatocellular carcinoma. EXAM: CHEST  1 VIEW COMPARISON:  Chest radiograph 01/03/2019. CT chest 01/03/2019. FINDINGS: Heart size is increased. Tortuous aorta. Sclerotic bone metastases better visualized on CT. Possible RIGHT paravertebral mass at T8. low lung volumes. Mild vascular congestion. No consolidation or edema. IMPRESSION: Low lung volumes. Cardiomegaly with mild vascular congestion. No consolidation or edema. Possible RIGHT T8 paravertebral mass. Electronically Signed   By: Staci Righter M.D.   On: 01/09/2019 08:29   [name] Contrast  Result Date: 01/08/2019 CLINICAL DATA:  Altered mental status EXAM: MRI HEAD WITHOUT CONTRAST TECHNIQUE: Multiplanar, multiecho pulse sequences of the brain and surrounding structures were obtained without intravenous contrast. COMPARISON:  Head CT 10/14/2011 FINDINGS: Examination is markedly motion degraded. BRAIN: There is no acute infarct, acute hemorrhage or extra-axial collection. The  midline structures are normal. Multifocal white matter hyperintensity, most commonly due to chronic ischemic microangiopathy. Mild generalized volume loss. No midline shift or other mass effect. VASCULAR: The major  intracranial arterial and venous sinus flow voids are normal. SKULL AND UPPER CERVICAL SPINE: Calvarial bone marrow signal is normal. There is no skull base mass. Visualized upper cervical spine and soft tissues are normal. SINUSES/ORBITS: No fluid levels or advanced mucosal thickening. No mastoid or middle ear effusion. The orbits are normal. IMPRESSION: Mild chronic small vessel disease and brain volume loss without acute intracranial abnormality. Electronically Signed   By: Ulyses Jarred M.D.   On: 01/08/2019 19:58    EKG:   Orders placed or performed during the hospital encounter of 01/03/19   ED EKG   ED EKG   EKG 12-Lead   EKG 12-Lead    ASSESSMENT AND PLAN:   63 year old male with past medical history significant for hepatitis C, hepatocellular carcinoma, alcohol abuse, liver cirrhosis, history of IV drug abuse who had a recent upper endoscopy done for esophageal varices comes to the hospital secondary to lower back pain and noted to have an episode of hematemesis  1.  Upper GI bleed-had hematemesis on admission.  Received 1 unit prbc transfusion this adm, hemoglobin stable around 9.7 today.-Received total of 2 units of blood transfusion this admission.- recent EGD 2 weeks ago with grade 1 esophageal varices,-No active bleeding noted.  DC'd octreotide drip.  Continue IV Protonix as patient is lethargic and cannot take anything by mouth. -Scolonoscopy canceled as patient could not drink, colonoscopy preparation   2.  Hepatocellular carcinoma now with bony mets, patient needs palliative radiation therapy, seen by oncology, palliative radiation therapy planned for today. -MRI of the whole spine showing significant metastatic disease, much worse in the lumbar region. -Appreciate oncology consult.  Radiation oncology consult appreciated -Patient had simulation and marking done today.  Plan to start radiation therapy to the lower back later next week.  Social worker consulted for  transportation issues after discharge. -Pain medications for now.  on IV Decadron. Spoke with patient's sister over the phone, explained that  his hepatocellular carcinoma now spread to bones and patient is getting palliative radiation therapy.  3.  Hepatocellular carcinoma-bone mets, seen by Dr. Grayland Ormond, patient is getting palliative radiation therapy.  4.  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   Prognosis really poor secondary to multiple medical problems with advanced liver cancer 5. shortness of breath, secondary to fluid overload, stopped IV fluids, gave 1 dose Lasix.  Patient had echocardiogram in October 2019 showed EF 50 to 55%. #6. hypokalemia, replace potassium.    All the records are reviewed and case discussed with Care Management/Social Workerr. Management plans discussed with the patient, family and they are in agreement.  CODE STATUS: Full code  TOTAL TIME TAKING CARE OF THIS PATIENT: 40 minutes.   Patient is very sick for discharge disposition planning at this time.  Epifanio Lesches M.D on 01/09/2019 at 12:28 PM  Between 7am to 6pm - Pager - [phone]  After 6pm go to [email or web address] - password [email or web address] [facility]  Office  [phone]  CC: Primary care physician; Center, [facility]

## 2019-01-10 ENCOUNTER — Ambulatory Visit
Admit: 2019-01-10 | Discharge: 2019-01-10 | Disposition: A | Discharge status: Home / Self Care | Payer: Medicare Other | Attending: Radiation Oncology | Admitting: Radiation Oncology

## 2019-01-10 DIAGNOSIS — Z515 Encounter for palliative care: Secondary | ICD-10-CM

## 2019-01-10 DIAGNOSIS — G893 Neoplasm related pain (acute) (chronic): Secondary | ICD-10-CM

## 2019-01-10 LAB — GLUCOSE, CAPILLARY
Glucose-Capillary: 95 mg/dL (ref 70–99)
Glucose-Capillary: 92 mg/dL (ref 70–99)
Glucose-Capillary: 98 mg/dL (ref 70–99)

## 2019-01-10 LAB — AMMONIA: Ammonia: 43 umol/L — ABNORMAL HIGH (ref 9–35)

## 2019-01-10 MED ORDER — MORPHINE SULFATE (PF) 2 MG/ML IV SOLN
2.0000 mg | INTRAVENOUS | Status: DC | PRN
  Administered 2019-01-10 – 2019-01-13 (×2): 2 mg via INTRAVENOUS
  Filled 2019-01-10 (×2): qty 1

## 2019-01-10 MED ORDER — LACTULOSE 10 GM/15ML PO SOLN
20.0000 g | Freq: Two times a day (BID) | ORAL | Status: DC
  Administered 2019-01-10 – 2019-01-13 (×6): 20 g via ORAL
  Filled 2019-01-10 (×6): qty 30

## 2019-01-10 MED ORDER — BOOST / RESOURCE BREEZE PO LIQD CUSTOM
1.0000 | Freq: Three times a day (TID) | ORAL | Status: DC
  Administered 2019-01-10 – 2019-01-12 (×7): 1 via ORAL

## 2019-01-10 NOTE — Evaluation (Signed)
Physical Therapy Evaluation Patient Details Name: Andrew Gibbs MRN: [id number] DOB: 01/18/1956 Today's Date: 01/10/2019   History of Present Illness  63 year old male with past medical history significant for hepatitis C, hepatocellular carcinoma, alcohol abuse, liver cirrhosis, history of IV drug abuse who had a recent upper endoscopy done for esophageal varices comes to the hospital secondary to lower back pain and noted to have an episode of hematemesis, has received 2 transfusions, hg 9.7 6/24. 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 Has been lethargic this hospital stay.    Clinical Impression  Pt alert, oriented to self, intermittently frustrated with PT due to communication issues (pt soft spoken PT had trouble hearing pt). Pt reported living alone in a two story home with 13 steps to enter house, pt may be an unreliable historian. Pt reported he was independent for ADLs, ambulates in his home with his cane, and that an aide comes three times a week to assist with cooking/cleaning.   Pt demonstrated rolling with tech and PT for pt care, minA for complete sidelying position. Supine <> sit with min-modA. Pt was able to sit EOB with feet supported/UE support for a few minutes, supervision/CGA, BP imporved to 106/56. Sit<> stand attempted, pt stabilized with use of legs against bed, PT stabilizing RW, CGA. Pt returned to supine quickly due to BP 73/45 and complaints of light headedness. See vitals flowsheet for orthostatic measurement details.  Overall the patient demonstrated deficits (see "PT Problem List") that impede the patient's functional abilities, safety, and mobility and would benefit from skilled PT intervention. Recommendation is STR due to current level of assistance needed, safety concerns with mobility and to maximize pt functional abilities.  Of note, RN notified of pt BP results, in supine at end of session BP  105/64.     Follow Up Recommendations SNF    Equipment Recommendations  Other (comment)(TBD)    Recommendations for Other Services       Precautions / Restrictions Precautions Precautions: Fall Restrictions Weight Bearing Restrictions: No      Mobility  Bed Mobility Overal bed mobility: Needs Assistance Bed Mobility: Rolling;Supine to Sit;Sit to Supine Rolling: Min assist   Supine to sit: Min assist Sit to supine: Mod assist   General bed mobility comments: returned to supine mod A due to BP  Transfers Overall transfer level: Needs assistance Equipment used: Rolling walker (2 wheeled) Transfers: Sit to/from Stand Sit to Stand: Min guard         General transfer comment: Pt stabilizing RW  Ambulation/Gait             General Gait Details: deferred due to safety concerns  Stairs            Wheelchair Mobility    Modified Rankin (Stroke Patients Only)       Balance Overall balance assessment: Needs assistance Sitting-balance support: Feet supported Sitting balance-Leahy Scale: Fair     Standing balance support: Bilateral upper extremity supported Standing balance-Leahy Scale: Poor Standing balance comment: reliant on RW                             Pertinent Vitals/Pain      Home Living Family/patient expects to be discharged to:: Private residence Living Arrangements: Alone Available Help at Discharge: Family;Available PRN/intermittently(pt reported having an aide that assists with cleaning/cooking 3x a week) Type of Home: House Home Access:  Stairs to enter   [employer] of Steps: 13 Home Layout: Two level Home Equipment: Cane - single point      Prior Function Level of Independence: Independent with assistive device(s)         Comments: Pt reported being independent with ADLs, ambulates in home with single point cane, pt's reliability as a historian is questionable     Hand Dominance         Extremity/Trunk Assessment   Upper Extremity Assessment Upper Extremity Assessment: Generalized weakness    Lower Extremity Assessment Lower Extremity Assessment: Generalized weakness    Cervical / Trunk Assessment Cervical / Trunk Assessment: Normal  Communication   Communication: Other (comment)(soft spoken)  Cognition Arousal/Alertness: Awake/alert Behavior During Therapy: WFL for tasks assessed/performed Overall Cognitive Status: No family/caregiver present to determine baseline cognitive functioning                                 General Comments: Pt oriented to self      General Comments      Exercises     Assessment/Plan    PT Assessment Patient needs continued PT services  PT Problem List Decreased strength;Decreased mobility;Decreased safety awareness;Decreased activity tolerance;Decreased balance;Decreased knowledge of use of DME;Decreased cognition       PT Treatment Interventions DME instruction;Therapeutic exercise;Gait training;Balance training;Stair training;Neuromuscular re-education;Functional mobility training;Therapeutic activities;Patient/family education    PT Goals (Current goals can be found in the Care Plan section)  Acute Rehab PT Goals Patient Stated Goal: to rest PT Goal Formulation: With patient Time For Goal Achievement: 01/24/19 Potential to Achieve Goals: Fair    Frequency Min 2X/week   Barriers to discharge Decreased caregiver support;Inaccessible home environment      Co-evaluation               AM-PAC PT "6 Clicks" Mobility  Outcome Measure Help needed turning from your back to your side while in a flat bed without using bedrails?: A Little Help needed moving from lying on your back to sitting on the side of a flat bed without using bedrails?: A Lot Help needed moving to and from a bed to a chair (including a wheelchair)?: A Lot Help needed standing up from a chair using your arms (e.g., wheelchair or  bedside chair)?: A Little Help needed to walk in hospital room?: A Lot Help needed climbing 3-5 steps with a railing? : Total 6 Click Score: 13    End of Session Equipment Utilized During Treatment: Gait belt Activity Tolerance: Patient limited by fatigue;Other (comment)(BP) Patient left: in bed;with bed alarm set Nurse Communication: Mobility status;Other (comment)(BP) PT Visit Diagnosis: Other abnormalities of gait and mobility (R26.89);Muscle weakness (generalized) (M62.81);Difficulty in walking, not elsewhere classified (R26.2)    Time: 1610-9604 PT Time Calculation (min) (ACUTE ONLY): 28 min   Charges:   PT Evaluation $PT Eval Moderate Complexity: 1 Mod PT Treatments $Therapeutic Activity: 8-22 mins        Lieutenant Diego PT, DPT 11:48 AM,01/10/19 [phone]

## 2019-01-10 NOTE — Progress Notes (Signed)
[place]  [phone] [phone]   Name: Andrew Gibbs Date: 01/10/2019 MRN: [id number]  DOB: 02-19-1956  Patient Care Team: [facility] [place] as PCP - General (General Practice) Clent Jacks, RN as Registered Nurse    REASON FOR CONSULTATION: Palliative Care consult requested for this 63 y.o. 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  Patient has known esophageal varices from EGD on 12/24/2018.  Patient was found to have metastatic disease of T8, L1, and L3 vertebra on CT scan with an epidural tumor extending into the neural foramina.  Patient was referred to radiation oncology with initiation of palliative RT.  Hospitalization has been complicated by hepatic encephalopathy and upper GI bleed.  Palliative care was consulted to help address goals.   CODE STATUS: Full code  PAST MEDICAL HISTORY: Past Medical History:  Diagnosis Date   Blood dyscrasia    thrombocytopenia   Hepatitis C    Heroin use     PAST SURGICAL HISTORY:  Past Surgical History:  Procedure Laterality Date   ESOPHAGOGASTRODUODENOSCOPY N/A 01/06/2019   Procedure: ESOPHAGOGASTRODUODENOSCOPY (EGD);  Surgeon: Lucilla Lame, MD;  Location: [facility] ENDOSCOPY;  Service: Endoscopy;  Laterality: N/A;   ESOPHAGOGASTRODUODENOSCOPY (EGD) WITH PROPOFOL N/A 12/24/2018   Procedure: ESOPHAGOGASTRODUODENOSCOPY (EGD) WITH PROPOFOL;  Surgeon: Jonathon Bellows, MD;  Location: [facility] ENDOSCOPY;  Service: Gastroenterology;  Laterality: N/A;   IR ANGIOGRAM SELECTIVE EACH ADDITIONAL VESSEL  10/17/2018   IR ANGIOGRAM SELECTIVE EACH ADDITIONAL VESSEL  10/17/2018   IR ANGIOGRAM SELECTIVE EACH ADDITIONAL VESSEL  10/17/2018   IR ANGIOGRAM SELECTIVE EACH  ADDITIONAL VESSEL  10/17/2018   IR ANGIOGRAM SELECTIVE EACH ADDITIONAL VESSEL  10/17/2018   IR ANGIOGRAM SELECTIVE EACH ADDITIONAL VESSEL  10/17/2018   IR ANGIOGRAM SELECTIVE EACH ADDITIONAL VESSEL  10/17/2018   IR ANGIOGRAM SELECTIVE EACH ADDITIONAL VESSEL  11/05/2018   IR ANGIOGRAM SELECTIVE EACH ADDITIONAL VESSEL  11/05/2018   IR ANGIOGRAM SELECTIVE EACH ADDITIONAL VESSEL  11/05/2018   IR ANGIOGRAM VISCERAL SELECTIVE  10/17/2018   IR ANGIOGRAM VISCERAL SELECTIVE  10/17/2018   IR ANGIOGRAM VISCERAL SELECTIVE  11/05/2018   IR EMBO ARTERIAL NOT HEMORR HEMANG INC GUIDE ROADMAPPING  10/17/2018   IR EMBO TUMOR ORGAN ISCHEMIA INFARCT INC GUIDE ROADMAPPING  11/05/2018   IR RADIOLOGIST EVAL & MGMT  05/22/2018   IR RADIOLOGIST EVAL & MGMT  08/27/2018   IR US GUIDE VASC ACCESS RIGHT  10/17/2018   IR US GUIDE VASC ACCESS RIGHT  11/05/2018   KNEE SURGERY     TEE WITHOUT CARDIOVERSION N/A 04/22/2018   Procedure: TRANSESOPHAGEAL ECHOCARDIOGRAM (TEE);  Surgeon: Teodoro Spray, MD;  Location: [facility] ORS;  Service: Cardiovascular;  Laterality: N/A;    HEMATOLOGY/ONCOLOGY HISTORY:  Oncology History  Hepatocellular carcinoma ([address])  05/17/2018 Initial Diagnosis   Hepatocellular carcinoma ([place])   05/17/2018 Cancer Staging   Staging form: Liver, AJCC 8th Edition - Clinical stage from 05/17/2018: Stage IB (cT1b, cN0, cM0) - Signed by Lloyd Huger, MD on 05/17/2018     ALLERGIES:  has No Known Allergies.  MEDICATIONS:  Current Facility-Administered Medications  Medication Dose Route Frequency Provider Last Rate Last Dose   0.9 %  sodium chloride infusion   Intravenous Continuous Lucilla Lame, MD       cefTRIAXone (ROCEPHIN) 2 g  in sodium chloride 0.9 % 100 mL IVPB  2 g Intravenous Daily Lucilla Lame, MD 200 mL/hr at 01/10/19 1006 2 g at 01/10/19 1006   chlorproMAZINE (THORAZINE) 12.5 mg in sodium chloride 0.9 % 25 mL IVPB  12.5 mg Intravenous Q6H PRN Epifanio Lesches, MD 50 mL/hr at 01/10/19 0548  12.5 mg at 01/10/19 0548   feeding supplement (BOOST / RESOURCE BREEZE) liquid 1 Container  1 Container Oral TID BM Epifanio Lesches, MD       folic acid (FOLVITE) tablet 1 mg  1 mg Oral Daily Lucilla Lame, MD   1 mg at 01/07/19 0917   lactulose (CHRONULAC) 10 GM/15ML solution 20 g  20 g Oral BID Epifanio Lesches, MD       lactulose (CHRONULAC) enema 200 gm  300 mL Rectal Daily Epifanio Lesches, MD   300 mL at 01/10/19 1005   LORazepam (ATIVAN) injection 1 mg  1 mg Intravenous Q1H PRN Lucilla Lame, MD   1 mg at 01/09/19 0134   Or   LORazepam (ATIVAN) tablet 1 mg  1 mg Oral Q6H PRN Lucilla Lame, MD       morphine 2 MG/ML injection 2 mg  2 mg Intravenous Q4H PRN Harrie Foreman, MD   2 mg at 01/10/19 5701   multivitamin with minerals tablet 1 tablet  1 tablet Oral Daily Lucilla Lame, MD   1 tablet at 01/07/19 0918   ondansetron (ZOFRAN) tablet 4 mg  4 mg Oral Q6H PRN Lucilla Lame, MD       Or   ondansetron (ZOFRAN) injection 4 mg  4 mg Intravenous Q6H PRN Lucilla Lame, MD       orphenadrine (NORFLEX) injection 60 mg  60 mg Intramuscular Q12H Lucilla Lame, MD   60 mg at 01/10/19 0544   oxyCODONE (Oxy IR/ROXICODONE) immediate release tablet 5-10 mg  5-10 mg Oral Q6H PRN Gladstone Lighter, MD       pantoprazole (PROTONIX) injection 40 mg  40 mg Intravenous Daily Epifanio Lesches, MD   40 mg at 01/10/19 0944   sodium chloride flush (NS) 0.9 % injection 3 mL  3 mL Intravenous Q12H Lucilla Lame, MD   3 mL at 01/10/19 0944   thiamine (VITAMIN B-1) tablet 100 mg  100 mg Oral Daily Lucilla Lame, MD   100 mg at 01/07/19 7793   Or   thiamine (B-1) injection 100 mg  100 mg Intravenous Daily Lucilla Lame, MD   100 mg at 01/10/19 0944    VITAL SIGNS: BP 112/64 (BP Location: Right Arm)    Pulse 71    Temp 98.1 F (36.7 C) (Oral)    Resp 18    Ht 5\' 7"  (1.702 m)    Wt 127 lb 13.9 oz (58 kg)    SpO2 100%    BMI 20.03 kg/m  Filed Weights   01/03/19 1519  Weight: 127 lb  13.9 oz (58 kg)    Estimated body mass index is 20.03 kg/m as calculated from the following:   Height as of this encounter: 5\' 7"  (1.702 m).   Weight as of this encounter: 127 lb 13.9 oz (58 kg).  LABS: CBC:    Component Value Date/Time   WBC 9.5 01/08/2019 0336   HGB 9.7 (L) 01/08/2019 0336   HGB 14.5 01/21/2014 1700   HCT 28.5 (L) 01/08/2019 0336   HCT 42.7 01/21/2014 1700   PLT 116 (L) 01/08/2019 0336   PLT 148 (L) 01/21/2014 1700   MCV  91.6 01/08/2019 0336   MCV 102 (H) 01/21/2014 1700   NEUTROABS 8.4 (H) 01/03/2019 1503   NEUTROABS 2.9 03/27/2013 0539   LYMPHSABS 2.6 01/03/2019 1503   LYMPHSABS 2.0 03/27/2013 0539   MONOABS 0.6 01/03/2019 1503   MONOABS 0.4 03/27/2013 0539   EOSABS 0.1 01/03/2019 1503   EOSABS 0.1 03/27/2013 0539   BASOSABS 0.0 01/03/2019 1503   BASOSABS 0.0 03/27/2013 0539   Comprehensive Metabolic Panel:    Component Value Date/Time   NA 133 (L) 01/08/2019 0336   NA 142 07/29/2018 1441   NA 141 01/21/2014 1700   K 3.4 (L) 01/08/2019 0336   K 4.0 01/21/2014 1700   CL 107 01/08/2019 0336   CL 105 01/21/2014 1700   CO2 19 (L) 01/08/2019 0336   CO2 25 01/21/2014 1700   BUN 13 01/08/2019 0336   BUN 7 (L) 07/29/2018 1441   BUN 9 01/21/2014 1700   CREATININE 0.51 (L) 01/08/2019 0336   CREATININE 0.90 01/21/2014 1700   GLUCOSE 111 (H) 01/08/2019 0336   GLUCOSE 95 01/21/2014 1700   CALCIUM 7.7 (L) 01/08/2019 0336   CALCIUM 8.1 (L) 01/21/2014 1700   AST 431 (H) 01/03/2019 1503   AST 80 (H) 01/21/2014 1700   ALT 97 (H) 01/03/2019 1503   ALT 28 01/21/2014 1700   ALKPHOS 194 (H) 01/03/2019 1503   ALKPHOS 103 01/21/2014 1700   BILITOT 2.8 (H) 01/03/2019 1503   BILITOT 1.4 (H) 07/29/2018 1441   BILITOT 0.6 01/21/2014 1700   PROT 7.0 01/03/2019 1503   PROT 8.2 07/29/2018 1441   PROT 8.9 (H) 01/21/2014 1700   ALBUMIN 2.0 (L) 01/03/2019 1503   ALBUMIN 3.0 (L) 07/29/2018 1441   ALBUMIN 3.4 01/21/2014 1700    RADIOGRAPHIC STUDIES: Dg Chest 1  View  Result Date: 01/09/2019 CLINICAL DATA:  Abnormal respirations. Cirrhosis with hepatocellular carcinoma. EXAM: CHEST  1 VIEW COMPARISON:  Chest radiograph 01/03/2019. CT chest 01/03/2019. FINDINGS: Heart size is increased. Tortuous aorta. Sclerotic bone metastases better visualized on CT. Possible RIGHT paravertebral mass at T8. low lung volumes. Mild vascular congestion. No consolidation or edema. IMPRESSION: Low lung volumes. Cardiomegaly with mild vascular congestion. No consolidation or edema. Possible RIGHT T8 paravertebral mass. Electronically Signed   By: Staci Righter M.D.   On: 01/09/2019 08:29   Dg Chest 2 View  Result Date: 01/03/2019 CLINICAL DATA:  Low back pain for 2 months. History of hepatitis C, IV drug use, liver cancer. EXAM: CHEST - 2 VIEW COMPARISON:  Chest x-rays dated 04/17/2018 and 02/13/2018. FINDINGS: The heart size and mediastinal contours are within normal limits. Both lungs are clear. The visualized skeletal structures are unremarkable. IMPRESSION: No active cardiopulmonary disease. Electronically Signed   By: Franki Cabot M.D.   On: 01/03/2019 17:41   [name] Contrast  Result Date: 01/08/2019 CLINICAL DATA:  Altered mental status EXAM: MRI HEAD WITHOUT CONTRAST TECHNIQUE: Multiplanar, multiecho pulse sequences of the brain and surrounding structures were obtained without intravenous contrast. COMPARISON:  Head CT 10/14/2011 FINDINGS: Examination is markedly motion degraded. BRAIN: There is no acute infarct, acute hemorrhage or extra-axial collection. The midline structures are normal. Multifocal white matter hyperintensity, most commonly due to chronic ischemic microangiopathy. Mild generalized volume loss. No midline shift or other mass effect. VASCULAR: The major intracranial arterial and venous sinus flow voids are normal. SKULL AND UPPER CERVICAL SPINE: Calvarial bone marrow signal is normal. There is no skull base mass. Visualized upper cervical spine and soft  tissues are  normal. SINUSES/ORBITS: No fluid levels or advanced mucosal thickening. No mastoid or middle ear effusion. The orbits are normal. IMPRESSION: Mild chronic small vessel disease and brain volume loss without acute intracranial abnormality. Electronically Signed   By: Ulyses Jarred M.D.   On: 01/08/2019 19:58   Mr Cervical Spine W Wo Contrast  Result Date: 01/05/2019 CLINICAL DATA:  Metastatic hepatocellular carcinoma with vertebral column metastases. Back pain and hematemesis. EXAM: MRI TOTAL SPINE WITHOUT AND WITH CONTRAST TECHNIQUE: Multisequence MR imaging of the spine from the cervical spine to the sacrum was performed prior to and following IV contrast administration for evaluation of spinal metastatic disease. CONTRAST:  5 mL Gadavist COMPARISON:  Thoracic spine MRI 04/20/2018 FINDINGS: MRI CERVICAL SPINE FINDINGS Alignment: Straightening of normal lordosis.  No static subluxation. Vertebrae: There is collapse of the superior endplate of T1 without marrow edema. There is abnormal T1-weighted signal and contrast enhancement within the right posterior elements of C7, with edema and enhancement also of the left facet joint, with a less masslike appearance. There is a small enhancing lesion at C3. Cord: Normal signal and caliber.  No epidural abnormality. Posterior Fossa, vertebral arteries, paraspinal tissues: Posterior fossa is normal. Vertebral artery flow voids are normal. No prevertebral effusion. Disc levels: C2-3: Mild disc bulge without spinal canal or neural foraminal stenosis. C3-4: Intermediate disc osteophyte complex with severe right and mild left foraminal stenosis. Narrowing of the ventral thecal sac without spinal canal stenosis. C4-5: Small disc osteophyte complex with mild right foraminal narrowing. No central spinal canal stenosis. C5-6: Mild uncovertebral hypertrophy without spinal canal or neural foraminal stenosis. C6-7: Small disc bulge without spinal canal or neural foraminal  stenosis. C7-T1: Severe narrowing of the disc space with subsidence of the inferior C7 endplate into the superior T1 endplate. As above, there is abnormal contrast enhancement of the posterior elements with a mass lesion in the right T1 lamina that extends into the spinous process. MRI THORACIC SPINE FINDINGS Alignment:  Physiologic. Vertebrae: There are multiple metastatic lesions within the thoracic spine: 1. T1 right posterior elements. 2. T2 vertebral body with mild superior endplate collapse. 3. T4 left pedicle with encroachment on the T3-4 and T4-5 left neural foramina. 4. T8 vertebral body lesion occupying most of the vertebral body with early extension into the right pedicle and encroachment into the right T7-8 and T8-9 neural foramina. 5. T10 spinous process. Cord:  Normal signal and caliber. Paraspinal and other soft tissues: Unremarkable Disc levels: T3-4: Mild narrowing of the left neural foramen due to tumor within the left T4 pedicle. T4-5: Mild left foraminal stenosis due to left T4 pedicle tumor, contacting the exiting left C4 nerve root. T7-8: Moderate right foraminal stenosis due to tumor encroachment from the expansile portion of the T8 tumor in the right pedicle. No spinal canal stenosis. T8-9: Mild encroachment into the right neural foramen from the T8 right pedicle tumor. MRI LUMBAR SPINE FINDINGS Segmentation:  Standard. Alignment:  Physiologic. Vertebrae: 1. Large tumor within the right L1 vertebral body extending into the posterior elements there is encroachment into the right lateral aspect of the thecal sac. 2. Bulky lesion in the right posterior aspect of the L3 vertebral body that encroaches on the right lateral aspect of the thecal sac. 3. Abnormal marrow signal in the left pedicle and posterior vertebral body of L4 without expansile lesion. 4. Nonexpansile left pedicle lesion of L5. 5. Small lesion at the dorsal aspect of S1. Conus medullaris: Extends to the L1 level and appears  normal. Paraspinal and other soft tissues: The visualized vascular, retroperitoneal and paraspinal structures are normal. Disc levels: L1-2: Narrowing of the right subarticular recess by epidural tumor encroachment, also causing moderate right foraminal stenosis. No central spinal canal stenosis. L2-3: There is narrowing of the inferior right lateral recess due to tumor extension from the bulky L3 lesion, displacing the descending right L3 nerve root. There is no central spinal canal or neural foraminal stenosis. L3-4: There is narrowing of the right lateral recess along the length of the L3 vertebral body due to tumor extension into the epidural space, displacing the descending right L3 and L4 nerve roots. There is no central spinal canal or neural foraminal stenosis. L4-5: No spinal canal or neural foraminal stenosis. L5-S1: No spinal canal or neural foraminal stenosis. IMPRESSION: 1. 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 2. Tumor encroachment on the left T3, left T4, right T7 and right T8 neural foramina. 3. Mild cervical metastatic disease without epidural encroachment. 4. No pathologic fracture. Electronically Signed   By: Ulyses Jarred M.D.   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 Back pain and hematemesis. EXAM: MRI TOTAL SPINE WITHOUT AND WITH CONTRAST TECHNIQUE: Multisequence MR imaging of the spine from the cervical spine to the sacrum was performed prior to and following IV contrast administration for evaluation of spinal metastatic disease. CONTRAST:  5 mL Gadavist COMPARISON:  Thoracic spine MRI 04/20/2018 FINDINGS: MRI CERVICAL SPINE FINDINGS Alignment: Straightening of normal lordosis.  No static  subluxation. Vertebrae: There is collapse of the superior endplate of T1 without marrow edema. There is abnormal T1-weighted signal and contrast enhancement within the right posterior elements of C7, with edema and enhancement also of the left facet joint, with a less masslike appearance. There is a small enhancing lesion at C3. Cord: Normal signal and caliber.  No epidural abnormality. Posterior Fossa, vertebral arteries, paraspinal tissues: Posterior fossa is normal. Vertebral artery flow voids are normal. No prevertebral effusion. Disc levels: C2-3: Mild disc bulge without spinal canal or neural foraminal stenosis. C3-4: Intermediate disc osteophyte complex with severe right and mild left foraminal stenosis. Narrowing of the ventral thecal sac without spinal canal stenosis. C4-5: Small disc osteophyte complex with mild right foraminal narrowing. No central spinal canal stenosis. C5-6: Mild uncovertebral hypertrophy without spinal canal or neural foraminal stenosis. C6-7: Small disc bulge without spinal canal or neural foraminal stenosis. C7-T1: Severe narrowing of the disc space with subsidence of the inferior C7 endplate into the superior T1 endplate. As above, there is abnormal contrast enhancement of the posterior elements with a mass lesion in the right T1 lamina that extends into the spinous process. MRI THORACIC SPINE FINDINGS Alignment:  Physiologic. Vertebrae: There are multiple metastatic lesions within the thoracic spine: 1. T1 right posterior elements. 2. T2 vertebral body with mild superior endplate collapse. 3. T4 left pedicle with encroachment on the T3-4 and T4-5 left neural foramina. 4. T8 vertebral body lesion occupying most of the vertebral body with early extension into the right pedicle and encroachment into the right T7-8 and T8-9 neural foramina. 5. T10 spinous process. Cord:  Normal signal and caliber. Paraspinal and other soft tissues: Unremarkable Disc levels: T3-4: Mild narrowing of the  left neural foramen due to  tumor within the left T4 pedicle. T4-5: Mild left foraminal stenosis due to left T4 pedicle tumor, contacting the exiting left C4 nerve root. T7-8: Moderate right foraminal stenosis due to tumor encroachment from the expansile portion of the T8 tumor in the right pedicle. No spinal canal stenosis. T8-9: Mild encroachment into the right neural foramen from the T8 right pedicle tumor. MRI LUMBAR SPINE FINDINGS Segmentation:  Standard. Alignment:  Physiologic. Vertebrae: 1. Large tumor within the right L1 vertebral body extending into the posterior elements there is encroachment into the right lateral aspect of the thecal sac. 2. Bulky lesion in the right posterior aspect of the L3 vertebral body that encroaches on the right lateral aspect of the thecal sac. 3. Abnormal marrow signal in the left pedicle and posterior vertebral body of L4 without expansile lesion. 4. Nonexpansile left pedicle lesion of L5. 5. Small lesion at the dorsal aspect of S1. Conus medullaris: Extends to the L1 level and appears normal. Paraspinal and other soft tissues: The visualized vascular, retroperitoneal and paraspinal structures are normal. Disc levels: L1-2: Narrowing of the right subarticular recess by epidural tumor encroachment, also causing moderate right foraminal stenosis. No central spinal canal stenosis. L2-3: There is narrowing of the inferior right lateral recess due to tumor extension from the bulky L3 lesion, displacing the descending right L3 nerve root. There is no central spinal canal or neural foraminal stenosis. L3-4: There is narrowing of the right lateral recess along the length of the L3 vertebral body due to tumor extension into the epidural space, displacing the descending right L3 and L4 nerve roots. There is no central spinal canal or neural foraminal stenosis. L4-5: No spinal canal or neural foraminal stenosis. L5-S1: No spinal canal or neural foraminal stenosis. IMPRESSION: 1.  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 2. Tumor encroachment on the left T3, left T4, right T7 and right T8 neural foramina. 3. Mild cervical metastatic disease without epidural encroachment. 4. No pathologic fracture. Electronically Signed   By: Ulyses Jarred M.D.   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 Back pain and hematemesis. EXAM: MRI TOTAL SPINE WITHOUT AND WITH CONTRAST TECHNIQUE: Multisequence MR imaging of the spine from the cervical spine to the sacrum was performed prior to and following IV contrast administration for evaluation of spinal metastatic disease. CONTRAST:  5 mL Gadavist COMPARISON:  Thoracic spine MRI 04/20/2018 FINDINGS: MRI CERVICAL SPINE FINDINGS Alignment: Straightening of normal lordosis.  No static subluxation. Vertebrae: There is collapse of the superior endplate of T1 without marrow edema. There is abnormal T1-weighted signal and contrast enhancement within the right posterior elements of C7, with edema and enhancement also of the left facet joint, with a less masslike appearance. There is a small enhancing lesion at C3. Cord: Normal signal and caliber.  No epidural abnormality. Posterior Fossa, vertebral arteries, paraspinal tissues: Posterior fossa is normal. Vertebral artery flow voids are normal. No prevertebral effusion. Disc levels: C2-3: Mild disc bulge without spinal canal or neural foraminal stenosis. C3-4: Intermediate disc osteophyte complex with severe right and mild left foraminal stenosis. Narrowing of the ventral thecal sac without spinal canal stenosis. C4-5: Small disc osteophyte complex with mild right foraminal narrowing. No central spinal canal stenosis. C5-6: Mild  uncovertebral hypertrophy without spinal canal or neural foraminal stenosis. C6-7: Small disc bulge without spinal canal or neural foraminal stenosis. C7-T1: Severe narrowing of the disc space with subsidence of the inferior C7 endplate into the superior T1 endplate. As above, there is abnormal contrast enhancement of the posterior elements with a mass lesion in the right T1 lamina that extends into the spinous process. MRI THORACIC SPINE FINDINGS Alignment:  Physiologic. Vertebrae: There are multiple metastatic lesions within the thoracic spine: 1. T1 right posterior elements. 2. T2 vertebral body with mild superior endplate collapse. 3. T4 left pedicle with encroachment on the T3-4 and T4-5 left neural foramina. 4. T8 vertebral body lesion occupying most of the vertebral body with early extension into the right pedicle and encroachment into the right T7-8 and T8-9 neural foramina. 5. T10 spinous process. Cord:  Normal signal and caliber. Paraspinal and other soft tissues: Unremarkable Disc levels: T3-4: Mild narrowing of the left neural foramen due to tumor within the left T4 pedicle. T4-5: Mild left foraminal stenosis due to left T4 pedicle tumor, contacting the exiting left C4 nerve root. T7-8: Moderate right foraminal stenosis due to tumor encroachment from the expansile portion of the T8 tumor in the right pedicle. No spinal canal stenosis. T8-9: Mild encroachment into the right neural foramen from the T8 right pedicle tumor. MRI LUMBAR SPINE FINDINGS Segmentation:  Standard. Alignment:  Physiologic. Vertebrae: 1. Large tumor within the right L1 vertebral body extending into the posterior elements there is encroachment into the right lateral aspect of the thecal sac. 2. Bulky lesion in the right posterior aspect of the L3 vertebral body that encroaches on the right lateral aspect of the thecal sac. 3. Abnormal marrow signal in the left pedicle and posterior vertebral body of L4 without expansile lesion. 4.  Nonexpansile left pedicle lesion of L5. 5. Small lesion at the dorsal aspect of S1. Conus medullaris: Extends to the L1 level and appears normal. Paraspinal and other soft tissues: The visualized vascular, retroperitoneal and paraspinal structures are normal. Disc levels: L1-2: Narrowing of the right subarticular recess by epidural tumor encroachment, also causing moderate right foraminal stenosis. No central spinal canal stenosis. L2-3: There is narrowing of the inferior right lateral recess due to tumor extension from the bulky L3 lesion, displacing the descending right L3 nerve root. There is no central spinal canal or neural foraminal stenosis. L3-4: There is narrowing of the right lateral recess along the length of the L3 vertebral body due to tumor extension into the epidural space, displacing the descending right L3 and L4 nerve roots. There is no central spinal canal or neural foraminal stenosis. L4-5: No spinal canal or neural foraminal stenosis. L5-S1: No spinal canal or neural foraminal stenosis. IMPRESSION: 1. 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 2. Tumor encroachment on the left T3, left T4, right T7 and right T8 neural foramina. 3. Mild cervical metastatic disease without epidural encroachment. 4. No pathologic fracture. Electronically Signed   By: Ulyses Jarred M.D.   On: 01/05/2019 19:56   Ct Angio Chest/abd/pel For Dissection W And/or W/wo  Result Date: 01/03/2019 CLINICAL DATA:  Low back and generalized abdominal pain for 2 months. EXAM: CT ANGIOGRAPHY CHEST, ABDOMEN AND PELVIS TECHNIQUE: Multidetector CT imaging through the chest, abdomen and pelvis was performed using the standard protocol during bolus administration of intravenous contrast.  Multiplanar reconstructed images and MIPs were obtained and reviewed to  evaluate the vascular anatomy. CONTRAST:  100 mL ISOVUE-370 IOPAMIDOL (ISOVUE-370) INJECTION 76% COMPARISON:  CT chest 10/16/2018. CT angiogram abdomen and pelvis 08/27/2018. FINDINGS: CTA CHEST FINDINGS Cardiovascular: Preferential opacification of the thoracic aorta. No evidence of thoracic aortic aneurysm or dissection. Normal heart size. No pericardial effusion. Mediastinum/Nodes: No enlarged mediastinal, hilar, or axillary lymph nodes. Thyroid gland, trachea, and esophagus demonstrate no significant findings. Lungs/Pleura: No pleural effusion. Lungs demonstrate mild dependent atelectasis. There is some emphysematous change in the apices. No nodule, mass or consolidative process. Musculoskeletal: Lytic lesions are seen and T2 and T3. Mild superior endplate compression fracture of T2 noted. Mixed lytic and sclerotic lesion is also present at T8. Paraspinous and epidural soft tissue tumor on the right are seen extending out of the T8 vertebral body. Tumor contacts the cord and extends into the right T7-8 foramen. There is a lytic lesion in the left T4 pedicle and medial margin of the left fourth rib. Review of the MIP images confirms the above findings. CTA ABDOMEN AND PELVIS FINDINGS VASCULAR Aorta: Normal caliber aorta without aneurysm, dissection, vasculitis or significant stenosis. Celiac: Patent without evidence of aneurysm, dissection, vasculitis or significant stenosis. SMA: Patent without evidence of aneurysm, dissection, vasculitis or significant stenosis. Renals: Both renal arteries are patent without evidence of aneurysm, dissection, vasculitis, fibromuscular dysplasia or significant stenosis. Small accessory renal arteries on the right and left noted. IMA: Patent without evidence of aneurysm, dissection, vasculitis or significant stenosis. Inflow: Patent without evidence of aneurysm, dissection, vasculitis or significant stenosis. Veins: No obvious venous abnormality within the limitations of this  arterial phase study. Review of the MIP images confirms the above findings. NON-VASCULAR Hepatobiliary: The liver is markedly cirrhotic. Round lesion in the liver measuring 4.0 x 4.5 cm in the right hepatic lobe consistent with the patient's history of hepatocellular carcinoma is noted. 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 Biliary tree is unremarkable. Vascular coils in the porta hepatis are identified. Pancreas: Unremarkable. No pancreatic ductal dilatation or surrounding inflammatory changes. Spleen: No focal lesion.  Splenomegaly. Adrenals/Urinary Tract: Renal cysts are noted. The kidneys otherwise appear normal. Ureters and urinary bladder are unremarkable. Adrenal glands appear normal. Stomach/Bowel: Stomach is within normal limits. Appendix appears normal. No evidence of bowel wall thickening, distention, or inflammatory changes. Lymphatic: No lymphadenopathy. Reproductive: Prostate is unremarkable. Other: No free or focal fluid collection. Musculoskeletal: There is a large lytic lesion and soft tissue mass in the posterior aspect of the right L1 vertebral body and pedicle. Tumor extends into the right L1-2 neural foramen and extends into the right epidural space. Second destructive lesion is seen on the right at L3 where epidural tumor extends into the thecal sac and right L3-4 foramen. Smaller lytic lesions in L4, L5 and pelvis are noted. Review of the MIP images confirms the above findings. IMPRESSION: Negative for aortic dissection or aneurysm. No acute abnormality chest, abdomen or pelvis. Markedly cirrhotic liver with a mass consistent with the patient's known a hepatocellular carcinoma. 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 These findings could be better evaluated with MRI with and without contrast. Electronically Signed   By: Inge Rise M.D.   On: 01/03/2019 21:28    PERFORMANCE STATUS (ECOG) : 3 - Symptomatic, >50% confined to bed  Review of Systems  Unless otherwise noted, a complete review of systems is negative.  Physical Exam General: frail appearing, thin Cardiovascular: regular rate and rhythm Pulmonary: clear ant fields Abdomen: soft, nontender, + bowel sounds GU: no suprapubic tenderness Extremities: no edema, no joint deformities Skin: no rashes Neurological: improved mentation, oriented to person and place  IMPRESSION: Patient's mental status is improved today.  He is alert and oriented x2.  Serum ammonia down to 43.  Patient seems to be comfortable without any acute or distressing symptoms.  I called and had a lengthy conversation with patient's sisters [name] and Jocelyn Lamer.  Patient's brother, Charlotte Crumb, also participated on the call.  Together we reviewed patient's current medical problems.  We discussed the factors that might ultimately complicate treatment for his [place].  Family verbalized understanding that he might decline over time.  However, brother was particularly concerned that patient receive some type of treatment to stop progression of the cancer, even with an understanding that such treatment might ultimately prove fatal.  We also discussed CODE STATUS in detail.  Apparently, patient had verbalized a desire to be a full code during a previous hospitalization.  Despite verbalizing understanding that resuscitation would most likely prove futile, family would like to honor his wish by him remaining a full code.  Ultimately, it is hoped that we will be able to bring patient in the clinic and discuss his wishes and facilitate decision-making.  PLAN: -Continue current scope of treatment -Full code -Will continue conversations regarding goals and follow in the clinic   Patient expressed understanding and was in agreement with this plan. He also understands that He can call the clinic at any time  with any questions, concerns, or complaints.     Time Total: 45 minutes  Visit consisted of counseling and education dealing with the complex and emotionally intense issues of symptom management and palliative care in the setting of serious and potentially life-threatening illness.Greater than 50%  of this time was spent counseling and coordinating care related to the above assessment and plan.  Signed by: Altha Harm, PhD, NP-C [phone] (Work Cell)

## 2019-01-10 NOTE — Progress Notes (Signed)
[facility] [place] [facility] [place] NAME: Andrew Gibbs    MR#:  [id number]  DATE OF BIRTH:  24-Oct-1955  Patient is alert, awake today.  Able to tell that he is in the hospital.  Denies any complaints.  No back pain, abdominal pain.  Eager to have something to drink.  CHIEF COMPLAINT:   Chief Complaint  Patient presents with   Back Pain   -   REVIEW OF SYSTEMS:  Alert ,slow to respond Cardiovascular ;no chest pain. Lungs; no shortness of breath. Neurologically alert, awake, oriented. Extremities: No extremity edema. GI: No abdominal pain. DRUG ALLERGIES:  No Known Allergies  VITALS:  Blood pressure 112/64, pulse 71, temperature 98.1 F (36.7 C), temperature source Oral, resp. rate 18, height 5\' 7"  (1.702 m), weight 58 kg, SpO2 100 %.  PHYSICAL EXAMINATION:  Physical Exam   GENERAL:  63 y.o.-year-old disheveled appearing patient lying in the bed , very confused EYES: Pupils equal, round, reactive to light  HEENT: Head atraumatic, normocephalic. Oropharynx and nasopharynx clear.  NECK:  Supple, no jugular venous distention. No thyroid enlargement, no tenderness.  LUNGS: Normal breath sounds bilaterally, no wheezing, rales,rhonchi or crepitation. No use of accessory muscles of respiration.  Decreased bibasilar breath sound CARDIOVASCULAR: S1, S2 normal. No murmurs, rubs, or gallops.  ABDOMEN: Soft, generalized discomfort without guarding or rigidity nondistended. Bowel sounds present. No organomegaly or mass.  EXTREMITIES: No pedal edema, cyanosis, or clubbing.  NEUROLOGIC: Unable to do full neuro exam because patient is very confused  pSYCHIATRIC: very confused  sKIN: No obvious rash, lesion, or ulcer.    LABORATORY PANEL:   CBC Recent Labs  Lab 01/08/19 0336  WBC 9.5  HGB 9.7*  HCT 28.5*  PLT 116*   ------------------------------------------------------------------------------------------------------------------  Chemistries    Recent Labs  Lab 01/03/19 1503  01/08/19 0336  NA 137   < > 133*  K 5.4*   < > 3.4*  CL 108   < > 107  CO2 18*   < > 19*  GLUCOSE 101*   < > 111*  BUN 20   < > 13  CREATININE 0.52*   < > 0.51*  CALCIUM 8.3*   < > 7.7*  AST 431*  --   --   ALT 97*  --   --   ALKPHOS 194*  --   --   BILITOT 2.8*  --   --    < > = values in this interval not displayed.   ------------------------------------------------------------------------------------------------------------------  Cardiac Enzymes No results for input(s): TROPONINI in the last 168 hours. ------------------------------------------------------------------------------------------------------------------  RADIOLOGY:  Dg Chest 1 View  Result Date: 01/09/2019 CLINICAL DATA:  Abnormal respirations. Cirrhosis with hepatocellular carcinoma. EXAM: CHEST  1 VIEW COMPARISON:  Chest radiograph 01/03/2019. CT chest 01/03/2019. FINDINGS: Heart size is increased. Tortuous aorta. Sclerotic bone metastases better visualized on CT. Possible RIGHT paravertebral mass at T8. low lung volumes. Mild vascular congestion. No consolidation or edema. IMPRESSION: Low lung volumes. Cardiomegaly with mild vascular congestion. No consolidation or edema. Possible RIGHT T8 paravertebral mass. Electronically Signed   By: Staci Righter M.D.   On: 01/09/2019 08:29   [name] Contrast  Result Date: 01/08/2019 CLINICAL DATA:  Altered mental status EXAM: MRI HEAD WITHOUT CONTRAST TECHNIQUE: Multiplanar, multiecho pulse sequences of the brain and surrounding structures were obtained without intravenous contrast. COMPARISON:  Head CT 10/14/2011 FINDINGS: Examination is markedly motion degraded. BRAIN: There is no acute infarct, acute hemorrhage  or extra-axial collection. The midline structures are normal. Multifocal white matter hyperintensity, most commonly due to chronic ischemic microangiopathy. Mild generalized volume loss. No midline shift or other mass effect.  VASCULAR: The major intracranial arterial and venous sinus flow voids are normal. SKULL AND UPPER CERVICAL SPINE: Calvarial bone marrow signal is normal. There is no skull base mass. Visualized upper cervical spine and soft tissues are normal. SINUSES/ORBITS: No fluid levels or advanced mucosal thickening. No mastoid or middle ear effusion. The orbits are normal. IMPRESSION: Mild chronic small vessel disease and brain volume loss without acute intracranial abnormality. Electronically Signed   By: Ulyses Jarred M.D.   On: 01/08/2019 19:58    EKG:   Orders placed or performed during the hospital encounter of 01/03/19   ED EKG   ED EKG   EKG 12-Lead   EKG 12-Lead    ASSESSMENT AND PLAN:   63 year old male with past medical history significant for hepatitis C, hepatocellular carcinoma, alcohol abuse, liver cirrhosis, history of IV drug abuse who had a recent upper endoscopy done for esophageal varices comes to the hospital secondary to lower back pain and noted to have an episode of hematemesis  1.  Upper GI bleed-had hematemesis on admission.  Received 1 unit prbc transfusion this adm, hemoglobin stable around 9.7 today.-Received total of 2 units of blood transfusion this admission.- recent EGD 2 weeks ago with grade 1 esophageal varices,-No active bleeding noted.  DC'd octreotide drip.  Colonoscopy canceled because patient could not drink GoLYTELY.  2.  Hepatocellular carcinoma now with bony mets, patient needs palliative radiation therapy, seen by oncology, getting palliative radiation therapy.-MRI of the whole spine showing significant metastatic disease, much worse in the lumbar region. -Appreciate oncology consult.  Radiation oncology consult appreciated - -Pain medications for now.  on IV Decadron. Spoke with patient's sister over the phone, explained that  his hepatocellular carcinoma now spread to bones and patient is getting palliative radiation therapy.  Spoke with patient's sisters  Fayette Pho, Davene Costain.  3.  Hepatocellular carcinoma-bone mets, seen by Dr. Grayland Ormond, patient is getting palliative radiation therapy.  4.  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 Deconditioning, physical therapy recommends SNF placement.  Prognosis really poor secondary to multiple medical problems with advanced liver cancer 5. shortness of breath, secondary to fluid overload, stopped IV fluids, gave 1 dose Lasix.  Patient had echocardiogram in October 2019 showed EF 50 to 55%. #6. hypokalemia, replace potassium.  Spoke with patient's sisters again today, updated patient's prognosis.  All the records are reviewed and case discussed with Care Management/Social Workerr. Management plans discussed with the patient, family and they are in agreement.  CODE STATUS: Full code  TOTAL TIME TAKING CARE OF THIS PATIENT: 40 minutes.   Patient is very sick for discharge disposition planning at this time.  Epifanio Lesches M.D on 01/10/2019 at 12:56 PM  Between 7am to 6pm - Pager - [phone]  After 6pm go to [email or web address] - password [email or web address]  Office  [phone]  CC: Primary care physician; Center, [facility]

## 2019-01-10 NOTE — Consult Note (Signed)
[place]  [phone] [phone]   Name: Andrew Gibbs Date: 01/10/2019 MRN: [id number]  DOB: October 01, 1955  Patient Care Team: [facility] [place] as PCP - General (General Practice) Clent Jacks, RN as Registered Nurse    REASON FOR CONSULTATION: Palliative Care consult requested for this 63 y.o. 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  Patient has known esophageal varices from EGD on 12/24/2018.  Patient was found to have metastatic disease of T8, L1, and L3 vertebra on CT scan with an epidural tumor extending into the neural foramina.  Patient was referred to radiation oncology with initiation of palliative RT.  Hospitalization has been complicated by hepatic encephalopathy and upper GI bleed.  Palliative care was consulted to help address goals.   SOCIAL HISTORY:     reports that he has been smoking. He has a 20.00 pack-year smoking history. He has never used smokeless tobacco. He reports previous alcohol use. He reports previous drug use. Drugs: IV, Cocaine, Heroin, and Marijuana.   Patient is divorced.  He lives at home alone.  He has 2 children, reportedly from whom he is estranged.  He has 4 siblings who are involved in his care.  ADVANCE DIRECTIVES:  Does not have  CODE STATUS: Full code  PAST MEDICAL HISTORY: Past Medical History:  Diagnosis Date   Blood dyscrasia    thrombocytopenia   Hepatitis C    Heroin use     PAST SURGICAL HISTORY:  Past Surgical History:  Procedure Laterality Date   ESOPHAGOGASTRODUODENOSCOPY N/A 01/06/2019   Procedure: ESOPHAGOGASTRODUODENOSCOPY (EGD);  Surgeon: Lucilla Lame, MD;  Location: [facility] ENDOSCOPY;  Service: Endoscopy;  Laterality: N/A;    ESOPHAGOGASTRODUODENOSCOPY (EGD) WITH PROPOFOL N/A 12/24/2018   Procedure: ESOPHAGOGASTRODUODENOSCOPY (EGD) WITH PROPOFOL;  Surgeon: Jonathon Bellows, MD;  Location: [facility] ENDOSCOPY;  Service: Gastroenterology;  Laterality: N/A;   IR ANGIOGRAM SELECTIVE EACH ADDITIONAL VESSEL  10/17/2018   IR ANGIOGRAM SELECTIVE EACH ADDITIONAL VESSEL  10/17/2018   IR ANGIOGRAM SELECTIVE EACH ADDITIONAL VESSEL  10/17/2018   IR ANGIOGRAM SELECTIVE EACH ADDITIONAL VESSEL  10/17/2018   IR ANGIOGRAM SELECTIVE EACH ADDITIONAL VESSEL  10/17/2018   IR ANGIOGRAM SELECTIVE EACH ADDITIONAL VESSEL  10/17/2018   IR ANGIOGRAM SELECTIVE EACH ADDITIONAL VESSEL  10/17/2018   IR ANGIOGRAM SELECTIVE EACH ADDITIONAL VESSEL  11/05/2018   IR ANGIOGRAM SELECTIVE EACH ADDITIONAL VESSEL  11/05/2018   IR ANGIOGRAM SELECTIVE EACH ADDITIONAL VESSEL  11/05/2018   IR ANGIOGRAM VISCERAL SELECTIVE  10/17/2018   IR ANGIOGRAM VISCERAL SELECTIVE  10/17/2018   IR ANGIOGRAM VISCERAL SELECTIVE  11/05/2018   IR EMBO ARTERIAL NOT HEMORR HEMANG INC GUIDE ROADMAPPING  10/17/2018   IR EMBO TUMOR ORGAN ISCHEMIA INFARCT INC GUIDE ROADMAPPING  11/05/2018   IR RADIOLOGIST EVAL & MGMT  05/22/2018   IR RADIOLOGIST EVAL & MGMT  08/27/2018   IR US GUIDE VASC ACCESS RIGHT  10/17/2018   IR US GUIDE VASC ACCESS RIGHT  11/05/2018   KNEE SURGERY     TEE WITHOUT CARDIOVERSION N/A 04/22/2018   Procedure: TRANSESOPHAGEAL ECHOCARDIOGRAM (TEE);  Surgeon: Teodoro Spray, MD;  Location: [facility] ORS;  Service: Cardiovascular;  Laterality: N/A;    HEMATOLOGY/ONCOLOGY HISTORY:  Oncology History  Hepatocellular carcinoma ([place])  05/17/2018 Initial Diagnosis   Hepatocellular carcinoma ([place])  05/17/2018 Cancer Staging   Staging form: Liver, AJCC 8th Edition - Clinical stage from 05/17/2018: Stage IB (cT1b, cN0, cM0) - Signed by Lloyd Huger, MD on 05/17/2018     ALLERGIES:  has No Known Allergies.  MEDICATIONS:  Current Facility-Administered Medications  Medication Dose  Route Frequency Provider Last Rate Last Dose   0.9 %  sodium chloride infusion   Intravenous Continuous Lucilla Lame, MD       cefTRIAXone (ROCEPHIN) 2 g in sodium chloride 0.9 % 100 mL IVPB  2 g Intravenous Daily Lucilla Lame, MD 200 mL/hr at 01/10/19 1006 2 g at 01/10/19 1006   chlorproMAZINE (THORAZINE) 12.5 mg in sodium chloride 0.9 % 25 mL IVPB  12.5 mg Intravenous Q6H PRN Epifanio Lesches, MD 50 mL/hr at 01/10/19 0548 12.5 mg at 01/10/19 0548   feeding supplement (BOOST / RESOURCE BREEZE) liquid 1 Container  1 Container Oral TID BM Epifanio Lesches, MD       folic acid (FOLVITE) tablet 1 mg  1 mg Oral Daily Lucilla Lame, MD   1 mg at 01/07/19 0917   lactulose (CHRONULAC) 10 GM/15ML solution 20 g  20 g Oral BID Epifanio Lesches, MD       lactulose (CHRONULAC) enema 200 gm  300 mL Rectal Daily Epifanio Lesches, MD   300 mL at 01/10/19 1005   LORazepam (ATIVAN) injection 1 mg  1 mg Intravenous Q1H PRN Lucilla Lame, MD   1 mg at 01/09/19 0134   Or   LORazepam (ATIVAN) tablet 1 mg  1 mg Oral Q6H PRN Lucilla Lame, MD       morphine 2 MG/ML injection 2 mg  2 mg Intravenous Q4H PRN Harrie Foreman, MD   2 mg at 01/10/19 8416   multivitamin with minerals tablet 1 tablet  1 tablet Oral Daily Lucilla Lame, MD   1 tablet at 01/07/19 0918   ondansetron (ZOFRAN) tablet 4 mg  4 mg Oral Q6H PRN Lucilla Lame, MD       Or   ondansetron (ZOFRAN) injection 4 mg  4 mg Intravenous Q6H PRN Lucilla Lame, MD       orphenadrine (NORFLEX) injection 60 mg  60 mg Intramuscular Q12H Lucilla Lame, MD   60 mg at 01/10/19 0544   oxyCODONE (Oxy IR/ROXICODONE) immediate release tablet 5-10 mg  5-10 mg Oral Q6H PRN Gladstone Lighter, MD       pantoprazole (PROTONIX) injection 40 mg  40 mg Intravenous Daily Epifanio Lesches, MD   40 mg at 01/10/19 0944   sodium chloride flush (NS) 0.9 % injection 3 mL  3 mL Intravenous Q12H Lucilla Lame, MD   3 mL at 01/10/19 0944   thiamine (VITAMIN  B-1) tablet 100 mg  100 mg Oral Daily Lucilla Lame, MD   100 mg at 01/07/19 6063   Or   thiamine (B-1) injection 100 mg  100 mg Intravenous Daily Lucilla Lame, MD   100 mg at 01/10/19 0944    VITAL SIGNS: BP 112/64 (BP Location: Right Arm)    Pulse 71    Temp 98.1 F (36.7 C) (Oral)    Resp 18    Ht 5\' 7"  (1.702 m)    Wt 127 lb 13.9 oz (58 kg)    SpO2 100%    BMI 20.03 kg/m  Filed Weights   01/03/19 1519  Weight: 127 lb 13.9 oz (58 kg)    Estimated body mass index is 20.03 kg/m as calculated from the following:  Height as of this encounter: 5\' 7"  (1.702 m).   Weight as of this encounter: 127 lb 13.9 oz (58 kg).  LABS: CBC:    Component Value Date/Time   WBC 9.5 01/08/2019 0336   HGB 9.7 (L) 01/08/2019 0336   HGB 14.5 01/21/2014 1700   HCT 28.5 (L) 01/08/2019 0336   HCT 42.7 01/21/2014 1700   PLT 116 (L) 01/08/2019 0336   PLT 148 (L) 01/21/2014 1700   MCV 91.6 01/08/2019 0336   MCV 102 (H) 01/21/2014 1700   NEUTROABS 8.4 (H) 01/03/2019 1503   NEUTROABS 2.9 03/27/2013 0539   LYMPHSABS 2.6 01/03/2019 1503   LYMPHSABS 2.0 03/27/2013 0539   MONOABS 0.6 01/03/2019 1503   MONOABS 0.4 03/27/2013 0539   EOSABS 0.1 01/03/2019 1503   EOSABS 0.1 03/27/2013 0539   BASOSABS 0.0 01/03/2019 1503   BASOSABS 0.0 03/27/2013 0539   Comprehensive Metabolic Panel:    Component Value Date/Time   NA 133 (L) 01/08/2019 0336   NA 142 07/29/2018 1441   NA 141 01/21/2014 1700   K 3.4 (L) 01/08/2019 0336   K 4.0 01/21/2014 1700   CL 107 01/08/2019 0336   CL 105 01/21/2014 1700   CO2 19 (L) 01/08/2019 0336   CO2 25 01/21/2014 1700   BUN 13 01/08/2019 0336   BUN 7 (L) 07/29/2018 1441   BUN 9 01/21/2014 1700   CREATININE 0.51 (L) 01/08/2019 0336   CREATININE 0.90 01/21/2014 1700   GLUCOSE 111 (H) 01/08/2019 0336   GLUCOSE 95 01/21/2014 1700   CALCIUM 7.7 (L) 01/08/2019 0336   CALCIUM 8.1 (L) 01/21/2014 1700   AST 431 (H) 01/03/2019 1503   AST 80 (H) 01/21/2014 1700   ALT 97 (H)  01/03/2019 1503   ALT 28 01/21/2014 1700   ALKPHOS 194 (H) 01/03/2019 1503   ALKPHOS 103 01/21/2014 1700   BILITOT 2.8 (H) 01/03/2019 1503   BILITOT 1.4 (H) 07/29/2018 1441   BILITOT 0.6 01/21/2014 1700   PROT 7.0 01/03/2019 1503   PROT 8.2 07/29/2018 1441   PROT 8.9 (H) 01/21/2014 1700   ALBUMIN 2.0 (L) 01/03/2019 1503   ALBUMIN 3.0 (L) 07/29/2018 1441   ALBUMIN 3.4 01/21/2014 1700    RADIOGRAPHIC STUDIES: Dg Chest 1 View  Result Date: 01/09/2019 CLINICAL DATA:  Abnormal respirations. Cirrhosis with hepatocellular carcinoma. EXAM: CHEST  1 VIEW COMPARISON:  Chest radiograph 01/03/2019. CT chest 01/03/2019. FINDINGS: Heart size is increased. Tortuous aorta. Sclerotic bone metastases better visualized on CT. Possible RIGHT paravertebral mass at T8. low lung volumes. Mild vascular congestion. No consolidation or edema. IMPRESSION: Low lung volumes. Cardiomegaly with mild vascular congestion. No consolidation or edema. Possible RIGHT T8 paravertebral mass. Electronically Signed   By: Staci Righter M.D.   On: 01/09/2019 08:29   Dg Chest 2 View  Result Date: 01/03/2019 CLINICAL DATA:  Low back pain for 2 months. History of hepatitis C, IV drug use, liver cancer. EXAM: CHEST - 2 VIEW COMPARISON:  Chest x-rays dated 04/17/2018 and 02/13/2018. FINDINGS: The heart size and mediastinal contours are within normal limits. Both lungs are clear. The visualized skeletal structures are unremarkable. IMPRESSION: No active cardiopulmonary disease. Electronically Signed   By: Franki Cabot M.D.   On: 01/03/2019 17:41   [name] Contrast  Result Date: 01/08/2019 CLINICAL DATA:  Altered mental status EXAM: MRI HEAD WITHOUT CONTRAST TECHNIQUE: Multiplanar, multiecho pulse sequences of the brain and surrounding structures were obtained without intravenous contrast. COMPARISON:  Head CT 10/14/2011 FINDINGS: Examination  is markedly motion degraded. BRAIN: There is no acute infarct, acute hemorrhage or  extra-axial collection. The midline structures are normal. Multifocal white matter hyperintensity, most commonly due to chronic ischemic microangiopathy. Mild generalized volume loss. No midline shift or other mass effect. VASCULAR: The major intracranial arterial and venous sinus flow voids are normal. SKULL AND UPPER CERVICAL SPINE: Calvarial bone marrow signal is normal. There is no skull base mass. Visualized upper cervical spine and soft tissues are normal. SINUSES/ORBITS: No fluid levels or advanced mucosal thickening. No mastoid or middle ear effusion. The orbits are normal. IMPRESSION: Mild chronic small vessel disease and brain volume loss without acute intracranial abnormality. Electronically Signed   By: Ulyses Jarred M.D.   On: 01/08/2019 19:58   Mr Cervical Spine W Wo Contrast  Result Date: 01/05/2019 CLINICAL DATA:  Metastatic hepatocellular carcinoma with vertebral column metastases. Back pain and hematemesis. EXAM: MRI TOTAL SPINE WITHOUT AND WITH CONTRAST TECHNIQUE: Multisequence MR imaging of the spine from the cervical spine to the sacrum was performed prior to and following IV contrast administration for evaluation of spinal metastatic disease. CONTRAST:  5 mL Gadavist COMPARISON:  Thoracic spine MRI 04/20/2018 FINDINGS: MRI CERVICAL SPINE FINDINGS Alignment: Straightening of normal lordosis.  No static subluxation. Vertebrae: There is collapse of the superior endplate of T1 without marrow edema. There is abnormal T1-weighted signal and contrast enhancement within the right posterior elements of C7, with edema and enhancement also of the left facet joint, with a less masslike appearance. There is a small enhancing lesion at C3. Cord: Normal signal and caliber.  No epidural abnormality. Posterior Fossa, vertebral arteries, paraspinal tissues: Posterior fossa is normal. Vertebral artery flow voids are normal. No prevertebral effusion. Disc levels: C2-3: Mild disc bulge without spinal canal or  neural foraminal stenosis. C3-4: Intermediate disc osteophyte complex with severe right and mild left foraminal stenosis. Narrowing of the ventral thecal sac without spinal canal stenosis. C4-5: Small disc osteophyte complex with mild right foraminal narrowing. No central spinal canal stenosis. C5-6: Mild uncovertebral hypertrophy without spinal canal or neural foraminal stenosis. C6-7: Small disc bulge without spinal canal or neural foraminal stenosis. C7-T1: Severe narrowing of the disc space with subsidence of the inferior C7 endplate into the superior T1 endplate. As above, there is abnormal contrast enhancement of the posterior elements with a mass lesion in the right T1 lamina that extends into the spinous process. MRI THORACIC SPINE FINDINGS Alignment:  Physiologic. Vertebrae: There are multiple metastatic lesions within the thoracic spine: 1. T1 right posterior elements. 2. T2 vertebral body with mild superior endplate collapse. 3. T4 left pedicle with encroachment on the T3-4 and T4-5 left neural foramina. 4. T8 vertebral body lesion occupying most of the vertebral body with early extension into the right pedicle and encroachment into the right T7-8 and T8-9 neural foramina. 5. T10 spinous process. Cord:  Normal signal and caliber. Paraspinal and other soft tissues: Unremarkable Disc levels: T3-4: Mild narrowing of the left neural foramen due to tumor within the left T4 pedicle. T4-5: Mild left foraminal stenosis due to left T4 pedicle tumor, contacting the exiting left C4 nerve root. T7-8: Moderate right foraminal stenosis due to tumor encroachment from the expansile portion of the T8 tumor in the right pedicle. No spinal canal stenosis. T8-9: Mild encroachment into the right neural foramen from the T8 right pedicle tumor. MRI LUMBAR SPINE FINDINGS Segmentation:  Standard. Alignment:  Physiologic. Vertebrae: 1. Large tumor within the right L1 vertebral body extending into the  posterior elements there is  encroachment into the right lateral aspect of the thecal sac. 2. Bulky lesion in the right posterior aspect of the L3 vertebral body that encroaches on the right lateral aspect of the thecal sac. 3. Abnormal marrow signal in the left pedicle and posterior vertebral body of L4 without expansile lesion. 4. Nonexpansile left pedicle lesion of L5. 5. Small lesion at the dorsal aspect of S1. Conus medullaris: Extends to the L1 level and appears normal. Paraspinal and other soft tissues: The visualized vascular, retroperitoneal and paraspinal structures are normal. Disc levels: L1-2: Narrowing of the right subarticular recess by epidural tumor encroachment, also causing moderate right foraminal stenosis. No central spinal canal stenosis. L2-3: There is narrowing of the inferior right lateral recess due to tumor extension from the bulky L3 lesion, displacing the descending right L3 nerve root. There is no central spinal canal or neural foraminal stenosis. L3-4: There is narrowing of the right lateral recess along the length of the L3 vertebral body due to tumor extension into the epidural space, displacing the descending right L3 and L4 nerve roots. There is no central spinal canal or neural foraminal stenosis. L4-5: No spinal canal or neural foraminal stenosis. L5-S1: No spinal canal or neural foraminal stenosis. IMPRESSION: 1. 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 2. Tumor encroachment on the left T3, left T4, right T7 and right T8 neural foramina. 3. Mild cervical metastatic disease without epidural encroachment. 4. No pathologic fracture. Electronically Signed   By: Ulyses Jarred M.D.   On: 01/05/2019 19:56   Mr Thoracic Spine W Wo Contrast  Result Date: 01/05/2019 CLINICAL DATA:  Metastatic hepatocellular carcinoma with vertebral column  metastases. Back pain and hematemesis. EXAM: MRI TOTAL SPINE WITHOUT AND WITH CONTRAST TECHNIQUE: Multisequence MR imaging of the spine from the cervical spine to the sacrum was performed prior to and following IV contrast administration for evaluation of spinal metastatic disease. CONTRAST:  5 mL Gadavist COMPARISON:  Thoracic spine MRI 04/20/2018 FINDINGS: MRI CERVICAL SPINE FINDINGS Alignment: Straightening of normal lordosis.  No static subluxation. Vertebrae: There is collapse of the superior endplate of T1 without marrow edema. There is abnormal T1-weighted signal and contrast enhancement within the right posterior elements of C7, with edema and enhancement also of the left facet joint, with a less masslike appearance. There is a small enhancing lesion at C3. Cord: Normal signal and caliber.  No epidural abnormality. Posterior Fossa, vertebral arteries, paraspinal tissues: Posterior fossa is normal. Vertebral artery flow voids are normal. No prevertebral effusion. Disc levels: C2-3: Mild disc bulge without spinal canal or neural foraminal stenosis. C3-4: Intermediate disc osteophyte complex with severe right and mild left foraminal stenosis. Narrowing of the ventral thecal sac without spinal canal stenosis. C4-5: Small disc osteophyte complex with mild right foraminal narrowing. No central spinal canal stenosis. C5-6: Mild uncovertebral hypertrophy without spinal canal or neural foraminal stenosis. C6-7: Small disc bulge without spinal canal or neural foraminal stenosis. C7-T1: Severe narrowing of the disc space with subsidence of the inferior C7 endplate into the superior T1 endplate. As above, there is abnormal contrast enhancement of the posterior elements with a mass lesion in the right T1 lamina that extends into the spinous process. MRI THORACIC SPINE FINDINGS Alignment:  Physiologic. Vertebrae: There are multiple metastatic lesions within the thoracic spine: 1. T1 right posterior  elements. 2. T2  vertebral body with mild superior endplate collapse. 3. T4 left pedicle with encroachment on the T3-4 and T4-5 left neural foramina. 4. T8 vertebral body lesion occupying most of the vertebral body with early extension into the right pedicle and encroachment into the right T7-8 and T8-9 neural foramina. 5. T10 spinous process. Cord:  Normal signal and caliber. Paraspinal and other soft tissues: Unremarkable Disc levels: T3-4: Mild narrowing of the left neural foramen due to tumor within the left T4 pedicle. T4-5: Mild left foraminal stenosis due to left T4 pedicle tumor, contacting the exiting left C4 nerve root. T7-8: Moderate right foraminal stenosis due to tumor encroachment from the expansile portion of the T8 tumor in the right pedicle. No spinal canal stenosis. T8-9: Mild encroachment into the right neural foramen from the T8 right pedicle tumor. MRI LUMBAR SPINE FINDINGS Segmentation:  Standard. Alignment:  Physiologic. Vertebrae: 1. Large tumor within the right L1 vertebral body extending into the posterior elements there is encroachment into the right lateral aspect of the thecal sac. 2. Bulky lesion in the right posterior aspect of the L3 vertebral body that encroaches on the right lateral aspect of the thecal sac. 3. Abnormal marrow signal in the left pedicle and posterior vertebral body of L4 without expansile lesion. 4. Nonexpansile left pedicle lesion of L5. 5. Small lesion at the dorsal aspect of S1. Conus medullaris: Extends to the L1 level and appears normal. Paraspinal and other soft tissues: The visualized vascular, retroperitoneal and paraspinal structures are normal. Disc levels: L1-2: Narrowing of the right subarticular recess by epidural tumor encroachment, also causing moderate right foraminal stenosis. No central spinal canal stenosis. L2-3: There is narrowing of the inferior right lateral recess due to tumor extension from the bulky L3 lesion, displacing the descending right L3 nerve root.  There is no central spinal canal or neural foraminal stenosis. L3-4: There is narrowing of the right lateral recess along the length of the L3 vertebral body due to tumor extension into the epidural space, displacing the descending right L3 and L4 nerve roots. There is no central spinal canal or neural foraminal stenosis. L4-5: No spinal canal or neural foraminal stenosis. L5-S1: No spinal canal or neural foraminal stenosis. IMPRESSION: 1. 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 2. Tumor encroachment on the left T3, left T4, right T7 and right T8 neural foramina. 3. Mild cervical metastatic disease without epidural encroachment. 4. No pathologic fracture. Electronically Signed   By: Ulyses Jarred M.D.   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 Back pain and hematemesis. EXAM: MRI TOTAL SPINE WITHOUT AND WITH CONTRAST TECHNIQUE: Multisequence MR imaging of the spine from the cervical spine to the sacrum was performed prior to and following IV contrast administration for evaluation of spinal metastatic disease. CONTRAST:  5 mL Gadavist COMPARISON:  Thoracic spine MRI 04/20/2018 FINDINGS: MRI CERVICAL SPINE FINDINGS Alignment: Straightening of normal lordosis.  No static subluxation. Vertebrae: There is collapse of the superior endplate of T1 without marrow edema. There is abnormal T1-weighted signal and contrast enhancement within the right posterior elements of C7, with edema and enhancement also of the left facet joint, with a less masslike appearance. There is a small enhancing lesion at C3. Cord:  Normal signal and caliber.  No epidural abnormality. Posterior Fossa, vertebral arteries, paraspinal tissues: Posterior fossa  is normal. Vertebral artery flow voids are normal. No prevertebral effusion. Disc levels: C2-3: Mild disc bulge without spinal canal or neural foraminal stenosis. C3-4: Intermediate disc osteophyte complex with severe right and mild left foraminal stenosis. Narrowing of the ventral thecal sac without spinal canal stenosis. C4-5: Small disc osteophyte complex with mild right foraminal narrowing. No central spinal canal stenosis. C5-6: Mild uncovertebral hypertrophy without spinal canal or neural foraminal stenosis. C6-7: Small disc bulge without spinal canal or neural foraminal stenosis. C7-T1: Severe narrowing of the disc space with subsidence of the inferior C7 endplate into the superior T1 endplate. As above, there is abnormal contrast enhancement of the posterior elements with a mass lesion in the right T1 lamina that extends into the spinous process. MRI THORACIC SPINE FINDINGS Alignment:  Physiologic. Vertebrae: There are multiple metastatic lesions within the thoracic spine: 1. T1 right posterior elements. 2. T2 vertebral body with mild superior endplate collapse. 3. T4 left pedicle with encroachment on the T3-4 and T4-5 left neural foramina. 4. T8 vertebral body lesion occupying most of the vertebral body with early extension into the right pedicle and encroachment into the right T7-8 and T8-9 neural foramina. 5. T10 spinous process. Cord:  Normal signal and caliber. Paraspinal and other soft tissues: Unremarkable Disc levels: T3-4: Mild narrowing of the left neural foramen due to tumor within the left T4 pedicle. T4-5: Mild left foraminal stenosis due to left T4 pedicle tumor, contacting the exiting left C4 nerve root. T7-8: Moderate right foraminal stenosis due to tumor encroachment from the expansile portion of the T8 tumor in the right pedicle. No spinal canal stenosis. T8-9: Mild encroachment into the right neural foramen from the T8 right pedicle tumor. MRI LUMBAR SPINE FINDINGS Segmentation:  Standard.  Alignment:  Physiologic. Vertebrae: 1. Large tumor within the right L1 vertebral body extending into the posterior elements there is encroachment into the right lateral aspect of the thecal sac. 2. Bulky lesion in the right posterior aspect of the L3 vertebral body that encroaches on the right lateral aspect of the thecal sac. 3. Abnormal marrow signal in the left pedicle and posterior vertebral body of L4 without expansile lesion. 4. Nonexpansile left pedicle lesion of L5. 5. Small lesion at the dorsal aspect of S1. Conus medullaris: Extends to the L1 level and appears normal. Paraspinal and other soft tissues: The visualized vascular, retroperitoneal and paraspinal structures are normal. Disc levels: L1-2: Narrowing of the right subarticular recess by epidural tumor encroachment, also causing moderate right foraminal stenosis. No central spinal canal stenosis. L2-3: There is narrowing of the inferior right lateral recess due to tumor extension from the bulky L3 lesion, displacing the descending right L3 nerve root. There is no central spinal canal or neural foraminal stenosis. L3-4: There is narrowing of the right lateral recess along the length of the L3 vertebral body due to tumor extension into the epidural space, displacing the descending right L3 and L4 nerve roots. There is no central spinal canal or neural foraminal stenosis. L4-5: No spinal canal or neural foraminal stenosis. L5-S1: No spinal canal or neural foraminal stenosis. IMPRESSION: 1. 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 2. Tumor encroachment on the left T3, left T4, right T7 and right T8 neural foramina.  3. Mild cervical metastatic disease without epidural encroachment. 4. No pathologic fracture. Electronically Signed   By: Ulyses Jarred M.D.   On: 01/05/2019 19:56   Ct  Angio Chest/abd/pel For Dissection W And/or W/wo  Result Date: 01/03/2019 CLINICAL DATA:  Low back and generalized abdominal pain for 2 months. EXAM: CT ANGIOGRAPHY CHEST, ABDOMEN AND PELVIS TECHNIQUE: Multidetector CT imaging through the chest, abdomen and pelvis was performed using the standard protocol during bolus administration of intravenous contrast. Multiplanar reconstructed images and MIPs were obtained and reviewed to evaluate the vascular anatomy. CONTRAST:  100 mL ISOVUE-370 IOPAMIDOL (ISOVUE-370) INJECTION 76% COMPARISON:  CT chest 10/16/2018. CT angiogram abdomen and pelvis 08/27/2018. FINDINGS: CTA CHEST FINDINGS Cardiovascular: Preferential opacification of the thoracic aorta. No evidence of thoracic aortic aneurysm or dissection. Normal heart size. No pericardial effusion. Mediastinum/Nodes: No enlarged mediastinal, hilar, or axillary lymph nodes. Thyroid gland, trachea, and esophagus demonstrate no significant findings. Lungs/Pleura: No pleural effusion. Lungs demonstrate mild dependent atelectasis. There is some emphysematous change in the apices. No nodule, mass or consolidative process. Musculoskeletal: Lytic lesions are seen and T2 and T3. Mild superior endplate compression fracture of T2 noted. Mixed lytic and sclerotic lesion is also present at T8. Paraspinous and epidural soft tissue tumor on the right are seen extending out of the T8 vertebral body. Tumor contacts the cord and extends into the right T7-8 foramen. There is a lytic lesion in the left T4 pedicle and medial margin of the left fourth rib. Review of the MIP images confirms the above findings. CTA ABDOMEN AND PELVIS FINDINGS VASCULAR Aorta: Normal caliber aorta without aneurysm, dissection, vasculitis or significant stenosis. Celiac: Patent without evidence of aneurysm, dissection, vasculitis or significant stenosis. SMA: Patent without evidence of aneurysm, dissection, vasculitis or significant stenosis. Renals: Both renal  arteries are patent without evidence of aneurysm, dissection, vasculitis, fibromuscular dysplasia or significant stenosis. Small accessory renal arteries on the right and left noted. IMA: Patent without evidence of aneurysm, dissection, vasculitis or significant stenosis. Inflow: Patent without evidence of aneurysm, dissection, vasculitis or significant stenosis. Veins: No obvious venous abnormality within the limitations of this arterial phase study. Review of the MIP images confirms the above findings. NON-VASCULAR Hepatobiliary: The liver is markedly cirrhotic. Round lesion in the liver measuring 4.0 x 4.5 cm in the right hepatic lobe consistent with the patient's history of hepatocellular carcinoma is noted. 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 Biliary tree is unremarkable. Vascular coils in the porta hepatis are identified. Pancreas: Unremarkable. No pancreatic ductal dilatation or surrounding inflammatory changes. Spleen: No focal lesion.  Splenomegaly. Adrenals/Urinary Tract: Renal cysts are noted. The kidneys otherwise appear normal. Ureters and urinary bladder are unremarkable. Adrenal glands appear normal. Stomach/Bowel: Stomach is within normal limits. Appendix appears normal. No evidence of bowel wall thickening, distention, or inflammatory changes. Lymphatic: No lymphadenopathy. Reproductive: Prostate is unremarkable. Other: No free or focal fluid collection. Musculoskeletal: There is a large lytic lesion and soft tissue mass in the posterior aspect of the right L1 vertebral body and pedicle. Tumor extends into the right L1-2 neural foramen and extends into the right epidural space. Second destructive lesion is seen on the right at L3 where epidural tumor extends into the thecal sac and right L3-4 foramen. Smaller lytic lesions in L4, L5 and pelvis are noted. Review of the MIP images confirms the above findings. IMPRESSION: Negative for aortic dissection or aneurysm. No acute abnormality chest, abdomen  or pelvis. Markedly cirrhotic liver with a mass consistent with the patient's known a  hepatocellular carcinoma. 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 These findings could be better evaluated with MRI with and without contrast. Electronically Signed   By: Inge Rise M.D.   On: 01/03/2019 21:28    PERFORMANCE STATUS (ECOG) : 3 - Symptomatic, >50% confined to bed  Review of Systems Unless otherwise noted, a complete review of systems is negative.  Physical Exam General: frail appearing, thin Cardiovascular: regular rate and rhythm Pulmonary: clear ant fields Abdomen: soft, nontender, + bowel sounds GU: no suprapubic tenderness Extremities: no edema, no joint deformities Skin: no rashes Neurological: poorly responsive  IMPRESSION: Patient is currently encephalopathic from hyperammonemia and is receiving Kayexalate.  Patient was unable to participate meaningfully in conversations regarding goals.  No family present.  I will call family to discuss goals.  Case discussed with Dr. Grayland Ormond.  Recommendation is to stabilize him medically and follow-up in the clinic to continue conversations regarding goals.  Patient is not considered to be an ideal treatment candidate given his underlying comorbidities.  PLAN: -Continue current scope of treatment -Will call patient's sister   Patient expressed understanding and was in agreement with this plan. He also understands that He can call the clinic at any time with any questions, concerns, or complaints.     Time Total: 50 minutes  Visit consisted of counseling and education dealing with the complex and emotionally intense issues of symptom management and palliative care in the setting of serious and potentially life-threatening illness.Greater than 50%  of this time was spent  counseling and coordinating care related to the above assessment and plan.  Signed by: Altha Harm, PhD, NP-C [phone] (Work Cell)

## 2019-01-10 NOTE — Care Management Important Message (Signed)
Important Message  Patient Details  Name: Andrew Gibbs MRN: [id number] Date of Birth: Jan 07, 1956   Medicare Important Message Given:  Yes  Reviewed verbally with Fayette Pho, sister, at [phone] in addition to patient.  Sister requested copy be sent to both her email and home address.  Copy sent to [address], [place], [place], [place] [address] and securely to [email or web address].   [name] 01/10/2019, 11:02 AM

## 2019-01-11 LAB — GLUCOSE, CAPILLARY
Glucose-Capillary: 141 mg/dL — ABNORMAL HIGH (ref 70–99)
Glucose-Capillary: 150 mg/dL — ABNORMAL HIGH (ref 70–99)
Glucose-Capillary: 191 mg/dL — ABNORMAL HIGH (ref 70–99)
Glucose-Capillary: 87 mg/dL (ref 70–99)

## 2019-01-11 NOTE — Progress Notes (Signed)
[facility] at [place] NAME: Andrew Gibbs    MR#:  [id number]  DATE OF BIRTH:  Aug 03, 1955  SUBJECTIVE:  CHIEF COMPLAINT:   Chief Complaint  Patient presents with  . Back Pain   No new complaint this morning.  Patient resting comfortably.  No fevers.  No bleeding.  REVIEW OF SYSTEMS:  Review of Systems  Constitutional: Negative for chills and fever.  HENT: Negative for hearing loss and tinnitus.   Eyes: Negative for blurred vision and double vision.  Respiratory: Negative for cough and hemoptysis.   Cardiovascular: Negative for chest pain and palpitations.  Gastrointestinal: Negative for blood in stool, heartburn, nausea and vomiting.  Genitourinary: Negative for dysuria and urgency.  Musculoskeletal: Negative for myalgias and neck pain.  Skin: Negative for itching and rash.  Neurological: Negative for dizziness and headaches.  Psychiatric/Behavioral: Negative for depression and hallucinations.    DRUG ALLERGIES:  No Known Allergies VITALS:  Blood pressure (!) 109/54, pulse 74, temperature 99.4 F (37.4 C), temperature source Oral, resp. rate 19, height 5\' 7"  (1.702 m), weight 58 kg, SpO2 100 %. PHYSICAL EXAMINATION:  Physical Exam   GENERAL:  63 y.o.-year-old disheveled appearing patient lying in the bed ,  EYES: Pupils equal, round, reactive to light  HEENT: Head atraumatic, normocephalic. Oropharynx and nasopharynx clear.  NECK:  Supple, no jugular venous distention. No thyroid enlargement, no tenderness.  LUNGS: Normal breath sounds bilaterally, no wheezing, rales,rhonchi or crepitation. No use of accessory muscles of respiration.  Decreased bibasilar breath sound CARDIOVASCULAR: S1, S2 normal. No murmurs, rubs, or gallops.  ABDOMEN: Soft, generalized discomfort without guarding or rigidity nondistended. Bowel sounds present. No organomegaly or mass.  EXTREMITIES: No pedal edema, cyanosis, or clubbing.  NEUROLOGIC: Moving all  extremities with no deficits.  PSYCHIATRIC: Patient awake and alert and oriented this morning. sKIN: No obvious rash, lesion, or ulcer.   LABORATORY PANEL:  Male CBC Recent Labs  Lab 01/08/19 0336  WBC 9.5  HGB 9.7*  HCT 28.5*  PLT 116*   ------------------------------------------------------------------------------------------------------------------ Chemistries  Recent Labs  Lab 01/08/19 0336  NA 133*  K 3.4*  CL 107  CO2 19*  GLUCOSE 111*  BUN 13  CREATININE 0.51*  CALCIUM 7.7*   RADIOLOGY:  No results found. ASSESSMENT AND PLAN:   63 year old male with past medical history significant for hepatitis C, hepatocellular carcinoma, alcohol abuse, liver cirrhosis, history of IV drug abuse who had a recent upper endoscopy done for esophageal varices comes to the hospital secondary to lower back pain and noted to have an episode of hematemesis  1.  Upper GI bleed-had hematemesis on admission.   Hemoglobin of 9.7 recently.Received total of 2 units of blood transfusion this admission.- recent EGD 2 weeks ago with grade 1 esophageal varices,-No active bleeding noted.  DC'd octreotide drip.  Colonoscopy canceled because patient could not drink GoLYTELY.  2.  Hepatocellular carcinoma now with bony mets, patient needs palliative radiation therapy, seen by oncology, getting palliative radiation therapy.-MRI of the whole spine showing significant metastatic disease, much worse in the lumbar region. -Appreciate oncology consult.  Radiation oncology consult appreciated - -Pain medications for now.  on IV Decadron. Dr. Vianne Bulls spoke with patient's sister over the phone, explained that  his hepatocellular carcinoma now spread to bones and patient is getting palliative radiation therapy.  Spoke with patient's sisters Fayette Pho, Davene Costain. Seen by palliative care team.  Plan is to continue current treatment.  Patient remains  full code.  3.  Hepatocellular carcinoma-bone mets,  seen by Dr. Grayland Ormond, patient is getting palliative radiation therapy.  4.  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 Deconditioning, physical therapy recommends SNF placement.  Patient currently refusing skilled nursing facility placement.  Prognosis really poor secondary to multiple medical problems with advanced liver cancer 5. shortness of breath, secondary to fluid overload, stopped IV fluids, gave 1 dose Lasix.  Patient had echocardiogram in October 2019 showed EF 50 to 55%.  DVT prophylaxis; SCD's  All the records are reviewed and case discussed with Care Management/Social Worker. Management plans discussed with the patient, family and they are in agreement.  CODE STATUS: Full Code  TOTAL TIME TAKING CARE OF THIS PATIENT: 37 minutes.   More than 50% of the time was spent in counseling/coordination of care: YES  POSSIBLE D/C IN 2 DAYS, DEPENDING ON CLINICAL CONDITION.   Heyli Min M.D on 01/11/2019 at 2:20 PM   Between 7am to 6pm - Pager - [phone]  After 6pm go to [email or web address] - Technical bre[employer] [facility]  Office  [phone]  CC: Primary care physician; Center, [facility]  Note: This dictation was prepared with Dragon dictation along with smaller phrase technology. Any transcriptional errors that result from this process are unintentional.

## 2019-01-11 NOTE — Plan of Care (Signed)
Pt  has been awake,alert and oriented. Able to verbalized his needs. Wanting to go home not rehab.Tolerating CLD , no rectal bleeding noted. Given one dose of oxycodone for lower back pain. Two bowel movements  today. Per Dr. Randa Evens lactulose tonight since he already had two loose BM's today. No other issues noted.

## 2019-01-12 LAB — CBC
HCT: 27.7 % — ABNORMAL LOW (ref 39.0–52.0)
Hemoglobin: 9 g/dL — ABNORMAL LOW (ref 13.0–17.0)
MCH: 31.9 pg (ref 26.0–34.0)
MCHC: 32.5 g/dL (ref 30.0–36.0)
MCV: 98.2 fL (ref 80.0–100.0)
Platelets: 92 10*3/uL — ABNORMAL LOW (ref 150–400)
RBC: 2.82 MIL/uL — ABNORMAL LOW (ref 4.22–5.81)
RDW: 21.4 % — ABNORMAL HIGH (ref 11.5–15.5)
WBC: 5.6 10*3/uL (ref 4.0–10.5)
nRBC: 0 % (ref 0.0–0.2)

## 2019-01-12 LAB — BASIC METABOLIC PANEL
Anion gap: 8 (ref 5–15)
BUN: 11 mg/dL (ref 8–23)
CO2: 19 mmol/L — ABNORMAL LOW (ref 22–32)
Calcium: 7.7 mg/dL — ABNORMAL LOW (ref 8.9–10.3)
Chloride: 109 mmol/L (ref 98–111)
Creatinine, Ser: 0.61 mg/dL (ref 0.61–1.24)
GFR calc Af Amer: >60 mL/min (ref >60)
GFR calc non Af Amer: >60 mL/min (ref >60)
Glucose, Bld: 151 mg/dL — ABNORMAL HIGH (ref 70–99)
Potassium: 2.6 mmol/L — CL (ref 3.5–5.1)
Sodium: 136 mmol/L (ref 135–145)

## 2019-01-12 LAB — POTASSIUM: Potassium: 3.2 mmol/L — ABNORMAL LOW (ref 3.5–5.1)

## 2019-01-12 LAB — MAGNESIUM: Magnesium: 2 mg/dL (ref 1.7–2.4)

## 2019-01-12 MED ORDER — POTASSIUM CHLORIDE CRYS ER 20 MEQ PO TBCR
40.0000 meq | EXTENDED_RELEASE_TABLET | Freq: Once | ORAL | Status: AC
  Administered 2019-01-12: 40 meq via ORAL
  Filled 2019-01-12: qty 2

## 2019-01-12 MED ORDER — POTASSIUM CHLORIDE CRYS ER 20 MEQ PO TBCR
40.0000 meq | EXTENDED_RELEASE_TABLET | Freq: Once | ORAL | Status: AC
  Administered 2019-01-12: 15:00:00 40 meq via ORAL
  Filled 2019-01-12: qty 2

## 2019-01-12 MED ORDER — POTASSIUM CHLORIDE 10 MEQ/100ML IV SOLN
10.0000 meq | INTRAVENOUS | Status: DC
  Administered 2019-01-12: 10 meq via INTRAVENOUS
  Filled 2019-01-12 (×4): qty 100

## 2019-01-12 MED ORDER — POTASSIUM CHLORIDE CRYS ER 20 MEQ PO TBCR
40.0000 meq | EXTENDED_RELEASE_TABLET | Freq: Once | ORAL | Status: AC
  Administered 2019-01-12: 40 meq via ORAL
  Filled 2019-01-12: qty 1
  Filled 2019-01-12: qty 2

## 2019-01-12 NOTE — Progress Notes (Signed)
PT Cancellation Note  Patient Details Name: Andrew Gibbs MRN: [id number] DOB: March 08, 1956   Cancelled Treatment:    Reason Eval/Treat Not Completed: (Treatment session attempted.  Patient adamantly refusing participation despite encouragement, stating "I'm going to take a nap right now". Unable to reason with or redirect.  Will continue efforts next date as appropriate and available.)   [name] H. Owens Shark, PT, DPT, NCS 01/12/19, 1:42 PM [phone]

## 2019-01-12 NOTE — Progress Notes (Signed)
[facility] at [place] NAME: Andrew Gibbs    MR#:  [id number]  DATE OF BIRTH:  1956/03/23  SUBJECTIVE:  CHIEF COMPLAINT:   Chief Complaint  Patient presents with  . Back Pain   No new complaint this morning.  Patient resting comfortably.  No fevers.  No bleeding.  Patient asking about discharge plans.  REVIEW OF SYSTEMS:  Review of Systems  Constitutional: Negative for chills and fever.  HENT: Negative for hearing loss and tinnitus.   Eyes: Negative for blurred vision and double vision.  Respiratory: Negative for cough and hemoptysis.   Cardiovascular: Negative for chest pain and palpitations.  Gastrointestinal: Negative for blood in stool, heartburn, nausea and vomiting.  Genitourinary: Negative for dysuria and urgency.  Musculoskeletal: Negative for myalgias and neck pain.  Skin: Negative for itching and rash.  Neurological: Negative for dizziness and headaches.  Psychiatric/Behavioral: Negative for depression and hallucinations.    DRUG ALLERGIES:  No Known Allergies VITALS:  Blood pressure 114/65, pulse 73, temperature 99.3 F (37.4 C), temperature source Oral, resp. rate 20, height 5\' 7"  (1.702 m), weight 58 kg, SpO2 100 %. PHYSICAL EXAMINATION:  Physical Exam   GENERAL:  63 y.o.-year-old disheveled appearing patient lying in the bed ,  EYES: Pupils equal, round, reactive to light  HEENT: Head atraumatic, normocephalic. Oropharynx and nasopharynx clear.  NECK:  Supple, no jugular venous distention. No thyroid enlargement, no tenderness.  LUNGS: Normal breath sounds bilaterally, no wheezing, rales,rhonchi or crepitation. No use of accessory muscles of respiration.  Decreased bibasilar breath sound CARDIOVASCULAR: S1, S2 normal. No murmurs, rubs, or gallops.  ABDOMEN: Soft, generalized discomfort without guarding or rigidity nondistended. Bowel sounds present. No organomegaly or mass.  EXTREMITIES: No pedal edema, cyanosis, or  clubbing.  NEUROLOGIC: Moving all extremities with no deficits.  PSYCHIATRIC: Patient awake and alert and oriented this morning. sKIN: No obvious rash, lesion, or ulcer.   LABORATORY PANEL:  Male CBC Recent Labs  Lab 01/12/19 1016  WBC 5.6  HGB 9.0*  HCT 27.7*  PLT 92*   ------------------------------------------------------------------------------------------------------------------ Chemistries  Recent Labs  Lab 01/12/19 1016  NA 136  K 2.6*  CL 109  CO2 19*  GLUCOSE 151*  BUN 11  CREATININE 0.61  CALCIUM 7.7*  MG 2.0   RADIOLOGY:  No results found. ASSESSMENT AND PLAN:   63 year old male with past medical history significant for hepatitis C, hepatocellular carcinoma, alcohol abuse, liver cirrhosis, history of IV drug abuse who had a recent upper endoscopy done for esophageal varices comes to the hospital secondary to lower back pain and noted to have an episode of hematemesis  1.  Upper GI bleed-had hematemesis on admission.   Hemoglobin of 9 recently.Received total of 2 units of blood transfusion this admission.- recent EGD 2 weeks ago with grade 1 esophageal varices,-No active bleeding noted.  DC'd octreotide drip.  Colonoscopy canceled because patient could not drink GoLYTELY.  2.  Hepatocellular carcinoma now with bony mets, patient needs palliative radiation therapy, seen by oncology, getting palliative radiation therapy.-MRI of the whole spine showing significant metastatic disease, much worse in the lumbar region. -Appreciate oncology consult.  Radiation oncology consult appreciated - -Pain medications for now.  on IV Decadron. Dr. Vianne Bulls spoke with patient's sister over the phone, explained that  his hepatocellular carcinoma now spread to bones and patient is getting palliative radiation therapy.  Spoke with patient's sisters Fayette Pho, Davene Costain. Seen by palliative care team.  Plan  is to continue current treatment.  Patient remains full code.   3.  Hepatocellular carcinoma-bone mets, seen by Dr. Grayland Ormond, patient is getting palliative radiation therapy.  4.  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 Deconditioning, physical therapy recommends SNF placement.  Patient currently refusing skilled nursing facility placement.  Prognosis really poor secondary to multiple medical problems with advanced liver cancer 5. shortness of breath, secondary to fluid overload, stopped IV fluids, gave 1 dose Lasix.  Patient had echocardiogram in October 2019 showed EF 50 to 55%.  DVT prophylaxis; SCD's  All the records are reviewed and case discussed with Care Management/Social Worker. Management plans discussed with the patient, family and they are in agreement.  CODE STATUS: Full Code  TOTAL TIME TAKING CARE OF THIS PATIENT: 35 minutes.   More than 50% of the time was spent in counseling/coordination of care: YES  POSSIBLE D/C IN 1-2 DAYS, DEPENDING ON CLINICAL CONDITION.   Alijah Akram M.D on 01/12/2019 at 1:39 PM   Between 7am to 6pm - Pager - [phone]  After 6pm go to [email or web address] - Technical bre[employer] [facility]  Office  [phone]  CC: Primary care physician; Center, [facility]  Note: This dictation was prepared with Dragon dictation along with smaller phrase technology. Any transcriptional errors that result from this process are unintentional.

## 2019-01-13 ENCOUNTER — Ambulatory Visit
Admit: 2019-01-13 | Discharge: 2019-01-13 | Disposition: A | Discharge status: Home / Self Care | Payer: Medicare Other | Attending: Radiation Oncology | Admitting: Radiation Oncology

## 2019-01-13 ENCOUNTER — Ambulatory Visit: Payer: Medicare Other | Admitting: Gastroenterology

## 2019-01-13 LAB — PHOSPHORUS: Phosphorus: 2.2 mg/dL — ABNORMAL LOW (ref 2.5–4.6)

## 2019-01-13 LAB — BASIC METABOLIC PANEL
Anion gap: 5 (ref 5–15)
BUN: 9 mg/dL (ref 8–23)
CO2: 19 mmol/L — ABNORMAL LOW (ref 22–32)
Calcium: 7.6 mg/dL — ABNORMAL LOW (ref 8.9–10.3)
Chloride: 114 mmol/L — ABNORMAL HIGH (ref 98–111)
Creatinine, Ser: 0.5 mg/dL — ABNORMAL LOW (ref 0.61–1.24)
GFR calc Af Amer: >60 mL/min (ref >60)
GFR calc non Af Amer: >60 mL/min (ref >60)
Glucose, Bld: 100 mg/dL — ABNORMAL HIGH (ref 70–99)
Potassium: 3.9 mmol/L (ref 3.5–5.1)
Sodium: 138 mmol/L (ref 135–145)

## 2019-01-13 LAB — MAGNESIUM: Magnesium: 1.9 mg/dL (ref 1.7–2.4)

## 2019-01-13 MED ORDER — OXYCODONE HCL 5 MG PO TABS: 5.0000 mg | ORAL_TABLET | ORAL | 0 refills | Status: DC | PRN

## 2019-01-13 MED ORDER — OXYCODONE HCL 5 MG PO TABS
5.0000 mg | ORAL_TABLET | ORAL | Status: DC | PRN
  Administered 2019-01-13 (×2): 10 mg via ORAL
  Filled 2019-01-13 (×2): qty 2

## 2019-01-13 MED ORDER — ENSURE ENLIVE PO LIQD
237.0000 mL | Freq: Two times a day (BID) | ORAL | Status: DC
  Administered 2019-01-13: 237 mL via ORAL

## 2019-01-13 MED ORDER — LACTULOSE 10 GM/15ML PO SOLN: 20.0000 g | Freq: Two times a day (BID) | ORAL | 0 refills | Status: AC

## 2019-01-13 NOTE — Progress Notes (Signed)
Andrew Gibbs to be D/C'd home per MD order.  Discussed prescriptions and follow up appointments with the patient. Prescriptions given to patient, medication list explained in detail. Pt verbalized understanding.  Allergies as of 01/13/2019   No Known Allergies     Medication List    TAKE these medications   cyclobenzaprine 10 MG tablet Commonly known as: FLEXERIL Take 1 tablet (10 mg total) by mouth 3 (three) times daily as needed.   lactulose 10 GM/15ML solution Commonly known as: CHRONULAC Take 30 mLs (20 g total) by mouth 2 (two) times daily. Hold for greater than 2 loose stools daily   multivitamin with minerals Tabs tablet Take 1 tablet by mouth daily.   nadolol 20 MG tablet Commonly known as: CORGARD Take 20 mg by mouth daily.   oxyCODONE 5 MG immediate release tablet Commonly known as: Oxy IR/ROXICODONE Take 1-2 tablets (5-10 mg total) by mouth every 4 (four) hours as needed for moderate pain or severe pain.   pantoprazole 40 MG tablet Commonly known as: Protonix Take 1 tablet (40 mg total) by mouth daily.   traMADol 50 MG tablet Commonly known as: ULTRAM Take 1 tablet (50 mg total) by mouth every 6 (six) hours as needed.       Vitals:   01/13/19 0455 01/13/19 1147  BP: 92/62 (!) 107/59  Pulse: 76 78  Resp: 18 20  Temp: 98.8 F (37.1 C) 99 F (37.2 C)  SpO2: 100% 100%    Skin clean, dry and intact without evidence of skin break down, no evidence of skin tears noted. IV catheter discontinued intact. Site without signs and symptoms of complications. Dressing and pressure applied. Pt denies pain at this time. No complaints noted.  An After Visit Summary was printed and given to the patient. Patient escorted via Andrew Gibbs, and D/C home via private auto.  Reviewed instructions with patients sister Andrew Gibbs. Patient to live with brother while he recovers. Coordinated transportation to radiation with the [facility]. Awaiting transport.  Andrew Gibbs Andrew Gibbs

## 2019-01-13 NOTE — Discharge Summary (Signed)
Andrew Gibbs NAME: Kvion Shapley    MR#:  [id number]  DATE OF BIRTH:  05-13-1956  DATE OF ADMISSION:  01/03/2019   ADMITTING PHYSICIAN: Gladstone Lighter, MD  DATE OF DISCHARGE: 01/13/2019  PRIMARY CARE PHYSICIAN: [facility]   ADMISSION DIAGNOSIS:  Back pain [M54.9] Hematemesis with nausea [K92.0] Other acute back pain [M54.9] DISCHARGE DIAGNOSIS:  Active Problems:   GI bleed   Hematemesis with nausea   Neoplasm related pain   Palliative care encounter  SECONDARY DIAGNOSIS:   Past Medical History:  Diagnosis Date  . Blood dyscrasia    thrombocytopenia  . Hepatitis C   . Heroin use    HOSPITAL COURSE:  Chief complaint; back pain  History of presenting complaint; Andrew Gibbs  is a 63 y.o. 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  Patient had EGD on 12/24/2018 ,with Dr. Jonathon Bellows, demonstrating a small esophageal varices.  He presented to the emergency room for evaluation of lower back pain described as aching with a pain score 8 out of 10.   CT abdomen demonstrates no evidence of aortic dissection or aneurysm.  No acute abnormality in the chest, abdomen, or pelvis.  However there is evidence of markedly cirrhotic liver with mass consistent with patient's known history of hepatocellular carcinoma.  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  Please refer to the H&P for further details.  Hospital course; 1. Upper GI bleed-had hematemesis on admission.  Hemoglobin of 9 recently.Received total of 2 units of blood transfusion this admission.- recent EGD 2 weeks ago with grade 1 esophageal varices,-No active bleeding noted. DC'd octreotide drip. Colonoscopy canceled because patient could not drink GoLYTELY.  No further bleeding. Patient got adequate  prophylaxis for SBP with IV antibiotics during this admission.  2. Hepatocellular carcinoma now with bony mets, patient needs palliative radiation therapy, seen by oncology, getting palliative radiation therapy.-MRI of the whole spine showing significant metastatic disease, much worse in the lumbar region. -Appreciate oncology consult. Radiation oncology consult appreciated Pains controlled with pain meds.  Patient previously treated with IV Decadron as well.  I spoke with patient's sisters Fayette Pho, Davene Costain and updated her on treatment plans.  She voiced understanding and she is in agreement with the plan.  They wish to continue current level of care.  Appreciate input from palliative care.  Patient remains full code.  Patient had radiation treatment initiated during this admission.  I spoke with the radiation oncologist Dr. Baruch Gouty.  Plan is to continue with radiation treatment as outpatient tomorrow.  Sister agrees to take patient for treatment scheduled for tomorrow.  Follow-up with oncologist as outpatient.  3. Hepatocellular carcinoma-bone mets, seen by Dr. Grayland Ormond, patient is getting palliative radiation therapy.  Continue the same tomorrow as outpatient.  4. Hepatic encephalopathy with ammonia of 99,improved with lactulose and edema, ammonia level 43, patient is alert, awake, started oral lactulose. Deconditioning, physical therapy recommends SNF placement.  Patient currently refused skilled nursing facility placement.  Setting up home health with physical therapy, skilled nursing and nursing aide on discharge. Prognosis really poor secondary to multiple medical problems with advanced liver cancer  5. Shortness of breath, secondary to fluid overload, stopped IV fluids, gave 1 dose Lasix. Patient had echocardiogram in October 2019 showed EF 50 to 55%.  DISCHARGE CONDITIONS:  Stable  CONSULTS OBTAINED:  Treatment Team:  Sindy Guadeloupe, MD DRUG ALLERGIES:  No Known  Allergies DISCHARGE MEDICATIONS:   Allergies as of 01/13/2019   No Known Allergies     Medication List    TAKE these medications   cyclobenzaprine 10 MG tablet Commonly known as: FLEXERIL Take 1 tablet (10 mg total) by mouth 3 (three) times daily as needed.   lactulose 10 GM/15ML solution Commonly known as: CHRONULAC Take 30 mLs (20 g total) by mouth 2 (two) times daily. Hold for greater than 2 loose stools daily   multivitamin with minerals Tabs tablet Take 1 tablet by mouth daily.   nadolol 20 MG tablet Commonly known as: CORGARD Take 20 mg by mouth daily.   oxyCODONE 5 MG immediate release tablet Commonly known as: Oxy IR/ROXICODONE Take 1-2 tablets (5-10 mg total) by mouth every 4 (four) hours as needed for moderate pain or severe pain.   pantoprazole 40 MG tablet Commonly known as: Protonix Take 1 tablet (40 mg total) by mouth daily.   traMADol 50 MG tablet Commonly known as: ULTRAM Take 1 tablet (50 mg total) by mouth every 6 (six) hours as needed.        DISCHARGE INSTRUCTIONS:   DIET:  Cardiac diet DISCHARGE CONDITION:  Stable ACTIVITY:  Activity as tolerated OXYGEN:  Home Oxygen: No.  Oxygen Delivery: room air DISCHARGE LOCATION:  home   If you experience worsening of your admission symptoms, develop shortness of breath, life threatening emergency, suicidal or homicidal thoughts you must seek medical attention immediately by calling 911 or calling your MD immediately  if symptoms less severe.  You Must read complete instructions/literature along with all the possible adverse reactions/side effects for all the Medicines you take and that have been prescribed to you. Take any new Medicines after you have completely understood and accpet all the possible adverse reactions/side effects.   Please note  You were cared for by a hospitalist during your hospital stay. If you have any questions about your discharge medications or the care you received while  you were in the hospital after you are discharged, you can call the unit and asked to speak with the hospitalist on call if the hospitalist that took care of you is not available. Once you are discharged, your primary care physician will handle any further medical issues. Please note that NO REFILLS for any discharge medications will be authorized once you are discharged, as it is imperative that you return to your primary care physician (or establish a relationship with a primary care physician if you do not have one) for your aftercare needs so that they can reassess your need for medications and monitor your lab values.    On the day of Discharge:  VITAL SIGNS:  Blood pressure (!) 107/59, pulse 78, temperature 99 F (37.2 C), temperature source Oral, resp. rate 20, height 5\' 7"  (1.702 m), weight 58 kg, SpO2 100 %. PHYSICAL EXAMINATION:  GENERAL:  63 y.o.-year-old patient lying in the bed with no acute distress.  EYES: Pupils equal, round, reactive to light and accommodation. No scleral icterus. Extraocular muscles intact.  HEENT: Head atraumatic, normocephalic. Oropharynx and nasopharynx clear.  NECK:  Supple, no jugular venous distention. No thyroid enlargement, no tenderness.  LUNGS: Normal breath sounds bilaterally, no wheezing, rales,rhonchi or crepitation. No use of accessory muscles of respiration.  CARDIOVASCULAR: S1, S2 normal. No murmurs, rubs, or gallops.  ABDOMEN: Soft, non-tender, non-distended. Bowel sounds present. No organomegaly or mass.  EXTREMITIES:  No pedal edema, cyanosis, or clubbing.  NEUROLOGIC: Cranial nerves II through XII are intact. Muscle strength 5/5 in all extremities. Sensation intact. Gait not checked.  PSYCHIATRIC: The patient is alert and oriented x 3.  SKIN: No obvious rash, lesion, or ulcer.  DATA REVIEW:   CBC Recent Labs  Lab 01/12/19 1016  WBC 5.6  HGB 9.0*  HCT 27.7*  PLT 92*    Chemistries  Recent Labs  Lab 01/13/19 0522  NA 138  K 3.9   CL 114*  CO2 19*  GLUCOSE 100*  BUN 9  CREATININE 0.50*  CALCIUM 7.6*  MG 1.9     Microbiology Results  Results for orders placed or performed during the hospital encounter of 01/03/19  SARS Coronavirus 2 (CEPHEID - Performed in [facility] hospital lab), Hosp Order     Status: None   Collection Time: 01/04/19 10:27 AM   Specimen: 1-NP & 1-OP swab, 1-sputum (productive cough); Nasopharyngeal  Result Value Ref Range Status   SARS Coronavirus 2 NEGATIVE NEGATIVE Final    Comment: (NOTE) If result is NEGATIVE SARS-CoV-2 target nucleic acids are NOT DETECTED. The SARS-CoV-2 RNA is generally detectable in upper and lower  respiratory specimens during the acute phase of infection. The lowest  concentration of SARS-CoV-2 viral copies this assay can detect is 250  copies / mL. A negative result does not preclude SARS-CoV-2 infection  and should not be used as the sole basis for treatment or other  patient management decisions.  A negative result may occur with  improper specimen collection / handling, submission of specimen other  than nasopharyngeal swab, presence of viral mutation(s) within the  areas targeted by this assay, and inadequate number of viral copies  (<250 copies / mL). A negative result must be combined with clinical  observations, patient history, and epidemiological information. If result is POSITIVE SARS-CoV-2 target nucleic acids are DETECTED. The SARS-CoV-2 RNA is generally detectable in upper and lower  respiratory specimens dur ing the acute phase of infection.  Positive  results are indicative of active infection with SARS-CoV-2.  Clinical  correlation with patient history and other diagnostic information is  necessary to determine patient infection status.  Positive results do  not rule out bacterial infection or co-infection with other viruses. If result is PRESUMPTIVE POSTIVE SARS-CoV-2 nucleic acids MAY BE PRESENT.   A presumptive positive result was  obtained on the submitted specimen  and confirmed on repeat testing.  While 2019 novel coronavirus  (SARS-CoV-2) nucleic acids may be present in the submitted sample  additional confirmatory testing may be necessary for epidemiological  and / or clinical management purposes  to differentiate between  SARS-CoV-2 and other Sarbecovirus currently known to infect humans.  If clinically indicated additional testing with an alternate test  methodology [phone]) is advised. The SARS-CoV-2 RNA is generally  detectable in upper and lower respiratory sp ecimens during the acute  phase of infection. The expected result is Negative. Fact Sheet for Patients:  StrictlyIdeas.no Fact Sheet for Healthcare Providers: BankingDealers.co.za This test is not yet approved or cleared by the [place] FDA and has been authorized for detection and/or diagnosis of SARS-CoV-2 by FDA under an Emergency Use Authorization (EUA).  This EUA will remain in effect (meaning this test can be used) for the duration of the COVID-19 declaration under Section 564(b)(1) of the Act, 21 U.S.C. section 360bbb-3(b)(1), unless the authorization is terminated or revoked sooner. Performed at [facility], [place], [place]  [address]     RADIOLOGY:  No results found.   Management plans discussed with the patient, family and they are in agreement.  CODE STATUS: Full Code   TOTAL TIME TAKING CARE OF THIS PATIENT: 42 minutes.    Trecia Maring M.D on 01/13/2019 at 1:01 PM  Between 7am to 6pm - Pager - [phone]  After 6pm go to [email or web address] - Technical brewer [facility]  Office  [phone]  CC: Primary care physician; [facility]   Note: This dictation was prepared with Dragon dictation along with smaller phrase technology. Any transcriptional errors that result from this process are  unintentional.

## 2019-01-13 NOTE — Care Management Important Message (Signed)
Important Message  Patient Details  Name: Andrew Gibbs MRN: [id number] Date of Birth: 1955/08/10   Medicare Important Message Given:  Yes     [name] 01/13/2019, 2:03 PM

## 2019-01-13 NOTE — Progress Notes (Deleted)
error 

## 2019-01-13 NOTE — Progress Notes (Signed)
Nutrition Follow Up Note  DOCUMENTATION CODES:   Not applicable  INTERVENTION:   Ensure Enlive po BID, each supplement provides 350 kcal and 20 grams of protein  Pt at high refeed risk; recommend monitor K, Mg and P labs daily until stable   MVI, thiamine and folic acid in setting of etoh abuse  NUTRITION DIAGNOSIS:   Increased nutrient needs related to chronic illness(Liver cancer, Hep C) as evidenced by increased estimated needs.  GOAL:   Patient will meet greater than or equal to 90% of their needs  -progressing   MONITOR:   PO intake, Supplement acceptance, Labs, Weight trends, Skin, I & O's  ASSESSMENT:   63 year old male with past medical history significant for hepatitis C, hepatocellular carcinoma, alcohol abuse, liver cirrhosis, history of IV drug abuse who had a recent upper endoscopy done for esophageal varices 6/9 comes to the hospital secondary to lower back pain secondary to spine metastasis and noted to have an episode of hematemesis   Pt s/p EGD 6/22; found to have esophageal varices and hypertensive gastropathy   RD working remotely.  Pt advanced to full liquid diet on 6/27 and to soft diet today. RD will change Boost breeze over to Ensure as this will provide more calories and protein. Pt likely at high refeed risk; recommend monitor electrolytes. No weight since admit; will request weekly weights.     Medications reviewed and include: folic acid, lactulose, MVI, protonix, thiamine, ceftriaxone    Labs reviewed: K 3.9 wnl, creat 0.50(L), Mg 1.9 wnl Hgb 9.0(L), Hct 27.7(L)-6/28 Iron 41(L), TIBC 255, ferritin 338, folate 9.7, B12 1370- 6/23  Diet Order:   Diet Order            DIET SOFT Room service appropriate? Yes; Fluid consistency: Thin  Diet effective now             EDUCATION NEEDS:   No education needs have been identified at this time  Skin:  Skin Assessment: Reviewed RN Assessment  Last BM:  6/28  Height:   Ht Readings from Last  1 Encounters:  01/03/19 5\' 7"  (1.702 m)    Weight:   Wt Readings from Last 1 Encounters:  01/03/19 58 kg    Ideal Body Weight:  67 kg  BMI:  Body mass index is 20.03 kg/m.  Estimated Nutritional Needs:   Kcal:  1700-2000kcal/day  Protein:  87-100g/day  Fluid:  >1.7L/day  Koleen Distance MS, RD, LDN Pager #- [phone] Office#- [phone] After Hours Pager: [phone]

## 2019-01-13 NOTE — TOC Transition Note (Signed)
Transition of Care [facility]) - CM/SW Discharge Note   Patient Details  Name: Andrew Gibbs MRN: [id number] Date of Birth: September 23, 1955  [facility] CM/SW Contact:  Ross Ludwig, LCSW Phone Number: 01/13/2019, 3:20 PM   Clinical Narrative:     CSW was informed that patient will be able to go to his brother's house [address], [place], [place], [address], who he will stay with while he recovers.  Patient declined SNF placement, CSW requested physician to order home health PT, nursing, aide, and social worker.  Patient is in agreement to having home health services.  Patient was given choice, of home care agencies, [facility] agreed to accept patient's insurance.  Patient did not request an other equipment.   Final next level of care: Home/Self Care Barriers to Discharge: Continued Medical Work up, Barriers Resolved   Patient Goals and CMS Choice Patient states their goals for this hospitalization and ongoing recovery are:: to get the hell out of here, yall aint even letting me drink nothing CMS Medicare.gov Compare Post Acute Care list provided to:: Patient Choice offered to / list presented to : Patient  Discharge Placement    Patient discharging to his brother's house.                   Discharge Plan and Services                DME Arranged: N/A DME Agency: NA       HH Arranged: RN, PT, Nurse's Aide, Social Work [employer] Agency: [facility] Date [place]: 01/13/19 Time [place]line: 1400 Representative spoke with at [place]: [place] ([place]) Interventions     Readmission Risk Interventions Readmission Risk Prevention Plan 01/05/2019  Transportation Screening Complete  PCP or Specialist Appt within 3-5 Days Not Complete  HRI or [place] Patient refused  Social Work Consult for [place] Planning/Counseling Patient refused  Palliative Care Screening Not Applicable  Medication Review  Press photographer) Complete  Some recent data might be hidden

## 2019-01-13 NOTE — Progress Notes (Signed)
Pt with low grade fever of 100, MD notified. Per MD due to Pt condition, Tylenol contraindicated, continue to monitor temp. If > 100.4, MD will consider TX. Pt already on IV ABT.

## 2019-01-14 ENCOUNTER — Ambulatory Visit
Admission: RE | Admit: 2019-01-14 | Discharge: 2019-01-14 | Disposition: A | Discharge status: Home / Self Care | Payer: Medicare Other | Source: Ambulatory Visit | Attending: Radiation Oncology | Admitting: Radiation Oncology

## 2019-01-14 ENCOUNTER — Other Ambulatory Visit: Payer: Self-pay | Admitting: *Deleted

## 2019-01-14 ENCOUNTER — Other Ambulatory Visit: Payer: Self-pay

## 2019-01-14 ENCOUNTER — Telehealth: Payer: Self-pay | Admitting: *Deleted

## 2019-01-14 DIAGNOSIS — C7951 Secondary malignant neoplasm of bone: Secondary | ICD-10-CM | POA: Diagnosis not present

## 2019-01-14 DIAGNOSIS — Z510 Encounter for antineoplastic radiation therapy: Secondary | ICD-10-CM | POA: Diagnosis not present

## 2019-01-14 DIAGNOSIS — C220 Liver cell carcinoma: Secondary | ICD-10-CM | POA: Diagnosis not present

## 2019-01-14 MED ORDER — TRAMADOL HCL 50 MG PO TABS: 50.0000 mg | ORAL_TABLET | Freq: Four times a day (QID) | ORAL | 0 refills | Status: DC | PRN

## 2019-01-14 NOTE — Telephone Encounter (Signed)
Sister called asking for Home Health to come back to see patient.

## 2019-01-14 NOTE — Telephone Encounter (Signed)
That's fine

## 2019-01-14 NOTE — Telephone Encounter (Signed)
Referral done, thanks.

## 2019-01-15 ENCOUNTER — Ambulatory Visit
Admission: RE | Admit: 2019-01-15 | Discharge: 2019-01-15 | Disposition: A | Discharge status: Home / Self Care | Payer: Medicare Other | Source: Ambulatory Visit | Attending: Radiation Oncology | Admitting: Radiation Oncology

## 2019-01-15 ENCOUNTER — Telehealth: Payer: Self-pay

## 2019-01-15 ENCOUNTER — Other Ambulatory Visit: Payer: Self-pay

## 2019-01-15 DIAGNOSIS — Z510 Encounter for antineoplastic radiation therapy: Secondary | ICD-10-CM | POA: Diagnosis not present

## 2019-01-15 DIAGNOSIS — C220 Liver cell carcinoma: Secondary | ICD-10-CM | POA: Diagnosis not present

## 2019-01-15 DIAGNOSIS — C7951 Secondary malignant neoplasm of bone: Secondary | ICD-10-CM | POA: Insufficient documentation

## 2019-01-15 NOTE — Telephone Encounter (Signed)
Patient's sister called wanting Dr. Grayland Ormond to order a hand walker and a shower chair. I then called Brad from [employer] and he stated that he will pass by with an order so Dr. Grayland Ormond could sign and patient get his supplies.

## 2019-01-16 ENCOUNTER — Ambulatory Visit
Admission: RE | Admit: 2019-01-16 | Discharge: 2019-01-16 | Disposition: A | Discharge status: Home / Self Care | Payer: Medicare Other | Source: Ambulatory Visit | Attending: Radiation Oncology | Admitting: Radiation Oncology

## 2019-01-16 ENCOUNTER — Other Ambulatory Visit: Payer: Self-pay

## 2019-01-16 DIAGNOSIS — C7951 Secondary malignant neoplasm of bone: Secondary | ICD-10-CM | POA: Diagnosis not present

## 2019-01-20 ENCOUNTER — Other Ambulatory Visit: Payer: Self-pay

## 2019-01-20 ENCOUNTER — Ambulatory Visit
Admission: RE | Admit: 2019-01-20 | Discharge: 2019-01-20 | Disposition: A | Discharge status: Home / Self Care | Payer: Medicare Other | Source: Ambulatory Visit | Attending: Radiation Oncology | Admitting: Radiation Oncology

## 2019-01-20 DIAGNOSIS — C7951 Secondary malignant neoplasm of bone: Secondary | ICD-10-CM | POA: Diagnosis not present

## 2019-01-21 ENCOUNTER — Other Ambulatory Visit: Payer: Self-pay | Admitting: Oncology

## 2019-01-21 ENCOUNTER — Other Ambulatory Visit: Payer: Self-pay

## 2019-01-21 ENCOUNTER — Ambulatory Visit: Payer: Medicare Other | Admitting: Gastroenterology

## 2019-01-21 ENCOUNTER — Telehealth: Payer: Self-pay | Admitting: *Deleted

## 2019-01-21 ENCOUNTER — Ambulatory Visit
Admission: RE | Admit: 2019-01-21 | Discharge: 2019-01-21 | Disposition: A | Discharge status: Home / Self Care | Payer: Medicare Other | Source: Ambulatory Visit | Attending: Radiation Oncology | Admitting: Radiation Oncology

## 2019-01-21 ENCOUNTER — Inpatient Hospital Stay (HOSPITAL_BASED_OUTPATIENT_CLINIC_OR_DEPARTMENT_OTHER): Payer: Medicare Other | Admitting: Oncology

## 2019-01-21 ENCOUNTER — Encounter: Payer: Self-pay | Admitting: Oncology

## 2019-01-21 ENCOUNTER — Ambulatory Visit: Payer: Medicare Other

## 2019-01-21 ENCOUNTER — Inpatient Hospital Stay: Payer: Medicare Other | Attending: Oncology

## 2019-01-21 VITALS — BP 122/77 | HR 73 | Temp 93.7°F | Resp 18 | Wt 131.0 lb

## 2019-01-21 DIAGNOSIS — K7290 Hepatic failure, unspecified without coma: Secondary | ICD-10-CM | POA: Diagnosis not present

## 2019-01-21 DIAGNOSIS — Z720 Tobacco use: Secondary | ICD-10-CM

## 2019-01-21 DIAGNOSIS — R140 Abdominal distension (gaseous): Secondary | ICD-10-CM

## 2019-01-21 DIAGNOSIS — C220 Liver cell carcinoma: Secondary | ICD-10-CM

## 2019-01-21 DIAGNOSIS — B182 Chronic viral hepatitis C: Secondary | ICD-10-CM

## 2019-01-21 DIAGNOSIS — C7951 Secondary malignant neoplasm of bone: Secondary | ICD-10-CM | POA: Diagnosis not present

## 2019-01-21 DIAGNOSIS — R188 Other ascites: Secondary | ICD-10-CM

## 2019-01-21 DIAGNOSIS — F1910 Other psychoactive substance abuse, uncomplicated: Secondary | ICD-10-CM

## 2019-01-21 DIAGNOSIS — R17 Unspecified jaundice: Secondary | ICD-10-CM

## 2019-01-21 DIAGNOSIS — E876 Hypokalemia: Secondary | ICD-10-CM

## 2019-01-21 LAB — COMPREHENSIVE METABOLIC PANEL
ALT: 98 U/L — ABNORMAL HIGH (ref 0–44)
AST: 392 U/L — ABNORMAL HIGH (ref 15–41)
Albumin: 2.1 g/dL — ABNORMAL LOW (ref 3.5–5.0)
Alkaline Phosphatase: 293 U/L — ABNORMAL HIGH (ref 38–126)
Anion gap: 7 (ref 5–15)
BUN: 11 mg/dL (ref 8–23)
CO2: 21 mmol/L — ABNORMAL LOW (ref 22–32)
Calcium: 8.1 mg/dL — ABNORMAL LOW (ref 8.9–10.3)
Chloride: 109 mmol/L (ref 98–111)
Creatinine, Ser: 0.45 mg/dL — ABNORMAL LOW (ref 0.61–1.24)
GFR calc Af Amer: >60 mL/min (ref >60)
GFR calc non Af Amer: >60 mL/min (ref >60)
Glucose, Bld: 106 mg/dL — ABNORMAL HIGH (ref 70–99)
Potassium: 3.8 mmol/L (ref 3.5–5.1)
Sodium: 137 mmol/L (ref 135–145)
Total Bilirubin: 4.4 mg/dL — ABNORMAL HIGH (ref 0.3–1.2)
Total Protein: 7.3 g/dL (ref 6.5–8.1)

## 2019-01-21 LAB — CBC WITH DIFFERENTIAL/PLATELET
Abs Immature Granulocytes: 0.03 10*3/uL (ref 0.00–0.07)
Basophils Absolute: 0 10*3/uL (ref 0.0–0.1)
Basophils Relative: 0 %
Eosinophils Absolute: 0.1 10*3/uL (ref 0.0–0.5)
Eosinophils Relative: 2 %
HCT: 28.4 % — ABNORMAL LOW (ref 39.0–52.0)
Hemoglobin: 9.1 g/dL — ABNORMAL LOW (ref 13.0–17.0)
Immature Granulocytes: 1 %
Lymphocytes Relative: 14 %
Lymphs Abs: 0.5 10*3/uL — ABNORMAL LOW (ref 0.7–4.0)
MCH: 32.5 pg (ref 26.0–34.0)
MCHC: 32 g/dL (ref 30.0–36.0)
MCV: 101.4 fL — ABNORMAL HIGH (ref 80.0–100.0)
Monocytes Absolute: 0.2 10*3/uL (ref 0.1–1.0)
Monocytes Relative: 6 %
Neutro Abs: 3 10*3/uL (ref 1.7–7.7)
Neutrophils Relative %: 77 %
Platelets: 119 10*3/uL — ABNORMAL LOW (ref 150–400)
RBC: 2.8 MIL/uL — ABNORMAL LOW (ref 4.22–5.81)
RDW: 24.5 % — ABNORMAL HIGH (ref 11.5–15.5)
WBC: 3.9 10*3/uL — ABNORMAL LOW (ref 4.0–10.5)
nRBC: 0 % (ref 0.0–0.2)

## 2019-01-21 MED ORDER — TRAMADOL HCL 50 MG PO TABS: 50.0000 mg | ORAL_TABLET | Freq: Four times a day (QID) | ORAL | 0 refills | Status: DC | PRN

## 2019-01-21 NOTE — Progress Notes (Signed)
Spoke to patient's sister [name] regarding his current condition.  He presented to symptom management today with complaints of abdominal distention.  We collected labs which revealed an elevated bilirubin, LFTs and alk phos.  Patient appeared jaundice.  His abdomen was distended and tight.  He denied abdominal pain.  He denied constipation or trouble with his bladder.  He denied shortness of breath or chest pain.  Vital signs were stable.  I arranged for him to have a ultrasound-guided paracentesis completed today STAT.  Patient refused stating "he wanted to go home".  I also gave him the option to report to the emergency room to be admitted to the hospital given labs and abdominal distention.  He declined that as well.  He was agreeable to have the paracentesis completed tomorrow.  Paracentesis is scheduled for 230 after his radiation.  I will reach out to Dr. Vicente Males his GI physician to see if this can be managed outpatient.   Dr. Grayland Ormond aware.  Faythe Casa, NP 01/21/2019 4:08 PM

## 2019-01-21 NOTE — Telephone Encounter (Signed)
Patient sister called asking if we think patient can live on his own. He is currently living with his brother but wants to get an apartment by himself. She asks for doctor opinion on him living alone in his current state of health. Please advise

## 2019-01-21 NOTE — Progress Notes (Signed)
Symptom Management Consult note [facility]  [phone] [phone]  Patient Care Team: Center, [facility] as PCP - General (General Practice) Clent Jacks, RN as Registered Nurse   Name of the patient: Andrew Gibbs  [id number]  Jun 19, 1956   Date of visit: 01/21/2019   Diagnosis-hepatocellular carcinoma  Chief complaint/ Reason for visit-abdominal distention  Heme/Onc history: Patient initially presented in January 2020 for chronic hepatitis C and diagnosed with decompensated liver cirrhosis with ascites.  He was also found to have a 4 cm liver mass status post Y 90 ablation in April 2020.  His AFP was greater than 33,000.  Patient has long history of IV drug use but states he quit in October 2019.  He also has history of alcohol abuse.  Previously treated for hepatitis C in 1976.  His case was discussed at [facility] hepatobiliary conference and there was concern for autoimmune hepatitis.  He was found not to be a liver transplant candidate.  It was suggested he undergo TACE plus or minus systemic therapy.   Oncology History  Hepatocellular carcinoma ([place])  05/17/2018 Initial Diagnosis   Hepatocellular carcinoma ([place])   05/17/2018 Cancer Staging   Staging form: Liver, AJCC 8th Edition - Clinical stage from 05/17/2018: Stage IB (cT1b, cN0, cM0) - Signed by Lloyd Huger, MD on 05/17/2018    Interval history-patient presents to [facility] for complaints of abdominal distention x2 days.  He denies any abdominal pain, nausea or vomiting.  He reports normal bowel movements and denies constipation.  States most the time his bowel movements are loose but otherwise normal.  His appetite is fair.   He began palliative radiation to lumbar spine on 01/13/2019.  He had an extended inpatient stay at [facility] from 01/03/2019-01/13/2019.  He presented for back pain.Shortly after his arrival, he was noted to have vomited a large amount of dark red  clots followed by complaints of abdominal pain.  He was started on octreotide and Protonix infusions.  He was noted to be hyperkalemic with a potassium of 5.4 and given lactulose. Hospital course included 2 units of blood for an upper GI bleed.  Unable to complete colonoscopy due to patient inability to consume GoLYTELY.  He was found to have new bony mets and met with Dr. Donella Stade. for radiation.  He was also diagnosed with hepatic encephalopathy with ammonia of 99 that improved with lactulose.  He was encouraged to be discharged to a skilled nursing facility.  Patient refused but was agreeable to home health.  Seen by Dr. Vicente Males on 12/19/18 for melena. Had EGD on 12/24/18 that revealed esophageal varices.    He was evaluated in the emergency room on 12/02/2018 for complaints of back pain.  Was given 30 mg IM of Toradol.  X-ray of the lumbar spine and sacrum/coccyx were normal.  Patient refused MRI.    ECOG FS:1 - Symptomatic but completely ambulatory  Review of systems- Review of Systems  Constitutional: Negative.  Negative for chills, fever, malaise/fatigue and weight loss.  HENT: Negative for congestion, ear pain and tinnitus.   Eyes: Negative.  Negative for blurred vision and double vision.  Respiratory: Negative.  Negative for cough, sputum production and shortness of breath.   Cardiovascular: Negative.  Negative for chest pain, palpitations and leg swelling.  Gastrointestinal: Negative.  Negative for abdominal pain, constipation, diarrhea, nausea and vomiting.       Abdominal distention  Genitourinary: Negative for dysuria, frequency and urgency.  Musculoskeletal:  Positive for back pain. Negative for falls.  Skin: Negative.  Negative for rash.  Neurological: Negative.  Negative for weakness and headaches.  Endo/Heme/Allergies: Negative.  Does not bruise/bleed easily.  Psychiatric/Behavioral: Negative.  Negative for depression. The patient is not nervous/anxious and does not have insomnia.       Current treatment- s/p Y-90 ablation- 11/05/18.  Started palliative radiation on 01/09/2021 lumbar spine.  Currently not on active treatment.  No Known Allergies   Past Medical History:  Diagnosis Date   Blood dyscrasia    thrombocytopenia   Hepatitis C    Heroin use      Past Surgical History:  Procedure Laterality Date   ESOPHAGOGASTRODUODENOSCOPY N/A 01/06/2019   Procedure: ESOPHAGOGASTRODUODENOSCOPY (EGD);  Surgeon: Lucilla Lame, MD;  Location: [facility] ENDOSCOPY;  Service: Endoscopy;  Laterality: N/A;   ESOPHAGOGASTRODUODENOSCOPY (EGD) WITH PROPOFOL N/A 12/24/2018   Procedure: ESOPHAGOGASTRODUODENOSCOPY (EGD) WITH PROPOFOL;  Surgeon: Jonathon Bellows, MD;  Location: [facility] ENDOSCOPY;  Service: Gastroenterology;  Laterality: N/A;   IR ANGIOGRAM SELECTIVE EACH ADDITIONAL VESSEL  10/17/2018   IR ANGIOGRAM SELECTIVE EACH ADDITIONAL VESSEL  10/17/2018   IR ANGIOGRAM SELECTIVE EACH ADDITIONAL VESSEL  10/17/2018   IR ANGIOGRAM SELECTIVE EACH ADDITIONAL VESSEL  10/17/2018   IR ANGIOGRAM SELECTIVE EACH ADDITIONAL VESSEL  10/17/2018   IR ANGIOGRAM SELECTIVE EACH ADDITIONAL VESSEL  10/17/2018   IR ANGIOGRAM SELECTIVE EACH ADDITIONAL VESSEL  10/17/2018   IR ANGIOGRAM SELECTIVE EACH ADDITIONAL VESSEL  11/05/2018   IR ANGIOGRAM SELECTIVE EACH ADDITIONAL VESSEL  11/05/2018   IR ANGIOGRAM SELECTIVE EACH ADDITIONAL VESSEL  11/05/2018   IR ANGIOGRAM VISCERAL SELECTIVE  10/17/2018   IR ANGIOGRAM VISCERAL SELECTIVE  10/17/2018   IR ANGIOGRAM VISCERAL SELECTIVE  11/05/2018   IR EMBO ARTERIAL NOT HEMORR HEMANG INC GUIDE ROADMAPPING  10/17/2018   IR EMBO TUMOR ORGAN ISCHEMIA INFARCT INC GUIDE ROADMAPPING  11/05/2018   IR RADIOLOGIST EVAL & MGMT  05/22/2018   IR RADIOLOGIST EVAL & MGMT  08/27/2018   IR US GUIDE VASC ACCESS RIGHT  10/17/2018   IR US GUIDE VASC ACCESS RIGHT  11/05/2018   KNEE SURGERY     TEE WITHOUT CARDIOVERSION N/A 04/22/2018   Procedure: TRANSESOPHAGEAL ECHOCARDIOGRAM (TEE);  Surgeon: Teodoro Spray, MD;  Location: [facility] ORS;  Service: Cardiovascular;  Laterality: N/A;    Social History   Socioeconomic History   Marital status: Widowed    Spouse name: Not on file   Number of children: Not on file   Years of education: Not on file   Highest education level: Not on file  Occupational History   Not on file  Social Needs   Financial resource strain: Not on file   Food insecurity    Worry: Not on file    Inability: Not on file   Transportation needs    Medical: Not on file    Non-medical: Not on file  Tobacco Use   Smoking status: Current Every Day Smoker    Packs/day: 0.50    Years: 40.00    Pack years: 20.00   Smokeless tobacco: Never Used  Substance and Sexual Activity   Alcohol use: Not Currently    Comment: 40 oz today   Drug use: Not Currently    Types: IV, Cocaine, Heroin, Marijuana    Comment: heroin, states he used to use cocaine   Sexual activity: Not on file  Lifestyle   Physical activity    Days per week: Not on file    Minutes per session: Not  on file   Stress: Not on file  Relationships   Social connections    Talks on phone: Not on file    Gets together: Not on file    Attends religious service: Not on file    Active member of club or organization: Not on file    Attends meetings of clubs or organizations: Not on file    Relationship status: Not on file   Intimate partner violence    Fear of current or ex partner: Not on file    Emotionally abused: Not on file    Physically abused: Not on file    Forced sexual activity: Not on file  Other Topics Concern   Not on file  Social History Narrative   Not on file    Family History  Problem Relation Age of Onset   Multiple myeloma Mother      Current Outpatient Medications:    cyclobenzaprine (FLEXERIL) 10 MG tablet, Take 1 tablet (10 mg total) by mouth 3 (three) times daily as needed., Disp: 30 tablet, Rfl: 0   lactulose (CHRONULAC) 10 GM/15ML solution, Take 30  mLs (20 g total) by mouth 2 (two) times daily. Hold for greater than 2 loose stools daily, Disp: 236 mL, Rfl: 0   Multiple Vitamin (MULTIVITAMIN WITH MINERALS) TABS tablet, Take 1 tablet by mouth daily., Disp: 30 tablet, Rfl: 0   nadolol (CORGARD) 20 MG tablet, Take 20 mg by mouth daily., Disp: , Rfl:    oxyCODONE (OXY IR/ROXICODONE) 5 MG immediate release tablet, Take 1-2 tablets (5-10 mg total) by mouth every 4 (four) hours as needed for moderate pain or severe pain., Disp: 14 tablet, Rfl: 0   pantoprazole (PROTONIX) 40 MG tablet, Take 1 tablet (40 mg total) by mouth daily., Disp: 30 tablet, Rfl: 2   traMADol (ULTRAM) 50 MG tablet, Take 1 tablet (50 mg total) by mouth every 6 (six) hours as needed., Disp: 30 tablet, Rfl: 0   traMADol (ULTRAM) 50 MG tablet, Take 1 tablet (50 mg total) by mouth every 6 (six) hours as needed., Disp: 30 tablet, Rfl: 0  Physical exam:  Vitals:   01/21/19 1409  BP: 122/77  Pulse: 73  Resp: 18  Temp: (!) 93.7 F (34.3 C)  TempSrc: Tympanic  Weight: 131 lb (59.4 kg)   Physical Exam Constitutional:      Appearance: Normal appearance.  HENT:     Head: Normocephalic and atraumatic.  Eyes:     Pupils: Pupils are equal, round, and reactive to light.  Neck:     Musculoskeletal: Normal range of motion.  Cardiovascular:     Rate and Rhythm: Normal rate and regular rhythm.     Heart sounds: Normal heart sounds. No murmur.  Pulmonary:     Effort: Pulmonary effort is normal.     Breath sounds: Normal breath sounds. No wheezing.  Abdominal:     General: Bowel sounds are normal. There is distension.     Palpations: Abdomen is soft. There is splenomegaly.     Tenderness: There is no abdominal tenderness.  Musculoskeletal: Normal range of motion.  Skin:    General: Skin is warm and dry.     Coloration: Skin is jaundiced.     Findings: No rash.  Neurological:     Mental Status: He is alert and oriented to person, place, and time.  Psychiatric:         Judgment: Judgment normal.      CMP Latest Ref Rng & Units  01/21/2019  Glucose 70 - 99 mg/dL 106(H)  BUN 8 - 23 mg/dL 11  Creatinine 0.61 - 1.24 mg/dL 0.45(L)  Sodium 135 - 145 mmol/L 137  Potassium 3.5 - 5.1 mmol/L 3.8  Chloride 98 - 111 mmol/L 109  CO2 22 - 32 mmol/L 21(L)  Calcium 8.9 - 10.3 mg/dL 8.1(L)  Total Protein 6.5 - 8.1 g/dL 7.3  Total Bilirubin 0.3 - 1.2 mg/dL 4.4(H)  Alkaline Phos 38 - 126 U/L 293(H)  AST 15 - 41 U/L 392(H)  ALT 0 - 44 U/L 98(H)   CBC Latest Ref Rng & Units 01/21/2019  WBC 4.0 - 10.5 K/uL 3.9(L)  Hemoglobin 13.0 - 17.0 g/dL 9.1(L)  Hematocrit 39.0 - 52.0 % 28.4(L)  Platelets 150 - 400 K/uL 119(L)    No images are attached to the encounter.  Dg Chest 1 View  Result Date: 01/09/2019 CLINICAL DATA:  Abnormal respirations. Cirrhosis with hepatocellular carcinoma. EXAM: CHEST  1 VIEW COMPARISON:  Chest radiograph 01/03/2019. CT chest 01/03/2019. FINDINGS: Heart size is increased. Tortuous aorta. Sclerotic bone metastases better visualized on CT. Possible RIGHT paravertebral mass at T8. low lung volumes. Mild vascular congestion. No consolidation or edema. IMPRESSION: Low lung volumes. Cardiomegaly with mild vascular congestion. No consolidation or edema. Possible RIGHT T8 paravertebral mass. Electronically Signed   By: Staci Righter M.D.   On: 01/09/2019 08:29   Dg Chest 2 View  Result Date: 01/03/2019 CLINICAL DATA:  Low back pain for 2 months. History of hepatitis C, IV drug use, liver cancer. EXAM: CHEST - 2 VIEW COMPARISON:  Chest x-rays dated 04/17/2018 and 02/13/2018. FINDINGS: The heart size and mediastinal contours are within normal limits. Both lungs are clear. The visualized skeletal structures are unremarkable. IMPRESSION: No active cardiopulmonary disease. Electronically Signed   By: Franki Cabot M.D.   On: 01/03/2019 17:41   [name] Contrast  Result Date: 01/08/2019 CLINICAL DATA:  Altered mental status EXAM: MRI HEAD WITHOUT CONTRAST  TECHNIQUE: Multiplanar, multiecho pulse sequences of the brain and surrounding structures were obtained without intravenous contrast. COMPARISON:  Head CT 10/14/2011 FINDINGS: Examination is markedly motion degraded. BRAIN: There is no acute infarct, acute hemorrhage or extra-axial collection. The midline structures are normal. Multifocal white matter hyperintensity, most commonly due to chronic ischemic microangiopathy. Mild generalized volume loss. No midline shift or other mass effect. VASCULAR: The major intracranial arterial and venous sinus flow voids are normal. SKULL AND UPPER CERVICAL SPINE: Calvarial bone marrow signal is normal. There is no skull base mass. Visualized upper cervical spine and soft tissues are normal. SINUSES/ORBITS: No fluid levels or advanced mucosal thickening. No mastoid or middle ear effusion. The orbits are normal. IMPRESSION: Mild chronic small vessel disease and brain volume loss without acute intracranial abnormality. Electronically Signed   By: Ulyses Jarred M.D.   On: 01/08/2019 19:58   Mr Cervical Spine W Wo Contrast  Result Date: 01/05/2019 CLINICAL DATA:  Metastatic hepatocellular carcinoma with vertebral column metastases. Back pain and hematemesis. EXAM: MRI TOTAL SPINE WITHOUT AND WITH CONTRAST TECHNIQUE: Multisequence MR imaging of the spine from the cervical spine to the sacrum was performed prior to and following IV contrast administration for evaluation of spinal metastatic disease. CONTRAST:  5 mL Gadavist COMPARISON:  Thoracic spine MRI 04/20/2018 FINDINGS: MRI CERVICAL SPINE FINDINGS Alignment: Straightening of normal lordosis.  No static subluxation. Vertebrae: There is collapse of the superior endplate of T1 without marrow edema. There is abnormal T1-weighted signal and contrast enhancement within the right posterior  elements of C7, with edema and enhancement also of the left facet joint, with a less masslike appearance. There is a small enhancing lesion at  C3. Cord: Normal signal and caliber.  No epidural abnormality. Posterior Fossa, vertebral arteries, paraspinal tissues: Posterior fossa is normal. Vertebral artery flow voids are normal. No prevertebral effusion. Disc levels: C2-3: Mild disc bulge without spinal canal or neural foraminal stenosis. C3-4: Intermediate disc osteophyte complex with severe right and mild left foraminal stenosis. Narrowing of the ventral thecal sac without spinal canal stenosis. C4-5: Small disc osteophyte complex with mild right foraminal narrowing. No central spinal canal stenosis. C5-6: Mild uncovertebral hypertrophy without spinal canal or neural foraminal stenosis. C6-7: Small disc bulge without spinal canal or neural foraminal stenosis. C7-T1: Severe narrowing of the disc space with subsidence of the inferior C7 endplate into the superior T1 endplate. As above, there is abnormal contrast enhancement of the posterior elements with a mass lesion in the right T1 lamina that extends into the spinous process. MRI THORACIC SPINE FINDINGS Alignment:  Physiologic. Vertebrae: There are multiple metastatic lesions within the thoracic spine: 1. T1 right posterior elements. 2. T2 vertebral body with mild superior endplate collapse. 3. T4 left pedicle with encroachment on the T3-4 and T4-5 left neural foramina. 4. T8 vertebral body lesion occupying most of the vertebral body with early extension into the right pedicle and encroachment into the right T7-8 and T8-9 neural foramina. 5. T10 spinous process. Cord:  Normal signal and caliber. Paraspinal and other soft tissues: Unremarkable Disc levels: T3-4: Mild narrowing of the left neural foramen due to tumor within the left T4 pedicle. T4-5: Mild left foraminal stenosis due to left T4 pedicle tumor, contacting the exiting left C4 nerve root. T7-8: Moderate right foraminal stenosis due to tumor encroachment from the expansile portion of the T8 tumor in the right pedicle. No spinal canal stenosis.  T8-9: Mild encroachment into the right neural foramen from the T8 right pedicle tumor. MRI LUMBAR SPINE FINDINGS Segmentation:  Standard. Alignment:  Physiologic. Vertebrae: 1. Large tumor within the right L1 vertebral body extending into the posterior elements there is encroachment into the right lateral aspect of the thecal sac. 2. Bulky lesion in the right posterior aspect of the L3 vertebral body that encroaches on the right lateral aspect of the thecal sac. 3. Abnormal marrow signal in the left pedicle and posterior vertebral body of L4 without expansile lesion. 4. Nonexpansile left pedicle lesion of L5. 5. Small lesion at the dorsal aspect of S1. Conus medullaris: Extends to the L1 level and appears normal. Paraspinal and other soft tissues: The visualized vascular, retroperitoneal and paraspinal structures are normal. Disc levels: L1-2: Narrowing of the right subarticular recess by epidural tumor encroachment, also causing moderate right foraminal stenosis. No central spinal canal stenosis. L2-3: There is narrowing of the inferior right lateral recess due to tumor extension from the bulky L3 lesion, displacing the descending right L3 nerve root. There is no central spinal canal or neural foraminal stenosis. L3-4: There is narrowing of the right lateral recess along the length of the L3 vertebral body due to tumor extension into the epidural space, displacing the descending right L3 and L4 nerve roots. There is no central spinal canal or neural foraminal stenosis. L4-5: No spinal canal or neural foraminal stenosis. L5-S1: No spinal canal or neural foraminal stenosis. IMPRESSION: 1. Widespread spinal metastatic disease, worst in the lumbar spine, where there is epidural tumor extension at the L1 and L3 levels, narrowing  the right lateral recesses and encroaching on the right neural foramina. 2. Tumor encroachment on the left T3, left T4, right T7 and right T8 neural foramina. 3. Mild cervical metastatic disease  without epidural encroachment. 4. No pathologic fracture. Electronically Signed   By: Ulyses Jarred M.D.   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 Back pain and hematemesis. EXAM: MRI TOTAL SPINE WITHOUT AND WITH CONTRAST TECHNIQUE: Multisequence MR imaging of the spine from the cervical spine to the sacrum was performed prior to and following IV contrast administration for evaluation of spinal metastatic disease. CONTRAST:  5 mL Gadavist COMPARISON:  Thoracic spine MRI 04/20/2018 FINDINGS: MRI CERVICAL SPINE FINDINGS Alignment: Straightening of normal lordosis.  No static subluxation. Vertebrae: There is collapse of the superior endplate of T1 without marrow edema. There is abnormal T1-weighted signal and contrast enhancement within the right posterior elements of C7, with edema and enhancement also of the left facet joint, with a less masslike appearance. There is a small enhancing lesion at C3. Cord: Normal signal and caliber.  No epidural abnormality. Posterior Fossa, vertebral arteries, paraspinal tissues: Posterior fossa is normal. Vertebral artery flow voids are normal. No prevertebral effusion. Disc levels: C2-3: Mild disc bulge without spinal canal or neural foraminal stenosis. C3-4: Intermediate disc osteophyte complex with severe right and mild left foraminal stenosis. Narrowing of the ventral thecal sac without spinal canal stenosis. C4-5: Small disc osteophyte complex with mild right foraminal narrowing. No central spinal canal stenosis. C5-6: Mild uncovertebral hypertrophy without spinal canal or neural foraminal stenosis. C6-7: Small disc bulge without spinal canal or neural foraminal stenosis. C7-T1: Severe narrowing of the disc space with subsidence of the inferior C7 endplate into the superior T1 endplate. As above, there is abnormal contrast enhancement of the posterior  elements with a mass lesion in the right T1 lamina that extends into the spinous process. MRI THORACIC SPINE FINDINGS Alignment:  Physiologic. Vertebrae: There are multiple metastatic lesions within the thoracic spine: 1. T1 right posterior elements. 2. T2 vertebral body with mild superior endplate collapse. 3. T4 left pedicle with encroachment on the T3-4 and T4-5 left neural foramina. 4. T8 vertebral body lesion occupying most of the vertebral body with early extension into the right pedicle and encroachment into the right T7-8 and T8-9 neural foramina. 5. T10 spinous process. Cord:  Normal signal and caliber. Paraspinal and other soft tissues: Unremarkable Disc levels: T3-4: Mild narrowing of the left neural foramen due to tumor within the left T4 pedicle. T4-5: Mild left foraminal stenosis due to left T4 pedicle tumor, contacting the exiting left C4 nerve root. T7-8: Moderate right foraminal stenosis due to tumor encroachment from the expansile portion of the T8 tumor in the right pedicle. No spinal canal stenosis. T8-9: Mild encroachment into the right neural foramen from the T8 right pedicle tumor. MRI LUMBAR SPINE FINDINGS Segmentation:  Standard. Alignment:  Physiologic. Vertebrae: 1. Large tumor within the right L1 vertebral body extending into the posterior elements there is encroachment into the right lateral aspect of the thecal sac. 2. Bulky lesion in the right posterior aspect of the L3 vertebral body that encroaches on the right lateral aspect of the thecal sac. 3. Abnormal marrow signal in the left pedicle and posterior vertebral body of L4 without expansile lesion. 4. Nonexpansile left pedicle lesion of L5. 5. Small lesion at the dorsal aspect of S1. Conus medullaris:  Extends to the L1 level and appears normal. Paraspinal and other soft tissues: The visualized vascular, retroperitoneal and paraspinal structures are normal. Disc levels: L1-2: Narrowing of the right subarticular recess by epidural  tumor encroachment, also causing moderate right foraminal stenosis. No central spinal canal stenosis. L2-3: There is narrowing of the inferior right lateral recess due to tumor extension from the bulky L3 lesion, displacing the descending right L3 nerve root. There is no central spinal canal or neural foraminal stenosis. L3-4: There is narrowing of the right lateral recess along the length of the L3 vertebral body due to tumor extension into the epidural space, displacing the descending right L3 and L4 nerve roots. There is no central spinal canal or neural foraminal stenosis. L4-5: No spinal canal or neural foraminal stenosis. L5-S1: No spinal canal or neural foraminal stenosis. IMPRESSION: 1. 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 2. Tumor encroachment on the left T3, left T4, right T7 and right T8 neural foramina. 3. Mild cervical metastatic disease without epidural encroachment. 4. No pathologic fracture. Electronically Signed   By: Ulyses Jarred M.D.   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 Back pain and hematemesis. EXAM: MRI TOTAL SPINE WITHOUT AND WITH CONTRAST TECHNIQUE: Multisequence MR imaging of the spine from the cervical spine to the sacrum was performed prior to and following IV contrast administration for evaluation of spinal metastatic disease. CONTRAST:  5 mL Gadavist COMPARISON:  Thoracic spine MRI 04/20/2018 FINDINGS: MRI CERVICAL SPINE FINDINGS Alignment: Straightening of normal lordosis.  No static subluxation. Vertebrae: There is collapse of the superior endplate of T1 without marrow edema. There is abnormal T1-weighted signal and contrast enhancement within the right posterior elements of C7,  with edema and enhancement also of the left facet joint, with a less masslike appearance. There is a small enhancing lesion at C3. Cord: Normal signal and caliber.  No epidural abnormality. Posterior Fossa, vertebral arteries, paraspinal tissues: Posterior fossa is normal. Vertebral artery flow voids are normal. No prevertebral effusion. Disc levels: C2-3: Mild disc bulge without spinal canal or neural foraminal stenosis. C3-4: Intermediate disc osteophyte complex with severe right and mild left foraminal stenosis. Narrowing of the ventral thecal sac without spinal canal stenosis. C4-5: Small disc osteophyte complex with mild right foraminal narrowing. No central spinal canal stenosis. C5-6: Mild uncovertebral hypertrophy without spinal canal or neural foraminal stenosis. C6-7: Small disc bulge without spinal canal or neural foraminal stenosis. C7-T1: Severe narrowing of the disc space with subsidence of the inferior C7 endplate into the superior T1 endplate. As above, there is abnormal contrast enhancement of the posterior elements with a mass lesion in the right T1 lamina that extends into the spinous process. MRI THORACIC SPINE FINDINGS Alignment:  Physiologic. Vertebrae: There are multiple metastatic lesions within the thoracic spine: 1. T1 right posterior elements. 2. T2 vertebral body with mild superior endplate collapse. 3. T4 left pedicle with encroachment on the T3-4 and T4-5 left neural foramina. 4. T8 vertebral body lesion occupying most of the vertebral body with early extension into the right pedicle and encroachment into the right T7-8 and T8-9 neural foramina. 5. T10 spinous process. Cord:  Normal signal and caliber. Paraspinal and other soft tissues: Unremarkable Disc levels: T3-4: Mild narrowing  of the left neural foramen due to tumor within the left T4 pedicle. T4-5: Mild left foraminal stenosis due to left T4 pedicle tumor, contacting the exiting left C4 nerve root. T7-8: Moderate right foraminal  stenosis due to tumor encroachment from the expansile portion of the T8 tumor in the right pedicle. No spinal canal stenosis. T8-9: Mild encroachment into the right neural foramen from the T8 right pedicle tumor. MRI LUMBAR SPINE FINDINGS Segmentation:  Standard. Alignment:  Physiologic. Vertebrae: 1. Large tumor within the right L1 vertebral body extending into the posterior elements there is encroachment into the right lateral aspect of the thecal sac. 2. Bulky lesion in the right posterior aspect of the L3 vertebral body that encroaches on the right lateral aspect of the thecal sac. 3. Abnormal marrow signal in the left pedicle and posterior vertebral body of L4 without expansile lesion. 4. Nonexpansile left pedicle lesion of L5. 5. Small lesion at the dorsal aspect of S1. Conus medullaris: Extends to the L1 level and appears normal. Paraspinal and other soft tissues: The visualized vascular, retroperitoneal and paraspinal structures are normal. Disc levels: L1-2: Narrowing of the right subarticular recess by epidural tumor encroachment, also causing moderate right foraminal stenosis. No central spinal canal stenosis. L2-3: There is narrowing of the inferior right lateral recess due to tumor extension from the bulky L3 lesion, displacing the descending right L3 nerve root. There is no central spinal canal or neural foraminal stenosis. L3-4: There is narrowing of the right lateral recess along the length of the L3 vertebral body due to tumor extension into the epidural space, displacing the descending right L3 and L4 nerve roots. There is no central spinal canal or neural foraminal stenosis. L4-5: No spinal canal or neural foraminal stenosis. L5-S1: No spinal canal or neural foraminal stenosis. IMPRESSION: 1. 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 2. Tumor encroachment on the left T3, left T4, right T7 and right T8 neural foramina. 3. Mild cervical metastatic disease without epidural encroachment. 4. No pathologic fracture. Electronically Signed   By: Ulyses Jarred M.D.   On: 01/05/2019 19:56   US Paracentesis  Result Date: 01/22/2019 INDICATION: History of hepatitis C and hepatocellular carcinoma, now with symptomatic intra-abdominal ascites. Request made for ultrasound-guided paracentesis for diagnostic and therapeutic purposes. EXAM: ULTRASOUND-GUIDED PARACENTESIS COMPARISON:  None. MEDICATIONS: None. COMPLICATIONS: None immediate. TECHNIQUE: Informed written consent was obtained from the patient after a discussion of the risks, benefits and alternatives to treatment. A timeout was performed prior to the initiation of the procedure. Initial ultrasound scanning demonstrates a moderate amount of ascites within the right lower abdominal quadrant. The right lower abdomen was prepped and draped in the usual sterile fashion. 1% lidocaine with epinephrine was used for local anesthesia. An ultrasound image was saved for documentation purposed. An 8 Fr Safe-T-Centesis catheter was introduced. The paracentesis was performed. The catheter was removed and a dressing was applied. The patient tolerated the procedure well without immediate post procedural complication. FINDINGS: A total of approximately 1.9 liters of serous fluid was removed. Samples were sent to the laboratory as requested by the clinical team. IMPRESSION: Successful ultrasound-guided paracentesis yielding 1.9 liters of peritoneal fluid. Electronically Signed   By: Sandi Mariscal M.D.   On: 01/22/2019 15:35   Ct Angio Chest/abd/pel For Dissection W And/or W/wo  Result Date: 01/03/2019 CLINICAL DATA:  Low back and generalized abdominal pain for 2 months. EXAM: CT ANGIOGRAPHY CHEST, ABDOMEN AND PELVIS TECHNIQUE: Multidetector CT imaging through the chest, abdomen and pelvis was performed  using the standard protocol during bolus administration of intravenous contrast. Multiplanar reconstructed images and MIPs were obtained and reviewed to evaluate the vascular anatomy. CONTRAST:  100 mL ISOVUE-370 IOPAMIDOL (ISOVUE-370) INJECTION 76% COMPARISON:  CT chest 10/16/2018. CT angiogram abdomen and pelvis 08/27/2018. FINDINGS: CTA CHEST FINDINGS Cardiovascular: Preferential opacification of the thoracic aorta. No evidence of thoracic aortic aneurysm or dissection. Normal heart size. No pericardial effusion. Mediastinum/Nodes: No enlarged mediastinal, hilar, or axillary lymph nodes. Thyroid gland, trachea, and esophagus demonstrate no significant findings. Lungs/Pleura: No pleural effusion. Lungs demonstrate mild dependent atelectasis. There is some emphysematous change in the apices. No nodule, mass or consolidative process. Musculoskeletal: Lytic lesions are seen and T2 and T3. Mild superior endplate compression fracture of T2 noted. Mixed lytic and sclerotic lesion is also present at T8. Paraspinous and epidural soft tissue tumor on the right are seen extending out of the T8 vertebral body. Tumor contacts the cord and extends into the right T7-8 foramen. There is a lytic lesion in the left T4 pedicle and medial margin of the left fourth rib. Review of the MIP images confirms the above findings. CTA ABDOMEN AND PELVIS FINDINGS VASCULAR Aorta: Normal caliber aorta without aneurysm, dissection, vasculitis or significant stenosis. Celiac: Patent without evidence of aneurysm, dissection, vasculitis or significant stenosis. SMA: Patent without evidence of aneurysm, dissection, vasculitis or significant stenosis. Renals: Both renal arteries are patent without evidence of aneurysm, dissection, vasculitis, fibromuscular dysplasia or significant stenosis. Small accessory renal arteries on the right and left noted. IMA: Patent without evidence of aneurysm, dissection, vasculitis or significant stenosis. Inflow:  Patent without evidence of aneurysm, dissection, vasculitis or significant stenosis. Veins: No obvious venous abnormality within the limitations of this arterial phase study. Review of the MIP images confirms the above findings. NON-VASCULAR Hepatobiliary: The liver is markedly cirrhotic. Round lesion in the liver measuring 4.0 x 4.5 cm in the right hepatic lobe consistent with the patient's history of hepatocellular carcinoma is noted. 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 Biliary tree is unremarkable. Vascular coils in the porta hepatis are identified. Pancreas: Unremarkable. No pancreatic ductal dilatation or surrounding inflammatory changes. Spleen: No focal lesion.  Splenomegaly. Adrenals/Urinary Tract: Renal cysts are noted. The kidneys otherwise appear normal. Ureters and urinary bladder are unremarkable. Adrenal glands appear normal. Stomach/Bowel: Stomach is within normal limits. Appendix appears normal. No evidence of bowel wall thickening, distention, or inflammatory changes. Lymphatic: No lymphadenopathy. Reproductive: Prostate is unremarkable. Other: No free or focal fluid collection. Musculoskeletal: There is a large lytic lesion and soft tissue mass in the posterior aspect of the right L1 vertebral body and pedicle. Tumor extends into the right L1-2 neural foramen and extends into the right epidural space. Second destructive lesion is seen on the right at L3 where epidural tumor extends into the thecal sac and right L3-4 foramen. Smaller lytic lesions in L4, L5 and pelvis are noted. Review of the MIP images confirms the above findings. IMPRESSION: Negative for aortic dissection or aneurysm. No acute abnormality chest, abdomen or pelvis. Markedly cirrhotic liver with a mass consistent with the patient's known a hepatocellular carcinoma. Osseous metastatic disease has worsened and is most notable at T8, L1 and L3 where there is destruction of bone and epidural tumor as well as tumor extending into the  neural foramina as described above. These findings could be better evaluated  with MRI with and without contrast. Electronically Signed   By: Inge Rise M.D.   On: 01/03/2019 21:28     Assessment and plan- Patient is a 63 y.o. male presents to [facility] for abdominal distention x2 days.  Hepatocellular carcinoma: Found to have a 4 cm liver lesion back in October 2019.  He is status post Y 90 ablation in April 2020.  Has significant history of IV drug use and alcoholism.  Was evaluated by [facility] for possible liver transplant and he was found not to be a candidate.  He is currently receiving palliative radiation to his lumbar spine for metastatic bony disease.  He is scheduled for repeat liver MRI on 02/11/2019 and assessment with results by Dr. Grayland Ormond on 02/13/2019.  Abdominal distention/liver failure: Ascites due to underlying liver cancer.  On assessment, patient's abdomen very distended and hard to touch.  No rebound tenderness.  Active bowel sounds.  Hepatomegaly present.  Paracentesis as soon as possible.  Hyperbilirubinemia/jaundice: Bilirubin 4.4 today.  Previously 2.8 on 01/03/2019.  Continue to monitor.  Directly related to worsening hepatocellular carcinoma.  Plan: Stat labs.  Bilirubin 4.4, AST 392, ALT 98, PLT 119 Stat ultrasound-guided paracentesis: Scheduled patient for paracentesis to be completed stat.  Patient refused and wished to go home.  Rescheduled his paracentesis for tomorrow at 230 after his radiation.  Patient would let us know if he will have that procedure done.  Results: 1.9 L of fluid removed on 01/22/2019.  Fluid sent for cytology. We will reach out to Dr. Vicente Males for further recommendations.  Spoke to Dr. Grayland Ormond with regards to findings and at this point he is not a good candidate for systemic treatment. Will refill patient's tramadol.  Prescription sent to pharmacy  Disposition: RTC for paracentesis tomorrow. Return daily for radiation. Has a scheduled MRI of the liver  on 02/11/2019.  This may need to be moved up given his labs are getting worse. Return to clinic as scheduled for review of MRI with Dr. Grayland Ormond and further treatment planning. Patient will need to be seen by palliative care. Continue tramadol for pain.  Visit Diagnosis 1. Hepatocellular carcinoma ([place])     Patient expressed understanding and was in agreement with this plan. He also understands that He can call clinic at any time with any questions, concerns, or complaints.   Greater than 50% was spent in counseling and coordination of care with this patient including but not limited to discussion of the relevant topics above (See A&P) including, but not limited to diagnosis and management of acute and chronic medical conditions.   Thank you for allowing me to participate in the care of this very pleasant patient.   Jacquelin Hawking, NP [place] [facility] - [phone] Pager- [phone] 01/23/2019 1:05 PM

## 2019-01-21 NOTE — Telephone Encounter (Signed)
He is being seen in [facility] this afternoon

## 2019-01-21 NOTE — Telephone Encounter (Signed)
I will need to see him in clinic for a face-to-face evaluation. Not sure when his next appt is.

## 2019-01-22 ENCOUNTER — Ambulatory Visit
Admission: RE | Admit: 2019-01-22 | Discharge: 2019-01-22 | Disposition: A | Discharge status: Home / Self Care | Payer: Medicare Other | Source: Ambulatory Visit | Attending: Radiation Oncology | Admitting: Radiation Oncology

## 2019-01-22 ENCOUNTER — Ambulatory Visit
Admission: RE | Admit: 2019-01-22 | Discharge: 2019-01-22 | Disposition: A | Discharge status: Home / Self Care | Payer: Medicare Other | Source: Ambulatory Visit | Attending: Oncology | Admitting: Oncology

## 2019-01-22 ENCOUNTER — Other Ambulatory Visit: Payer: Self-pay

## 2019-01-22 ENCOUNTER — Telehealth: Payer: Self-pay | Admitting: Radiation Oncology

## 2019-01-22 DIAGNOSIS — R188 Other ascites: Secondary | ICD-10-CM | POA: Diagnosis present

## 2019-01-22 DIAGNOSIS — R140 Abdominal distension (gaseous): Secondary | ICD-10-CM | POA: Diagnosis present

## 2019-01-22 DIAGNOSIS — C220 Liver cell carcinoma: Secondary | ICD-10-CM

## 2019-01-22 DIAGNOSIS — C7951 Secondary malignant neoplasm of bone: Secondary | ICD-10-CM | POA: Diagnosis not present

## 2019-01-22 LAB — AFP TUMOR MARKER: AFP, Serum, Tumor Marker: 62554 ng/mL — ABNORMAL HIGH (ref 0.0–8.3)

## 2019-01-22 NOTE — Procedures (Signed)
Pre Procedural Dx: Symptomatic Ascites Post Procedural Dx: Same  Successful US guided paracentesis yielding 1.9 L of serous ascitic fluid. Sample sent to laboratory as requested.  EBL: None  Complications: None immediate  Ronny Bacon, MD Pager #: [phone]

## 2019-01-22 NOTE — Telephone Encounter (Signed)
Spoke with pt relative [name], sister to pt, to confirm appt date/time, do pre-appt screen which was completed, and adv of Covid-19 guidelines for appt regarding screening questions, temperature check, face mask required, and no visitors allowed

## 2019-01-22 NOTE — Telephone Encounter (Signed)
Per discussion with Lorretta Harp, NP, she spoke with [facility] yesterday after being seen in [facility], She advised patient should stay with his brother at this time

## 2019-01-23 ENCOUNTER — Other Ambulatory Visit: Payer: Self-pay

## 2019-01-23 ENCOUNTER — Ambulatory Visit: Payer: Medicare Other

## 2019-01-23 ENCOUNTER — Ambulatory Visit
Admission: RE | Admit: 2019-01-23 | Discharge: 2019-01-23 | Disposition: A | Discharge status: Home / Self Care | Payer: Medicare Other | Source: Ambulatory Visit | Attending: Radiation Oncology | Admitting: Radiation Oncology

## 2019-01-23 DIAGNOSIS — C7951 Secondary malignant neoplasm of bone: Secondary | ICD-10-CM | POA: Diagnosis not present

## 2019-01-24 ENCOUNTER — Other Ambulatory Visit: Payer: Self-pay

## 2019-01-24 ENCOUNTER — Ambulatory Visit: Payer: Medicare Other

## 2019-01-24 ENCOUNTER — Ambulatory Visit
Admission: RE | Admit: 2019-01-24 | Discharge: 2019-01-24 | Disposition: A | Discharge status: Home / Self Care | Payer: Medicare Other | Source: Ambulatory Visit | Attending: Radiation Oncology | Admitting: Radiation Oncology

## 2019-01-24 DIAGNOSIS — C7951 Secondary malignant neoplasm of bone: Secondary | ICD-10-CM | POA: Diagnosis not present

## 2019-01-24 LAB — CYTOLOGY - NON PAP

## 2019-01-27 ENCOUNTER — Ambulatory Visit: Payer: Medicare Other

## 2019-01-27 ENCOUNTER — Other Ambulatory Visit: Payer: Self-pay | Admitting: *Deleted

## 2019-01-27 MED ORDER — OXYCODONE HCL 5 MG PO TABS: 5.0000 mg | ORAL_TABLET | ORAL | 0 refills | Status: DC | PRN

## 2019-01-27 NOTE — Telephone Encounter (Signed)
Jeanette informed that Andrew Gibbs can take Lasix as needed. She is unsure if [facility] drew labs or not. She reports that he is having back pain and also pain at the site of his paracentesis. He is using tramadol, but is out of Oxycodone and requests a refill

## 2019-01-27 NOTE — Telephone Encounter (Signed)
He can take it as needed.  Is anyone checking labs on him?

## 2019-01-27 NOTE — Telephone Encounter (Signed)
Patient PCP had given him Lasix 40 mg for fluid in his abdominal and since he has had the fluid drawn off, she is asking if he is supposed to keep taking the Lasix or not. Please advise

## 2019-01-29 ENCOUNTER — Telehealth: Payer: Self-pay | Admitting: *Deleted

## 2019-01-29 NOTE — Telephone Encounter (Signed)
Sister [name] called reporting that patient is not eating and is asking if it is alright for him to drink ensure with his stomach problems. Please advise

## 2019-01-29 NOTE — Telephone Encounter (Signed)
Andrew Gibbs informed of doctor response

## 2019-01-29 NOTE — Telephone Encounter (Signed)
Yes

## 2019-02-05 ENCOUNTER — Telehealth: Payer: Self-pay | Admitting: *Deleted

## 2019-02-05 NOTE — Telephone Encounter (Signed)
Freda Munro with [facility] called reporting that patient is having increased low back pain and distended firm abdomen. He saw his PCP yesterday  BP was low T 98/56. Today on her check, it was 100/62. She is asking if we need to see him or what can be done for him as his Oxycodone at this point is only dulling the pain and not controlling it. He generally does not feel well, his temp is 99, Abdominal girth is 91 cm, O2 sat 96%. Please advise if he needs to see [facility] or if he needs [place] and Paracentesis.

## 2019-02-05 NOTE — Telephone Encounter (Signed)
Dr. Grayland Ormond, do you think that this patient is a great candidate for palliative care?

## 2019-02-06 ENCOUNTER — Inpatient Hospital Stay: Payer: Medicare Other | Admitting: Nurse Practitioner

## 2019-02-06 ENCOUNTER — Other Ambulatory Visit: Payer: Self-pay | Admitting: *Deleted

## 2019-02-06 ENCOUNTER — Other Ambulatory Visit: Payer: Self-pay

## 2019-02-06 ENCOUNTER — Inpatient Hospital Stay
Admission: EM | Admit: 2019-02-06 | Discharge: 2019-02-10 | DRG: 442 | Disposition: A | Discharge status: Home Health | Payer: Medicare Other | Attending: Internal Medicine | Admitting: Internal Medicine

## 2019-02-06 ENCOUNTER — Encounter: Payer: Self-pay | Admitting: Emergency Medicine

## 2019-02-06 ENCOUNTER — Encounter: Payer: Self-pay | Admitting: Nurse Practitioner

## 2019-02-06 ENCOUNTER — Inpatient Hospital Stay: Payer: Medicare Other

## 2019-02-06 VITALS — BP 103/67 | HR 74 | Temp 98.7°F | Resp 18 | Wt 131.0 lb

## 2019-02-06 DIAGNOSIS — Z79899 Other long term (current) drug therapy: Secondary | ICD-10-CM | POA: Diagnosis not present

## 2019-02-06 DIAGNOSIS — B182 Chronic viral hepatitis C: Secondary | ICD-10-CM | POA: Diagnosis present

## 2019-02-06 DIAGNOSIS — F17210 Nicotine dependence, cigarettes, uncomplicated: Secondary | ICD-10-CM | POA: Diagnosis present

## 2019-02-06 DIAGNOSIS — C220 Liver cell carcinoma: Secondary | ICD-10-CM | POA: Diagnosis present

## 2019-02-06 DIAGNOSIS — C7951 Secondary malignant neoplasm of bone: Secondary | ICD-10-CM | POA: Diagnosis present

## 2019-02-06 DIAGNOSIS — K766 Portal hypertension: Secondary | ICD-10-CM | POA: Diagnosis present

## 2019-02-06 DIAGNOSIS — K7200 Acute and subacute hepatic failure without coma: Secondary | ICD-10-CM | POA: Diagnosis present

## 2019-02-06 DIAGNOSIS — Z807 Family history of other malignant neoplasms of lymphoid, hematopoietic and related tissues: Secondary | ICD-10-CM | POA: Diagnosis not present

## 2019-02-06 DIAGNOSIS — E876 Hypokalemia: Secondary | ICD-10-CM | POA: Diagnosis present

## 2019-02-06 DIAGNOSIS — Z6820 Body mass index (BMI) 20.0-20.9, adult: Secondary | ICD-10-CM | POA: Diagnosis not present

## 2019-02-06 DIAGNOSIS — D696 Thrombocytopenia, unspecified: Secondary | ICD-10-CM | POA: Diagnosis present

## 2019-02-06 DIAGNOSIS — D649 Anemia, unspecified: Secondary | ICD-10-CM | POA: Diagnosis present

## 2019-02-06 DIAGNOSIS — K7031 Alcoholic cirrhosis of liver with ascites: Secondary | ICD-10-CM | POA: Diagnosis present

## 2019-02-06 DIAGNOSIS — I8510 Secondary esophageal varices without bleeding: Secondary | ICD-10-CM | POA: Diagnosis present

## 2019-02-06 DIAGNOSIS — K7290 Hepatic failure, unspecified without coma: Secondary | ICD-10-CM | POA: Diagnosis present

## 2019-02-06 DIAGNOSIS — R188 Other ascites: Secondary | ICD-10-CM | POA: Diagnosis not present

## 2019-02-06 DIAGNOSIS — K7210 Chronic hepatic failure without coma: Secondary | ICD-10-CM | POA: Diagnosis present

## 2019-02-06 DIAGNOSIS — R64 Cachexia: Secondary | ICD-10-CM | POA: Diagnosis present

## 2019-02-06 DIAGNOSIS — Z20828 Contact with and (suspected) exposure to other viral communicable diseases: Secondary | ICD-10-CM | POA: Diagnosis present

## 2019-02-06 LAB — COMPREHENSIVE METABOLIC PANEL
ALT: 200 U/L — ABNORMAL HIGH (ref 0–44)
AST: 1241 U/L — ABNORMAL HIGH (ref 15–41)
Albumin: 1.7 g/dL — ABNORMAL LOW (ref 3.5–5.0)
Alkaline Phosphatase: 265 U/L — ABNORMAL HIGH (ref 38–126)
Anion gap: 7 (ref 5–15)
BUN: 11 mg/dL (ref 8–23)
CO2: 23 mmol/L (ref 22–32)
Calcium: 8.1 mg/dL — ABNORMAL LOW (ref 8.9–10.3)
Chloride: 103 mmol/L (ref 98–111)
Creatinine, Ser: 0.7 mg/dL (ref 0.61–1.24)
GFR calc Af Amer: >60 mL/min (ref >60)
GFR calc non Af Amer: >60 mL/min (ref >60)
Glucose, Bld: 161 mg/dL — ABNORMAL HIGH (ref 70–99)
Potassium: 3.6 mmol/L (ref 3.5–5.1)
Sodium: 133 mmol/L — ABNORMAL LOW (ref 135–145)
Total Bilirubin: 8.1 mg/dL — ABNORMAL HIGH (ref 0.3–1.2)
Total Protein: 7.6 g/dL (ref 6.5–8.1)

## 2019-02-06 LAB — CBC WITH DIFFERENTIAL/PLATELET
Abs Immature Granulocytes: 0.1 10*3/uL — ABNORMAL HIGH (ref 0.00–0.07)
Basophils Absolute: 0 10*3/uL (ref 0.0–0.1)
Basophils Relative: 0 %
Eosinophils Absolute: 0.1 10*3/uL (ref 0.0–0.5)
Eosinophils Relative: 1 %
HCT: 28 % — ABNORMAL LOW (ref 39.0–52.0)
Hemoglobin: 9.3 g/dL — ABNORMAL LOW (ref 13.0–17.0)
Immature Granulocytes: 2 %
Lymphocytes Relative: 7 %
Lymphs Abs: 0.3 10*3/uL — ABNORMAL LOW (ref 0.7–4.0)
MCH: 32.5 pg (ref 26.0–34.0)
MCHC: 33.2 g/dL (ref 30.0–36.0)
MCV: 97.9 fL (ref 80.0–100.0)
Monocytes Absolute: 0.5 10*3/uL (ref 0.1–1.0)
Monocytes Relative: 10 %
Neutro Abs: 3.9 10*3/uL (ref 1.7–7.7)
Neutrophils Relative %: 80 %
Platelets: 94 10*3/uL — ABNORMAL LOW (ref 150–400)
RBC: 2.86 MIL/uL — ABNORMAL LOW (ref 4.22–5.81)
RDW: 25.8 % — ABNORMAL HIGH (ref 11.5–15.5)
WBC: 4.8 10*3/uL (ref 4.0–10.5)
nRBC: 0 % (ref 0.0–0.2)

## 2019-02-06 LAB — PROTIME-INR
INR: 1.4 — ABNORMAL HIGH (ref 0.8–1.2)
Prothrombin Time: 16.8 seconds — ABNORMAL HIGH (ref 11.4–15.2)

## 2019-02-06 LAB — AMMONIA: Ammonia: 35 umol/L (ref 9–35)

## 2019-02-06 LAB — ACETAMINOPHEN LEVEL: Acetaminophen (Tylenol), Serum: <10 ug/mL — ABNORMAL LOW (ref 10–30)

## 2019-02-06 LAB — SARS CORONAVIRUS 2 BY RT PCR (HOSPITAL ORDER, PERFORMED IN ~~LOC~~ HOSPITAL LAB): SARS Coronavirus 2: NEGATIVE

## 2019-02-06 MED ORDER — PANTOPRAZOLE SODIUM 40 MG PO TBEC
40.0000 mg | DELAYED_RELEASE_TABLET | Freq: Every day | ORAL | Status: DC
  Administered 2019-02-07 – 2019-02-10 (×4): 40 mg via ORAL
  Filled 2019-02-06 (×4): qty 1

## 2019-02-06 MED ORDER — ONDANSETRON HCL 4 MG PO TABS: 4.0000 mg | ORAL_TABLET | Freq: Four times a day (QID) | ORAL | Status: DC | PRN

## 2019-02-06 MED ORDER — LACTULOSE 10 GM/15ML PO SOLN
20.0000 g | Freq: Two times a day (BID) | ORAL | Status: DC
  Administered 2019-02-07 – 2019-02-10 (×8): 20 g via ORAL
  Filled 2019-02-06 (×8): qty 30

## 2019-02-06 MED ORDER — OXYCODONE HCL 5 MG PO TABS
5.0000 mg | ORAL_TABLET | ORAL | Status: DC | PRN
  Administered 2019-02-07 – 2019-02-10 (×8): 5 mg via ORAL
  Filled 2019-02-06 (×8): qty 1

## 2019-02-06 MED ORDER — ONDANSETRON HCL 4 MG/2ML IJ SOLN: 4.0000 mg | Freq: Four times a day (QID) | INTRAMUSCULAR | Status: DC | PRN

## 2019-02-06 MED ORDER — NADOLOL 20 MG PO TABS
20.0000 mg | ORAL_TABLET | Freq: Every day | ORAL | Status: DC
  Administered 2019-02-07 – 2019-02-10 (×2): 20 mg via ORAL
  Filled 2019-02-06 (×4): qty 1

## 2019-02-06 NOTE — Progress Notes (Signed)
Patient here for evaluation in the symptom management clinic for abdominal distension. Patient denies having pain at this time, but states that he is very uncomfortable and feels pressure in his abdomen.

## 2019-02-06 NOTE — ED Provider Notes (Signed)
[name] Emergency Department Provider Note  ____________________________________________  Time seen: Approximately 5:52 PM  I have reviewed the triage vital signs and the nursing notes.   HISTORY  Chief Complaint Abnormal Labs    HPI LEN Andrew Gibbs is a 63 y.o. male with a history of chronic hepatitis C and hepatocellular carcinoma, managed by cancer center Dr. Grayland Ormond, who was sent to the ED today at the instruction of his cancer center team due to worsening of his liver enzyme tests.  Patient reports generalized weakness, but denies any focal pain, fevers or chills, vomiting, black or bloody stool.  No chest pain or shortness of breath.  Symptoms are constant without aggravating or alleviating factors, gradual onset.  He reports compliance with his medications.      Past Medical History:  Diagnosis Date  . Blood dyscrasia    thrombocytopenia  . Hepatitis C   . Heroin use      Patient Active Problem List   Diagnosis Date Noted  . Neoplasm related pain   . Palliative care encounter   . Hematemesis with nausea   . GI bleed 01/03/2019  . Candidemia ([place]) 07/23/2018  . Discitis of cervical region 07/23/2018  . 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 Hepatocellular carcinoma ([place]) 05/17/2018  . Malnutrition of moderate degree 04/28/2018  . Abscess of right arm 04/17/2018     Past Surgical History:  Procedure Laterality Date  . ESOPHAGOGASTRODUODENOSCOPY N/A 01/06/2019   Procedure: ESOPHAGOGASTRODUODENOSCOPY (EGD);  Surgeon: Lucilla Lame, MD;  Location: [facility] ENDOSCOPY;  Service: Endoscopy;  Laterality: N/A;  . ESOPHAGOGASTRODUODENOSCOPY (EGD) WITH PROPOFOL N/A 12/24/2018   Procedure: ESOPHAGOGASTRODUODENOSCOPY (EGD) WITH PROPOFOL;  Surgeon: Jonathon Bellows, MD;  Location: [facility] ENDOSCOPY;  Service: Gastroenterology;  Laterality: N/A;  . IR ANGIOGRAM SELECTIVE EACH ADDITIONAL VESSEL  10/17/2018  . IR ANGIOGRAM SELECTIVE EACH ADDITIONAL VESSEL   10/17/2018  . IR ANGIOGRAM SELECTIVE EACH ADDITIONAL VESSEL  10/17/2018  . IR ANGIOGRAM SELECTIVE EACH ADDITIONAL VESSEL  10/17/2018  . IR ANGIOGRAM SELECTIVE EACH ADDITIONAL VESSEL  10/17/2018  . IR ANGIOGRAM SELECTIVE EACH ADDITIONAL VESSEL  10/17/2018  . IR ANGIOGRAM SELECTIVE EACH ADDITIONAL VESSEL  10/17/2018  . IR ANGIOGRAM SELECTIVE EACH ADDITIONAL VESSEL  11/05/2018  . IR ANGIOGRAM SELECTIVE EACH ADDITIONAL VESSEL  11/05/2018  . IR ANGIOGRAM SELECTIVE EACH ADDITIONAL VESSEL  11/05/2018  . IR ANGIOGRAM VISCERAL SELECTIVE  10/17/2018  . IR ANGIOGRAM VISCERAL SELECTIVE  10/17/2018  . IR ANGIOGRAM VISCERAL SELECTIVE  11/05/2018  . IR EMBO ARTERIAL NOT HEMORR HEMANG INC GUIDE ROADMAPPING  10/17/2018  . IR EMBO TUMOR ORGAN ISCHEMIA INFARCT INC GUIDE ROADMAPPING  11/05/2018  . IR RADIOLOGIST EVAL & MGMT  05/22/2018  . IR RADIOLOGIST EVAL & MGMT  08/27/2018  . IR US GUIDE VASC ACCESS RIGHT  10/17/2018  . IR US GUIDE VASC ACCESS RIGHT  11/05/2018  . KNEE SURGERY    . TEE WITHOUT CARDIOVERSION N/A 04/22/2018   Procedure: TRANSESOPHAGEAL ECHOCARDIOGRAM (TEE);  Surgeon: Teodoro Spray, MD;  Location: [facility] ORS;  Service: Cardiovascular;  Laterality: N/A;     Prior to Admission medications   Medication Sig Start Date End Date Taking? Authorizing Provider  cyclobenzaprine (FLEXERIL) 10 MG tablet Take 1 tablet (10 mg total) by mouth 3 (three) times daily as needed. 12/02/18   Fisher, Linden Dolin, PA-C  lactulose (CHRONULAC) 10 GM/15ML solution Take 30 mLs (20 g total) by mouth 2 (two) times daily. Hold for greater than 2 loose stools daily 01/13/19  Stark Jock Jude, MD  Multiple Vitamin (MULTIVITAMIN WITH MINERALS) TABS tablet Take 1 tablet by mouth daily. 05/03/18   Epifanio Lesches, MD  nadolol (CORGARD) 20 MG tablet Take 20 mg by mouth daily. 12/25/18   [provider]  oxyCODONE (OXY IR/ROXICODONE) 5 MG immediate release tablet Take 1-2 tablets (5-10 mg total) by mouth every 4 (four) hours as needed for moderate  pain or severe pain. 01/27/19   Lloyd Huger, MD  pantoprazole (PROTONIX) 40 MG tablet Take 1 tablet (40 mg total) by mouth daily. 11/05/18   Corrie Mckusick, DO  traMADol (ULTRAM) 50 MG tablet Take 1 tablet (50 mg total) by mouth every 6 (six) hours as needed. 01/14/19   Lloyd Huger, MD  traMADol (ULTRAM) 50 MG tablet Take 1 tablet (50 mg total) by mouth every 6 (six) hours as needed. 01/21/19   Jacquelin Hawking, NP     Allergies Patient has no known allergies.   Family History  Problem Relation Age of Onset  . Multiple myeloma Mother     Social History Social History   Tobacco Use  . Smoking status: Current Every Day Smoker    Packs/day: 0.50    Years: 40.00    Pack years: 20.00  . Smokeless tobacco: Never Used  Substance Use Topics  . Alcohol use: Not Currently    Comment: 40 oz today  . Drug use: Not Currently    Types: IV, Cocaine, Heroin, Marijuana    Comment: heroin, states he used to use cocaine    Review of Systems  Constitutional:   No fever or chills.  ENT:   No sore throat. No rhinorrhea. Cardiovascular:   No chest pain or syncope. Respiratory:   No dyspnea or cough. Gastrointestinal:   Negative for abdominal pain, vomiting and diarrhea.  Musculoskeletal:   Negative for focal pain or swelling All other systems reviewed and are negative except as documented above in ROS and HPI.  ____________________________________________   PHYSICAL EXAM:  VITAL SIGNS: ED Triage Vitals [02/06/19 1505]  Enc Vitals Group     BP (!) 101/59     Pulse Rate 69     Resp 18     Temp 98.4 F (36.9 C)     Temp Source Oral     SpO2 97 %     Weight 131 lb (59.4 kg)     Height      Head Circumference      Peak Flow      Pain Score 0     Pain Loc      Pain Edu?      Excl. in [place]?     Vital signs reviewed, nursing assessments reviewed.   Constitutional:   Alert and oriented. Non-toxic appearance. Eyes:   Conjunctivae are icteric. EOMI. PERRL. ENT       Head:   Normocephalic and atraumatic.      Nose:   No congestion/rhinnorhea.       Mouth/Throat:   MMM, no pharyngeal erythema. No peritonsillar mass.       Neck:   No meningismus. Full ROM. Hematological/Lymphatic/Immunilogical:   No cervical lymphadenopathy. Cardiovascular:   RRR. Symmetric bilateral radial and DP pulses.  No murmurs. Cap refill less than 2 seconds. Respiratory:   Normal respiratory effort without tachypnea/retractions. Breath sounds are clear and equal bilaterally. No wheezes/rales/rhonchi. Gastrointestinal:   Soft and nontender.  Moderately distended with ascites.  No rebound, rigidity, or guarding. Musculoskeletal:   Normal range of motion in  all extremities. No joint effusions.  No lower extremity tenderness.  No edema. Neurologic:   Normal speech and language.  Motor grossly intact. No acute focal neurologic deficits are appreciated.  Skin:    Skin is warm, dry and intact. No rash noted.  No petechiae, purpura, or bullae.  ____________________________________________    LABS (pertinent positives/negatives) (all labs ordered are listed, but only abnormal results are displayed) Labs Reviewed  SARS CORONAVIRUS 2 ([place] [facility])  AMMONIA  PROTIME-INR   ____________________________________________   EKG    ____________________________________________    RADIOLOGY  No results found.  ____________________________________________   PROCEDURES Procedures  ____________________________________________    CLINICAL IMPRESSION / ASSESSMENT AND PLAN / ED COURSE  Medications ordered in the ED: Medications - No data to display  Pertinent labs & imaging results that were available during my care of the patient were reviewed by me and considered in my medical decision making (see chart for details).  Andrew Gibbs was evaluated in Emergency Department on 02/06/2019 for the symptoms described in the history of  present illness. He was evaluated in the context of the global COVID-19 pandemic, which necessitated consideration that the patient might be at risk for infection with the SARS-CoV-2 virus that causes COVID-19. Institutional protocols and algorithms that pertain to the evaluation of patients at risk for COVID-19 are in a state of rapid change based on information released by regulatory bodies including the CDC and federal and state organizations. These policies and algorithms were followed during the patient's care in the ED.   Patient sent to the ED due to abnormal labs.  His labs obtained earlier today do show an AST of 1200, ALT of 200, total bilirubin of 8.1, ALT acutely worsened compared to 2 weeks ago.  He is hemoglobin is stable, and no reason to suspect bacterial peritonitis or GI bleed at this time.  He is not significantly encephalopathic.  I will check a INR, ammonia level.  COVID screening.  Discussed with hospitalist for further management.      ____________________________________________   FINAL CLINICAL IMPRESSION(S) / ED DIAGNOSES    Final diagnoses:  Acute liver failure without hepatic coma     ED Discharge Orders    None      Portions of this note were generated with dragon dictation software. Dictation errors may occur despite best attempts at proofreading.   Carrie Mew, MD 02/06/19 1754

## 2019-02-06 NOTE — ED Notes (Signed)
IV team at bedside at this time. 

## 2019-02-06 NOTE — Telephone Encounter (Signed)
Pt is coming to [facility] appt @ 11:30. ok'd per Ander Purpura. Please call brother during appt.

## 2019-02-06 NOTE — ED Notes (Signed)
ED TO INPATIENT HANDOFF REPORT  ED Nurse Name and Phone #: [phone]  S Name/Age/Gender Andrew Gibbs 63 y.o. male Room/Bed: ED14A/ED14A  Code Status   Code Status: Prior  Home/SNF/Other Home A/Ox4 Is this baseline? Yes   Triage Complete: Triage complete  Chief Complaint liver failure  Triage Note Pt presents to ED via [place] with c/o abnormal labs. Per family member cancer called earlier today told patient he was in liver failure. Pt's family member was told patient was going to be admitted through the ED. AST 1241. Labs done at the cancer center earlier today.    Allergies No Known Allergies  Level of Care/Admitting Diagnosis ED Disposition    ED Disposition Condition Comment   Admit  The patient appears reasonably stabilized for admission considering the current resources, flow, and capabilities available in the ED at this time, and I doubt any other [facility] requiring further screening and/or treatment in the ED prior to admission is  present.       B Medical/Surgery History Past Medical History:  Diagnosis Date  . Blood dyscrasia    thrombocytopenia  . Hepatitis C   . Heroin use    Past Surgical History:  Procedure Laterality Date  . ESOPHAGOGASTRODUODENOSCOPY N/A 01/06/2019   Procedure: ESOPHAGOGASTRODUODENOSCOPY (EGD);  Surgeon: Lucilla Lame, MD;  Location: [facility] ENDOSCOPY;  Service: Endoscopy;  Laterality: N/A;  . ESOPHAGOGASTRODUODENOSCOPY (EGD) WITH PROPOFOL N/A 12/24/2018   Procedure: ESOPHAGOGASTRODUODENOSCOPY (EGD) WITH PROPOFOL;  Surgeon: Jonathon Bellows, MD;  Location: [facility] ENDOSCOPY;  Service: Gastroenterology;  Laterality: N/A;  . IR ANGIOGRAM SELECTIVE EACH ADDITIONAL VESSEL  10/17/2018  . IR ANGIOGRAM SELECTIVE EACH ADDITIONAL VESSEL  10/17/2018  . IR ANGIOGRAM SELECTIVE EACH ADDITIONAL VESSEL  10/17/2018  . IR ANGIOGRAM SELECTIVE EACH ADDITIONAL VESSEL  10/17/2018  . IR ANGIOGRAM SELECTIVE EACH ADDITIONAL VESSEL  10/17/2018  . IR ANGIOGRAM SELECTIVE EACH ADDITIONAL  VESSEL  10/17/2018  . IR ANGIOGRAM SELECTIVE EACH ADDITIONAL VESSEL  10/17/2018  . IR ANGIOGRAM SELECTIVE EACH ADDITIONAL VESSEL  11/05/2018  . IR ANGIOGRAM SELECTIVE EACH ADDITIONAL VESSEL  11/05/2018  . IR ANGIOGRAM SELECTIVE EACH ADDITIONAL VESSEL  11/05/2018  . IR ANGIOGRAM VISCERAL SELECTIVE  10/17/2018  . IR ANGIOGRAM VISCERAL SELECTIVE  10/17/2018  . IR ANGIOGRAM VISCERAL SELECTIVE  11/05/2018  . IR EMBO ARTERIAL NOT HEMORR HEMANG INC GUIDE ROADMAPPING  10/17/2018  . IR EMBO TUMOR ORGAN ISCHEMIA INFARCT INC GUIDE ROADMAPPING  11/05/2018  . IR RADIOLOGIST EVAL & MGMT  05/22/2018  . IR RADIOLOGIST EVAL & MGMT  08/27/2018  . IR US GUIDE VASC ACCESS RIGHT  10/17/2018  . IR US GUIDE VASC ACCESS RIGHT  11/05/2018  . KNEE SURGERY    . TEE WITHOUT CARDIOVERSION N/A 04/22/2018   Procedure: TRANSESOPHAGEAL ECHOCARDIOGRAM (TEE);  Surgeon: Teodoro Spray, MD;  Location: [facility] ORS;  Service: Cardiovascular;  Laterality: N/A;     A IV Location/Drains/Wounds Patient Lines/Drains/Airways Status   Active Line/Drains/Airways    Name:   Placement date:   Placement time:   Site:   Days:   Peripheral IV 01/11/19 Right Forearm   01/11/19    2323    Forearm   26   External Urinary Catheter   01/10/19    2156    -   27          Intake/Output Last 24 hours No intake or output data in the 24 hours ending 02/06/19 1746  Labs/Imaging Results for orders placed or performed in visit on 02/06/19 (from the  past 48 hour(s))  Comprehensive metabolic panel     Status: Abnormal   Collection Time: 02/06/19 12:13 PM  Result Value Ref Range   Sodium 133 (L) 135 - 145 mmol/L   Potassium 3.6 3.5 - 5.1 mmol/L   Chloride 103 98 - 111 mmol/L   CO2 23 22 - 32 mmol/L   Glucose, Bld 161 (H) 70 - 99 mg/dL   BUN 11 8 - 23 mg/dL   Creatinine, Ser 0.70 0.61 - 1.24 mg/dL   Calcium 8.1 (L) 8.9 - 10.3 mg/dL   Total Protein 7.6 6.5 - 8.1 g/dL   Albumin 1.7 (L) 3.5 - 5.0 g/dL   AST 1,241 (H) 15 - 41 U/L   ALT 200 (H) 0 - 44 U/L    Alkaline Phosphatase 265 (H) 38 - 126 U/L   Total Bilirubin 8.1 (H) 0.3 - 1.2 mg/dL   GFR calc non Af Amer >60 >60 mL/min   GFR calc Af Amer >60 >60 mL/min   Anion gap 7 5 - 15    Comment: Performed at [facility], [address] [place], [place] [address]  CBC with Differential     Status: Abnormal   Collection Time: 02/06/19 12:13 PM  Result Value Ref Range   WBC 4.8 4.0 - 10.5 K/uL   RBC 2.86 (L) 4.22 - 5.81 MIL/uL   Hemoglobin 9.3 (L) 13.0 - 17.0 g/dL   HCT 28.0 (L) 39.0 - 52.0 %   MCV 97.9 80.0 - 100.0 fL   MCH 32.5 26.0 - 34.0 pg   MCHC 33.2 30.0 - 36.0 g/dL   RDW 25.8 (H) 11.5 - 15.5 %   Platelets 94 (L) 150 - 400 K/uL   nRBC 0.0 0.0 - 0.2 %   Neutrophils Relative % 80 %   Neutro Abs 3.9 1.7 - 7.7 K/uL   Lymphocytes Relative 7 %   Lymphs Abs 0.3 (L) 0.7 - 4.0 K/uL   Monocytes Relative 10 %   Monocytes Absolute 0.5 0.1 - 1.0 K/uL   Eosinophils Relative 1 %   Eosinophils Absolute 0.1 0.0 - 0.5 K/uL   Basophils Relative 0 %   Basophils Absolute 0.0 0.0 - 0.1 K/uL   Immature Granulocytes 2 %   Abs Immature Granulocytes 0.10 (H) 0.00 - 0.07 K/uL    Comment: Performed at [facility], [address] [place], [place] [address]   No results found.  Pending Labs Unresulted Labs (From admission, onward)    Start     Ordered   02/06/19 1625  SARS Coronavirus 2 (CEPHEID - Performed in [place] hospital lab), [facility] Order  (Asymptomatic Patients Labs)  ONCE - STAT,   STAT    Question:  Rule Out  Answer:  Yes   02/06/19 1624   02/06/19 1606  Ammonia  ONCE - STAT,   STAT     02/06/19 1605   02/06/19 1606  Protime-INR  Once,   STAT     02/06/19 1605          Vitals/Pain Today's Vitals   02/06/19 1505 02/06/19 1600 02/06/19 1630 02/06/19 1700  BP: (!) 101/59 113/68 115/79 125/71  Pulse: 69 65 68 72  Resp: 18 18 19 15   Temp: 98.4 F (36.9 C)     TempSrc: Oral     SpO2: 97% 96% 96% 97%  Weight: 59.4 kg     PainSc: 0-No pain       Isolation  Precautions No active isolations  Medications  Medications - No data to display  Mobility walks with device High fall risk     R Recommendations: See Admitting Provider Note  Report given to:   Additional Notes: pt w/cane at bedside.

## 2019-02-06 NOTE — ED Notes (Signed)
MD aware of INR 1.4

## 2019-02-06 NOTE — ED Notes (Signed)
Attempted to redraw labs, IV placed by IV team does not have blood return. Patient has been stuck multiple times for blood draws without success; will contact lab to come draw the blood   Phlebotomy states they will be up as soon as they can to draw labs, another RN will try in meantime

## 2019-02-06 NOTE — Telephone Encounter (Signed)
Let's have him see [facility] today.  Thanks!

## 2019-02-06 NOTE — Telephone Encounter (Signed)
Andrew Gibbs, can you please schedule him to be seen today at [facility]. Thanks you!

## 2019-02-06 NOTE — ED Notes (Signed)
Pt denies Cp, SHOB, fever, cough. Pt A/Ox4. Pt provided with blanket at this time

## 2019-02-06 NOTE — ED Notes (Signed)
PIV attempted, unsuccessful. Pt tolerated poorly. Primary RN notified pt may need IV team consult.

## 2019-02-06 NOTE — Progress Notes (Addendum)
Waiting for INR result, if INR is less than 1.5 patient can stay in this hospital otherwise patient need to be transferred to tertiary facility as per  recommendation from gastroenterology Dr. Jonathon Bellows, spoke with Dr. Joni Fears about this.

## 2019-02-06 NOTE — Progress Notes (Signed)
Symptom Management [place]  [phone] [phone]  Patient Care Team: Center, [facility] as PCP - General (General Practice) Clent Jacks, RN as Registered Nurse   Name of the patient: Andrew Gibbs  [id number]  Jul 02, 1956   Date of visit: 02/06/19  Diagnosis- hepatocellular carcinoma with bone mets  Chief complaint/ Reason for visit- ascites  Heme/Onc history:  Andrew Gibbs, initially presented in January 2020 for chronic hepatitis C and was diagnosed with decompensated liver cirrhosis with ascites.  He was found to have a 4 cm liver mass status post Y 90 ablation in April 2020.  His AFP was greater than 33,000.  He has a history of IV drug use but states he quit in October 2019, history of alcohol abuse, history of hepatitis C- treated in 1976.  His case was discussed at [facility] hepatobiliary conference and there was concern for autoimmune hepatitis.  He was found not to be a liver transplant candidate and it was suggested he undergo TACE plus or minus systemic therapy.  Patient felt not to be a good candidate for systemic therapy per Dr. Grayland Ormond. AFP has been followed and has been rising. Currently, plan for MRI of liver on 02/11/2019 for restaging.   He was hospitalized from 01/03/2019-01/13/2019 for back pain and upper GI bleed.  He was started on octreotide and Protonix infusions, lactulose for hyperkalemia, blood transfusion. Symptomatic of hepatic encephalopathy with ammonia of 99.  Unable to complete colonoscopy this patient was inability to do prep.  EGD on 12/24/2018 revealed esophageal varices.  Imaging found new bone mets and he is status post palliative radiation (01/13/2019-01/24/2019) with improvement in pain.  Oncology History  Hepatocellular carcinoma ([place])  05/17/2018 Initial Diagnosis   Hepatocellular carcinoma ([place])   05/17/2018 Cancer Staging   Staging form: Liver, AJCC 8th Edition - Clinical stage from  05/17/2018: Stage IB (cT1b, cN0, cM0) - Signed by Lloyd Huger, MD on 05/17/2018     Interval history-Andrew Gibbs, 63 year old male with above history of hepatocellular carcinoma presents to symptom management for abdominal distention.  He was seen in symptom management for similar symptoms on 01/21/2019 and had paracentesis on 01/22/2019 which yielded 1.9 L of serous ascitic fluid.  Cytology was negative for malignancy.  He says that he felt better, appetite improved, abdomen felt less tight after paracentesis.  Over past few days he says he is noticed abdomen refilling, oral intake is diminished.  No nausea, vomiting, diarrhea.  Back pain is improved since radiation.  Says he ate a few bites of cereal this morning.  Denies fever.  Denies melena.  His brothers and sisters participate in visit by phone due to COVID-19 disorder precautions and contribute to history.  ECOG FS:3 - Symptomatic, >50% confined to bed  Review of systems- Review of Systems  Constitutional: Positive for malaise/fatigue. Negative for chills, fever and weight loss.  HENT: Negative for congestion and sore throat.   Eyes: Negative for pain and redness.  Respiratory: Negative for cough, shortness of breath and wheezing.   Cardiovascular: Negative for chest pain, palpitations and leg swelling.  Gastrointestinal: Positive for abdominal pain (fullness). Negative for blood in stool, constipation, diarrhea, nausea and vomiting.  Genitourinary: Negative for dysuria, hematuria and urgency.  Musculoskeletal: Positive for back pain (improved). Negative for myalgias.  Skin: Negative for itching and rash.  Neurological: Positive for weakness. Negative for dizziness and headaches.  Psychiatric/Behavioral: Negative for depression. The patient is not nervous/anxious.  Current treatment- s/p Y-90 ablation 11/05/2018. S/p palliative radiation to lumbar spine  No Known Allergies  Past Medical History:  Diagnosis Date   Blood  dyscrasia    thrombocytopenia   Hepatitis C    Heroin use     Past Surgical History:  Procedure Laterality Date   ESOPHAGOGASTRODUODENOSCOPY N/A 01/06/2019   Procedure: ESOPHAGOGASTRODUODENOSCOPY (EGD);  Surgeon: Lucilla Lame, MD;  Location: [facility] ENDOSCOPY;  Service: Endoscopy;  Laterality: N/A;   ESOPHAGOGASTRODUODENOSCOPY (EGD) WITH PROPOFOL N/A 12/24/2018   Procedure: ESOPHAGOGASTRODUODENOSCOPY (EGD) WITH PROPOFOL;  Surgeon: Jonathon Bellows, MD;  Location: [facility] ENDOSCOPY;  Service: Gastroenterology;  Laterality: N/A;   IR ANGIOGRAM SELECTIVE EACH ADDITIONAL VESSEL  10/17/2018   IR ANGIOGRAM SELECTIVE EACH ADDITIONAL VESSEL  10/17/2018   IR ANGIOGRAM SELECTIVE EACH ADDITIONAL VESSEL  10/17/2018   IR ANGIOGRAM SELECTIVE EACH ADDITIONAL VESSEL  10/17/2018   IR ANGIOGRAM SELECTIVE EACH ADDITIONAL VESSEL  10/17/2018   IR ANGIOGRAM SELECTIVE EACH ADDITIONAL VESSEL  10/17/2018   IR ANGIOGRAM SELECTIVE EACH ADDITIONAL VESSEL  10/17/2018   IR ANGIOGRAM SELECTIVE EACH ADDITIONAL VESSEL  11/05/2018   IR ANGIOGRAM SELECTIVE EACH ADDITIONAL VESSEL  11/05/2018   IR ANGIOGRAM SELECTIVE EACH ADDITIONAL VESSEL  11/05/2018   IR ANGIOGRAM VISCERAL SELECTIVE  10/17/2018   IR ANGIOGRAM VISCERAL SELECTIVE  10/17/2018   IR ANGIOGRAM VISCERAL SELECTIVE  11/05/2018   IR EMBO ARTERIAL NOT HEMORR HEMANG INC GUIDE ROADMAPPING  10/17/2018   IR EMBO TUMOR ORGAN ISCHEMIA INFARCT INC GUIDE ROADMAPPING  11/05/2018   IR RADIOLOGIST EVAL & MGMT  05/22/2018   IR RADIOLOGIST EVAL & MGMT  08/27/2018   IR US GUIDE VASC ACCESS RIGHT  10/17/2018   IR US GUIDE VASC ACCESS RIGHT  11/05/2018   KNEE SURGERY     TEE WITHOUT CARDIOVERSION N/A 04/22/2018   Procedure: TRANSESOPHAGEAL ECHOCARDIOGRAM (TEE);  Surgeon: Teodoro Spray, MD;  Location: [facility] ORS;  Service: Cardiovascular;  Laterality: N/A;    Social History   Socioeconomic History   Marital status: Widowed    Spouse name: Not on file   Number of children: Not on file    Years of education: Not on file   Highest education level: Not on file  Occupational History   Not on file  Social Needs   Financial resource strain: Not on file   Food insecurity    Worry: Not on file    Inability: Not on file   Transportation needs    Medical: Not on file    Non-medical: Not on file  Tobacco Use   Smoking status: Current Every Day Smoker    Packs/day: 0.50    Years: 40.00    Pack years: 20.00   Smokeless tobacco: Never Used  Substance and Sexual Activity   Alcohol use: Not Currently    Comment: 40 oz today   Drug use: Not Currently    Types: IV, Cocaine, Heroin, Marijuana    Comment: heroin, states he used to use cocaine   Sexual activity: Not on file  Lifestyle   Physical activity    Days per week: Not on file    Minutes per session: Not on file   Stress: Not on file  Relationships   Social connections    Talks on phone: Not on file    Gets together: Not on file    Attends religious service: Not on file    Active member of club or organization: Not on file    Attends meetings of clubs or organizations: Not on  file    Relationship status: Not on file   Intimate partner violence    Fear of current or ex partner: Not on file    Emotionally abused: Not on file    Physically abused: Not on file    Forced sexual activity: Not on file  Other Topics Concern   Not on file  Social History Narrative   Not on file    Family History  Problem Relation Age of Onset   Multiple myeloma Mother      Current Outpatient Medications:    cyclobenzaprine (FLEXERIL) 10 MG tablet, Take 1 tablet (10 mg total) by mouth 3 (three) times daily as needed., Disp: 30 tablet, Rfl: 0   lactulose (CHRONULAC) 10 GM/15ML solution, Take 30 mLs (20 g total) by mouth 2 (two) times daily. Hold for greater than 2 loose stools daily, Disp: 236 mL, Rfl: 0   Multiple Vitamin (MULTIVITAMIN WITH MINERALS) TABS tablet, Take 1 tablet by mouth daily., Disp: 30 tablet,  Rfl: 0   nadolol (CORGARD) 20 MG tablet, Take 20 mg by mouth daily., Disp: , Rfl:    oxyCODONE (OXY IR/ROXICODONE) 5 MG immediate release tablet, Take 1-2 tablets (5-10 mg total) by mouth every 4 (four) hours as needed for moderate pain or severe pain., Disp: 14 tablet, Rfl: 0   pantoprazole (PROTONIX) 40 MG tablet, Take 1 tablet (40 mg total) by mouth daily., Disp: 30 tablet, Rfl: 2   traMADol (ULTRAM) 50 MG tablet, Take 1 tablet (50 mg total) by mouth every 6 (six) hours as needed., Disp: 30 tablet, Rfl: 0   traMADol (ULTRAM) 50 MG tablet, Take 1 tablet (50 mg total) by mouth every 6 (six) hours as needed., Disp: 30 tablet, Rfl: 0  Physical exam:  Vitals:   02/06/19 1135  BP: 103/67  Pulse: 74  Resp: 18  Temp: 98.7 F (37.1 C)  TempSrc: Tympanic  Weight: 131 lb (59.4 kg)   Physical Exam Constitutional:      General: He is not in acute distress.    Comments: Frail, chronically ill appearing; in wheelchair  HENT:     Head: Normocephalic.     Mouth/Throat:     Mouth: Mucous membranes are moist.     Pharynx: Oropharynx is clear.  Eyes:     General: Scleral icterus present.  Cardiovascular:     Rate and Rhythm: Normal rate and regular rhythm.  Pulmonary:     Effort: Pulmonary effort is normal.     Breath sounds: Normal breath sounds.  Abdominal:     Comments: Rounded abdomen; no rebound. Nontender. No guarding. Exam limited d/t ascites  Musculoskeletal:     Comments: Able to ambulate short distances w/o assistances  Skin:    General: Skin is warm and dry.     Coloration: Skin is not jaundiced.  Neurological:     Mental Status: He is alert and oriented to person, place, and time.  Psychiatric:        Mood and Affect: Affect is angry.        Behavior: Behavior normal.      CMP Latest Ref Rng & Units 02/07/2019  Glucose 70 - 99 mg/dL 111(H)  BUN 8 - 23 mg/dL 9  Creatinine 0.61 - 1.24 mg/dL 0.50(L)  Sodium 135 - 145 mmol/L 138  Potassium 3.5 - 5.1 mmol/L 3.3(L)    Chloride 98 - 111 mmol/L 105  CO2 22 - 32 mmol/L 24  Calcium 8.9 - 10.3 mg/dL 8.1(L)  Total Protein  6.5 - 8.1 g/dL 7.5  Total Bilirubin 0.3 - 1.2 mg/dL 8.1(H)  Alkaline Phos 38 - 126 U/L 264(H)  AST 15 - 41 U/L 1,313(H)  ALT 0 - 44 U/L 197(H)   CBC Latest Ref Rng & Units 02/07/2019  WBC 4.0 - 10.5 K/uL 5.0  Hemoglobin 13.0 - 17.0 g/dL 10.1(L)  Hematocrit 39.0 - 52.0 % 29.7(L)  Platelets 150 - 400 K/uL 104(L)    No images are attached to the encounter.  Dg Chest 1 View  Result Date: 01/09/2019 CLINICAL DATA:  Abnormal respirations. Cirrhosis with hepatocellular carcinoma. EXAM: CHEST  1 VIEW COMPARISON:  Chest radiograph 01/03/2019. CT chest 01/03/2019. FINDINGS: Heart size is increased. Tortuous aorta. Sclerotic bone metastases better visualized on CT. Possible RIGHT paravertebral mass at T8. low lung volumes. Mild vascular congestion. No consolidation or edema. IMPRESSION: Low lung volumes. Cardiomegaly with mild vascular congestion. No consolidation or edema. Possible RIGHT T8 paravertebral mass. Electronically Signed   By: Staci Righter M.D.   On: 01/09/2019 08:29   [name] Contrast  Result Date: 01/08/2019 CLINICAL DATA:  Altered mental status EXAM: MRI HEAD WITHOUT CONTRAST TECHNIQUE: Multiplanar, multiecho pulse sequences of the brain and surrounding structures were obtained without intravenous contrast. COMPARISON:  Head CT 10/14/2011 FINDINGS: Examination is markedly motion degraded. BRAIN: There is no acute infarct, acute hemorrhage or extra-axial collection. The midline structures are normal. Multifocal white matter hyperintensity, most commonly due to chronic ischemic microangiopathy. Mild generalized volume loss. No midline shift or other mass effect. VASCULAR: The major intracranial arterial and venous sinus flow voids are normal. SKULL AND UPPER CERVICAL SPINE: Calvarial bone marrow signal is normal. There is no skull base mass. Visualized upper cervical spine and soft  tissues are normal. SINUSES/ORBITS: No fluid levels or advanced mucosal thickening. No mastoid or middle ear effusion. The orbits are normal. IMPRESSION: Mild chronic small vessel disease and brain volume loss without acute intracranial abnormality. Electronically Signed   By: Ulyses Jarred M.D.   On: 01/08/2019 19:58   US Paracentesis  Result Date: 01/22/2019 INDICATION: History of hepatitis C and hepatocellular carcinoma, now with symptomatic intra-abdominal ascites. Request made for ultrasound-guided paracentesis for diagnostic and therapeutic purposes. EXAM: ULTRASOUND-GUIDED PARACENTESIS COMPARISON:  None. MEDICATIONS: None. COMPLICATIONS: None immediate. TECHNIQUE: Informed written consent was obtained from the patient after a discussion of the risks, benefits and alternatives to treatment. A timeout was performed prior to the initiation of the procedure. Initial ultrasound scanning demonstrates a moderate amount of ascites within the right lower abdominal quadrant. The right lower abdomen was prepped and draped in the usual sterile fashion. 1% lidocaine with epinephrine was used for local anesthesia. An ultrasound image was saved for documentation purposed. An 8 Fr Safe-T-Centesis catheter was introduced. The paracentesis was performed. The catheter was removed and a dressing was applied. The patient tolerated the procedure well without immediate post procedural complication. FINDINGS: A total of approximately 1.9 liters of serous fluid was removed. Samples were sent to the laboratory as requested by the clinical team. IMPRESSION: Successful ultrasound-guided paracentesis yielding 1.9 liters of peritoneal fluid. Electronically Signed   By: Sandi Mariscal M.D.   On: 01/22/2019 15:35    Assessment and plan- Patient is a 63 y.o. male diagnosed with hepatocellular carcinoma who presents to symptom management clinic for symptomatic ascites.  1. Acute Liver Failure-labs today consistent with acute liver  failure-AST 1200, ALT 200, total bilirubin of 8.1.  ALT acutely worse than 2 weeks ago.  Scleral icterus.  No  jaundice.  Afebrile and hemoglobin stable so bacterial peritonitis and/or GI bleed less likely.  Not encephalopathic; mood at baseline per nursing.  Question if secondary to progression of disease.  I discussed findings with Dr. Grayland Ormond who recommends calling patient to ER for COVID testing and hospital admission for further evaluation and management.  2.  Ascites-etiology likely multifactorial- cirrhosis-hep c-HCC & suspect progression-liver failure- had planned palliative paracentesis for patient with cytology, ldh, protein, culture & gram stain, to further evaluate ascitic fluid given that prior paracentesis (01/22/2019 yielded 1.9L) was negative for malignancy. However, given acute liver failure, will defer to ER/hospitalist.   3.  Hepatocellular carcinoma- s/p Y90 10/2018, not a candidate for liver transplant per [facility].  Suspect progression of disease.  Per Dr. Grayland Ormond, not felt to be a good candidate for systemic chemotherapy. Dr. Grayland Ormond to follow in hospital.   4. Bone metastases- s/p palliative radiation to lumbar spine with improvement in pain. Continue to monitor.   5. Goals of care- Per Dr. Grayland Ormond, not a good candidate for systemic chemotherapy.  Patient has been seen by palliative care and discussed goals of care.  Hospice was recommended however, family and patient requested full code. Today, patient expresses interest in comfort focused care at home however he & family state is not 'ready for hospice'.   Discussed plan of care with patient and his family.  He is agreeable and will go to ER for further evaluation and management.   Visit Diagnosis 1. Acute liver failure without hepatic coma   2. Hepatocellular carcinoma ([place])   3. Other ascites     Patient expressed understanding and was in agreement with this plan. He also understands that He can call clinic at any time with  any questions, concerns, or complaints.   Thank you for allowing me to participate in the care of this patient.   Beckey Rutter, DNP, AGNP-C [employer] [facility] [place] (work cell) [phone] (office)  CC: Dr. Grayland Ormond

## 2019-02-06 NOTE — ED Notes (Signed)
Waiting for admitting provider, patient resting

## 2019-02-06 NOTE — ED Notes (Signed)
Lab called again for lab draw, said they would be at bedside "shortly", will continue to monitor

## 2019-02-06 NOTE — H&P (Signed)
Andrew Gibbs at [place] NAME: Andrew Gibbs    MR#:  [id number]  DATE OF BIRTH:  1956/01/03  DATE OF ADMISSION:  02/06/2019  PRIMARY CARE PHYSICIAN: [facility] [place]   REQUESTING/REFERRING PHYSICIAN: Joni Fears, MD  CHIEF COMPLAINT:   Chief Complaint  Patient presents with  . Abnormal Labs    HISTORY OF PRESENT ILLNESS:  Andrew Gibbs  is a 63 y.o. male who presents with chief complaint as above.  Patient is sent to the ED from oncology clinic due to significantly elevated liver enzymes.  He does have hepatocellular carcinoma and is being followed by oncology for treatment for the same.  His liver enzymes on lab evaluation today were significantly elevated, with AST around 1200, and a total bilirubin of 8.  Patient denies any acute symptoms at this time.  Hospitalist were called for admission  PAST MEDICAL HISTORY:   Past Medical History:  Diagnosis Date  . Blood dyscrasia    thrombocytopenia  . Hepatitis C   . Heroin use      PAST SURGICAL HISTORY:   Past Surgical History:  Procedure Laterality Date  . ESOPHAGOGASTRODUODENOSCOPY N/A 01/06/2019   Procedure: ESOPHAGOGASTRODUODENOSCOPY (EGD);  Surgeon: Lucilla Lame, MD;  Location: [facility] ENDOSCOPY;  Service: Endoscopy;  Laterality: N/A;  . ESOPHAGOGASTRODUODENOSCOPY (EGD) WITH PROPOFOL N/A 12/24/2018   Procedure: ESOPHAGOGASTRODUODENOSCOPY (EGD) WITH PROPOFOL;  Surgeon: Jonathon Bellows, MD;  Location: [facility] ENDOSCOPY;  Service: Gastroenterology;  Laterality: N/A;  . IR ANGIOGRAM SELECTIVE EACH ADDITIONAL VESSEL  10/17/2018  . IR ANGIOGRAM SELECTIVE EACH ADDITIONAL VESSEL  10/17/2018  . IR ANGIOGRAM SELECTIVE EACH ADDITIONAL VESSEL  10/17/2018  . IR ANGIOGRAM SELECTIVE EACH ADDITIONAL VESSEL  10/17/2018  . IR ANGIOGRAM SELECTIVE EACH ADDITIONAL VESSEL  10/17/2018  . IR ANGIOGRAM SELECTIVE EACH ADDITIONAL VESSEL  10/17/2018  . IR ANGIOGRAM SELECTIVE EACH ADDITIONAL VESSEL  10/17/2018  .  IR ANGIOGRAM SELECTIVE EACH ADDITIONAL VESSEL  11/05/2018  . IR ANGIOGRAM SELECTIVE EACH ADDITIONAL VESSEL  11/05/2018  . IR ANGIOGRAM SELECTIVE EACH ADDITIONAL VESSEL  11/05/2018  . IR ANGIOGRAM VISCERAL SELECTIVE  10/17/2018  . IR ANGIOGRAM VISCERAL SELECTIVE  10/17/2018  . IR ANGIOGRAM VISCERAL SELECTIVE  11/05/2018  . IR EMBO ARTERIAL NOT HEMORR HEMANG INC GUIDE ROADMAPPING  10/17/2018  . IR EMBO TUMOR ORGAN ISCHEMIA INFARCT INC GUIDE ROADMAPPING  11/05/2018  . IR RADIOLOGIST EVAL & MGMT  05/22/2018  . IR RADIOLOGIST EVAL & MGMT  08/27/2018  . IR US GUIDE VASC ACCESS RIGHT  10/17/2018  . IR US GUIDE VASC ACCESS RIGHT  11/05/2018  . KNEE SURGERY    . TEE WITHOUT CARDIOVERSION N/A 04/22/2018   Procedure: TRANSESOPHAGEAL ECHOCARDIOGRAM (TEE);  Surgeon: Teodoro Spray, MD;  Location: [facility] ORS;  Service: Cardiovascular;  Laterality: N/A;     SOCIAL HISTORY:   Social History   Tobacco Use  . Smoking status: Current Every Day Smoker    Packs/day: 0.50    Years: 40.00    Pack years: 20.00  . Smokeless tobacco: Never Used  Substance Use Topics  . Alcohol use: Not Currently    Comment: 40 oz today     FAMILY HISTORY:   Family History  Problem Relation Age of Onset  . Multiple myeloma Mother      DRUG ALLERGIES:  No Known Allergies  MEDICATIONS AT HOME:   Prior to Admission medications   Medication Sig Start Date End Date Taking? Authorizing Provider  furosemide (LASIX) 40 MG tablet Take  40 mg by mouth daily. 01/21/19  Yes [provider]  Multiple Vitamin (MULTIVITAMIN WITH MINERALS) TABS tablet Take 1 tablet by mouth daily. 05/03/18  Yes Epifanio Lesches, MD  nadolol (CORGARD) 20 MG tablet Take 20 mg by mouth daily. 12/25/18  Yes [provider]  oxyCODONE (OXY IR/ROXICODONE) 5 MG immediate release tablet Take 1-2 tablets (5-10 mg total) by mouth every 4 (four) hours as needed for moderate pain or severe pain. 01/27/19  Yes Lloyd Huger, MD  pantoprazole  (PROTONIX) 40 MG tablet Take 1 tablet (40 mg total) by mouth daily. 11/05/18  Yes Corrie Mckusick, DO  traMADol (ULTRAM) 50 MG tablet Take 50 mg by mouth every 6 (six) hours as needed.   Yes [provider]  cyclobenzaprine (FLEXERIL) 10 MG tablet Take 1 tablet (10 mg total) by mouth 3 (three) times daily as needed. Patient not taking: Reported on 02/06/2019 12/02/18   Versie Starks, PA-C  lactulose (CHRONULAC) 10 GM/15ML solution Take 30 mLs (20 g total) by mouth 2 (two) times daily. Hold for greater than 2 loose stools daily 01/13/19   Otila Back, MD    REVIEW OF SYSTEMS:  Review of Systems  Constitutional: Negative for chills, fever, malaise/fatigue and weight loss.  HENT: Negative for ear pain, hearing loss and tinnitus.   Eyes: Negative for blurred vision, double vision, pain and redness.  Respiratory: Negative for cough, hemoptysis and shortness of breath.   Cardiovascular: Negative for chest pain, palpitations, orthopnea and leg swelling.  Gastrointestinal: Negative for abdominal pain, constipation, diarrhea, nausea and vomiting.  Genitourinary: Negative for dysuria, frequency and hematuria.  Musculoskeletal: Negative for back pain, joint pain and neck pain.  Skin:       No acne, rash, or lesions  Neurological: Negative for dizziness, tremors, focal weakness and weakness.  Endo/Heme/Allergies: Negative for polydipsia. Does not bruise/bleed easily.  Psychiatric/Behavioral: Negative for depression. The patient is not nervous/anxious and does not have insomnia.      VITAL SIGNS:   Vitals:   02/06/19 2200 02/06/19 2215 02/06/19 2230 02/06/19 2300  BP: 118/70  (!) 111/94 118/68  Pulse:  70 71 74  Resp: (!) 22 (!) 21 (!) 22 (!) 21  Temp:      TempSrc:      SpO2:  95% 95% 94%  Weight:       Wt Readings from Last 3 Encounters:  02/06/19 59.4 kg  02/06/19 59.4 kg  01/21/19 59.4 kg    PHYSICAL EXAMINATION:  Physical Exam  Vitals reviewed. Constitutional: He is oriented  to person, place, and time. He appears well-developed. No distress.  HENT:  Head: Normocephalic and atraumatic.  Mouth/Throat: Oropharynx is clear and moist.  Eyes: Pupils are equal, round, and reactive to light. Conjunctivae and EOM are normal. No scleral icterus.  Neck: Normal range of motion. Neck supple. No JVD present. No thyromegaly present.  Cardiovascular: Normal rate, regular rhythm and intact distal pulses. Exam reveals no gallop and no friction rub.  No murmur heard. Respiratory: Effort normal and breath sounds normal. No respiratory distress. He has no wheezes. He has no rales.  GI: Soft. Bowel sounds are normal. He exhibits no distension. There is no abdominal tenderness.  Musculoskeletal: Normal range of motion.        General: No edema.     Comments: No arthritis, no gout  Lymphadenopathy:    He has no cervical adenopathy.  Neurological: He is alert and oriented to person, place, and time. No cranial  nerve deficit.  No dysarthria, no aphasia  Skin: Skin is warm and dry. No rash noted. No erythema.  Psychiatric: He has a normal mood and affect. His behavior is normal. Judgment and thought content normal.    LABORATORY PANEL:   CBC Recent Labs  Lab 02/06/19 1213  WBC 4.8  HGB 9.3*  HCT 28.0*  PLT 94*   ------------------------------------------------------------------------------------------------------------------  Chemistries  Recent Labs  Lab 02/06/19 1213  NA 133*  K 3.6  CL 103  CO2 23  GLUCOSE 161*  BUN 11  CREATININE 0.70  CALCIUM 8.1*  AST 1,241*  ALT 200*  ALKPHOS 265*  BILITOT 8.1*   ------------------------------------------------------------------------------------------------------------------  Cardiac Enzymes No results for input(s): TROPONINI in the last 168 hours. ------------------------------------------------------------------------------------------------------------------  RADIOLOGY:  No results found.  EKG:   Orders  placed or performed during the hospital encounter of 01/03/19  . ED EKG  . ED EKG  . EKG 12-Lead  . EKG 12-Lead    IMPRESSION AND PLAN:  Principal Problem:   Liver failure ([place]) -unclear acute etiology.  Although almost certainly related to his liver cancer.  We will get GI consult. Active Problems:   Hepatocellular carcinoma [facility]) -oncology consult.  Patient was sent to the ED from oncology clinic when his elevated liver enzymes were detected.  Chart review performed and case discussed with ED provider. Labs, imaging and/or ECG reviewed by provider and discussed with patient/family. Management plans discussed with the patient and/or family.  COVID-19 status: Tested negative     DVT PROPHYLAXIS: Mechanical only  GI PROPHYLAXIS:  PPI   ADMISSION STATUS: Inpatient     CODE STATUS: Full Code Status History    Date Active Date Inactive Code Status Order ID Comments User Context   01/03/2019 2322 01/13/2019 2143 Full Code [id number]  Mayer Camel, NP ED   04/17/2018 1446 05/02/2018 1853 Full Code [id number]  Hillary Bow, MD ED   Advance Care Planning Activity      TOTAL TIME TAKING CARE OF THIS PATIENT: 45 minutes.   This patient was evaluated in the context of the global COVID-19 pandemic, which necessitated consideration that the patient might be at risk for infection with the SARS-CoV-2 virus that causes COVID-19. Institutional protocols and algorithms that pertain to the evaluation of patients at risk for COVID-19 are in a state of rapid change based on information released by regulatory bodies including the CDC and federal and state organizations. These policies and algorithms were followed to the best of this provider's knowledge to date during the patient's care at this facility.  Ethlyn Daniels 02/06/2019, 11:14 PM  [facility]  Office  [phone]  CC: Primary care physician; [facility], [facility]  Note:  This document was prepared using  Set designer software and may include unintentional dictation errors.

## 2019-02-06 NOTE — ED Notes (Signed)
Updated patient's sister [name] on admit status, still waiting for bed but RN will call back when bed is assigned

## 2019-02-06 NOTE — ED Triage Notes (Addendum)
Pt presents to ED via POV with c/o abnormal labs. Per family member cancer called earlier today told patient he was in liver failure. Pt's family member was told patient was going to be admitted through the ED. AST 1241. Labs done at the cancer center earlier today.

## 2019-02-06 NOTE — ED Notes (Signed)
Pt provided with a warm blanket and a meal tray/drink. Per pt's request.

## 2019-02-06 NOTE — ED Notes (Signed)
Labs drawn by Lorriane Shire, RN, walked personally to lab to assure they were received and were in good condition. MD notified, aware of issues with lab

## 2019-02-06 NOTE — ED Notes (Signed)
ED TO INPATIENT HANDOFF REPORT  ED Nurse Name and Phone #: Anda Kraft [phone]  S Name/Age/Gender Andrew Gibbs 63 y.o. male Room/Bed: ED14A/ED14A  Code Status   Code Status: Prior  Home/SNF/Other Home Patient oriented to: self, place, time and situation Is this baseline? Yes   Triage Complete: Triage complete  Chief Complaint liver failure  Triage Note Pt presents to ED via [place] with c/o abnormal labs. Per family member cancer called earlier today told patient he was in liver failure. Pt's family member was told patient was going to be admitted through the ED. AST 1241. Labs done at the cancer center earlier today.    Allergies No Known Allergies  Level of Care/Admitting Diagnosis ED Disposition    ED Disposition Condition [facility] Area: [place] [[id number]]  Level of Care: Med-Surg [16]  Covid Evaluation: Confirmed COVID Negative  Diagnosis: Liver failure [facility]) [[id number]]  Admitting Physician: Lance Coon [[id number]]  Attending Physician: Lance Coon [phone]  Estimated length of stay: past midnight tomorrow  Certification:: I certify this patient will need inpatient services for at least 2 midnights  PT Class (Do Not Modify): Inpatient [101]  PT Acc Code (Do Not Modify): Private [1]       B Medical/Surgery History Past Medical History:  Diagnosis Date  . Blood dyscrasia    thrombocytopenia  . Hepatitis C   . Heroin use    Past Surgical History:  Procedure Laterality Date  . ESOPHAGOGASTRODUODENOSCOPY N/A 01/06/2019   Procedure: ESOPHAGOGASTRODUODENOSCOPY (EGD);  Surgeon: Lucilla Lame, MD;  Location: [facility] ENDOSCOPY;  Service: Endoscopy;  Laterality: N/A;  . ESOPHAGOGASTRODUODENOSCOPY (EGD) WITH PROPOFOL N/A 12/24/2018   Procedure: ESOPHAGOGASTRODUODENOSCOPY (EGD) WITH PROPOFOL;  Surgeon: Jonathon Bellows, MD;  Location: [facility] ENDOSCOPY;  Service: Gastroenterology;  Laterality: N/A;  . IR ANGIOGRAM SELECTIVE EACH ADDITIONAL VESSEL   10/17/2018  . IR ANGIOGRAM SELECTIVE EACH ADDITIONAL VESSEL  10/17/2018  . IR ANGIOGRAM SELECTIVE EACH ADDITIONAL VESSEL  10/17/2018  . IR ANGIOGRAM SELECTIVE EACH ADDITIONAL VESSEL  10/17/2018  . IR ANGIOGRAM SELECTIVE EACH ADDITIONAL VESSEL  10/17/2018  . IR ANGIOGRAM SELECTIVE EACH ADDITIONAL VESSEL  10/17/2018  . IR ANGIOGRAM SELECTIVE EACH ADDITIONAL VESSEL  10/17/2018  . IR ANGIOGRAM SELECTIVE EACH ADDITIONAL VESSEL  11/05/2018  . IR ANGIOGRAM SELECTIVE EACH ADDITIONAL VESSEL  11/05/2018  . IR ANGIOGRAM SELECTIVE EACH ADDITIONAL VESSEL  11/05/2018  . IR ANGIOGRAM VISCERAL SELECTIVE  10/17/2018  . IR ANGIOGRAM VISCERAL SELECTIVE  10/17/2018  . IR ANGIOGRAM VISCERAL SELECTIVE  11/05/2018  . IR EMBO ARTERIAL NOT HEMORR HEMANG INC GUIDE ROADMAPPING  10/17/2018  . IR EMBO TUMOR ORGAN ISCHEMIA INFARCT INC GUIDE ROADMAPPING  11/05/2018  . IR RADIOLOGIST EVAL & MGMT  05/22/2018  . IR RADIOLOGIST EVAL & MGMT  08/27/2018  . IR US GUIDE VASC ACCESS RIGHT  10/17/2018  . IR US GUIDE VASC ACCESS RIGHT  11/05/2018  . KNEE SURGERY    . TEE WITHOUT CARDIOVERSION N/A 04/22/2018   Procedure: TRANSESOPHAGEAL ECHOCARDIOGRAM (TEE);  Surgeon: Teodoro Spray, MD;  Location: [facility] ORS;  Service: Cardiovascular;  Laterality: N/A;     A IV Location/Drains/Wounds Patient Lines/Drains/Airways Status   Active Line/Drains/Airways    Name:   Placement date:   Placement time:   Site:   Days:   Peripheral IV 02/06/19 Right;Medial Forearm   02/06/19    1904    Forearm   less than 1          Intake/Output Last 24  hours  Intake/Output Summary (Last 24 hours) at 02/06/2019 2308 Last data filed at 02/06/2019 1929 Gross per 24 hour  Intake -  Output 300 ml  Net -300 ml    Labs/Imaging Results for orders placed or performed during the hospital encounter of 02/06/19 (from the past 48 hour(s))  SARS Coronavirus 2 (CEPHEID - Performed in [place] hospital lab), Hosp Order     Status: None   Collection Time: 02/06/19  5:03 PM    Specimen: Nasopharyngeal Swab  Result Value Ref Range   SARS Coronavirus 2 NEGATIVE NEGATIVE    Comment: (NOTE) If result is NEGATIVE SARS-CoV-2 target nucleic acids are NOT DETECTED. The SARS-CoV-2 RNA is generally detectable in upper and lower  respiratory specimens during the acute phase of infection. The lowest  concentration of SARS-CoV-2 viral copies this assay can detect is 250  copies / mL. A negative result does not preclude SARS-CoV-2 infection  and should not be used as the sole basis for treatment or other  patient management decisions.  A negative result may occur with  improper specimen collection / handling, submission of specimen other  than nasopharyngeal swab, presence of viral mutation(s) within the  areas targeted by this assay, and inadequate number of viral copies  (<250 copies / mL). A negative result must be combined with clinical  observations, patient history, and epidemiological information. If result is POSITIVE SARS-CoV-2 target nucleic acids are DETECTED. The SARS-CoV-2 RNA is generally detectable in upper and lower  respiratory specimens dur ing the acute phase of infection.  Positive  results are indicative of active infection with SARS-CoV-2.  Clinical  correlation with patient history and other diagnostic information is  necessary to determine patient infection status.  Positive results do  not rule out bacterial infection or co-infection with other viruses. If result is PRESUMPTIVE POSTIVE SARS-CoV-2 nucleic acids MAY BE PRESENT.   A presumptive positive result was obtained on the submitted specimen  and confirmed on repeat testing.  While 2019 novel coronavirus  (SARS-CoV-2) nucleic acids may be present in the submitted sample  additional confirmatory testing may be necessary for epidemiological  and / or clinical management purposes  to differentiate between  SARS-CoV-2 and other Sarbecovirus currently known to infect humans.  If clinically  indicated additional testing with an alternate test  methodology [phone]) is advised. The SARS-CoV-2 RNA is generally  detectable in upper and lower respiratory sp ecimens during the acute  phase of infection. The expected result is Negative. Fact Sheet for Patients:  StrictlyIdeas.no Fact Sheet for Healthcare Providers: BankingDealers.co.za This test is not yet approved or cleared by the [place] FDA and has been authorized for detection and/or diagnosis of SARS-CoV-2 by FDA under an Emergency Use Authorization (EUA).  This EUA will remain in effect (meaning this test can be used) for the duration of the COVID-19 declaration under Section 564(b)(1) of the Act, 21 U.S.C. section 360bbb-3(b)(1), unless the authorization is terminated or revoked sooner. Performed at [facility], [address] [place], [place] [address]   Ammonia     Status: None   Collection Time: 02/06/19  8:58 PM  Result Value Ref Range   Ammonia 35 9 - 35 umol/L    Comment: Performed at [facility], [address] [place], [place] [address]  Protime-INR     Status: Abnormal   Collection Time: 02/06/19  8:58 PM  Result Value Ref Range   Prothrombin Time 16.8 (H) 11.4 - 15.2 seconds  INR 1.4 (H) 0.8 - 1.2    Comment: (NOTE) INR goal varies based on device and disease states. Performed at [facility], [address] [place], [place] [address]   Acetaminophen level     Status: Abnormal   Collection Time: 02/06/19  8:58 PM  Result Value Ref Range   Acetaminophen (Tylenol), Serum <10 (L) 10 - 30 ug/mL    Comment: (NOTE) Therapeutic concentrations vary significantly. A range of 10-30 ug/mL  may be an effective concentration for many patients. However, some  are best treated at concentrations outside of this range. Acetaminophen concentrations >150 ug/mL at 4 hours after ingestion  and >50 ug/mL at 12 hours after ingestion  are often associated with  toxic reactions. Performed at [facility], [address] [place], [place] [address]    No results found.  Pending Labs [employer] (From admission, onward)    Start     Ordered   Signed and Held  Comprehensive metabolic panel  Tomorrow morning,   R     Signed and Held   Signed and Held  CBC  Tomorrow morning,   R     Signed and Held          Vitals/Pain Today's Vitals   02/06/19 2200 02/06/19 2215 02/06/19 2230 02/06/19 2300  BP: 118/70  (!) 111/94 118/68  Pulse:  70 71 74  Resp: (!) 22 (!) 21 (!) 22 (!) 21  Temp:      TempSrc:      SpO2:  95% 95% 94%  Weight:      PainSc:        Isolation Precautions No active isolations  Medications Medications - No data to display  Mobility walks with person assist High fall risk   Focused Assessments Pulmonary Assessment Handoff:  Lung sounds:   O2 Device: Room Air        R Recommendations: See Admitting Provider Note  Report given to:   Additional Notes: hepatic carcinoma

## 2019-02-06 NOTE — ED Notes (Signed)
This RN attempted PIVx2 w/o success. IV team consult in place.

## 2019-02-07 ENCOUNTER — Inpatient Hospital Stay: Payer: Medicare Other

## 2019-02-07 DIAGNOSIS — D696 Thrombocytopenia, unspecified: Secondary | ICD-10-CM

## 2019-02-07 DIAGNOSIS — K7200 Acute and subacute hepatic failure without coma: Principal | ICD-10-CM

## 2019-02-07 DIAGNOSIS — D649 Anemia, unspecified: Secondary | ICD-10-CM

## 2019-02-07 DIAGNOSIS — C220 Liver cell carcinoma: Secondary | ICD-10-CM

## 2019-02-07 DIAGNOSIS — C7951 Secondary malignant neoplasm of bone: Secondary | ICD-10-CM

## 2019-02-07 DIAGNOSIS — R188 Other ascites: Secondary | ICD-10-CM

## 2019-02-07 LAB — CBC
HCT: 29.7 % — ABNORMAL LOW (ref 39.0–52.0)
Hemoglobin: 10.1 g/dL — ABNORMAL LOW (ref 13.0–17.0)
MCH: 32.5 pg (ref 26.0–34.0)
MCHC: 34 g/dL (ref 30.0–36.0)
MCV: 95.5 fL (ref 80.0–100.0)
Platelets: 104 10*3/uL — ABNORMAL LOW (ref 150–400)
RBC: 3.11 MIL/uL — ABNORMAL LOW (ref 4.22–5.81)
RDW: 25.6 % — ABNORMAL HIGH (ref 11.5–15.5)
WBC: 5 10*3/uL (ref 4.0–10.5)
nRBC: 0 % (ref 0.0–0.2)

## 2019-02-07 LAB — COMPREHENSIVE METABOLIC PANEL
ALT: 197 U/L — ABNORMAL HIGH (ref 0–44)
AST: 1313 U/L — ABNORMAL HIGH (ref 15–41)
Albumin: 1.6 g/dL — ABNORMAL LOW (ref 3.5–5.0)
Alkaline Phosphatase: 264 U/L — ABNORMAL HIGH (ref 38–126)
Anion gap: 9 (ref 5–15)
BUN: 9 mg/dL (ref 8–23)
CO2: 24 mmol/L (ref 22–32)
Calcium: 8.1 mg/dL — ABNORMAL LOW (ref 8.9–10.3)
Chloride: 105 mmol/L (ref 98–111)
Creatinine, Ser: 0.5 mg/dL — ABNORMAL LOW (ref 0.61–1.24)
GFR calc Af Amer: >60 mL/min (ref >60)
GFR calc non Af Amer: >60 mL/min (ref >60)
Glucose, Bld: 111 mg/dL — ABNORMAL HIGH (ref 70–99)
Potassium: 3.3 mmol/L — ABNORMAL LOW (ref 3.5–5.1)
Sodium: 138 mmol/L (ref 135–145)
Total Bilirubin: 8.1 mg/dL — ABNORMAL HIGH (ref 0.3–1.2)
Total Protein: 7.5 g/dL (ref 6.5–8.1)

## 2019-02-07 LAB — BODY FLUID CELL COUNT WITH DIFFERENTIAL
Eos, Fluid: 0 %
Lymphs, Fluid: 23 %
Monocyte-Macrophage-Serous Fluid: 68 %
Neutrophil Count, Fluid: 9 %
Other Cells, Fluid: 0 %
Total Nucleated Cell Count, Fluid: 40 cu mm

## 2019-02-07 LAB — ACETAMINOPHEN LEVEL: Acetaminophen (Tylenol), Serum: <10 ug/mL — ABNORMAL LOW (ref 10–30)

## 2019-02-07 LAB — ALBUMIN, PLEURAL OR PERITONEAL FLUID: Albumin, Fluid: <1 g/dL

## 2019-02-07 MED ORDER — POTASSIUM CHLORIDE CRYS ER 20 MEQ PO TBCR
40.0000 meq | EXTENDED_RELEASE_TABLET | Freq: Once | ORAL | Status: AC
  Administered 2019-02-07 (×2): 20 meq via ORAL
  Filled 2019-02-07: qty 2

## 2019-02-07 MED ORDER — ALBUMIN HUMAN 25 % IV SOLN
0.0000 g | Freq: Once | INTRAVENOUS | Status: DC
  Filled 2019-02-07: qty 400

## 2019-02-07 MED ORDER — POTASSIUM CHLORIDE 10 MEQ/100ML IV SOLN
10.0000 meq | INTRAVENOUS | Status: DC
  Administered 2019-02-07: 11:00:00 10 meq via INTRAVENOUS
  Filled 2019-02-07: qty 100

## 2019-02-07 MED ORDER — IOHEXOL 240 MG/ML SOLN: 25.0000 mL | INTRAMUSCULAR | Status: DC

## 2019-02-07 MED ORDER — IOHEXOL 300 MG/ML  SOLN
100.0000 mL | Freq: Once | INTRAMUSCULAR | Status: AC | PRN
  Administered 2019-02-07: 14:00:00 100 mL via INTRAVENOUS

## 2019-02-07 MED ORDER — MORPHINE SULFATE (PF) 2 MG/ML IV SOLN: 2.0000 mg | Freq: Once | INTRAVENOUS | Status: DC

## 2019-02-07 MED ORDER — IOHEXOL 240 MG/ML SOLN
25.0000 mL | INTRAMUSCULAR | Status: AC
  Administered 2019-02-07: 25 mL via ORAL

## 2019-02-07 NOTE — Procedures (Signed)
Pre Procedural Dx: Symptomatic Ascites Post Procedural Dx: Same  Successful US guided paracentesis yielding 3.1 L of serous ascitic fluid. Sample sent to laboratory as requested.  EBL: None  Complications: None immediate  Jay Mayela Bullard, MD Pager #: [phone]   

## 2019-02-07 NOTE — Progress Notes (Signed)
Family Meeting Note  Advance Directive:no  Today a meeting took place with the Patient.    The following clinical team members were present during this meeting:MD  The following were discussed:Patient's diagnosis: Elevated LFTs with underlying hepatocellular carcinoma and history of hepatitis C, Patient's progosis: Unable to determine and Goals for treatment: Full Code  Additional follow-up to be provided: Outpatient palliative care services is recommended.  Chaplain consultation to create advanced directives.  Time spent during discussion:16 minutes  Bettey Costa, MD

## 2019-02-07 NOTE — Progress Notes (Signed)
[facility] [place] NAME: Andrew Gibbs    MR#:  [id number]  DATE OF BIRTH:  Mar 29, 1956  SUBJECTIVE:   Patient sent here from oncology office due to elevated liver test denies nausea, vomiting.  He has abdominal distention but reports that this is usual for him.  REVIEW OF SYSTEMS:    Review of Systems  Constitutional: Negative for fever, chills weight loss HENT: Negative for ear pain, nosebleeds, congestion, facial swelling, rhinorrhea, neck pain, neck stiffness and ear discharge.   Respiratory: Negative for cough, shortness of breath, wheezing  Cardiovascular: Negative for chest pain, palpitations and leg swelling.  Gastrointestinal: Negative for heartburn, abdominal pain, vomiting, diarrhea or consitpation Genitourinary: Negative for dysuria, urgency, frequency, hematuria Musculoskeletal: Negative for back pain or joint pain Neurological: Negative for dizziness, seizures, syncope, focal weakness,  numbness and headaches.  Hematological: Does not bruise/bleed easily.  Psychiatric/Behavioral: Negative for hallucinations, confusion, dysphoric mood    Tolerating Diet: yes      DRUG ALLERGIES:  No Known Allergies  VITALS:  Blood pressure 110/72, pulse 76, temperature 98.7 F (37.1 C), resp. rate 17, weight 59.4 kg, SpO2 96 %.  PHYSICAL EXAMINATION:  Constitutional: Appears thin and frail no distress. HENT: Normocephalic. [name] Oropharynx is clear and moist.  Eyes: Conjunctivae and EOM are normal. PERRLA, no scleral icterus.  Neck: Normal ROM. Neck supple. No JVD. No tracheal deviation. CVS: RRR, S1/S2 +, no murmurs, no gallops, no carotid bruit.  Pulmonary: Effort and breath sounds normal, no stridor, rhonchi, wheezes, rales.  Abdominal: Soft. BS +, slight distension of abdomen without fluid wave no tenderness, rebound or guarding.  Musculoskeletal: Normal range of motion. No edema and no tenderness.  Neuro: Alert. CN 2-12 grossly  intact. No focal deficits. Skin: Skin is warm and dry. No rash noted. Psychiatric: Normal mood and affect.      LABORATORY PANEL:   CBC Recent Labs  Lab 02/07/19 0251  WBC 5.0  HGB 10.1*  HCT 29.7*  PLT 104*   ------------------------------------------------------------------------------------------------------------------  Chemistries  Recent Labs  Lab 02/07/19 0251  NA 138  K 3.3*  CL 105  CO2 24  GLUCOSE 111*  BUN 9  CREATININE 0.50*  CALCIUM 8.1*  AST 1,313*  ALT 197*  ALKPHOS 264*  BILITOT 8.1*   ------------------------------------------------------------------------------------------------------------------  Cardiac Enzymes No results for input(s): TROPONINI in the last 168 hours. ------------------------------------------------------------------------------------------------------------------  RADIOLOGY:  No results found.   ASSESSMENT AND PLAN:   63 year old male with history of hepatitis C with hepatocellular carcinoma who was sent from oncology office due to elevated liver function test.  1.  Elevated LFTs in the setting of underlying hepatocellular carcinoma and hepatitis C: I will order CT of the abdomen and pelvis This could be due to progression of underlying disease Case discussed with GI this morning Oncology evaluation pending LFTs for a.m. Order hepatitis panel and Tylenol level  2.  Hepatocellular carcinoma: Oncology evaluation pending   3.  Hypokalemia: Replete     Management plans discussed with the patient and he is in agreement.  CODE STATUS: Full  TOTAL TIME TAKING CARE OF THIS PATIENT: 30 minutes.     POSSIBLE D/C 2 to 4 days, DEPENDING ON CLINICAL CONDITION.   Bettey Costa M.D on 02/07/2019 at 11:25 AM  Between 7am to 6pm - Pager - [phone] After 6pm go to [email or web address] - password [email or web address] [facility]  Office  [phone]  CC: Primary care physician; Center, [facility]  [facility]  Note: This dictation was prepared with Dragon dictation along with smaller phrase technology. Any transcriptional errors that result from this process are unintentional.

## 2019-02-07 NOTE — Progress Notes (Signed)
   02/07/19 1200  Clinical Encounter Type  Visited With Patient;Health care provider  Visit Type Initial  Referral From Physician   Chaplain received an OR to complete or update an AD. Upon arrival, the patient was resting in bed with the lights off and TV on. He was welcoming and quiet. The patient declined interest in the AD, but is open to conversation later after his pain medication is administered. Chaplain will follow up at a later time.

## 2019-02-07 NOTE — Consult Note (Signed)
[place]  [phone] [phone]  ID: Andrew Gibbs OB: 25-Sep-1955  MR#: [id number]  AOZ#:[id number]  Patient Care Team: Center, [facility] as PCP - General (General Practice) Clent Jacks, RN as Registered Nurse  CHIEF COMPLAINT: Stage IV hepatocellular carcinoma, decompensated liver disease.  INTERVAL HISTORY: Patient is a 63 year old male who was initially evaluated in the cancer center yesterday and found to have decompensated liver disease in the setting of stage IV hepatocellular carcinoma.  Given his decreased performance status and overall health, he has not been able to receive treatment for his malignancy.  Patient feels improved since admission, but still has significant abdominal swelling and back pain.  He has no neurologic complaints.  He denies any recent fevers.  He has a fair appetite.  He denies any chest pain, shortness of breath, cough, or hemoptysis.  He has no nausea, vomiting, constipation, or diarrhea.  He has no urinary complaints.  Patient offers no further specific complaints today.  REVIEW OF SYSTEMS:   Review of Systems  Constitutional: Positive for malaise/fatigue. Negative for fever and weight loss.  Respiratory: Negative for cough and shortness of breath.   Cardiovascular: Negative.  Negative for chest pain and leg swelling.  Gastrointestinal: Positive for abdominal pain. Negative for constipation, diarrhea, nausea and vomiting.  Genitourinary: Negative for flank pain.  Musculoskeletal: Positive for back pain.  Skin: Negative.  Negative for rash.  Neurological: Positive for weakness. Negative for dizziness and focal weakness.  Psychiatric/Behavioral: Negative.  The patient is not nervous/anxious.     As per HPI. Otherwise, a complete review of systems is negative.  PAST MEDICAL HISTORY: Past Medical History:  Diagnosis Date   Blood dyscrasia    thrombocytopenia   Hepatitis C    Heroin  use     PAST SURGICAL HISTORY: Past Surgical History:  Procedure Laterality Date   ESOPHAGOGASTRODUODENOSCOPY N/A 01/06/2019   Procedure: ESOPHAGOGASTRODUODENOSCOPY (EGD);  Surgeon: Lucilla Lame, MD;  Location: [facility] ENDOSCOPY;  Service: Endoscopy;  Laterality: N/A;   ESOPHAGOGASTRODUODENOSCOPY (EGD) WITH PROPOFOL N/A 12/24/2018   Procedure: ESOPHAGOGASTRODUODENOSCOPY (EGD) WITH PROPOFOL;  Surgeon: Jonathon Bellows, MD;  Location: [facility] ENDOSCOPY;  Service: Gastroenterology;  Laterality: N/A;   IR ANGIOGRAM SELECTIVE EACH ADDITIONAL VESSEL  10/17/2018   IR ANGIOGRAM SELECTIVE EACH ADDITIONAL VESSEL  10/17/2018   IR ANGIOGRAM SELECTIVE EACH ADDITIONAL VESSEL  10/17/2018   IR ANGIOGRAM SELECTIVE EACH ADDITIONAL VESSEL  10/17/2018   IR ANGIOGRAM SELECTIVE EACH ADDITIONAL VESSEL  10/17/2018   IR ANGIOGRAM SELECTIVE EACH ADDITIONAL VESSEL  10/17/2018   IR ANGIOGRAM SELECTIVE EACH ADDITIONAL VESSEL  10/17/2018   IR ANGIOGRAM SELECTIVE EACH ADDITIONAL VESSEL  11/05/2018   IR ANGIOGRAM SELECTIVE EACH ADDITIONAL VESSEL  11/05/2018   IR ANGIOGRAM SELECTIVE EACH ADDITIONAL VESSEL  11/05/2018   IR ANGIOGRAM VISCERAL SELECTIVE  10/17/2018   IR ANGIOGRAM VISCERAL SELECTIVE  10/17/2018   IR ANGIOGRAM VISCERAL SELECTIVE  11/05/2018   IR EMBO ARTERIAL NOT HEMORR HEMANG INC GUIDE ROADMAPPING  10/17/2018   IR EMBO TUMOR ORGAN ISCHEMIA INFARCT INC GUIDE ROADMAPPING  11/05/2018   IR RADIOLOGIST EVAL & MGMT  05/22/2018   IR RADIOLOGIST EVAL & MGMT  08/27/2018   IR US GUIDE VASC ACCESS RIGHT  10/17/2018   IR US GUIDE VASC ACCESS RIGHT  11/05/2018   KNEE SURGERY     TEE WITHOUT CARDIOVERSION N/A 04/22/2018   Procedure: TRANSESOPHAGEAL ECHOCARDIOGRAM (TEE);  Surgeon: Teodoro Spray, MD;  Location: [facility] ORS;  Service: Cardiovascular;  Laterality: N/A;  FAMILY HISTORY: Family History  Problem Relation Age of Onset   Multiple myeloma Mother     ADVANCED DIRECTIVES (Y/N):  '@ADVDIR' @  HEALTH MAINTENANCE: Social  History   Tobacco Use   Smoking status: Current Every Day Smoker    Packs/day: 0.50    Years: 40.00    Pack years: 20.00   Smokeless tobacco: Never Used  Substance Use Topics   Alcohol use: Not Currently    Comment: 40 oz today   Drug use: Not Currently    Types: IV, Cocaine, Heroin, Marijuana    Comment: heroin, states he used to use cocaine     Colonoscopy:  PAP:  Bone density:  Lipid panel:  No Known Allergies  Current Facility-Administered Medications  Medication Dose Route Frequency Provider Last Rate Last Dose   albumin human 25 % solution 0-100 g  0-100 g Intravenous Once Croley, Granville M, PA-C       lactulose (CHRONULAC) 10 GM/15ML solution 20 g  20 g Oral BID Lance Coon, MD   20 g at 02/07/19 1119   morphine 2 MG/ML injection 2 mg  2 mg Intravenous Once Lloyd Huger, MD       nadolol (CORGARD) tablet 20 mg  20 mg Oral Daily Lance Coon, MD   20 mg at 02/07/19 1119   ondansetron (ZOFRAN) tablet 4 mg  4 mg Oral Q6H PRN Lance Coon, MD       Or   ondansetron [facility]) injection 4 mg  4 mg Intravenous Q6H PRN Lance Coon, MD       oxyCODONE (Oxy IR/ROXICODONE) immediate release tablet 5-10 mg  5-10 mg Oral Q4H PRN Lance Coon, MD   5 mg at 02/07/19 1343   pantoprazole (PROTONIX) EC tablet 40 mg  40 mg Oral Daily Lance Coon, MD   40 mg at 02/07/19 1119   potassium chloride SA (K-DUR) CR tablet 40 mEq  40 mEq Oral Once Bettey Costa, MD        OBJECTIVE: Vitals:   02/07/19 0002 02/07/19 0539  BP: 122/67 110/72  Pulse: 71 76  Resp: 16 17  Temp: 99 F (37.2 C) 98.7 F (37.1 C)  SpO2: 97% 96%     Body mass index is 20.52 kg/m.    ECOG FS:0 - Asymptomatic  General: Cachectic, no acute distress. Eyes: Pink conjunctiva, anicteric sclera. HEENT: Normocephalic, moist mucous membranes, clear oropharnyx. Lungs: Clear to auscultation bilaterally. Heart: Regular rate and rhythm. No rubs, murmurs, or gallops. Abdomen: Distended,  nontender. Musculoskeletal: No edema, cyanosis, or clubbing. Neuro: Alert, answering all questions appropriately. Cranial nerves grossly intact. Skin: No rashes or petechiae noted. Psych: Normal affect.   LAB RESULTS:  Lab Results  Component Value Date   NA 138 02/07/2019   K 3.3 (L) 02/07/2019   CL 105 02/07/2019   CO2 24 02/07/2019   GLUCOSE 111 (H) 02/07/2019   BUN 9 02/07/2019   CREATININE 0.50 (L) 02/07/2019   CALCIUM 8.1 (L) 02/07/2019   PROT 7.5 02/07/2019   ALBUMIN 1.6 (L) 02/07/2019   AST 1,313 (H) 02/07/2019   ALT 197 (H) 02/07/2019   ALKPHOS 264 (H) 02/07/2019   BILITOT 8.1 (H) 02/07/2019   GFRNONAA >60 02/07/2019   GFRAA >60 02/07/2019    Lab Results  Component Value Date   WBC 5.0 02/07/2019   NEUTROABS 3.9 02/06/2019   HGB 10.1 (L) 02/07/2019   HCT 29.7 (L) 02/07/2019   MCV 95.5 02/07/2019   PLT 104 (L) 02/07/2019  STUDIES: Dg Chest 1 View  Result Date: 01/09/2019 CLINICAL DATA:  Abnormal respirations. Cirrhosis with hepatocellular carcinoma. EXAM: CHEST  1 VIEW COMPARISON:  Chest radiograph 01/03/2019. CT chest 01/03/2019. FINDINGS: Heart size is increased. Tortuous aorta. Sclerotic bone metastases better visualized on CT. Possible RIGHT paravertebral mass at T8. low lung volumes. Mild vascular congestion. No consolidation or edema. IMPRESSION: Low lung volumes. Cardiomegaly with mild vascular congestion. No consolidation or edema. Possible RIGHT T8 paravertebral mass. Electronically Signed   By: Staci Righter M.D.   On: 01/09/2019 08:29   [name] Contrast  Result Date: 01/08/2019 CLINICAL DATA:  Altered mental status EXAM: MRI HEAD WITHOUT CONTRAST TECHNIQUE: Multiplanar, multiecho pulse sequences of the brain and surrounding structures were obtained without intravenous contrast. COMPARISON:  Head CT 10/14/2011 FINDINGS: Examination is markedly motion degraded. BRAIN: There is no acute infarct, acute hemorrhage or extra-axial collection. The  midline structures are normal. Multifocal white matter hyperintensity, most commonly due to chronic ischemic microangiopathy. Mild generalized volume loss. No midline shift or other mass effect. VASCULAR: The major intracranial arterial and venous sinus flow voids are normal. SKULL AND UPPER CERVICAL SPINE: Calvarial bone marrow signal is normal. There is no skull base mass. Visualized upper cervical spine and soft tissues are normal. SINUSES/ORBITS: No fluid levels or advanced mucosal thickening. No mastoid or middle ear effusion. The orbits are normal. IMPRESSION: Mild chronic small vessel disease and brain volume loss without acute intracranial abnormality. Electronically Signed   By: Ulyses Jarred M.D.   On: 01/08/2019 19:58   US Paracentesis  Result Date: 01/22/2019 INDICATION: History of hepatitis C and hepatocellular carcinoma, now with symptomatic intra-abdominal ascites. Request made for ultrasound-guided paracentesis for diagnostic and therapeutic purposes. EXAM: ULTRASOUND-GUIDED PARACENTESIS COMPARISON:  None. MEDICATIONS: None. COMPLICATIONS: None immediate. TECHNIQUE: Informed written consent was obtained from the patient after a discussion of the risks, benefits and alternatives to treatment. A timeout was performed prior to the initiation of the procedure. Initial ultrasound scanning demonstrates a moderate amount of ascites within the right lower abdominal quadrant. The right lower abdomen was prepped and draped in the usual sterile fashion. 1% lidocaine with epinephrine was used for local anesthesia. An ultrasound image was saved for documentation purposed. An 8 Fr Safe-T-Centesis catheter was introduced. The paracentesis was performed. The catheter was removed and a dressing was applied. The patient tolerated the procedure well without immediate post procedural complication. FINDINGS: A total of approximately 1.9 liters of serous fluid was removed. Samples were sent to the laboratory as  requested by the clinical team. IMPRESSION: Successful ultrasound-guided paracentesis yielding 1.9 liters of peritoneal fluid. Electronically Signed   By: Sandi Mariscal M.D.   On: 01/22/2019 15:35    ASSESSMENT: Stage IV hepatocellular carcinoma, decompensated liver disease.  PLAN:    1.  Stage IV hepatocellular carcinoma: Patient with bony metastasis.  He has not received treatment secondary to decreased performance status and overall health.  Hospice was previously discussed and recommended, but patient declined.  Palliative care and hospice is still appropriate, but given family dynamics particularly with patient's brother, this has not been agreed upon.  2.  Decompensated liver disease: Appreciate GI input.  Patient scheduled for therapeutic paracentesis today.  Acute hepatitis panel as well as CT of the abdomen and pelvis are pending. 3.  Ascites: Therapeutic paracentesis as above. 4.  Anemia: Decreased, but chronic and unchanged. 5.  Thrombocytopenia: Mild, monitor.  Appreciate consult, will follow.  Lloyd Huger, MD   02/07/2019 2:07 PM

## 2019-02-07 NOTE — Progress Notes (Signed)
Case discussed with Dr. Grayland Ormond and Dr. Benjie Karvonen. Likely hepatic failure is secondary to [facility] but workup is pending. Dr. Benjie Karvonen has requested we hold on palliative care consult until workup is complete. Will plan to follow up next week.    Signed by: Altha Harm, PhD, NP-C [phone] (Work Cell)

## 2019-02-07 NOTE — Plan of Care (Signed)

## 2019-02-07 NOTE — Consult Note (Signed)
GI Inpatient Consult Note  Reason for Consult: Elevated LFTs, Acute liver failure, Hepatocellular carcinoma with mets   Attending Requesting Consult: Dr. Bettey Costa  History of Present Illness: Andrew Gibbs is a 63 y.o. male seen for evaluation of acute liver failure 2/2 HCC with bone mets at the request of Dr. Bettey Costa. Pt presented to the symptom management clinic yesterday with worsening abdominal distention. Initial diagnosis of [place] was made 05/2018. He has a hx of chronic hepatitis C and reportedly was treated in 1976. He was seen by Dr. Vicente Males 07/2018 where he was diagnosed with decompensated liver cirrhosis with ascites with MELD 13. He follows with Dr. Grayland Ormond for [facility] with a 4cm right liver mass. 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 He was evaluated by [facility] Hepatology and found to not be a transplant for surgery or systemic chemotherapy. He had two EGDs in June 2020 for hematmesis and melena which found no acute GI bleeding and Grade I esophageal varices. He reports over the past few weeks developing worsening abdominal swelling. He had paracentesis three weeks ago which removed 1.9L of serous fluid with cytology negative for SBP or malignancy. Upon presentation to his oncology office yesterday, he was found to be in acute liver failure with AST 1241, ALT 200, alk phos 265, total bilirubin 8.1, albumin 1.7, INR 1.4. He endorses some increased fatigue and decreased appetite over the past few weeks. He reports he usually lives by himself but has been staying with his brother. He denies any abdominal pain, nausea, vomiting, hematemesis, dysphagia, odynophagia, or rectal bleeding. His main complaint this afternoon is back pain. He reports he is waiting on his pain medication. He reports that his oncologist has recommended palliative care, but some of his family members have been hesitant to this idea. He denies any current IVDU. He denies any fever, recent travel, or sick contacts.     Last Colonoscopy: N/A Last Endoscopy: 12/2018   Past Medical History:  Past Medical History:  Diagnosis Date  . Blood dyscrasia    thrombocytopenia  . Hepatitis C   . Heroin use     Problem List: Patient Active Problem List   Diagnosis Date Noted  . Liver failure ([place]) 02/06/2019  . Neoplasm related pain   . Palliative care encounter   . Hematemesis with nausea   . GI bleed 01/03/2019  . Candidemia ([place]) 07/23/2018  . Discitis of cervical region 07/23/2018  . 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 Hepatocellular carcinoma ([place]) 05/17/2018  . Malnutrition of moderate degree 04/28/2018  . Abscess of right arm 04/17/2018    Past Surgical History: Past Surgical History:  Procedure Laterality Date  . ESOPHAGOGASTRODUODENOSCOPY N/A 01/06/2019   Procedure: ESOPHAGOGASTRODUODENOSCOPY (EGD);  Surgeon: Lucilla Lame, MD;  Location: [facility] ENDOSCOPY;  Service: Endoscopy;  Laterality: N/A;  . ESOPHAGOGASTRODUODENOSCOPY (EGD) WITH PROPOFOL N/A 12/24/2018   Procedure: ESOPHAGOGASTRODUODENOSCOPY (EGD) WITH PROPOFOL;  Surgeon: Jonathon Bellows, MD;  Location: [facility] ENDOSCOPY;  Service: Gastroenterology;  Laterality: N/A;  . IR ANGIOGRAM SELECTIVE EACH ADDITIONAL VESSEL  10/17/2018  . IR ANGIOGRAM SELECTIVE EACH ADDITIONAL VESSEL  10/17/2018  . IR ANGIOGRAM SELECTIVE EACH ADDITIONAL VESSEL  10/17/2018  . IR ANGIOGRAM SELECTIVE EACH ADDITIONAL VESSEL  10/17/2018  . IR ANGIOGRAM SELECTIVE EACH ADDITIONAL VESSEL  10/17/2018  . IR ANGIOGRAM SELECTIVE EACH ADDITIONAL VESSEL  10/17/2018  . IR ANGIOGRAM SELECTIVE EACH ADDITIONAL VESSEL  10/17/2018  . IR ANGIOGRAM SELECTIVE EACH ADDITIONAL VESSEL  11/05/2018  . IR ANGIOGRAM  SELECTIVE EACH ADDITIONAL VESSEL  11/05/2018  . IR ANGIOGRAM SELECTIVE EACH ADDITIONAL VESSEL  11/05/2018  . IR ANGIOGRAM VISCERAL SELECTIVE  10/17/2018  . IR ANGIOGRAM VISCERAL SELECTIVE  10/17/2018  . IR ANGIOGRAM VISCERAL SELECTIVE  11/05/2018  . IR EMBO ARTERIAL NOT HEMORR HEMANG INC GUIDE  ROADMAPPING  10/17/2018  . IR EMBO TUMOR ORGAN ISCHEMIA INFARCT INC GUIDE ROADMAPPING  11/05/2018  . IR RADIOLOGIST EVAL & MGMT  05/22/2018  . IR RADIOLOGIST EVAL & MGMT  08/27/2018  . IR US GUIDE VASC ACCESS RIGHT  10/17/2018  . IR US GUIDE VASC ACCESS RIGHT  11/05/2018  . KNEE SURGERY    . TEE WITHOUT CARDIOVERSION N/A 04/22/2018   Procedure: TRANSESOPHAGEAL ECHOCARDIOGRAM (TEE);  Surgeon: Teodoro Spray, MD;  Location: [facility] ORS;  Service: Cardiovascular;  Laterality: N/A;    Allergies: No Known Allergies  Home Medications: Medications Prior to Admission  Medication Sig Dispense Refill Last Dose  . furosemide (LASIX) 40 MG tablet Take 40 mg by mouth daily.   02/06/2019 at 1000  . Multiple Vitamin (MULTIVITAMIN WITH MINERALS) TABS tablet Take 1 tablet by mouth daily. 30 tablet 0 02/06/2019 at 1000  . nadolol (CORGARD) 20 MG tablet Take 20 mg by mouth daily.   02/06/2019 at 1000  . oxyCODONE (OXY IR/ROXICODONE) 5 MG immediate release tablet Take 1-2 tablets (5-10 mg total) by mouth every 4 (four) hours as needed for moderate pain or severe pain. 14 tablet 0 Past Week at prn  . pantoprazole (PROTONIX) 40 MG tablet Take 1 tablet (40 mg total) by mouth daily. 30 tablet 2 02/05/2019 at 1000  . traMADol (ULTRAM) 50 MG tablet Take 50 mg by mouth every 6 (six) hours as needed.   02/06/2019 at 1000  . cyclobenzaprine (FLEXERIL) 10 MG tablet Take 1 tablet (10 mg total) by mouth 3 (three) times daily as needed. (Patient not taking: Reported on 02/06/2019) 30 tablet 0 Completed Course at Unknown time  . lactulose (CHRONULAC) 10 GM/15ML solution Take 30 mLs (20 g total) by mouth 2 (two) times daily. Hold for greater than 2 loose stools daily 236 mL 0 unknown at prn   Home medication reconciliation was completed with the patient.   Scheduled Inpatient Medications:   . lactulose  20 g Oral BID  .  morphine injection  2 mg Intravenous Once  . nadolol  20 mg Oral Daily  . pantoprazole  40 mg Oral Daily  .  potassium chloride  40 mEq Oral Once    Continuous Inpatient Infusions:    PRN Inpatient Medications:  ondansetron **OR** ondansetron (ZOFRAN) IV, oxyCODONE  Family History: family history includes Multiple myeloma in his mother.  The patient's family history is negative for inflammatory bowel disorders, GI malignancy, or solid organ transplantation.  Social History:   reports that he has been smoking. He has a 20.00 pack-year smoking history. He has never used smokeless tobacco. He reports previous alcohol use. He reports previous drug use. Drugs: IV, Cocaine, Heroin, and Marijuana. The patient denies ETOH, tobacco, or drug use.   Review of Systems: Constitutional: Weight is stable.  Eyes: No changes in vision. ENT: No oral lesions, sore throat.  GI: see HPI.  Heme/Lymph: No easy bruising.  CV: No chest pain.  GU: No hematuria.  Integumentary: No rashes.  Neuro: No headaches.  Psych: No depression/anxiety.  Endocrine: No heat/cold intolerance.  Allergic/Immunologic: No urticaria.  Resp: No cough, SOB.  Musculoskeletal: No joint swelling.    Physical Examination: BP 110/72 (  BP Location: Right Arm)   Pulse 76   Temp 98.7 F (37.1 C)   Resp 17   Wt 59.4 kg   SpO2 96%   BMI 20.52 kg/m  Gen: Frail male, no acute distress HEENT: PEERLA, EOMI, anicteric  Neck: supple, no JVD or thyromegaly Chest: CTA bilaterally, no wheezes, crackles, or other adventitious sounds CV: RRR, no m/g/c/r Abd: moderately distended, nontender to palpation, +BS in all four quadrants; no HSM, guarding, ridigity, or rebound tenderness, abdominal ascites present on exam with positive fluid wave Ext: no edema, well perfused with 2+ pulses, Skin: no rash or lesions noted Lymph: no LAD  Data: Lab Results  Component Value Date   WBC 5.0 02/07/2019   HGB 10.1 (L) 02/07/2019   HCT 29.7 (L) 02/07/2019   MCV 95.5 02/07/2019   PLT 104 (L) 02/07/2019   Recent Labs  Lab 02/06/19 1213 02/07/19 0251   HGB 9.3* 10.1*   Lab Results  Component Value Date   NA 138 02/07/2019   K 3.3 (L) 02/07/2019   CL 105 02/07/2019   CO2 24 02/07/2019   BUN 9 02/07/2019   CREATININE 0.50 (L) 02/07/2019   Lab Results  Component Value Date   ALT 197 (H) 02/07/2019   AST 1,313 (H) 02/07/2019   ALKPHOS 264 (H) 02/07/2019   BILITOT 8.1 (H) 02/07/2019   Recent Labs  Lab 02/06/19 2058  INR 1.4*   Assessment/Plan:  63 y/o AA male admitted for worsening abdominal ascites  1. [place] with bone mets  2. Cirrhosis with ascites 2/2 chronic HCV/EtOH 3. Grade I esophageal varices 4. Acute liver failure  - Admitted for worsening abdominal ascites and decompensated liver disease with HCC with bone mets - MELD 18, Child's Class C (11 points) - We will perform therapeutic paracentesis  - Acute hepatitis panel pending. INR 1.4. - Worsening LFTs compatible with progressing liver disease and acute on chronic liver failure - CT abd/pelvis with contrast this afternoon - Poor prognosis, recommend palliative care for discussion on goals of care and comfort    Thank you for the consult. Please call with questions or concerns.  Reeves Forth [facility] Gastroenterology [phone] (Cell)

## 2019-02-08 LAB — COMPREHENSIVE METABOLIC PANEL
ALT: 176 U/L — ABNORMAL HIGH (ref 0–44)
AST: 1238 U/L — ABNORMAL HIGH (ref 15–41)
Albumin: 1.5 g/dL — ABNORMAL LOW (ref 3.5–5.0)
Alkaline Phosphatase: 255 U/L — ABNORMAL HIGH (ref 38–126)
Anion gap: 7 (ref 5–15)
BUN: 10 mg/dL (ref 8–23)
CO2: 23 mmol/L (ref 22–32)
Calcium: 8 mg/dL — ABNORMAL LOW (ref 8.9–10.3)
Chloride: 106 mmol/L (ref 98–111)
Creatinine, Ser: 0.68 mg/dL (ref 0.61–1.24)
GFR calc Af Amer: >60 mL/min (ref >60)
GFR calc non Af Amer: >60 mL/min (ref >60)
Glucose, Bld: 84 mg/dL (ref 70–99)
Potassium: 4 mmol/L (ref 3.5–5.1)
Sodium: 136 mmol/L (ref 135–145)
Total Bilirubin: 7.7 mg/dL — ABNORMAL HIGH (ref 0.3–1.2)
Total Protein: 7.2 g/dL (ref 6.5–8.1)

## 2019-02-08 LAB — PROTIME-INR
INR: 1.4 — ABNORMAL HIGH (ref 0.8–1.2)
Prothrombin Time: 17.4 seconds — ABNORMAL HIGH (ref 11.4–15.2)

## 2019-02-08 LAB — HEPATITIS PANEL, ACUTE
HCV Ab: >11 s/co ratio — ABNORMAL HIGH (ref 0.0–0.9)
Hep A IgM: NEGATIVE
Hep B C IgM: NEGATIVE
Hepatitis B Surface Ag: NEGATIVE

## 2019-02-08 MED ORDER — FUROSEMIDE 10 MG/ML IJ SOLN
40.0000 mg | Freq: Every day | INTRAMUSCULAR | Status: DC
  Administered 2019-02-09 – 2019-02-10 (×2): 40 mg via INTRAVENOUS
  Filled 2019-02-08 (×3): qty 4

## 2019-02-08 MED ORDER — SPIRONOLACTONE 25 MG PO TABS
50.0000 mg | ORAL_TABLET | Freq: Every day | ORAL | Status: DC
  Administered 2019-02-08 – 2019-02-10 (×3): 50 mg via ORAL
  Filled 2019-02-08 (×3): qty 2

## 2019-02-08 NOTE — Progress Notes (Signed)
[facility] Gastroenterology Inpatient Progress Note  Subjective: Patient seen for follow-up elevated liver associated enzymes, history hepatitis C, hepatic decompensation with ascites.  Patient better today after therapeutic paracentesis of 3.1 L yesterday.  IV Lasix ordered had to be held due to relative hypotension but will be resumed tomorrow.  Patient did obtain oral spironolactone.  Objective: Vital signs in last 24 hours: Temp:  [98.2 F (36.8 C)-99.5 F (37.5 C)] 98.8 F (37.1 C) (07/25 1059) Pulse Rate:  [63-83] 83 (07/25 1059) Resp:  [16-19] 19 (07/25 1059) BP: (100-116)/(57-73) 102/73 (07/25 1059) SpO2:  [95 %-100 %] 99 % (07/25 1059) Blood pressure 102/73, pulse 83, temperature 98.8 F (37.1 C), temperature source Oral, resp. rate 19, weight 59.4 kg, SpO2 99 %.    Intake/Output from previous day: No intake/output data recorded.  Intake/Output this shift: No intake/output data recorded.   General appearance: Alert, no acute distress Resp: Clear to auscultation Cardio: Regular rate, no gallop noted GI: Decreased distention, soft with bowel sounds present.  No rebound. Extremities: Trace edema laterally.   Lab Results: Results for orders placed or performed during the hospital encounter of 02/06/19 (from the past 24 hour(s))  Body fluid cell count with differential     Status: Abnormal   Collection Time: 02/07/19  2:32 PM  Result Value Ref Range   Fluid Type-FCT CYTOPERI    Color, Fluid YELLOW YELLOW   Appearance, Fluid CLEAR (A) CLEAR   WBC, Fluid 40 cu mm   Neutrophil Count, Fluid 9 %   Lymphs, Fluid 23 %   Monocyte-Macrophage-Serous Fluid 68 %   Eos, Fluid 0 %   Other Cells, Fluid 0 %  Body fluid culture     Status: None (Preliminary result)   Collection Time: 02/07/19  2:32 PM   Specimen: [facility] Cytology Peritoneal fluid  Result Value Ref Range   Specimen Description      PERITONEAL Performed at [facility], [address]  [place], [place] [address]    Special Requests      NONE Performed at [facility], [address] [place], [place] [address]    Gram Stain      RARE WBC PRESENT, PREDOMINANTLY MONONUCLEAR NO ORGANISMS SEEN    Culture      NO GROWTH < 24 HOURS Performed at [facility], [place] [address] [place], [place] [address]    Report Status PENDING   Albumin, pleural or peritoneal fluid     Status: None   Collection Time: 02/07/19  2:32 PM  Result Value Ref Range   Albumin, Fluid <1.0 g/dL   Fluid Type-FALB CYTOPERI   Comprehensive metabolic panel     Status: Abnormal   Collection Time: 02/08/19  3:57 AM  Result Value Ref Range   Sodium 136 135 - 145 mmol/L   Potassium 4.0 3.5 - 5.1 mmol/L   Chloride 106 98 - 111 mmol/L   CO2 23 22 - 32 mmol/L   Glucose, Bld 84 70 - 99 mg/dL   BUN 10 8 - 23 mg/dL   Creatinine, Ser 0.68 0.61 - 1.24 mg/dL   Calcium 8.0 (L) 8.9 - 10.3 mg/dL   Total Protein 7.2 6.5 - 8.1 g/dL   Albumin 1.5 (L) 3.5 - 5.0 g/dL   AST 1,238 (H) 15 - 41 U/L   ALT 176 (H) 0 - 44 U/L   Alkaline Phosphatase 255 (H) 38 - 126 U/L   Total Bilirubin 7.7 (H) 0.3 - 1.2 mg/dL  GFR calc non Af Amer >60 >60 mL/min   GFR calc Af Amer >60 >60 mL/min   Anion gap 7 5 - 15  Protime-INR     Status: Abnormal   Collection Time: 02/08/19  3:57 AM  Result Value Ref Range   Prothrombin Time 17.4 (H) 11.4 - 15.2 seconds   INR 1.4 (H) 0.8 - 1.2     Recent Labs    02/06/19 1213 02/07/19 0251  WBC 4.8 5.0  HGB 9.3* 10.1*  HCT 28.0* 29.7*  PLT 94* 104*   BMET Recent Labs    02/06/19 1213 02/07/19 0251 02/08/19 0357  NA 133* 138 136  K 3.6 3.3* 4.0  CL 103 105 106  CO2 23 24 23   GLUCOSE 161* 111* 84  BUN 11 9 10   CREATININE 0.70 0.50* 0.68  CALCIUM 8.1* 8.1* 8.0*   LFT Recent Labs    02/08/19 0357  PROT 7.2  ALBUMIN 1.5*  AST 1,238*  ALT 176*  ALKPHOS 255*  BILITOT 7.7*   PT/INR Recent Labs    02/06/19 2058 02/08/19 0357  LABPROT 16.8* 17.4*  INR  1.4* 1.4*   Hepatitis Panel Recent Labs    02/07/19 1145  HEPBSAG Negative  HCVAB >11.0*  HEPAIGM Negative  HEPBIGM Negative   C-Diff No results for input(s): CDIFFTOX in the last 72 hours. No results for input(s): CDIFFPCR in the last 72 hours.   Studies/Results: Ct Abdomen Pelvis W Contrast  Result Date: 02/07/2019 CLINICAL DATA:  Stage IV hepatocellular carcinoma. EXAM: CT ABDOMEN AND PELVIS WITH CONTRAST TECHNIQUE: Multidetector CT imaging of the abdomen and pelvis was performed using the standard protocol following bolus administration of intravenous contrast. CONTRAST:  135mL OMNIPAQUE IOHEXOL 300 MG/ML  SOLN COMPARISON:  CTA chest abdomen pelvis 01/03/2019 FINDINGS: Lower chest: Small right pleural effusion noted with right base collapse/consolidation. Hepatobiliary: Segment IV lesion is similar to prior measuring 4.7 x 4.1 cm today compared to 4.7 x 4.0 cm when I remeasure in a similar fashion on the prior study. 3.5 x 3.2 cm lesion inferior right liver (38/2) was 2.8 x 2.3 cm when I remeasure it in a similar fashion on the prior study. 2.8 x 2.6 cm lesion in the lateral segment left liver (15/7) is similar to prior. 4.0 x 3.2 cm lesion in the posterior right liver (15/7) not definitely seen on prior study. Heterogeneous enhancement in the right liver towards the dome is similar to prior with diffuse nodularity throughout the liver. Calcified gallstones evident. No intrahepatic or extrahepatic biliary dilation. Pancreas: No focal mass lesion. No dilatation of the main duct. No intraparenchymal cyst. No peripancreatic edema. Spleen: Splenic size upper normal to borderline increased. Adrenals/Urinary Tract: No adrenal nodule or mass. Stable bilateral renal cysts. No hydroureteronephrosis. The urinary bladder appears normal for the degree of distention. Stomach/Bowel: Stomach is unremarkable. No gastric wall thickening. No evidence of outlet obstruction. Duodenum is normally positioned as is  the ligament of Treitz. No small bowel obstruction. Colon is not well distended but there does appear to be some edema and wall thickening in the ascending colon. Wall thickening not excluded in the transverse colon. Vascular/Lymphatic: There is abdominal aortic atherosclerosis without aneurysm. Portal vein and superior mesenteric vein are patent. Embolization coils noted right abdomen. There is no gastrohepatic or hepatoduodenal ligament lymphadenopathy. No intraperitoneal or retroperitoneal lymphadenopathy. No pelvic sidewall lymphadenopathy. Reproductive: The prostate gland and seminal vesicles are unremarkable. Other: Large volume ascites. Nodular thickening of the peritoneum noted in the region of  both para colic gutters (see right gutter image 49/series 2). Musculoskeletal: Small bilateral groin hernias contain fat and fluid. Lytic bone metastases are seen in the sacrum, lumbar spine, and thoracic spine. T8 metastatic lesion involves the right neural foramen and right epidural space, as characterized on recent MRI. Lesion at the right L1 pedicle demonstrates extradural tumor extension. Posterior right L3 lesion also demonstrates evidence of epidural tumor spread. IMPRESSION: 1. Multiple liver lesions on a background of cirrhosis. Imaging features compatible with multifocal HCC. 4 cm lesion in the posterior aspect of the right liver appears new in the interval. 2. Interval development of large volume ascites. 3. Multiple large bony metastases in the thoracolumbar spine and bony pelvis. Lesions at T8, L1, and L3 demonstrate epidural spread of tumor. 4. Concern for circumferential wall thickening in the right colon. Infectious/inflammatory colitis would be a consideration. 5. Nodular thickening of the peritoneum in both para colic gutters suspicious for intraperitoneal spread of disease. 6. Small right pleural effusion with right base collapse/consolidation. 7.  Aortic Atherosclerois (ICD10-170.0) Electronically  Signed   By: Misty Stanley M.D.   On: 02/07/2019 15:34   US Paracentesis  Result Date: 02/07/2019 INDICATION: History of cirrhosis and multifocal hepatocellular carcinoma with recurrent symptomatic ascites. Please perform ultrasound-guided paracentesis for diagnostic and therapeutic purposes. EXAM: ULTRASOUND-GUIDED PARACENTESIS COMPARISON:  Ultrasound-guided paracentesis performed 01/22/2019 yielding 1.9 L of peritoneal fluid; CT abdomen and pelvis-02/07/2019 MEDICATIONS: None. COMPLICATIONS: None immediate. TECHNIQUE: Informed written consent was obtained from the patient after a discussion of the risks, benefits and alternatives to treatment. A timeout was performed prior to the initiation of the procedure. Initial ultrasound scanning demonstrates a large amount of ascites within the right lower abdominal quadrant. The right lower abdomen was prepped and draped in the usual sterile fashion. 1% lidocaine with epinephrine was used for local anesthesia. An ultrasound image was saved for documentation purposed. An 8 Fr Safe-T-Centesis catheter was introduced. The paracentesis was performed. The catheter was removed and a dressing was applied. The patient tolerated the procedure well without immediate post procedural complication. FINDINGS: A total of approximately 3.1 liters of serous fluid was removed. Samples were sent to the laboratory as requested by the clinical team. IMPRESSION: Successful ultrasound-guided paracentesis yielding 3.1 liters of peritoneal fluid. Electronically Signed   By: Sandi Mariscal M.D.   On: 02/07/2019 17:14    Scheduled Inpatient Medications:   . furosemide  40 mg Intravenous Daily  . lactulose  20 g Oral BID  .  morphine injection  2 mg Intravenous Once  . nadolol  20 mg Oral Daily  . pantoprazole  40 mg Oral Daily  . spironolactone  50 mg Oral Daily    Continuous Inpatient Infusions:   . albumin human      PRN Inpatient Medications:  ondansetron **OR** ondansetron  (ZOFRAN) IV, oxyCODONE    Assessment:   1.  Hepatocellular carcinoma, stage IV-per oncology.  Palliative care consult pending. 2.  Abdominal ascites-status post therapeutic paracentesis as well as diagnostic.  Review of fluid studies suggest ascites is secondary to portal hypertension and not malignancy.  Diuretic therapy has been initiated.  Plan:  1.  I will place the patient on a 2 g sodium diet. 2.  Continue oral spironolactone.  Resume Lasix tomorrow if blood pressure allows. 3.  We will continue to follow.  Agam Davenport K. Alice Reichert, M.D. 02/08/2019, 12:36 PM

## 2019-02-08 NOTE — Progress Notes (Signed)
[facility] NAME: Andrew Gibbs    MR#:  [id number]  DATE OF BIRTH:  28-Jul-1955  SUBJECTIVE:   Patient has better abdominal pain.  No nausea vomiting or diarrhea. REVIEW OF SYSTEMS:    Review of Systems  Constitutional: Negative for fever, chills weight loss HENT: Negative for ear pain, nosebleeds, congestion, facial swelling, rhinorrhea, neck pain, neck stiffness and ear discharge.   Respiratory: Negative for cough, shortness of breath, wheezing  Cardiovascular: Negative for chest pain, palpitations and leg swelling.  Gastrointestinal: Negative for heartburn, abdominal pain, vomiting, diarrhea or consitpation Genitourinary: Negative for dysuria, urgency, frequency, hematuria Musculoskeletal: Negative for back pain or joint pain Neurological: Negative for dizziness, seizures, syncope, focal weakness,  numbness and headaches.  Hematological: Does not bruise/bleed easily.  Psychiatric/Behavioral: Negative for hallucinations, confusion, dysphoric mood DRUG ALLERGIES:  No Known Allergies  VITALS:  Blood pressure 102/73, pulse 83, temperature 98.8 F (37.1 C), temperature source Oral, resp. rate 19, weight 59.4 kg, SpO2 99 %.  PHYSICAL EXAMINATION:  Constitutional: Appears thin and frail no distress. HENT: Normocephalic. Eyes: Conjunctivae and EOM are normal. PERRLA, has scleral icterus.  Neck: Normal ROM. Neck supple. No JVD. No tracheal deviation. CVS: RRR, S1/S2 +, no murmurs, no gallops, no carotid bruit.  Pulmonary: Effort and breath sounds normal, no stridor, rhonchi, wheezes, rales.  Abdominal: Soft. BS +, slight distension of abdomen without fluid wave no tenderness, rebound or guarding.  Musculoskeletal: Normal range of motion. No edema and no tenderness.  Neuro: Alert. CN 2-12 grossly intact. No focal deficits. Skin: Skin is warm and dry. No rash noted. Psychiatric: Normal mood and affect.  LABORATORY PANEL:    CBC Recent Labs  Lab 02/07/19 0251  WBC 5.0  HGB 10.1*  HCT 29.7*  PLT 104*   ------------------------------------------------------------------------------------------------------------------  Chemistries  Recent Labs  Lab 02/08/19 0357  NA 136  K 4.0  CL 106  CO2 23  GLUCOSE 84  BUN 10  CREATININE 0.68  CALCIUM 8.0*  AST 1,238*  ALT 176*  ALKPHOS 255*  BILITOT 7.7*   ------------------------------------------------------------------------------------------------------------------  Cardiac Enzymes No results for input(s): TROPONINI in the last 168 hours. ------------------------------------------------------------------------------------------------------------------  RADIOLOGY:  Ct Abdomen Pelvis W Contrast  Result Date: 02/07/2019 CLINICAL DATA:  Stage IV hepatocellular carcinoma. EXAM: CT ABDOMEN AND PELVIS WITH CONTRAST TECHNIQUE: Multidetector CT imaging of the abdomen and pelvis was performed using the standard protocol following bolus administration of intravenous contrast. CONTRAST:  170mL OMNIPAQUE IOHEXOL 300 MG/ML  SOLN COMPARISON:  CTA chest abdomen pelvis 01/03/2019 FINDINGS: Lower chest: Small right pleural effusion noted with right base collapse/consolidation. Hepatobiliary: Segment IV lesion is similar to prior measuring 4.7 x 4.1 cm today compared to 4.7 x 4.0 cm when I remeasure in a similar fashion on the prior study. 3.5 x 3.2 cm lesion inferior right liver (38/2) was 2.8 x 2.3 cm when I remeasure it in a similar fashion on the prior study. 2.8 x 2.6 cm lesion in the lateral segment left liver (15/7) is similar to prior. 4.0 x 3.2 cm lesion in the posterior right liver (15/7) not definitely seen on prior study. Heterogeneous enhancement in the right liver towards the dome is similar to prior with diffuse nodularity throughout the liver. Calcified gallstones evident. No intrahepatic or extrahepatic biliary dilation. Pancreas: No focal mass lesion. No  dilatation of the main duct. No intraparenchymal cyst. No peripancreatic edema. Spleen: Splenic size upper normal to borderline increased. Adrenals/Urinary Tract: No adrenal  nodule or mass. Stable bilateral renal cysts. No hydroureteronephrosis. The urinary bladder appears normal for the degree of distention. Stomach/Bowel: Stomach is unremarkable. No gastric wall thickening. No evidence of outlet obstruction. Duodenum is normally positioned as is the ligament of Treitz. No small bowel obstruction. Colon is not well distended but there does appear to be some edema and wall thickening in the ascending colon. Wall thickening not excluded in the transverse colon. Vascular/Lymphatic: There is abdominal aortic atherosclerosis without aneurysm. Portal vein and superior mesenteric vein are patent. Embolization coils noted right abdomen. There is no gastrohepatic or hepatoduodenal ligament lymphadenopathy. No intraperitoneal or retroperitoneal lymphadenopathy. No pelvic sidewall lymphadenopathy. Reproductive: The prostate gland and seminal vesicles are unremarkable. Other: Large volume ascites. Nodular thickening of the peritoneum noted in the region of both para colic gutters (see right gutter image 49/series 2). Musculoskeletal: Small bilateral groin hernias contain fat and fluid. Lytic bone metastases are seen in the sacrum, lumbar spine, and thoracic spine. T8 metastatic lesion involves the right neural foramen and right epidural space, as characterized on recent MRI. Lesion at the right L1 pedicle demonstrates extradural tumor extension. Posterior right L3 lesion also demonstrates evidence of epidural tumor spread. IMPRESSION: 1. Multiple liver lesions on a background of cirrhosis. Imaging features compatible with multifocal HCC. 4 cm lesion in the posterior aspect of the right liver appears new in the interval. 2. Interval development of large volume ascites. 3. Multiple large bony metastases in the thoracolumbar  spine and bony pelvis. Lesions at T8, L1, and L3 demonstrate epidural spread of tumor. 4. Concern for circumferential wall thickening in the right colon. Infectious/inflammatory colitis would be a consideration. 5. Nodular thickening of the peritoneum in both para colic gutters suspicious for intraperitoneal spread of disease. 6. Small right pleural effusion with right base collapse/consolidation. 7.  Aortic Atherosclerois (ICD10-170.0) Electronically Signed   By: Misty Stanley M.D.   On: 02/07/2019 15:34   US Paracentesis  Result Date: 02/07/2019 INDICATION: History of cirrhosis and multifocal hepatocellular carcinoma with recurrent symptomatic ascites. Please perform ultrasound-guided paracentesis for diagnostic and therapeutic purposes. EXAM: ULTRASOUND-GUIDED PARACENTESIS COMPARISON:  Ultrasound-guided paracentesis performed 01/22/2019 yielding 1.9 L of peritoneal fluid; CT abdomen and pelvis-02/07/2019 MEDICATIONS: None. COMPLICATIONS: None immediate. TECHNIQUE: Informed written consent was obtained from the patient after a discussion of the risks, benefits and alternatives to treatment. A timeout was performed prior to the initiation of the procedure. Initial ultrasound scanning demonstrates a large amount of ascites within the right lower abdominal quadrant. The right lower abdomen was prepped and draped in the usual sterile fashion. 1% lidocaine with epinephrine was used for local anesthesia. An ultrasound image was saved for documentation purposed. An 8 Fr Safe-T-Centesis catheter was introduced. The paracentesis was performed. The catheter was removed and a dressing was applied. The patient tolerated the procedure well without immediate post procedural complication. FINDINGS: A total of approximately 3.1 liters of serous fluid was removed. Samples were sent to the laboratory as requested by the clinical team. IMPRESSION: Successful ultrasound-guided paracentesis yielding 3.1 liters of peritoneal fluid.  Electronically Signed   By: Sandi Mariscal M.D.   On: 02/07/2019 17:14     ASSESSMENT AND PLAN:   63 year old male with history of hepatitis C with hepatocellular carcinoma who was sent from oncology office due to elevated liver function test.  1.  Elevated LFTs in the setting of underlying hepatocellular carcinoma and hepatitis C: CT of the abdomen and pelvis: Multiple liver lesions on a background of cirrhosis.  Multiple  bone metastasis and possible peritoneal metastasis. This could be due to progression of [place]. Dr. Alice Reichert, continue oral spironolactone.  Resume Lasix tomorrow if blood pressure allows. Very poor prognosis.  Palliative care consult for possible hospice.  2.  Hepatocellular carcinoma: Follow-up with Dr. Grayland Ormond. Ascites due to above.  S/p paracentesis with 3.1 L fluid withdrawal. 3.  Hypokalemia: Improved with potassium supplement. I discussed with the patient's sisters and brother. Management plans discussed with the patient and he is in agreement.  CODE STATUS: Full  TOTAL TIME TAKING CARE OF THIS PATIENT: 36 minutes.  POSSIBLE D/C 2 days, DEPENDING ON CLINICAL CONDITION.   Demetrios Loll M.D on 02/08/2019 at 1:19 PM  Between 7am to 6pm - Pager - [phone] After 6pm go to [email or web address] - password [email or web address] [facility]  Office  [phone]  CC: Primary care physician; Center, [facility]  Note: This dictation was prepared with Dragon dictation along with smaller phrase technology. Any transcriptional errors that result from this process are unintentional.

## 2019-02-08 NOTE — Plan of Care (Signed)
  Problem: Education: Goal: Knowledge of General Education information will improve Description: Including pain rating scale, medication(s)/side effects and non-pharmacologic comfort measures Outcome: Progressing   Problem: Health Behavior/Discharge Planning: Goal: Ability to manage health-related needs will improve Outcome: Progressing   Problem: Clinical Measurements: Goal: Will remain free from infection Outcome: Progressing Goal: Respiratory complications will improve Outcome: Progressing Goal: Cardiovascular complication will be avoided Outcome: Progressing   Problem: Activity: Goal: Risk for activity intolerance will decrease Outcome: Progressing   Problem: Nutrition: Goal: Adequate nutrition will be maintained Outcome: Progressing   Problem: Coping: Goal: Level of anxiety will decrease Outcome: Progressing   Problem: Elimination: Goal: Will not experience complications related to bowel motility Outcome: Progressing Goal: Will not experience complications related to urinary retention Outcome: Progressing   Problem: Safety: Goal: Ability to remain free from injury will improve Outcome: Progressing   Problem: Skin Integrity: Goal: Risk for impaired skin integrity will decrease Outcome: Progressing

## 2019-02-09 NOTE — Progress Notes (Signed)
[facility] Inpatient Progress Note  Subjective: Patient seen for f/u ascites, abdominal pain, elevated liver enzymes. Patient feels much better.  Objective: Vital signs in last 24 hours: Temp:  [98.2 F (36.8 C)-98.7 F (37.1 C)] 98.5 F (36.9 C) (07/26 1032) Pulse Rate:  [76-89] 89 (07/26 1032) Resp:  [17-19] 17 (07/26 1032) BP: (101-115)/(65-73) 101/68 (07/26 1032) SpO2:  [96 %-100 %] 96 % (07/26 1032) Blood pressure 101/68, pulse 89, temperature 98.5 F (36.9 C), temperature source Oral, resp. rate 17, weight 59.4 kg, SpO2 96 %.    Intake/Output from previous day: No intake/output data recorded.  Intake/Output this shift: No intake/output data recorded.   General appearance:  Cachectic, NAD Resp:  CTA Cardio:  RRR GI: distended, though soft, nt. BS+ Extremities:  Trace edema.    Lab Results: No results found for this or any previous visit (from the past 24 hour(s)).   Recent Labs    02/07/19 0251  WBC 5.0  HGB 10.1*  HCT 29.7*  PLT 104*   BMET Recent Labs    02/07/19 0251 02/08/19 0357  NA 138 136  K 3.3* 4.0  CL 105 106  CO2 24 23  GLUCOSE 111* 84  BUN 9 10  CREATININE 0.50* 0.68  CALCIUM 8.1* 8.0*   LFT Recent Labs    02/08/19 0357  PROT 7.2  ALBUMIN 1.5*  AST 1,238*  ALT 176*  ALKPHOS 255*  BILITOT 7.7*   PT/INR Recent Labs    02/06/19 2058 02/08/19 0357  LABPROT 16.8* 17.4*  INR 1.4* 1.4*   Hepatitis Panel Recent Labs    02/07/19 1145  HEPBSAG Negative  HCVAB >11.0*  HEPAIGM Negative  HEPBIGM Negative   C-Diff No results for input(s): CDIFFTOX in the last 72 hours. No results for input(s): CDIFFPCR in the last 72 hours.   Studies/Results: Ct Abdomen Pelvis W Contrast  Result Date: 02/07/2019 CLINICAL DATA:  Stage IV hepatocellular carcinoma. EXAM: CT ABDOMEN AND PELVIS WITH CONTRAST TECHNIQUE: Multidetector CT imaging of the abdomen and pelvis was performed using the standard protocol following  bolus administration of intravenous contrast. CONTRAST:  133mL OMNIPAQUE IOHEXOL 300 MG/ML  SOLN COMPARISON:  CTA chest abdomen pelvis 01/03/2019 FINDINGS: Lower chest: Small right pleural effusion noted with right base collapse/consolidation. Hepatobiliary: Segment IV lesion is similar to prior measuring 4.7 x 4.1 cm today compared to 4.7 x 4.0 cm when I remeasure in a similar fashion on the prior study. 3.5 x 3.2 cm lesion inferior right liver (38/2) was 2.8 x 2.3 cm when I remeasure it in a similar fashion on the prior study. 2.8 x 2.6 cm lesion in the lateral segment left liver (15/7) is similar to prior. 4.0 x 3.2 cm lesion in the posterior right liver (15/7) not definitely seen on prior study. Heterogeneous enhancement in the right liver towards the dome is similar to prior with diffuse nodularity throughout the liver. Calcified gallstones evident. No intrahepatic or extrahepatic biliary dilation. Pancreas: No focal mass lesion. No dilatation of the main duct. No intraparenchymal cyst. No peripancreatic edema. Spleen: Splenic size upper normal to borderline increased. Adrenals/Urinary Tract: No adrenal nodule or mass. Stable bilateral renal cysts. No hydroureteronephrosis. The urinary bladder appears normal for the degree of distention. Stomach/Bowel: Stomach is unremarkable. No gastric wall thickening. No evidence of outlet obstruction. Duodenum is normally positioned as is the ligament of Treitz. No small bowel obstruction. Colon is not well distended but there does appear to be some edema and wall thickening in  the ascending colon. Wall thickening not excluded in the transverse colon. Vascular/Lymphatic: There is abdominal aortic atherosclerosis without aneurysm. Portal vein and superior mesenteric vein are patent. Embolization coils noted right abdomen. There is no gastrohepatic or hepatoduodenal ligament lymphadenopathy. No intraperitoneal or retroperitoneal lymphadenopathy. No pelvic sidewall  lymphadenopathy. Reproductive: The prostate gland and seminal vesicles are unremarkable. Other: Large volume ascites. Nodular thickening of the peritoneum noted in the region of both para colic gutters (see right gutter image 49/series 2). Musculoskeletal: Small bilateral groin hernias contain fat and fluid. Lytic bone metastases are seen in the sacrum, lumbar spine, and thoracic spine. T8 metastatic lesion involves the right neural foramen and right epidural space, as characterized on recent MRI. Lesion at the right L1 pedicle demonstrates extradural tumor extension. Posterior right L3 lesion also demonstrates evidence of epidural tumor spread. IMPRESSION: 1. Multiple liver lesions on a background of cirrhosis. Imaging features compatible with multifocal HCC. 4 cm lesion in the posterior aspect of the right liver appears new in the interval. 2. Interval development of large volume ascites. 3. Multiple large bony metastases in the thoracolumbar spine and bony pelvis. Lesions at T8, L1, and L3 demonstrate epidural spread of tumor. 4. Concern for circumferential wall thickening in the right colon. Infectious/inflammatory colitis would be a consideration. 5. Nodular thickening of the peritoneum in both para colic gutters suspicious for intraperitoneal spread of disease. 6. Small right pleural effusion with right base collapse/consolidation. 7.  Aortic Atherosclerois (ICD10-170.0) Electronically Signed   By: Misty Stanley M.D.   On: 02/07/2019 15:34   US Paracentesis  Result Date: 02/07/2019 INDICATION: History of cirrhosis and multifocal hepatocellular carcinoma with recurrent symptomatic ascites. Please perform ultrasound-guided paracentesis for diagnostic and therapeutic purposes. EXAM: ULTRASOUND-GUIDED PARACENTESIS COMPARISON:  Ultrasound-guided paracentesis performed 01/22/2019 yielding 1.9 L of peritoneal fluid; CT abdomen and pelvis-02/07/2019 MEDICATIONS: None. COMPLICATIONS: None immediate. TECHNIQUE:  Informed written consent was obtained from the patient after a discussion of the risks, benefits and alternatives to treatment. A timeout was performed prior to the initiation of the procedure. Initial ultrasound scanning demonstrates a large amount of ascites within the right lower abdominal quadrant. The right lower abdomen was prepped and draped in the usual sterile fashion. 1% lidocaine with epinephrine was used for local anesthesia. An ultrasound image was saved for documentation purposed. An 8 Fr Safe-T-Centesis catheter was introduced. The paracentesis was performed. The catheter was removed and a dressing was applied. The patient tolerated the procedure well without immediate post procedural complication. FINDINGS: A total of approximately 3.1 liters of serous fluid was removed. Samples were sent to the laboratory as requested by the clinical team. IMPRESSION: Successful ultrasound-guided paracentesis yielding 3.1 liters of peritoneal fluid. Electronically Signed   By: Sandi Mariscal M.D.   On: 02/07/2019 17:14    Scheduled Inpatient Medications:   . furosemide  40 mg Intravenous Daily  . lactulose  20 g Oral BID  .  morphine injection  2 mg Intravenous Once  . nadolol  20 mg Oral Daily  . pantoprazole  40 mg Oral Daily  . spironolactone  50 mg Oral Daily    Continuous Inpatient Infusions:   . albumin human      PRN Inpatient Medications:  ondansetron **OR** ondansetron (ZOFRAN) IV, oxyCODONE    Assessment:  1. High SAAG ascites - Likely from portal hypertension. Now on diuretic therapy. 2. Hepatocellular carcinoma - per Oncology. 3.  "Hypotension" - Some relative hypotension is common in cirrhotics and in the absence of symptoms or  clear low BP from baseling, diuretics should be continued as necessary for ascites control. I would say a SBP down to 90 should be tolerated in the absence of symptoms.  Plan:  1. Continue current treatment as tolerated. 2. Will follow peripherally.  Call back if I can help.   Teodoro K. Alice Reichert, M.D. 02/09/2019, 1:29 PM

## 2019-02-09 NOTE — Progress Notes (Signed)
[place] [facility] [place] NAME: Andrew Gibbs    MR#:  [id number]  DATE OF BIRTH:  [birth year]  SUBJECTIVE:   Patient has no abdominal pain.  No nausea vomiting or diarrhea. REVIEW OF SYSTEMS:    Review of Systems  Constitutional: Negative for fever, chills weight loss HENT: Negative for ear pain, nosebleeds, congestion, facial swelling, rhinorrhea, neck pain, neck stiffness and ear discharge.   Respiratory: Negative for cough, shortness of breath, wheezing  Cardiovascular: Negative for chest pain, palpitations and leg swelling.  Gastrointestinal: Negative for heartburn, abdominal pain, vomiting, diarrhea or consitpation Genitourinary: Negative for dysuria, urgency, frequency, hematuria Musculoskeletal: Negative for back pain or joint pain Neurological: Negative for dizziness, seizures, syncope, focal weakness,  numbness and headaches.  Hematological: Does not bruise/bleed easily.  Psychiatric/Behavioral: Negative for hallucinations, confusion, dysphoric mood DRUG ALLERGIES:  No Known Allergies  VITALS:  Blood pressure 101/68, pulse 89, temperature 98.5 F (36.9 C), temperature source Oral, resp. rate 17, weight 59.4 kg, SpO2 96 %.  PHYSICAL EXAMINATION:  Constitutional: Appears thin and frail no distress. HENT: Normocephalic. Eyes: Conjunctivae and EOM are normal. PERRLA, has scleral icterus.  Neck: Normal ROM. Neck supple. No JVD. No tracheal deviation. CVS: RRR, S1/S2 +, no murmurs, no gallops, no carotid bruit.  Pulmonary: Effort and breath sounds normal, no stridor, rhonchi, wheezes, rales.  Abdominal: Soft. BS +, slight distension of abdomen without fluid wave no tenderness, rebound or guarding.  Musculoskeletal: Normal range of motion. No edema and no tenderness.  Neuro: Alert. CN 2-12 grossly intact. No focal deficits. Skin: Skin is warm and dry. No rash noted. Psychiatric: Normal mood and affect.  LABORATORY PANEL:   CBC Recent  Labs  Lab 02/07/19 0251  WBC 5.0  HGB 10.1*  HCT 29.7*  PLT 104*   ------------------------------------------------------------------------------------------------------------------  Chemistries  Recent Labs  Lab 02/08/19 0357  NA 136  K 4.0  CL 106  CO2 23  GLUCOSE 84  BUN 10  CREATININE 0.68  CALCIUM 8.0*  AST 1,238*  ALT 176*  ALKPHOS 255*  BILITOT 7.7*   ------------------------------------------------------------------------------------------------------------------  Cardiac Enzymes No results for input(s): TROPONINI in the last 168 hours. ------------------------------------------------------------------------------------------------------------------  RADIOLOGY:  Ct Abdomen Pelvis W Contrast  Result Date: 02/07/2019 CLINICAL DATA:  Stage IV hepatocellular carcinoma. EXAM: CT ABDOMEN AND PELVIS WITH CONTRAST TECHNIQUE: Multidetector CT imaging of the abdomen and pelvis was performed using the standard protocol following bolus administration of intravenous contrast. CONTRAST:  159mL OMNIPAQUE IOHEXOL 300 MG/ML  SOLN COMPARISON:  CTA chest abdomen pelvis 01/03/2019 FINDINGS: Lower chest: Small right pleural effusion noted with right base collapse/consolidation. Hepatobiliary: Segment IV lesion is similar to prior measuring 4.7 x 4.1 cm today compared to 4.7 x 4.0 cm when I remeasure in a similar fashion on the prior study. 3.5 x 3.2 cm lesion inferior right liver (38/2) was 2.8 x 2.3 cm when I remeasure it in a similar fashion on the prior study. 2.8 x 2.6 cm lesion in the lateral segment left liver (15/7) is similar to prior. 4.0 x 3.2 cm lesion in the posterior right liver (15/7) not definitely seen on prior study. Heterogeneous enhancement in the right liver towards the dome is similar to prior with diffuse nodularity throughout the liver. Calcified gallstones evident. No intrahepatic or extrahepatic biliary dilation. Pancreas: No focal mass lesion. No dilatation of the  main duct. No intraparenchymal cyst. No peripancreatic edema. Spleen: Splenic size upper normal to borderline increased. Adrenals/Urinary Tract: No adrenal  nodule or mass. Stable bilateral renal cysts. No hydroureteronephrosis. The urinary bladder appears normal for the degree of distention. Stomach/Bowel: Stomach is unremarkable. No gastric wall thickening. No evidence of outlet obstruction. Duodenum is normally positioned as is the ligament of Treitz. No small bowel obstruction. Colon is not well distended but there does appear to be some edema and wall thickening in the ascending colon. Wall thickening not excluded in the transverse colon. Vascular/Lymphatic: There is abdominal aortic atherosclerosis without aneurysm. Portal vein and superior mesenteric vein are patent. Embolization coils noted right abdomen. There is no gastrohepatic or hepatoduodenal ligament lymphadenopathy. No intraperitoneal or retroperitoneal lymphadenopathy. No pelvic sidewall lymphadenopathy. Reproductive: The prostate gland and seminal vesicles are unremarkable. Other: Large volume ascites. Nodular thickening of the peritoneum noted in the region of both para colic gutters (see right gutter image 49/series 2). Musculoskeletal: Small bilateral groin hernias contain fat and fluid. Lytic bone metastases are seen in the sacrum, lumbar spine, and thoracic spine. T8 metastatic lesion involves the right neural foramen and right epidural space, as characterized on recent MRI. Lesion at the right L1 pedicle demonstrates extradural tumor extension. Posterior right L3 lesion also demonstrates evidence of epidural tumor spread. IMPRESSION: 1. Multiple liver lesions on a background of cirrhosis. Imaging features compatible with multifocal HCC. 4 cm lesion in the posterior aspect of the right liver appears new in the interval. 2. Interval development of large volume ascites. 3. Multiple large bony metastases in the thoracolumbar spine and bony pelvis.  Lesions at T8, L1, and L3 demonstrate epidural spread of tumor. 4. Concern for circumferential wall thickening in the right colon. Infectious/inflammatory colitis would be a consideration. 5. Nodular thickening of the peritoneum in both para colic gutters suspicious for intraperitoneal spread of disease. 6. Small right pleural effusion with right base collapse/consolidation. 7.  Aortic Atherosclerois (ICD10-170.0) Electronically Signed   By: Misty Stanley M.D.   On: 02/07/2019 15:34   US Paracentesis  Result Date: 02/07/2019 INDICATION: History of cirrhosis and multifocal hepatocellular carcinoma with recurrent symptomatic ascites. Please perform ultrasound-guided paracentesis for diagnostic and therapeutic purposes. EXAM: ULTRASOUND-GUIDED PARACENTESIS COMPARISON:  Ultrasound-guided paracentesis performed 01/22/2019 yielding 1.9 L of peritoneal fluid; CT abdomen and pelvis-02/07/2019 MEDICATIONS: None. COMPLICATIONS: None immediate. TECHNIQUE: Informed written consent was obtained from the patient after a discussion of the risks, benefits and alternatives to treatment. A timeout was performed prior to the initiation of the procedure. Initial ultrasound scanning demonstrates a large amount of ascites within the right lower abdominal quadrant. The right lower abdomen was prepped and draped in the usual sterile fashion. 1% lidocaine with epinephrine was used for local anesthesia. An ultrasound image was saved for documentation purposed. An 8 Fr Safe-T-Centesis catheter was introduced. The paracentesis was performed. The catheter was removed and a dressing was applied. The patient tolerated the procedure well without immediate post procedural complication. FINDINGS: A total of approximately 3.1 liters of serous fluid was removed. Samples were sent to the laboratory as requested by the clinical team. IMPRESSION: Successful ultrasound-guided paracentesis yielding 3.1 liters of peritoneal fluid. Electronically Signed    By: Sandi Mariscal M.D.   On: 02/07/2019 17:14     ASSESSMENT AND PLAN:   63 year old male with history of hepatitis C with hepatocellular carcinoma who was sent from oncology office due to elevated liver function test.  1.  Elevated LFTs in the setting of underlying hepatocellular carcinoma and hepatitis C: CT of the abdomen and pelvis: Multiple liver lesions on a background of cirrhosis.  Multiple  bone metastasis and possible peritoneal metastasis. This could be due to progression of Andrew Gibbs. Andrew Gibbs, continue oral spironolactone.  Resume Lasix tomorrow if blood pressure allows.  BP is still soft. Very poor prognosis.  Palliative care consult for possible hospice.  2.  Hepatocellular carcinoma: Follow-up with Andrew Gibbs. Ascites due to above.  S/p paracentesis with 3.1 L fluid withdrawal. 3.  Hypokalemia: Improved with potassium supplement. Waiting for palliative care consult. Management plans discussed with the patient and he is in agreement.  CODE STATUS: Full  TOTAL TIME TAKING CARE OF THIS PATIENT: 25 minutes.  POSSIBLE D/C 1-2 days, DEPENDING ON CLINICAL CONDITION.   Andrew Gibbs M.D on 02/09/2019 at 1:38 PM  Between 7am to 6pm - Pager - [phone] After 6pm go to [email or web address] - password [email or web address] [facility]  Office  [phone]  CC: Primary care physician; Center, [facility]  Note: This dictation was prepared with Dragon dictation along with smaller phrase technology. Any transcriptional errors that result from this process are unintentional.

## 2019-02-09 NOTE — Plan of Care (Signed)

## 2019-02-10 ENCOUNTER — Ambulatory Visit: Payer: Medicare Other

## 2019-02-10 ENCOUNTER — Telehealth: Payer: Self-pay | Admitting: *Deleted

## 2019-02-10 LAB — COMPREHENSIVE METABOLIC PANEL
ALT: 198 U/L — ABNORMAL HIGH (ref 0–44)
AST: 1350 U/L — ABNORMAL HIGH (ref 15–41)
Albumin: 1.6 g/dL — ABNORMAL LOW (ref 3.5–5.0)
Alkaline Phosphatase: 271 U/L — ABNORMAL HIGH (ref 38–126)
Anion gap: 9 (ref 5–15)
BUN: 9 mg/dL (ref 8–23)
CO2: 23 mmol/L (ref 22–32)
Calcium: 8.3 mg/dL — ABNORMAL LOW (ref 8.9–10.3)
Chloride: 104 mmol/L (ref 98–111)
Creatinine, Ser: 0.59 mg/dL — ABNORMAL LOW (ref 0.61–1.24)
GFR calc Af Amer: >60 mL/min (ref >60)
GFR calc non Af Amer: >60 mL/min (ref >60)
Glucose, Bld: 124 mg/dL — ABNORMAL HIGH (ref 70–99)
Potassium: 3.8 mmol/L (ref 3.5–5.1)
Sodium: 136 mmol/L (ref 135–145)
Total Bilirubin: 9 mg/dL — ABNORMAL HIGH (ref 0.3–1.2)
Total Protein: 7.9 g/dL (ref 6.5–8.1)

## 2019-02-10 MED ORDER — SPIRONOLACTONE 50 MG PO TABS: 50.0000 mg | ORAL_TABLET | Freq: Every day | ORAL | 1 refills | Status: AC

## 2019-02-10 NOTE — TOC Transition Note (Signed)
Transition of Care [facility]) - CM/SW Discharge Note   Patient Details  Name: Andrew Gibbs MRN: [id number] Date of Birth: 05-30-1956  Transition of Care [facility]) CM/SW Contact:  Zoeann Mol, Lenice Llamas Phone Number: [phone]  02/10/2019, 2:18 PM   Clinical Narrative:  Clinical Social Worker (CSW) met with patient to discuss D/C plan. Patient reported that he lives in [place] with his brother [name] and plans on returning to [name] home. Patient reported that his brother transports him to the [facility] Patient is agreeable to continue services with [facility] home health. MD did not put in home health orders. Per Lauretta Chester home health representative they can get home health orders from patient's PCP. Patient reported that he has a walker at home. Patient's cane was in the room. Patient will have outpatient palliative. [place] liaison is aware of above. Please reconsult if future social work needs arise. CSW signing off.         Final next level of care: Home w Home Health Services Barriers to Discharge: Barriers Resolved   Patient Goals and CMS Choice     Choice offered to / list presented to : Patient  Discharge Placement                       Discharge Plan and Services                          HH Arranged: [facility] Agency: [facility] Date [place]: 02/10/19 Time [place]: 4081 Representative spoke with at [place]: [place] ([place]) Interventions     Readmission Risk Interventions Readmission Risk Prevention Plan 01/05/2019  Transportation Screening Complete  PCP or Specialist Appt within 3-5 Days Not Complete  HRI or [place] Patient refused  Social Work Consult for [place] Planning/Counseling Patient refused  Palliative Care Screening Not Applicable  Medication Review Press photographer) Complete  Some recent data might be hidden

## 2019-02-10 NOTE — Plan of Care (Signed)
Pt is d/ced home and will be followed by Palliative and Home health.

## 2019-02-10 NOTE — Telephone Encounter (Signed)
Per Dr. Grayland Ormond, I will call to cancel his MRI.

## 2019-02-10 NOTE — Telephone Encounter (Signed)
Pt was actually d/c'ed today, but hold off on rescheduling MRI until I talk with his sister.

## 2019-02-10 NOTE — Discharge Summary (Signed)
[place] at [place] NAME: Andrew Gibbs    MR#:  [id number]  DATE OF BIRTH:  03-05-1956  DATE OF ADMISSION:  02/06/2019   ADMITTING PHYSICIAN: Lance Coon, MD  DATE OF DISCHARGE: 02/10/2019  PRIMARY CARE PHYSICIAN: Center, [facility]   ADMISSION DIAGNOSIS:  Acute liver failure without hepatic coma [K72.00] DISCHARGE DIAGNOSIS:  Principal Problem:   Liver failure ([place]) Active Problems:   Hepatocellular carcinoma ([place])  SECONDARY DIAGNOSIS:   Past Medical History:  Diagnosis Date  . Blood dyscrasia    thrombocytopenia  . Hepatitis C   . Heroin use    HOSPITAL COURSE:  63 year old male with history of hepatitis C with hepatocellular carcinoma who was sent from oncology office due to elevated liver function test.  1.  Elevated LFTs in the setting of underlying hepatocellular carcinoma and hepatitis C: CT of the abdomen and pelvis: Multiple liver lesions on a background of cirrhosis.  Multiple bone metastasis and possible peritoneal metastasis. This could be due to progression of [place]. Dr. Alice Reichert, continue oral spironolactone. Resumed Lasix tomorrow if blood pressure allows. Very poor prognosis.  Palliative care consult for possible hospice.  2.  Hepatocellular carcinoma: Follow-up with Dr. Grayland Ormond. Ascites due to above.  S/p paracentesis with 3.1 L fluid withdrawal. 3.  Hypokalemia: Improved with potassium supplement. Palliative care follow-up as outpatient. DISCHARGE CONDITIONS:  Poor prognosis, discharged to home with palliative care follow-up. CONSULTS OBTAINED:  Treatment Team:  Lloyd Huger, MD DRUG ALLERGIES:  No Known Allergies DISCHARGE MEDICATIONS:   Allergies as of 02/10/2019   No Known Allergies     Medication List    STOP taking these medications   cyclobenzaprine 10 MG tablet Commonly known as: FLEXERIL   traMADol 50 MG tablet Commonly known as: ULTRAM     TAKE these  medications   furosemide 40 MG tablet Commonly known as: LASIX Take 40 mg by mouth daily.   lactulose 10 GM/15ML solution Commonly known as: CHRONULAC Take 30 mLs (20 g total) by mouth 2 (two) times daily. Hold for greater than 2 loose stools daily   multivitamin with minerals Tabs tablet Take 1 tablet by mouth daily.   nadolol 20 MG tablet Commonly known as: CORGARD Take 20 mg by mouth daily.   oxyCODONE 5 MG immediate release tablet Commonly known as: Oxy IR/ROXICODONE Take 1-2 tablets (5-10 mg total) by mouth every 4 (four) hours as needed for moderate pain or severe pain.   pantoprazole 40 MG tablet Commonly known as: Protonix Take 1 tablet (40 mg total) by mouth daily.   spironolactone 50 MG tablet Commonly known as: ALDACTONE Take 1 tablet (50 mg total) by mouth daily. Start taking on: February 11, 2019        DISCHARGE INSTRUCTIONS:  See AVS.  If you experience worsening of your admission symptoms, develop shortness of breath, life threatening emergency, suicidal or homicidal thoughts you must seek medical attention immediately by calling 911 or calling your MD immediately  if symptoms less severe.  You Must read complete instructions/literature along with all the possible adverse reactions/side effects for all the Medicines you take and that have been prescribed to you. Take any new Medicines after you have completely understood and accpet all the possible adverse reactions/side effects.   Please note  You were cared for by a hospitalist during your hospital stay. If you have any questions about your discharge medications or the care you received while you were  in the hospital after you are discharged, you can call the unit and asked to speak with the hospitalist on call if the hospitalist that took care of you is not available. Once you are discharged, your primary care physician will handle any further medical issues. Please note that NO REFILLS for any discharge  medications will be authorized once you are discharged, as it is imperative that you return to your primary care physician (or establish a relationship with a primary care physician if you do not have one) for your aftercare needs so that they can reassess your need for medications and monitor your lab values.    On the day of Discharge:  VITAL SIGNS:  Blood pressure 100/66, pulse 74, temperature 98.6 F (37 C), temperature source Oral, resp. rate 18, weight 59.4 kg, SpO2 98 %. PHYSICAL EXAMINATION:  GENERAL:  63 y.o.-year-old patient lying in the bed with no acute distress.  EYES: Pupils equal, round, reactive to light and accommodation. No scleral icterus. Extraocular muscles intact.  HEENT: Head atraumatic, normocephalic. Oropharynx and nasopharynx clear.  NECK:  Supple, no jugular venous distention. No thyroid enlargement, no tenderness.  LUNGS: Normal breath sounds bilaterally, no wheezing, rales,rhonchi or crepitation. No use of accessory muscles of respiration.  CARDIOVASCULAR: S1, S2 normal. No murmurs, rubs, or gallops.  ABDOMEN: Soft, non-tender, non-distended. Bowel sounds present. No organomegaly or mass.  EXTREMITIES: No pedal edema, cyanosis, or clubbing.  NEUROLOGIC: Cranial nerves II through XII are intact. Muscle strength 5/5 in all extremities. Sensation intact. Gait not checked.  PSYCHIATRIC: The patient is alert and oriented x 3.  SKIN: No obvious rash, lesion, or ulcer.  DATA REVIEW:   CBC Recent Labs  Lab 02/07/19 0251  WBC 5.0  HGB 10.1*  HCT 29.7*  PLT 104*    Chemistries  Recent Labs  Lab 02/10/19 0335  NA 136  K 3.8  CL 104  CO2 23  GLUCOSE 124*  BUN 9  CREATININE 0.59*  CALCIUM 8.3*  AST 1,350*  ALT 198*  ALKPHOS 271*  BILITOT 9.0*     Microbiology Results  Results for orders placed or performed during the hospital encounter of 02/06/19  SARS Coronavirus 2 (CEPHEID - Performed in [place] hospital lab), Hosp Order     Status: None    Collection Time: 02/06/19  5:03 PM   Specimen: Nasopharyngeal Swab  Result Value Ref Range Status   SARS Coronavirus 2 NEGATIVE NEGATIVE Final    Comment: (NOTE) If result is NEGATIVE SARS-CoV-2 target nucleic acids are NOT DETECTED. The SARS-CoV-2 RNA is generally detectable in upper and lower  respiratory specimens during the acute phase of infection. The lowest  concentration of SARS-CoV-2 viral copies this assay can detect is 250  copies / mL. A negative result does not preclude SARS-CoV-2 infection  and should not be used as the sole basis for treatment or other  patient management decisions.  A negative result may occur with  improper specimen collection / handling, submission of specimen other  than nasopharyngeal swab, presence of viral mutation(s) within the  areas targeted by this assay, and inadequate number of viral copies  (<250 copies / mL). A negative result must be combined with clinical  observations, patient history, and epidemiological information. If result is POSITIVE SARS-CoV-2 target nucleic acids are DETECTED. The SARS-CoV-2 RNA is generally detectable in upper and lower  respiratory specimens dur ing the acute phase of infection.  Positive  results are indicative of active infection with SARS-CoV-2.  Clinical  correlation with patient history and other diagnostic information is  necessary to determine patient infection status.  Positive results do  not rule out bacterial infection or co-infection with other viruses. If result is PRESUMPTIVE POSTIVE SARS-CoV-2 nucleic acids MAY BE PRESENT.   A presumptive positive result was obtained on the submitted specimen  and confirmed on repeat testing.  While 2019 novel coronavirus  (SARS-CoV-2) nucleic acids may be present in the submitted sample  additional confirmatory testing may be necessary for epidemiological  and / or clinical management purposes  to differentiate between  SARS-CoV-2 and other Sarbecovirus  currently known to infect humans.  If clinically indicated additional testing with an alternate test  methodology [phone]) is advised. The SARS-CoV-2 RNA is generally  detectable in upper and lower respiratory sp ecimens during the acute  phase of infection. The expected result is Negative. Fact Sheet for Patients:  StrictlyIdeas.no Fact Sheet for Healthcare Providers: BankingDealers.co.za This test is not yet approved or cleared by the [place] FDA and has been authorized for detection and/or diagnosis of SARS-CoV-2 by FDA under an Emergency Use Authorization (EUA).  This EUA will remain in effect (meaning this test can be used) for the duration of the COVID-19 declaration under Section 564(b)(1) of the Act, 21 U.S.C. section 360bbb-3(b)(1), unless the authorization is terminated or revoked sooner. Performed at [facility], [address] [place], [place] [address]   Body fluid culture     Status: None (Preliminary result)   Collection Time: 02/07/19  2:32 PM   Specimen: [facility] Cytology Peritoneal fluid  Result Value Ref Range Status   Specimen Description   Final    PERITONEAL Performed at [facility], [address], [place], [place] [address]    Special Requests   Final    NONE Performed at [facility], [address] [place], [place] [address]    Gram Stain   Final    RARE WBC PRESENT, PREDOMINANTLY MONONUCLEAR NO ORGANISMS SEEN    Culture   Final    NO GROWTH 3 DAYS Performed at [facility], [place] [address], [place], [place] [address]    Report Status PENDING  Incomplete    RADIOLOGY:  No results found.   Management plans discussed with the patient, family and they are in agreement.  CODE STATUS: Full Code   TOTAL TIME TAKING CARE OF THIS PATIENT: 33 minutes.    Demetrios Loll M.D on 02/10/2019 at 1:30 PM  Between 7am to 6pm - Pager - [phone]  After 6pm go to  [email or web address] - Technical brewer [place] [facility]  Office  [phone]  CC: Primary care physician; Center, [facility]   Note: This dictation was prepared with Dragon dictation along with smaller phrase technology. Any transcriptional errors that result from this process are unintentional.

## 2019-02-10 NOTE — Progress Notes (Signed)
Pt is being d/ced home will palliative care.  Will instruct sister that GI dr said to continue w/lasix for ascites as long as BP is 90>.  He will also have home health for any MDE needed.  Requested Dr. Grayland Ormond to call sister, [name] who will also transport him home.  IV has been removed by NT.  Will review d/c paperwork and f/u appts.  Will also encourage pt to take pain medicine if he is in pain.  His family has discouraged that b/c of previous IV drug use.

## 2019-02-10 NOTE — Discharge Instructions (Signed)
Out patient palliative care follow up.  ?

## 2019-02-10 NOTE — Telephone Encounter (Signed)
MRI dept Santiago Glad called stating that patient is scheduled for MRI tomorrow and that he is an inpatient currently. She is asking if you want to cancel or reschedule this appointment.

## 2019-02-10 NOTE — Care Management Important Message (Signed)
Important Message  Patient Details  Name: Andrew Gibbs MRN: [id number] Date of Birth: [birth year]   Medicare Important Message Given:  Yes     [name] [name] 02/10/2019, 11:01 AM

## 2019-02-11 ENCOUNTER — Ambulatory Visit
Admission: RE | Admit: 2019-02-11 | Discharge: 2019-02-11 | Disposition: A | Discharge status: Home / Self Care | Payer: Medicare Other | Source: Ambulatory Visit | Attending: Oncology | Admitting: Oncology

## 2019-02-11 ENCOUNTER — Other Ambulatory Visit: Payer: Self-pay

## 2019-02-11 DIAGNOSIS — C220 Liver cell carcinoma: Secondary | ICD-10-CM | POA: Diagnosis present

## 2019-02-11 LAB — BODY FLUID CULTURE: Culture: NO GROWTH

## 2019-02-11 MED ORDER — GADOBUTROL 1 MMOL/ML IV SOLN
5.0000 mL | Freq: Once | INTRAVENOUS | Status: AC | PRN
  Administered 2019-02-11: 5 mL via INTRAVENOUS

## 2019-02-12 ENCOUNTER — Other Ambulatory Visit: Payer: Self-pay | Admitting: Hospice and Palliative Medicine

## 2019-02-12 ENCOUNTER — Telehealth: Payer: Self-pay | Admitting: Hospice and Palliative Medicine

## 2019-02-12 LAB — CYTOLOGY - NON PAP

## 2019-02-12 MED ORDER — OXYCODONE HCL 5 MG PO TABS: 5.0000 mg | ORAL_TABLET | ORAL | 0 refills | Status: AC | PRN

## 2019-02-12 NOTE — Telephone Encounter (Signed)
I had a telephone call with patient's two sisters [name] and [place]) and brother Rolan Lipa). I updated them on patient's hospitalization. All recognize that patient's cancer is progressing. We spoke candidly about the liklihood that patient is approaching end of life and that he is not felt to have any viable treatment options. We discussed the role of hospice in keeping patient comfortable at home and avoiding future hospitalizations. Family feel that patient would likely want his end of life to be at home. We also discussed code status and I recommended DNR given patient's poor prognosis. Sisters seem to recognize that resuscitation would likely prove futile and verbalized a desire to ensure that patient does not suffer.   All questions answered.   Will explore goals with patient tomorrow when he is seen in the clinic.   Time Total: 20 minutes  Visit consisted of counseling and education dealing with the complex and emotionally intense issues of symptom management and palliative care in the setting of serious and potentially life-threatening illness.Greater than 50%  of this time was spent counseling and coordinating care related to the above assessment and plan.  Signed by: Altha Harm, PhD, NP-C [phone] (Work Cell)

## 2019-02-13 ENCOUNTER — Other Ambulatory Visit: Payer: Self-pay

## 2019-02-13 ENCOUNTER — Encounter: Payer: Self-pay | Admitting: Oncology

## 2019-02-13 ENCOUNTER — Inpatient Hospital Stay: Payer: Medicare Other

## 2019-02-13 ENCOUNTER — Inpatient Hospital Stay (HOSPITAL_BASED_OUTPATIENT_CLINIC_OR_DEPARTMENT_OTHER): Payer: Medicare Other | Admitting: Oncology

## 2019-02-13 ENCOUNTER — Inpatient Hospital Stay (HOSPITAL_BASED_OUTPATIENT_CLINIC_OR_DEPARTMENT_OTHER): Payer: Medicare Other | Admitting: Hospice and Palliative Medicine

## 2019-02-13 VITALS — BP 83/60 | HR 71 | Temp 97.7°F | Wt 130.0 lb

## 2019-02-13 DIAGNOSIS — Z66 Do not resuscitate: Secondary | ICD-10-CM | POA: Diagnosis not present

## 2019-02-13 DIAGNOSIS — Z515 Encounter for palliative care: Secondary | ICD-10-CM | POA: Diagnosis not present

## 2019-02-13 DIAGNOSIS — D649 Anemia, unspecified: Secondary | ICD-10-CM

## 2019-02-13 DIAGNOSIS — Z7189 Other specified counseling: Secondary | ICD-10-CM

## 2019-02-13 DIAGNOSIS — K7290 Hepatic failure, unspecified without coma: Secondary | ICD-10-CM | POA: Diagnosis not present

## 2019-02-13 DIAGNOSIS — Z720 Tobacco use: Secondary | ICD-10-CM

## 2019-02-13 DIAGNOSIS — C220 Liver cell carcinoma: Secondary | ICD-10-CM | POA: Diagnosis present

## 2019-02-13 DIAGNOSIS — R740 Nonspecific elevation of levels of transaminase and lactic acid dehydrogenase [LDH]: Secondary | ICD-10-CM

## 2019-02-13 DIAGNOSIS — G8929 Other chronic pain: Secondary | ICD-10-CM

## 2019-02-13 DIAGNOSIS — C7951 Secondary malignant neoplasm of bone: Secondary | ICD-10-CM | POA: Diagnosis not present

## 2019-02-13 DIAGNOSIS — R630 Anorexia: Secondary | ICD-10-CM

## 2019-02-13 DIAGNOSIS — R109 Unspecified abdominal pain: Secondary | ICD-10-CM

## 2019-02-13 DIAGNOSIS — E876 Hypokalemia: Secondary | ICD-10-CM | POA: Diagnosis not present

## 2019-02-13 DIAGNOSIS — K769 Liver disease, unspecified: Secondary | ICD-10-CM

## 2019-02-13 DIAGNOSIS — B182 Chronic viral hepatitis C: Secondary | ICD-10-CM | POA: Diagnosis not present

## 2019-02-13 DIAGNOSIS — R140 Abdominal distension (gaseous): Secondary | ICD-10-CM | POA: Diagnosis not present

## 2019-02-13 DIAGNOSIS — K7200 Acute and subacute hepatic failure without coma: Secondary | ICD-10-CM

## 2019-02-13 DIAGNOSIS — R5382 Chronic fatigue, unspecified: Secondary | ICD-10-CM

## 2019-02-13 DIAGNOSIS — R5383 Other fatigue: Secondary | ICD-10-CM

## 2019-02-13 LAB — CBC WITH DIFFERENTIAL/PLATELET
Abs Immature Granulocytes: 0.31 10*3/uL — ABNORMAL HIGH (ref 0.00–0.07)
Basophils Absolute: 0 10*3/uL (ref 0.0–0.1)
Basophils Relative: 1 %
Eosinophils Absolute: 0.1 10*3/uL (ref 0.0–0.5)
Eosinophils Relative: 1 %
HCT: 31.6 % — ABNORMAL LOW (ref 39.0–52.0)
Hemoglobin: 10.7 g/dL — ABNORMAL LOW (ref 13.0–17.0)
Immature Granulocytes: 5 %
Lymphocytes Relative: 14 %
Lymphs Abs: 0.8 10*3/uL (ref 0.7–4.0)
MCH: 32.1 pg (ref 26.0–34.0)
MCHC: 33.9 g/dL (ref 30.0–36.0)
MCV: 94.9 fL (ref 80.0–100.0)
Monocytes Absolute: 0.7 10*3/uL (ref 0.1–1.0)
Monocytes Relative: 12 %
Neutro Abs: 4 10*3/uL (ref 1.7–7.7)
Neutrophils Relative %: 67 %
Platelets: 140 10*3/uL — ABNORMAL LOW (ref 150–400)
RBC: 3.33 MIL/uL — ABNORMAL LOW (ref 4.22–5.81)
RDW: 25.3 % — ABNORMAL HIGH (ref 11.5–15.5)
WBC: 5.9 10*3/uL (ref 4.0–10.5)
nRBC: 0 % (ref 0.0–0.2)

## 2019-02-13 LAB — COMPREHENSIVE METABOLIC PANEL
ALT: 266 U/L — ABNORMAL HIGH (ref 0–44)
AST: 1926 U/L — ABNORMAL HIGH (ref 15–41)
Albumin: 1.6 g/dL — ABNORMAL LOW (ref 3.5–5.0)
Alkaline Phosphatase: 291 U/L — ABNORMAL HIGH (ref 38–126)
Anion gap: 10 (ref 5–15)
BUN: 13 mg/dL (ref 8–23)
CO2: 25 mmol/L (ref 22–32)
Calcium: 8.5 mg/dL — ABNORMAL LOW (ref 8.9–10.3)
Chloride: 99 mmol/L (ref 98–111)
Creatinine, Ser: 0.91 mg/dL (ref 0.61–1.24)
GFR calc Af Amer: >60 mL/min (ref >60)
GFR calc non Af Amer: >60 mL/min (ref >60)
Glucose, Bld: 101 mg/dL — ABNORMAL HIGH (ref 70–99)
Potassium: 3.8 mmol/L (ref 3.5–5.1)
Sodium: 134 mmol/L — ABNORMAL LOW (ref 135–145)
Total Bilirubin: 10.7 mg/dL — ABNORMAL HIGH (ref 0.3–1.2)
Total Protein: 8.7 g/dL — ABNORMAL HIGH (ref 6.5–8.1)

## 2019-02-13 NOTE — Progress Notes (Signed)
Andrew Gibbs  [phone] [phone]) [phone]  ID: GRAYTON LOBO OB: Dec 27, 1955  MR#: [id number]  HXT#:[id number]  Patient Care Team: [facility], [facility] as PCP - General (General Practice) Clent Jacks, RN as Registered Nurse  CHIEF COMPLAINT: Stage IV hepatocellular carcinoma, acute liver failure.  INTERVAL HISTORY: Patient returns to clinic today for hospital follow-up and further evaluation.  His performance status is significantly declined and he was recently noted to have acute renal failure which is getting worse.  He continues to have chronic abdominal pain.  He has chronic weakness and fatigue.  He has a poor appetite. He has no neurologic complaints.  He denies any recent fevers.  He has no chest pain, cough, shortness of breath, or hemoptysis.  He has no nausea, vomiting, constipation, or diarrhea.  He has no urinary complaints.  Patient feels generally terrible, but offers no further specific complaints.  REVIEW OF SYSTEMS:   Review of Systems  Constitutional: Positive for malaise/fatigue and weight loss. Negative for fever.  Respiratory: Negative.  Negative for cough, hemoptysis and shortness of breath.   Cardiovascular: Negative.  Negative for chest pain and leg swelling.  Gastrointestinal: Positive for abdominal pain. Negative for constipation and diarrhea.  Genitourinary: Negative.  Negative for dysuria.  Musculoskeletal: Negative.  Negative for back pain.  Skin: Negative.  Negative for rash.  Neurological: Positive for weakness. Negative for focal weakness and headaches.  Psychiatric/Behavioral: Negative.  Negative for substance abuse. The patient is not nervous/anxious.     As per HPI. Otherwise, a complete review of systems is negative.  PAST MEDICAL HISTORY: Past Medical History:  Diagnosis Date   Blood dyscrasia    thrombocytopenia   Hepatitis C    Heroin use     PAST SURGICAL HISTORY: Past Surgical History:   Procedure Laterality Date   ESOPHAGOGASTRODUODENOSCOPY N/A 01/06/2019   Procedure: ESOPHAGOGASTRODUODENOSCOPY (EGD);  Surgeon: Lucilla Lame, MD;  Location: [facility] ENDOSCOPY;  Service: Endoscopy;  Laterality: N/A;   ESOPHAGOGASTRODUODENOSCOPY (EGD) WITH PROPOFOL N/A 12/24/2018   Procedure: ESOPHAGOGASTRODUODENOSCOPY (EGD) WITH PROPOFOL;  Surgeon: Jonathon Bellows, MD;  Location: [facility] ENDOSCOPY;  Service: Gastroenterology;  Laterality: N/A;   IR ANGIOGRAM SELECTIVE EACH ADDITIONAL VESSEL  10/17/2018   IR ANGIOGRAM SELECTIVE EACH ADDITIONAL VESSEL  10/17/2018   IR ANGIOGRAM SELECTIVE EACH ADDITIONAL VESSEL  10/17/2018   IR ANGIOGRAM SELECTIVE EACH ADDITIONAL VESSEL  10/17/2018   IR ANGIOGRAM SELECTIVE EACH ADDITIONAL VESSEL  10/17/2018   IR ANGIOGRAM SELECTIVE EACH ADDITIONAL VESSEL  10/17/2018   IR ANGIOGRAM SELECTIVE EACH ADDITIONAL VESSEL  10/17/2018   IR ANGIOGRAM SELECTIVE EACH ADDITIONAL VESSEL  11/05/2018   IR ANGIOGRAM SELECTIVE EACH ADDITIONAL VESSEL  11/05/2018   IR ANGIOGRAM SELECTIVE EACH ADDITIONAL VESSEL  11/05/2018   IR ANGIOGRAM VISCERAL SELECTIVE  10/17/2018   IR ANGIOGRAM VISCERAL SELECTIVE  10/17/2018   IR ANGIOGRAM VISCERAL SELECTIVE  11/05/2018   IR EMBO ARTERIAL NOT HEMORR HEMANG INC GUIDE ROADMAPPING  10/17/2018   IR EMBO TUMOR ORGAN ISCHEMIA INFARCT INC GUIDE ROADMAPPING  11/05/2018   IR RADIOLOGIST EVAL & MGMT  05/22/2018   IR RADIOLOGIST EVAL & MGMT  08/27/2018   IR US GUIDE VASC ACCESS RIGHT  10/17/2018   IR US GUIDE VASC ACCESS RIGHT  11/05/2018   KNEE SURGERY     TEE WITHOUT CARDIOVERSION N/A 04/22/2018   Procedure: TRANSESOPHAGEAL ECHOCARDIOGRAM (TEE);  Surgeon: Teodoro Spray, MD;  Location: [facility] ORS;  Service: Cardiovascular;  Laterality: N/A;    FAMILY HISTORY: Family  History  Problem Relation Age of Onset   Multiple myeloma Mother     ADVANCED DIRECTIVES (Y/N):  N  HEALTH MAINTENANCE: Social History   Tobacco Use   Smoking status: Current Every Day Smoker     Packs/day: 0.50    Years: 40.00    Pack years: 20.00   Smokeless tobacco: Never Used  Substance Use Topics   Alcohol use: Not Currently    Comment: 40 oz today   Drug use: Not Currently    Types: IV, Cocaine, Heroin, Marijuana    Comment: heroin, states he used to use cocaine     Colonoscopy:  PAP:  Bone density:  Lipid panel:  No Known Allergies  Current Outpatient Medications  Medication Sig Dispense Refill   furosemide (LASIX) 40 MG tablet Take 40 mg by mouth daily.     lactulose (CHRONULAC) 10 GM/15ML solution Take 30 mLs (20 g total) by mouth 2 (two) times daily. Hold for greater than 2 loose stools daily 236 mL 0   Multiple Vitamin (MULTIVITAMIN WITH MINERALS) TABS tablet Take 1 tablet by mouth daily. 30 tablet 0   nadolol (CORGARD) 20 MG tablet Take 20 mg by mouth daily.     oxyCODONE (OXY IR/ROXICODONE) 5 MG immediate release tablet Take 1-2 tablets (5-10 mg total) by mouth every 4 (four) hours as needed for moderate pain or severe pain. 45 tablet 0   pantoprazole (PROTONIX) 40 MG tablet Take 1 tablet (40 mg total) by mouth daily. 30 tablet 2   spironolactone (ALDACTONE) 50 MG tablet Take 1 tablet (50 mg total) by mouth daily. 30 tablet 1   No current facility-administered medications for this visit.     OBJECTIVE: Vitals:   02/13/19 1052  BP: (!) 83/60  Pulse: 71  Temp: 97.7 F (36.5 C)     Body mass index is 20.36 kg/m.    ECOG FS:3 - Symptomatic, >50% confined to bed  General: Ill-appearing, cachectic, no acute distress. Eyes: Pink conjunctiva, anicteric sclera. HEENT: Normocephalic, moist mucous membranes, clear oropharnyx. Lungs: Clear to auscultation bilaterally. Heart: Regular rate and rhythm. No rubs, murmurs, or gallops. Abdomen: Distended, mildly tender. Musculoskeletal: No edema, cyanosis, or clubbing. Neuro: Alert, answering all questions appropriately. Cranial nerves grossly intact. Skin: No rashes or petechiae noted. Psych: Normal  affect.  LAB RESULTS:  Lab Results  Component Value Date   NA 134 (L) 02/13/2019   K 3.8 02/13/2019   CL 99 02/13/2019   CO2 25 02/13/2019   GLUCOSE 101 (H) 02/13/2019   BUN 13 02/13/2019   CREATININE 0.91 02/13/2019   CALCIUM 8.5 (L) 02/13/2019   PROT 8.7 (H) 02/13/2019   ALBUMIN 1.6 (L) 02/13/2019   AST 1,926 (H) 02/13/2019   ALT 266 (H) 02/13/2019   ALKPHOS 291 (H) 02/13/2019   BILITOT 10.7 (H) 02/13/2019   GFRNONAA >60 02/13/2019   GFRAA >60 02/13/2019    Lab Results  Component Value Date   WBC 5.9 02/13/2019   NEUTROABS 4.0 02/13/2019   HGB 10.7 (L) 02/13/2019   HCT 31.6 (L) 02/13/2019   MCV 94.9 02/13/2019   PLT 140 (L) 02/13/2019     STUDIES: Ct Abdomen Pelvis W Contrast  Result Date: 02/07/2019 CLINICAL DATA:  Stage IV hepatocellular carcinoma. EXAM: CT ABDOMEN AND PELVIS WITH CONTRAST TECHNIQUE: Multidetector CT imaging of the abdomen and pelvis was performed using the standard protocol following bolus administration of intravenous contrast. CONTRAST:  121m OMNIPAQUE IOHEXOL 300 MG/ML  SOLN COMPARISON:  CTA chest abdomen pelvis  01/03/2019 FINDINGS: Lower chest: Small right pleural effusion noted with right base collapse/consolidation. Hepatobiliary: Segment IV lesion is similar to prior measuring 4.7 x 4.1 cm today compared to 4.7 x 4.0 cm when I remeasure in a similar fashion on the prior study. 3.5 x 3.2 cm lesion inferior right liver (38/2) was 2.8 x 2.3 cm when I remeasure it in a similar fashion on the prior study. 2.8 x 2.6 cm lesion in the lateral segment left liver (15/7) is similar to prior. 4.0 x 3.2 cm lesion in the posterior right liver (15/7) not definitely seen on prior study. Heterogeneous enhancement in the right liver towards the dome is similar to prior with diffuse nodularity throughout the liver. Calcified gallstones evident. No intrahepatic or extrahepatic biliary dilation. Pancreas: No focal mass lesion. No dilatation of the main duct. No  intraparenchymal cyst. No peripancreatic edema. Spleen: Splenic size upper normal to borderline increased. Adrenals/Urinary Tract: No adrenal nodule or mass. Stable bilateral renal cysts. No hydroureteronephrosis. The urinary bladder appears normal for the degree of distention. Stomach/Bowel: Stomach is unremarkable. No gastric wall thickening. No evidence of outlet obstruction. Duodenum is normally positioned as is the ligament of Treitz. No small bowel obstruction. Colon is not well distended but there does appear to be some edema and wall thickening in the ascending colon. Wall thickening not excluded in the transverse colon. Vascular/Lymphatic: There is abdominal aortic atherosclerosis without aneurysm. Portal vein and superior mesenteric vein are patent. Embolization coils noted right abdomen. There is no gastrohepatic or hepatoduodenal ligament lymphadenopathy. No intraperitoneal or retroperitoneal lymphadenopathy. No pelvic sidewall lymphadenopathy. Reproductive: The prostate gland and seminal vesicles are unremarkable. Other: Large volume ascites. Nodular thickening of the peritoneum noted in the region of both para colic gutters (see right gutter image 49/series 2). Musculoskeletal: Small bilateral groin hernias contain fat and fluid. Lytic bone metastases are seen in the sacrum, lumbar spine, and thoracic spine. T8 metastatic lesion involves the right neural foramen and right epidural space, as characterized on recent MRI. Lesion at the right L1 pedicle demonstrates extradural tumor extension. Posterior right L3 lesion also demonstrates evidence of epidural tumor spread. IMPRESSION: 1. Multiple liver lesions on a background of cirrhosis. Imaging features compatible with multifocal HCC. 4 cm lesion in the posterior aspect of the right liver appears new in the interval. 2. Interval development of large volume ascites. 3. Multiple large bony metastases in the thoracolumbar spine and bony pelvis. Lesions at  T8, L1, and L3 demonstrate epidural spread of tumor. 4. Concern for circumferential wall thickening in the right colon. Infectious/inflammatory colitis would be a consideration. 5. Nodular thickening of the peritoneum in both para colic gutters suspicious for intraperitoneal spread of disease. 6. Small right pleural effusion with right base collapse/consolidation. 7.  Aortic Atherosclerois (ICD10-170.0) Electronically Signed   By: Misty Stanley M.D.   On: 02/07/2019 15:34   Mr Liver W OM Contrast  Result Date: 02/11/2019 CLINICAL DATA:  Hepatocellular carcinoma. EXAM: MRI ABDOMEN WITHOUT AND WITH CONTRAST TECHNIQUE: Multiplanar multisequence MR imaging of the abdomen was performed both before and after the administration of intravenous contrast. CONTRAST:  5 cc Gadavist via hand injection COMPARISON:  CT scan 02/07/2019. FINDINGS: Lower chest: Small bilateral pleural effusions, right greater than left. Hepatobiliary: Innumerable hypoenhancing nodules are seen in the liver parenchyma. Dominant lesion in the medial segment left liver measures 4.5 cm on image 37/80. This lesion shows no arterial phase hyperenhancement although there is washout with subtle capsule formation. Imaging features compatible with LIRADS  4 lesion. 3.6 cm index lesion inferior right liver (57/80) shows hypoenhancement with washout and subtle rim enhancement. This lesion also compatible with LIRADS 4. Another dominant index lesion in the posterior right hepatic dome measures 2.5 cm, hypo enhances and has a peripheral rim. This lesion compatible with LIRADS 4. No lesions demonstrating arterial phase hyperenhancement. Signal void in the gallbladder compatible with known calcified stones. No biliary dilatation. Pancreas: No focal mass lesion. No dilatation of the main duct. No intraparenchymal cyst. No peripancreatic edema. Spleen:  Spleen size upper normal. Adrenals/Urinary Tract: No adrenal nodule or mass. Bilateral renal cysts.  Stomach/Bowel: Stomach is unremarkable. No gastric wall thickening. No evidence of outlet obstruction. No small bowel or colonic dilatation within the visualized abdomen. Vascular/Lymphatic: No abdominal aortic aneurysm. Portal vein, superior mesenteric vein, and splenic vein are patent. No abdominal lymphadenopathy. Other:  Large volume ascites. Musculoskeletal: Multiple bone metastases identified including thoracolumbar spine. L1 lesion involves the right vertebral body and pedicle with encroachment into the neural foramen and spinal canal. Lesion in the T7 a region also involves the right neural foramen with epidural tumor spread. Spinous process involvement noted at T11. Metastatic lesion noted lateral lower right rib. IMPRESSION: 1. Cirrhotic liver morphology with nodular parenchyma. Some of the dominant nodules today are compatible with LIRADS 4, neither definitely nor probably benign. No definite LIRADS 5 lesion to indicate definite hepatocellular carcinoma. 2. Numerous bony metastases as evaluated previously. Lesions at the T7-8 region and L1 show evidence of epidural tumor spread. 3. Moderate to large volume ascites 4. Small bilateral pleural effusions. Electronically Signed   By: Misty Stanley M.D.   On: 02/11/2019 15:07   US Paracentesis  Result Date: 02/07/2019 INDICATION: History of cirrhosis and multifocal hepatocellular carcinoma with recurrent symptomatic ascites. Please perform ultrasound-guided paracentesis for diagnostic and therapeutic purposes. EXAM: ULTRASOUND-GUIDED PARACENTESIS COMPARISON:  Ultrasound-guided paracentesis performed 01/22/2019 yielding 1.9 L of peritoneal fluid; CT abdomen and pelvis-02/07/2019 MEDICATIONS: None. COMPLICATIONS: None immediate. TECHNIQUE: Informed written consent was obtained from the patient after a discussion of the risks, benefits and alternatives to treatment. A timeout was performed prior to the initiation of the procedure. Initial ultrasound scanning  demonstrates a large amount of ascites within the right lower abdominal quadrant. The right lower abdomen was prepped and draped in the usual sterile fashion. 1% lidocaine with epinephrine was used for local anesthesia. An ultrasound image was saved for documentation purposed. An 8 Fr Safe-T-Centesis catheter was introduced. The paracentesis was performed. The catheter was removed and a dressing was applied. The patient tolerated the procedure well without immediate post procedural complication. FINDINGS: A total of approximately 3.1 liters of serous fluid was removed. Samples were sent to the laboratory as requested by the clinical team. IMPRESSION: Successful ultrasound-guided paracentesis yielding 3.1 liters of peritoneal fluid. Electronically Signed   By: Sandi Mariscal M.D.   On: 02/07/2019 17:14   US Paracentesis  Result Date: 01/22/2019 INDICATION: History of hepatitis C and hepatocellular carcinoma, now with symptomatic intra-abdominal ascites. Request made for ultrasound-guided paracentesis for diagnostic and therapeutic purposes. EXAM: ULTRASOUND-GUIDED PARACENTESIS COMPARISON:  None. MEDICATIONS: None. COMPLICATIONS: None immediate. TECHNIQUE: Informed written consent was obtained from the patient after a discussion of the risks, benefits and alternatives to treatment. A timeout was performed prior to the initiation of the procedure. Initial ultrasound scanning demonstrates a moderate amount of ascites within the right lower abdominal quadrant. The right lower abdomen was prepped and draped in the usual sterile fashion. 1% lidocaine with epinephrine was used  for local anesthesia. An ultrasound image was saved for documentation purposed. An 8 Fr Safe-T-Centesis catheter was introduced. The paracentesis was performed. The catheter was removed and a dressing was applied. The patient tolerated the procedure well without immediate post procedural complication. FINDINGS: A total of approximately 1.9 liters of  serous fluid was removed. Samples were sent to the laboratory as requested by the clinical team. IMPRESSION: Successful ultrasound-guided paracentesis yielding 1.9 liters of peritoneal fluid. Electronically Signed   By: Sandi Mariscal M.D.   On: 01/22/2019 15:35    ASSESSMENT: Stage IV hepatocellular carcinoma, acute liver failure.  PLAN:    1.  Hepatocellular carcinoma: Recent imaging revealed widespread stage IV disease.  Patient is not a candidate for systemic chemotherapy given his acute liver failure and decreased performance status.  Appreciate palliative care input.  After lengthy discussion with the family, it was agreed upon that no further follow-up is necessary and patient is enrolling in hospice. 2.  Liver failure: Multifactorial.  Laboratory work from today indicates worsening disease with rising AST, ALT, and total bilirubin.  Hospice as above. 3.  Pain: Continue current narcotic regimen as prescribed.   4.  Anemia: Mild.  Patient's hemoglobin is 10.7 today.  No further laboratory work necessary.  Patient expressed understanding and was in agreement with this plan. He also understands that He can call clinic at any time with any questions, concerns, or complaints.   Cancer Staging Hepatocellular carcinoma [facility]) Staging form: Liver, AJCC 8th Edition - Clinical stage from 05/17/2018: Stage IB (cT1b, cN0, cM0) - Signed by Lloyd Huger, MD on 05/17/2018   Lloyd Huger, MD   02/13/2019 11:57 AM

## 2019-02-13 NOTE — Progress Notes (Signed)
Patient stated that he had been feeling tired and that his abdomen had been bothering him. Patient is here today to go over his MRI results.

## 2019-02-13 NOTE — Progress Notes (Signed)
[place]  [phone] [phone]   Name: Andrew Gibbs Date: 02/13/2019 MRN: [id number]  DOB: October 17, 1955  Patient Care Team: [facility] [place] as PCP - General (General Practice) Clent Jacks, RN as Registered Nurse    REASON FOR CONSULTATION: Palliative Care consult requested for this 63 y.o. 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  CT of the abdomen and pelvis in the hospital showed evidence of disease progression with innumerable liver lesions and multiple bone metastases with possible peritoneal metastasis.  Patient was referred to palliative care to help address goals and manage ongoing symptoms.   SOCIAL HISTORY:     reports that he has been smoking. He has a 20.00 pack-year smoking history. He has never used smokeless tobacco. He reports previous alcohol use. He reports previous drug use. Drugs: IV, Cocaine, Heroin, and Marijuana.   Patient is not married.  Patient has several sisters and a brother who are involved in his care.  ADVANCE DIRECTIVES:  Not on file but sister, [name], is reportedly his healthcare power of attorney  CODE STATUS: DNR DNR order signed on 02/13/2019  PAST MEDICAL HISTORY: Past Medical History:  Diagnosis Date   Blood dyscrasia    thrombocytopenia   Hepatitis C    Heroin use     PAST SURGICAL HISTORY:  Past Surgical History:  Procedure Laterality Date   ESOPHAGOGASTRODUODENOSCOPY N/A 01/06/2019   Procedure: ESOPHAGOGASTRODUODENOSCOPY (EGD);  Surgeon: Lucilla Lame, MD;  Location: [facility] ENDOSCOPY;  Service: Endoscopy;  Laterality: N/A;   ESOPHAGOGASTRODUODENOSCOPY (EGD) WITH PROPOFOL N/A 12/24/2018   Procedure: ESOPHAGOGASTRODUODENOSCOPY (EGD) WITH PROPOFOL;  Surgeon: Jonathon Bellows, MD;  Location: [facility] ENDOSCOPY;   Service: Gastroenterology;  Laterality: N/A;   IR ANGIOGRAM SELECTIVE EACH ADDITIONAL VESSEL  10/17/2018   IR ANGIOGRAM SELECTIVE EACH ADDITIONAL VESSEL  10/17/2018   IR ANGIOGRAM SELECTIVE EACH ADDITIONAL VESSEL  10/17/2018   IR ANGIOGRAM SELECTIVE EACH ADDITIONAL VESSEL  10/17/2018   IR ANGIOGRAM SELECTIVE EACH ADDITIONAL VESSEL  10/17/2018   IR ANGIOGRAM SELECTIVE EACH ADDITIONAL VESSEL  10/17/2018   IR ANGIOGRAM SELECTIVE EACH ADDITIONAL VESSEL  10/17/2018   IR ANGIOGRAM SELECTIVE EACH ADDITIONAL VESSEL  11/05/2018   IR ANGIOGRAM SELECTIVE EACH ADDITIONAL VESSEL  11/05/2018   IR ANGIOGRAM SELECTIVE EACH ADDITIONAL VESSEL  11/05/2018   IR ANGIOGRAM VISCERAL SELECTIVE  10/17/2018   IR ANGIOGRAM VISCERAL SELECTIVE  10/17/2018   IR ANGIOGRAM VISCERAL SELECTIVE  11/05/2018   IR EMBO ARTERIAL NOT HEMORR HEMANG INC GUIDE ROADMAPPING  10/17/2018   IR EMBO TUMOR ORGAN ISCHEMIA INFARCT INC GUIDE ROADMAPPING  11/05/2018   IR RADIOLOGIST EVAL & MGMT  05/22/2018   IR RADIOLOGIST EVAL & MGMT  08/27/2018   IR US GUIDE VASC ACCESS RIGHT  10/17/2018   IR US GUIDE VASC ACCESS RIGHT  11/05/2018   KNEE SURGERY     TEE WITHOUT CARDIOVERSION N/A 04/22/2018   Procedure: TRANSESOPHAGEAL ECHOCARDIOGRAM (TEE);  Surgeon: Teodoro Spray, MD;  Location: [facility] ORS;  Service: Cardiovascular;  Laterality: N/A;    HEMATOLOGY/ONCOLOGY HISTORY:  Oncology History  Hepatocellular carcinoma ([place])  05/17/2018 Initial Diagnosis   Hepatocellular carcinoma ([place])   05/17/2018 Cancer Staging   Staging form: Liver, AJCC 8th Edition - Clinical stage from 05/17/2018: Stage IB (cT1b, cN0, cM0) - Signed by Lloyd Huger, MD on 05/17/2018     ALLERGIES:  has No Known Allergies.  MEDICATIONS:  Current Outpatient Medications  Medication Sig Dispense Refill   furosemide (LASIX) 40 MG tablet Take 40 mg by mouth daily.     lactulose (CHRONULAC) 10 GM/15ML solution Take 30 mLs (20 g total) by mouth 2 (two) times daily. Hold for  greater than 2 loose stools daily 236 mL 0   Multiple Vitamin (MULTIVITAMIN WITH MINERALS) TABS tablet Take 1 tablet by mouth daily. 30 tablet 0   nadolol (CORGARD) 20 MG tablet Take 20 mg by mouth daily.     oxyCODONE (OXY IR/ROXICODONE) 5 MG immediate release tablet Take 1-2 tablets (5-10 mg total) by mouth every 4 (four) hours as needed for moderate pain or severe pain. 45 tablet 0   pantoprazole (PROTONIX) 40 MG tablet Take 1 tablet (40 mg total) by mouth daily. 30 tablet 2   spironolactone (ALDACTONE) 50 MG tablet Take 1 tablet (50 mg total) by mouth daily. 30 tablet 1   No current facility-administered medications for this visit.     VITAL SIGNS: There were no vitals taken for this visit. There were no vitals filed for this visit.  Estimated body mass index is 20.36 kg/m as calculated from the following:   Height as of 01/03/19: '5\' 7"'  (1.702 m).   Weight as of an earlier encounter on 02/13/19: 130 lb (59 kg).  LABS: CBC:    Component Value Date/Time   WBC 5.9 02/13/2019 1040   HGB 10.7 (L) 02/13/2019 1040   HGB 14.5 01/21/2014 1700   HCT 31.6 (L) 02/13/2019 1040   HCT 42.7 01/21/2014 1700   PLT 140 (L) 02/13/2019 1040   PLT 148 (L) 01/21/2014 1700   MCV 94.9 02/13/2019 1040   MCV 102 (H) 01/21/2014 1700   NEUTROABS 4.0 02/13/2019 1040   NEUTROABS 2.9 03/27/2013 0539   LYMPHSABS 0.8 02/13/2019 1040   LYMPHSABS 2.0 03/27/2013 0539   MONOABS 0.7 02/13/2019 1040   MONOABS 0.4 03/27/2013 0539   EOSABS 0.1 02/13/2019 1040   EOSABS 0.1 03/27/2013 0539   BASOSABS 0.0 02/13/2019 1040   BASOSABS 0.0 03/27/2013 0539   Comprehensive Metabolic Panel:    Component Value Date/Time   NA 134 (L) 02/13/2019 1040   NA 142 07/29/2018 1441   NA 141 01/21/2014 1700   K 3.8 02/13/2019 1040   K 4.0 01/21/2014 1700   CL 99 02/13/2019 1040   CL 105 01/21/2014 1700   CO2 25 02/13/2019 1040   CO2 25 01/21/2014 1700   BUN 13 02/13/2019 1040   BUN 7 (L) 07/29/2018 1441   BUN 9  01/21/2014 1700   CREATININE 0.91 02/13/2019 1040   CREATININE 0.90 01/21/2014 1700   GLUCOSE 101 (H) 02/13/2019 1040   GLUCOSE 95 01/21/2014 1700   CALCIUM 8.5 (L) 02/13/2019 1040   CALCIUM 8.1 (L) 01/21/2014 1700   AST 1,926 (H) 02/13/2019 1040   AST 80 (H) 01/21/2014 1700   ALT 266 (H) 02/13/2019 1040   ALT 28 01/21/2014 1700   ALKPHOS 291 (H) 02/13/2019 1040   ALKPHOS 103 01/21/2014 1700   BILITOT 10.7 (H) 02/13/2019 1040   BILITOT 1.4 (H) 07/29/2018 1441   BILITOT 0.6 01/21/2014 1700   PROT 8.7 (H) 02/13/2019 1040   PROT 8.2 07/29/2018 1441   PROT 8.9 (H) 01/21/2014 1700   ALBUMIN 1.6 (L) 02/13/2019 1040   ALBUMIN 3.0 (L) 07/29/2018 1441   ALBUMIN 3.4 01/21/2014 1700    RADIOGRAPHIC STUDIES: Ct Abdomen Pelvis W Contrast  Result Date: 02/07/2019 CLINICAL DATA:  Stage IV  hepatocellular carcinoma. EXAM: CT ABDOMEN AND PELVIS WITH CONTRAST TECHNIQUE: Multidetector CT imaging of the abdomen and pelvis was performed using the standard protocol following bolus administration of intravenous contrast. CONTRAST:  154m OMNIPAQUE IOHEXOL 300 MG/ML  SOLN COMPARISON:  CTA chest abdomen pelvis 01/03/2019 FINDINGS: Lower chest: Small right pleural effusion noted with right base collapse/consolidation. Hepatobiliary: Segment IV lesion is similar to prior measuring 4.7 x 4.1 cm today compared to 4.7 x 4.0 cm when I remeasure in a similar fashion on the prior study. 3.5 x 3.2 cm lesion inferior right liver (38/2) was 2.8 x 2.3 cm when I remeasure it in a similar fashion on the prior study. 2.8 x 2.6 cm lesion in the lateral segment left liver (15/7) is similar to prior. 4.0 x 3.2 cm lesion in the posterior right liver (15/7) not definitely seen on prior study. Heterogeneous enhancement in the right liver towards the dome is similar to prior with diffuse nodularity throughout the liver. Calcified gallstones evident. No intrahepatic or extrahepatic biliary dilation. Pancreas: No focal mass lesion. No  dilatation of the main duct. No intraparenchymal cyst. No peripancreatic edema. Spleen: Splenic size upper normal to borderline increased. Adrenals/Urinary Tract: No adrenal nodule or mass. Stable bilateral renal cysts. No hydroureteronephrosis. The urinary bladder appears normal for the degree of distention. Stomach/Bowel: Stomach is unremarkable. No gastric wall thickening. No evidence of outlet obstruction. Duodenum is normally positioned as is the ligament of Treitz. No small bowel obstruction. Colon is not well distended but there does appear to be some edema and wall thickening in the ascending colon. Wall thickening not excluded in the transverse colon. Vascular/Lymphatic: There is abdominal aortic atherosclerosis without aneurysm. Portal vein and superior mesenteric vein are patent. Embolization coils noted right abdomen. There is no gastrohepatic or hepatoduodenal ligament lymphadenopathy. No intraperitoneal or retroperitoneal lymphadenopathy. No pelvic sidewall lymphadenopathy. Reproductive: The prostate gland and seminal vesicles are unremarkable. Other: Large volume ascites. Nodular thickening of the peritoneum noted in the region of both para colic gutters (see right gutter image 49/series 2). Musculoskeletal: Small bilateral groin hernias contain fat and fluid. Lytic bone metastases are seen in the sacrum, lumbar spine, and thoracic spine. T8 metastatic lesion involves the right neural foramen and right epidural space, as characterized on recent MRI. Lesion at the right L1 pedicle demonstrates extradural tumor extension. Posterior right L3 lesion also demonstrates evidence of epidural tumor spread. IMPRESSION: 1. Multiple liver lesions on a background of cirrhosis. Imaging features compatible with multifocal HCC. 4 cm lesion in the posterior aspect of the right liver appears new in the interval. 2. Interval development of large volume ascites. 3. Multiple large bony metastases in the thoracolumbar  spine and bony pelvis. Lesions at T8, L1, and L3 demonstrate epidural spread of tumor. 4. Concern for circumferential wall thickening in the right colon. Infectious/inflammatory colitis would be a consideration. 5. Nodular thickening of the peritoneum in both para colic gutters suspicious for intraperitoneal spread of disease. 6. Small right pleural effusion with right base collapse/consolidation. 7.  Aortic Atherosclerois (ICD10-170.0) Electronically Signed   By: EMisty StanleyM.D.   On: 02/07/2019 15:34   Mr Liver W WKAContrast  Result Date: 02/11/2019 CLINICAL DATA:  Hepatocellular carcinoma. EXAM: MRI ABDOMEN WITHOUT AND WITH CONTRAST TECHNIQUE: Multiplanar multisequence MR imaging of the abdomen was performed both before and after the administration of intravenous contrast. CONTRAST:  5 cc Gadavist via hand injection COMPARISON:  CT scan 02/07/2019. FINDINGS: Lower chest: Small bilateral pleural effusions, right greater than  left. Hepatobiliary: Innumerable hypoenhancing nodules are seen in the liver parenchyma. Dominant lesion in the medial segment left liver measures 4.5 cm on image 37/80. This lesion shows no arterial phase hyperenhancement although there is washout with subtle capsule formation. Imaging features compatible with LIRADS 4 lesion. 3.6 cm index lesion inferior right liver (57/80) shows hypoenhancement with washout and subtle rim enhancement. This lesion also compatible with LIRADS 4. Another dominant index lesion in the posterior right hepatic dome measures 2.5 cm, hypo enhances and has a peripheral rim. This lesion compatible with LIRADS 4. No lesions demonstrating arterial phase hyperenhancement. Signal void in the gallbladder compatible with known calcified stones. No biliary dilatation. Pancreas: No focal mass lesion. No dilatation of the main duct. No intraparenchymal cyst. No peripancreatic edema. Spleen:  Spleen size upper normal. Adrenals/Urinary Tract: No adrenal nodule or mass.  Bilateral renal cysts. Stomach/Bowel: Stomach is unremarkable. No gastric wall thickening. No evidence of outlet obstruction. No small bowel or colonic dilatation within the visualized abdomen. Vascular/Lymphatic: No abdominal aortic aneurysm. Portal vein, superior mesenteric vein, and splenic vein are patent. No abdominal lymphadenopathy. Other:  Large volume ascites. Musculoskeletal: Multiple bone metastases identified including thoracolumbar spine. L1 lesion involves the right vertebral body and pedicle with encroachment into the neural foramen and spinal canal. Lesion in the T7 a region also involves the right neural foramen with epidural tumor spread. Spinous process involvement noted at T11. Metastatic lesion noted lateral lower right rib. IMPRESSION: 1. Cirrhotic liver morphology with nodular parenchyma. Some of the dominant nodules today are compatible with LIRADS 4, neither definitely nor probably benign. No definite LIRADS 5 lesion to indicate definite hepatocellular carcinoma. 2. Numerous bony metastases as evaluated previously. Lesions at the T7-8 region and L1 show evidence of epidural tumor spread. 3. Moderate to large volume ascites 4. Small bilateral pleural effusions. Electronically Signed   By: Misty Stanley M.D.   On: 02/11/2019 15:07   US Paracentesis  Result Date: 02/07/2019 INDICATION: History of cirrhosis and multifocal hepatocellular carcinoma with recurrent symptomatic ascites. Please perform ultrasound-guided paracentesis for diagnostic and therapeutic purposes. EXAM: ULTRASOUND-GUIDED PARACENTESIS COMPARISON:  Ultrasound-guided paracentesis performed 01/22/2019 yielding 1.9 L of peritoneal fluid; CT abdomen and pelvis-02/07/2019 MEDICATIONS: None. COMPLICATIONS: None immediate. TECHNIQUE: Informed written consent was obtained from the patient after a discussion of the risks, benefits and alternatives to treatment. A timeout was performed prior to the initiation of the procedure.  Initial ultrasound scanning demonstrates a large amount of ascites within the right lower abdominal quadrant. The right lower abdomen was prepped and draped in the usual sterile fashion. 1% lidocaine with epinephrine was used for local anesthesia. An ultrasound image was saved for documentation purposed. An 8 Fr Safe-T-Centesis catheter was introduced. The paracentesis was performed. The catheter was removed and a dressing was applied. The patient tolerated the procedure well without immediate post procedural complication. FINDINGS: A total of approximately 3.1 liters of serous fluid was removed. Samples were sent to the laboratory as requested by the clinical team. IMPRESSION: Successful ultrasound-guided paracentesis yielding 3.1 liters of peritoneal fluid. Electronically Signed   By: Sandi Mariscal M.D.   On: 02/07/2019 17:14   US Paracentesis  Result Date: 01/22/2019 INDICATION: History of hepatitis C and hepatocellular carcinoma, now with symptomatic intra-abdominal ascites. Request made for ultrasound-guided paracentesis for diagnostic and therapeutic purposes. EXAM: ULTRASOUND-GUIDED PARACENTESIS COMPARISON:  None. MEDICATIONS: None. COMPLICATIONS: None immediate. TECHNIQUE: Informed written consent was obtained from the patient after a discussion of the risks, benefits and alternatives to treatment.  A timeout was performed prior to the initiation of the procedure. Initial ultrasound scanning demonstrates a moderate amount of ascites within the right lower abdominal quadrant. The right lower abdomen was prepped and draped in the usual sterile fashion. 1% lidocaine with epinephrine was used for local anesthesia. An ultrasound image was saved for documentation purposed. An 8 Fr Safe-T-Centesis catheter was introduced. The paracentesis was performed. The catheter was removed and a dressing was applied. The patient tolerated the procedure well without immediate post procedural complication. FINDINGS: A total of  approximately 1.9 liters of serous fluid was removed. Samples were sent to the laboratory as requested by the clinical team. IMPRESSION: Successful ultrasound-guided paracentesis yielding 1.9 liters of peritoneal fluid. Electronically Signed   By: Sandi Mariscal M.D.   On: 01/22/2019 15:35    PERFORMANCE STATUS (ECOG) : 3 - Symptomatic, >50% confined to bed  Review of Systems Unless otherwise noted, a complete review of systems is negative.  Physical Exam General: frail appearing, thin Pulmonary: Unlabored Abdomen: Distended Extremities: Edema Skin: no rashes Neurological: Weakness but otherwise nonfocal  IMPRESSION: I met with patient today in the clinic and spoke with his 2 sisters and brother by phone.  Introduced palliative care services and attempted establish therapeutic rapport.  Together, we reviewed patient's current medical problems.  Patient's transaminases continue to rise.  AST of 1926 today with total bili of 10.7, all markedly worse since discharge from the hospital.  Patient is not felt to be a viable treatment candidate at this time and hospice is being recommended.  I had a lengthy conversation with patient about him nearing end-of-life.  He said "I have expected this."  Patient ultimately opted for comfort and wanted to go home with hospice.  Siblings agreed with this decision.  We discussed CODE STATUS and patient verbalized a desire not to be resuscitated or have his life prolonged artificially machines.  I signed a DNR order for him to take home.  Case discussed with Dr. Grayland Ormond who also saw patient and spoke with family.  PLAN: -Best supportive care -Hospice referral sent -DNR   Patient expressed understanding and was in agreement with this plan. He also understands that He can call the clinic at any time with any questions, concerns, or complaints.     Time Total: 30 minutes  Visit consisted of counseling and education dealing with the complex and  emotionally intense issues of symptom management and palliative care in the setting of serious and potentially life-threatening illness.Greater than 50%  of this time was spent counseling and coordinating care related to the above assessment and plan.  Signed by: Altha Harm, PhD, NP-C [phone] (Work Cell)

## 2019-02-14 ENCOUNTER — Telehealth: Payer: Self-pay | Admitting: *Deleted

## 2019-02-14 LAB — AFP TUMOR MARKER

## 2019-02-14 NOTE — Telephone Encounter (Signed)
Per Secure chat with Dr Tasia Catchings ok. as long as he does not have horrible distension of ascites causing discomfort, can hold lasxi I returned call to [facility] and advised of doctor response. She repeated back to me that she can hold as long as his abdominal is not distended causing him pain

## 2019-02-14 NOTE — Telephone Encounter (Signed)
Sister called reporting that when patient was discharged he was given prescription for Lasix 40 mg daily with instructions to hold it if systolic bp is <55. His bp is 107/41 and she is concerned that the diastolic bp is too low to give and would like to know what to do. Please advise

## 2019-02-18 ENCOUNTER — Other Ambulatory Visit: Payer: Self-pay | Admitting: *Deleted

## 2019-02-18 ENCOUNTER — Telehealth: Payer: Self-pay | Admitting: *Deleted

## 2019-02-18 DIAGNOSIS — R188 Other ascites: Secondary | ICD-10-CM

## 2019-02-18 NOTE — Telephone Encounter (Signed)
Orders placed in Epic, will fax form to specialty scheduling to arrange procedure.

## 2019-02-18 NOTE — Telephone Encounter (Signed)
Patient and family are requesting a Pleurex cath be inserted for Paracentesis at home instead of having to go to hospital frequently for them. His abdominal girth measurement is up 1 cm from last check now 91 cm measured at umilicus. She requests this be scheduled and a call placed to brother or sister with the appointment.

## 2019-02-18 NOTE — Telephone Encounter (Signed)
We can send a note to IR. Thanks!

## 2019-02-24 ENCOUNTER — Telehealth: Payer: Self-pay

## 2019-02-24 DIAGNOSIS — C220 Liver cell carcinoma: Secondary | ICD-10-CM

## 2019-02-24 NOTE — Telephone Encounter (Signed)
Called patient's sister Tomasa Hosteller to let her know that his brother's PleuRx Cath would be on 03/05/2019 and to be at the [place] at 7:30 AM. She agreed.

## 2019-02-25 ENCOUNTER — Other Ambulatory Visit: Payer: Self-pay

## 2019-02-26 ENCOUNTER — Ambulatory Visit: Payer: Medicare Other | Admitting: Radiation Oncology

## 2019-02-28 ENCOUNTER — Other Ambulatory Visit: Payer: Medicare Other

## 2019-02-28 ENCOUNTER — Encounter: Payer: Self-pay | Admitting: Emergency Medicine

## 2019-02-28 ENCOUNTER — Other Ambulatory Visit: Payer: Self-pay

## 2019-02-28 ENCOUNTER — Inpatient Hospital Stay
Admission: EM | Admit: 2019-02-28 | Discharge: 2019-03-18 | DRG: 435 | Disposition: E | Attending: Internal Medicine | Admitting: Internal Medicine

## 2019-02-28 DIAGNOSIS — R64 Cachexia: Secondary | ICD-10-CM | POA: Diagnosis present

## 2019-02-28 DIAGNOSIS — K920 Hematemesis: Secondary | ICD-10-CM | POA: Diagnosis present

## 2019-02-28 DIAGNOSIS — I959 Hypotension, unspecified: Secondary | ICD-10-CM | POA: Diagnosis present

## 2019-02-28 DIAGNOSIS — Z66 Do not resuscitate: Secondary | ICD-10-CM | POA: Diagnosis present

## 2019-02-28 DIAGNOSIS — K7210 Chronic hepatic failure without coma: Secondary | ICD-10-CM | POA: Diagnosis present

## 2019-02-28 DIAGNOSIS — Z79899 Other long term (current) drug therapy: Secondary | ICD-10-CM | POA: Diagnosis not present

## 2019-02-28 DIAGNOSIS — Z515 Encounter for palliative care: Secondary | ICD-10-CM | POA: Diagnosis present

## 2019-02-28 DIAGNOSIS — Z20828 Contact with and (suspected) exposure to other viral communicable diseases: Secondary | ICD-10-CM | POA: Diagnosis present

## 2019-02-28 DIAGNOSIS — D72829 Elevated white blood cell count, unspecified: Secondary | ICD-10-CM | POA: Diagnosis present

## 2019-02-28 DIAGNOSIS — B1920 Unspecified viral hepatitis C without hepatic coma: Secondary | ICD-10-CM | POA: Diagnosis present

## 2019-02-28 DIAGNOSIS — N179 Acute kidney failure, unspecified: Secondary | ICD-10-CM | POA: Diagnosis present

## 2019-02-28 DIAGNOSIS — C220 Liver cell carcinoma: Secondary | ICD-10-CM | POA: Diagnosis present

## 2019-02-28 DIAGNOSIS — D696 Thrombocytopenia, unspecified: Secondary | ICD-10-CM | POA: Diagnosis present

## 2019-02-28 DIAGNOSIS — D630 Anemia in neoplastic disease: Secondary | ICD-10-CM | POA: Diagnosis present

## 2019-02-28 DIAGNOSIS — Z807 Family history of other malignant neoplasms of lymphoid, hematopoietic and related tissues: Secondary | ICD-10-CM

## 2019-02-28 DIAGNOSIS — E875 Hyperkalemia: Secondary | ICD-10-CM | POA: Diagnosis present

## 2019-02-28 DIAGNOSIS — K767 Hepatorenal syndrome: Secondary | ICD-10-CM | POA: Diagnosis present

## 2019-02-28 DIAGNOSIS — F17210 Nicotine dependence, cigarettes, uncomplicated: Secondary | ICD-10-CM | POA: Diagnosis present

## 2019-02-28 DIAGNOSIS — K1379 Other lesions of oral mucosa: Secondary | ICD-10-CM

## 2019-02-28 DIAGNOSIS — R1084 Generalized abdominal pain: Secondary | ICD-10-CM

## 2019-02-28 DIAGNOSIS — R791 Abnormal coagulation profile: Secondary | ICD-10-CM | POA: Diagnosis present

## 2019-02-28 DIAGNOSIS — R9431 Abnormal electrocardiogram [ECG] [EKG]: Secondary | ICD-10-CM

## 2019-02-28 DIAGNOSIS — D649 Anemia, unspecified: Secondary | ICD-10-CM

## 2019-02-28 DIAGNOSIS — R7989 Other specified abnormal findings of blood chemistry: Secondary | ICD-10-CM

## 2019-02-28 DIAGNOSIS — G893 Neoplasm related pain (acute) (chronic): Secondary | ICD-10-CM | POA: Diagnosis present

## 2019-02-28 LAB — PATHOLOGIST SMEAR REVIEW

## 2019-02-28 LAB — CBC WITH DIFFERENTIAL/PLATELET
Abs Immature Granulocytes: 0.7 10*3/uL — ABNORMAL HIGH (ref 0.00–0.07)
Band Neutrophils: 2 %
Basophils Absolute: 0 10*3/uL (ref 0.0–0.1)
Basophils Relative: 0 %
Eosinophils Absolute: 0 10*3/uL (ref 0.0–0.5)
Eosinophils Relative: 0 %
HCT: 26.6 % — ABNORMAL LOW (ref 39.0–52.0)
Hemoglobin: 8.1 g/dL — ABNORMAL LOW (ref 13.0–17.0)
Lymphocytes Relative: 9 %
Lymphs Abs: 1.2 10*3/uL (ref 0.7–4.0)
MCH: 32.9 pg (ref 26.0–34.0)
MCHC: 30.5 g/dL (ref 30.0–36.0)
MCV: 108.1 fL — ABNORMAL HIGH (ref 80.0–100.0)
Metamyelocytes Relative: 2 %
Monocytes Absolute: 1.6 10*3/uL — ABNORMAL HIGH (ref 0.1–1.0)
Monocytes Relative: 12 %
Myelocytes: 3 %
Neutro Abs: 9.8 10*3/uL — ABNORMAL HIGH (ref 1.7–7.7)
Neutrophils Relative %: 72 %
Platelets: 149 10*3/uL — ABNORMAL LOW (ref 150–400)
RBC: 2.46 MIL/uL — ABNORMAL LOW (ref 4.22–5.81)
RDW: 27.2 % — ABNORMAL HIGH (ref 11.5–15.5)
WBC: 13.2 10*3/uL — ABNORMAL HIGH (ref 4.0–10.5)
nRBC: 2 % — ABNORMAL HIGH (ref 0.0–0.2)

## 2019-02-28 LAB — MAGNESIUM: Magnesium: 3 mg/dL — ABNORMAL HIGH (ref 1.7–2.4)

## 2019-02-28 LAB — COMPREHENSIVE METABOLIC PANEL
ALT: 814 U/L — ABNORMAL HIGH (ref 0–44)
AST: 6330 U/L — ABNORMAL HIGH (ref 15–41)
Albumin: 1.2 g/dL — ABNORMAL LOW (ref 3.5–5.0)
Alkaline Phosphatase: 285 U/L — ABNORMAL HIGH (ref 38–126)
Anion gap: UNDETERMINED (ref 5–15)
BUN: 67 mg/dL — ABNORMAL HIGH (ref 8–23)
CO2: <7 mmol/L — ABNORMAL LOW (ref 22–32)
Calcium: 9.2 mg/dL (ref 8.9–10.3)
Chloride: 96 mmol/L — ABNORMAL LOW (ref 98–111)
Creatinine, Ser: 5.18 mg/dL — ABNORMAL HIGH (ref 0.61–1.24)
GFR calc Af Amer: 13 mL/min — ABNORMAL LOW (ref >60)
GFR calc non Af Amer: 11 mL/min — ABNORMAL LOW (ref >60)
Glucose, Bld: 73 mg/dL (ref 70–99)
Potassium: >7.5 mmol/L (ref 3.5–5.1)
Sodium: 134 mmol/L — ABNORMAL LOW (ref 135–145)
Total Bilirubin: 14.3 mg/dL — ABNORMAL HIGH (ref 0.3–1.2)
Total Protein: 7.1 g/dL (ref 6.5–8.1)

## 2019-02-28 LAB — PROTIME-INR
INR: 3.7 — ABNORMAL HIGH (ref 0.8–1.2)
Prothrombin Time: 36.3 seconds — ABNORMAL HIGH (ref 11.4–15.2)

## 2019-02-28 LAB — LACTIC ACID, PLASMA: Lactic Acid, Venous: >11 mmol/L (ref 0.5–1.9)

## 2019-02-28 LAB — LIPASE, BLOOD: Lipase: 36 U/L (ref 11–51)

## 2019-02-28 LAB — APTT: aPTT: 68 seconds — ABNORMAL HIGH (ref 24–36)

## 2019-02-28 LAB — PROCALCITONIN: Procalcitonin: 0.89 ng/mL

## 2019-02-28 LAB — SARS CORONAVIRUS 2 BY RT PCR (HOSPITAL ORDER, PERFORMED IN ~~LOC~~ HOSPITAL LAB): SARS Coronavirus 2: NEGATIVE

## 2019-02-28 MED ORDER — MORPHINE SULFATE (PF) 4 MG/ML IV SOLN
4.0000 mg | Freq: Once | INTRAVENOUS | Status: AC
  Administered 2019-02-28: 03:00:00 4 mg via INTRAVENOUS
  Filled 2019-02-28: qty 1

## 2019-02-28 MED ORDER — HALOPERIDOL LACTATE 5 MG/ML IJ SOLN: 0.5000 mg | INTRAMUSCULAR | Status: DC | PRN

## 2019-02-28 MED ORDER — GLYCOPYRROLATE 0.2 MG/ML IJ SOLN
0.2000 mg | INTRAMUSCULAR | Status: DC | PRN
  Filled 2019-02-28: qty 1

## 2019-02-28 MED ORDER — SODIUM CHLORIDE 0.9% FLUSH
3.0000 mL | Freq: Two times a day (BID) | INTRAVENOUS | Status: DC
  Administered 2019-02-28: 05:00:00 3 mL via INTRAVENOUS

## 2019-02-28 MED ORDER — SODIUM CHLORIDE 0.9 % IV SOLN: 250.0000 mL | INTRAVENOUS | Status: DC | PRN

## 2019-02-28 MED ORDER — HALOPERIDOL 0.5 MG PO TABS
0.5000 mg | ORAL_TABLET | ORAL | Status: DC | PRN
  Filled 2019-02-28: qty 1

## 2019-02-28 MED ORDER — SODIUM CHLORIDE 0.9% FLUSH: 3.0000 mL | Freq: Two times a day (BID) | INTRAVENOUS | Status: DC

## 2019-02-28 MED ORDER — ONDANSETRON 4 MG PO TBDP
4.0000 mg | ORAL_TABLET | Freq: Four times a day (QID) | ORAL | Status: DC | PRN
  Filled 2019-02-28: qty 1

## 2019-02-28 MED ORDER — SODIUM CHLORIDE 0.9% FLUSH: 3.0000 mL | INTRAVENOUS | Status: DC | PRN

## 2019-02-28 MED ORDER — GLYCOPYRROLATE 1 MG PO TABS
1.0000 mg | ORAL_TABLET | ORAL | Status: DC | PRN
  Filled 2019-02-28: qty 1

## 2019-02-28 MED ORDER — HALOPERIDOL LACTATE 2 MG/ML PO CONC
0.5000 mg | ORAL | Status: DC | PRN
  Filled 2019-02-28: qty 0.3

## 2019-02-28 MED ORDER — ONDANSETRON HCL 4 MG/2ML IJ SOLN
4.0000 mg | INTRAMUSCULAR | Status: AC
  Administered 2019-02-28: 4 mg via INTRAVENOUS
  Filled 2019-02-28: qty 2

## 2019-02-28 MED ORDER — HYDROMORPHONE HCL 1 MG/ML IJ SOLN
0.5000 mg | INTRAMUSCULAR | Status: DC | PRN
  Administered 2019-02-28: 0.5 mg via INTRAVENOUS
  Filled 2019-02-28: qty 1

## 2019-02-28 MED ORDER — ONDANSETRON HCL 4 MG/2ML IJ SOLN: 4.0000 mg | Freq: Four times a day (QID) | INTRAMUSCULAR | Status: DC | PRN

## 2019-02-28 MED ORDER — SODIUM CHLORIDE 0.9 % IV BOLUS
1000.0000 mL | Freq: Once | INTRAVENOUS | Status: AC
  Administered 2019-02-28: 1000 mL via INTRAVENOUS

## 2019-02-28 MED ORDER — MORPHINE 100MG IN NS 100ML (1MG/ML) PREMIX INFUSION
1.0000 mg/h | INTRAVENOUS | Status: DC
  Administered 2019-02-28: 1 mg/h via INTRAVENOUS
  Filled 2019-02-28: qty 100

## 2019-03-05 ENCOUNTER — Ambulatory Visit: Payer: Medicare Other

## 2019-03-18 NOTE — Progress Notes (Signed)
Andrew Gibbs has been admitted for decompensated liver failure and was on comfort care and was followed by hospice as outpatient.  This patient passed away at 7:50 AM this morning.  Family has been at bedside. Patient passed away before me seeing him this morning

## 2019-03-18 NOTE — ED Provider Notes (Signed)
[facility] Emergency Department Provider Note  ____________________________________________   First MD Initiated Contact with Patient 03/21/2019 0206     (approximate)  I have reviewed the triage vital signs and the nursing notes.   HISTORY  Chief Complaint Bleeding/Bruising  Level 5 caveat:  history/ROS limited by acute/critical illness  HPI Andrew Gibbs is a 63 y.o. male with a history of hepatocellular carcinoma who was admitted with decompensated liver failure and made DNR after palliative care and oncology consults several weeks ago.  He is on home hospice and comes in by EMS tonight for evaluation of bleeding from the mouth.  History is minimal at this time.  The patient seems to be a great deal of abdominal pain and has been moaning but is able to follow simple commands.  According to EMS, they were called out for bleeding from the mouth.  The hospice nurse was present at the house and administered morphine 2.5 mg IV and then left.  The family indicated the desire to "know where the blood is coming from".  The patient does come in with his DNR/DNI [place] form.  He is not able to provide any additional history.    Past Medical History:  Diagnosis Date  . Blood dyscrasia    thrombocytopenia  . Hepatitis C   . Heroin use     Patient Active Problem List   Diagnosis Date Noted  . Hepatic carcinoma ([place]) 03-21-19  . Liver failure ([place]) 02/06/2019  . Neoplasm related pain   . Palliative care encounter   . Hematemesis with nausea   . GI bleed 01/03/2019  . Candidemia ([place]) 07/23/2018  . Discitis of cervical region 07/23/2018  . 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 Hepatocellular carcinoma ([place]) 05/17/2018  . Malnutrition of moderate degree 04/28/2018  . Abscess of right arm 04/17/2018    Past Surgical History:  Procedure Laterality Date  . ESOPHAGOGASTRODUODENOSCOPY N/A 01/06/2019   Procedure: ESOPHAGOGASTRODUODENOSCOPY (EGD);   Surgeon: Lucilla Lame, MD;  Location: [facility] ENDOSCOPY;  Service: Endoscopy;  Laterality: N/A;  . ESOPHAGOGASTRODUODENOSCOPY (EGD) WITH PROPOFOL N/A 12/24/2018   Procedure: ESOPHAGOGASTRODUODENOSCOPY (EGD) WITH PROPOFOL;  Surgeon: Jonathon Bellows, MD;  Location: [facility] ENDOSCOPY;  Service: Gastroenterology;  Laterality: N/A;  . IR ANGIOGRAM SELECTIVE EACH ADDITIONAL VESSEL  10/17/2018  . IR ANGIOGRAM SELECTIVE EACH ADDITIONAL VESSEL  10/17/2018  . IR ANGIOGRAM SELECTIVE EACH ADDITIONAL VESSEL  10/17/2018  . IR ANGIOGRAM SELECTIVE EACH ADDITIONAL VESSEL  10/17/2018  . IR ANGIOGRAM SELECTIVE EACH ADDITIONAL VESSEL  10/17/2018  . IR ANGIOGRAM SELECTIVE EACH ADDITIONAL VESSEL  10/17/2018  . IR ANGIOGRAM SELECTIVE EACH ADDITIONAL VESSEL  10/17/2018  . IR ANGIOGRAM SELECTIVE EACH ADDITIONAL VESSEL  11/05/2018  . IR ANGIOGRAM SELECTIVE EACH ADDITIONAL VESSEL  11/05/2018  . IR ANGIOGRAM SELECTIVE EACH ADDITIONAL VESSEL  11/05/2018  . IR ANGIOGRAM VISCERAL SELECTIVE  10/17/2018  . IR ANGIOGRAM VISCERAL SELECTIVE  10/17/2018  . IR ANGIOGRAM VISCERAL SELECTIVE  11/05/2018  . IR EMBO ARTERIAL NOT HEMORR HEMANG INC GUIDE ROADMAPPING  10/17/2018  . IR EMBO TUMOR ORGAN ISCHEMIA INFARCT INC GUIDE ROADMAPPING  11/05/2018  . IR RADIOLOGIST EVAL & MGMT  05/22/2018  . IR RADIOLOGIST EVAL & MGMT  08/27/2018  . IR US GUIDE VASC ACCESS RIGHT  10/17/2018  . IR US GUIDE VASC ACCESS RIGHT  11/05/2018  . KNEE SURGERY    . TEE WITHOUT CARDIOVERSION N/A 04/22/2018   Procedure: TRANSESOPHAGEAL ECHOCARDIOGRAM (TEE);  Surgeon: Teodoro Spray, MD;  Location: [facility]  ORS;  Service: Cardiovascular;  Laterality: N/A;    Prior to Admission medications   Medication Sig Start Date End Date Taking? Authorizing Provider  furosemide (LASIX) 40 MG tablet Take 40 mg by mouth daily. 01/21/19   [provider]  lactulose (CHRONULAC) 10 GM/15ML solution Take 30 mLs (20 g total) by mouth 2 (two) times daily. Hold for greater than 2 loose stools daily 01/13/19   Otila Back, MD  Multiple Vitamin (MULTIVITAMIN WITH MINERALS) TABS tablet Take 1 tablet by mouth daily. 05/03/18   Epifanio Lesches, MD  nadolol (CORGARD) 20 MG tablet Take 20 mg by mouth daily. 12/25/18   [provider]  oxyCODONE (OXY IR/ROXICODONE) 5 MG immediate release tablet Take 1-2 tablets (5-10 mg total) by mouth every 4 (four) hours as needed for moderate pain or severe pain. 02/12/19   Borders, Kirt Boys, NP  pantoprazole (PROTONIX) 40 MG tablet Take 1 tablet (40 mg total) by mouth daily. 11/05/18   Corrie Mckusick, DO  spironolactone (ALDACTONE) 50 MG tablet Take 1 tablet (50 mg total) by mouth daily. 02/11/19   Demetrios Loll, MD    Allergies Patient has no known allergies.  Family History  Problem Relation Age of Onset  . Multiple myeloma Mother     Social History Social History   Tobacco Use  . Smoking status: Current Every Day Smoker    Packs/day: 0.50    Years: 40.00    Pack years: 20.00  . Smokeless tobacco: Never Used  Substance Use Topics  . Alcohol use: Not Currently    Comment: 40 oz today  . Drug use: Not Currently    Types: IV, Cocaine, Heroin, Marijuana    Comment: heroin, states he used to use cocaine    Review of Systems Level 5 caveat:  history/ROS limited by acute/critical illness   ____________________________________________   PHYSICAL EXAM:  VITAL SIGNS: ED Triage Vitals  Enc Vitals Group     BP 2019-03-24 0210 (!) 83/56     Pulse Rate 03/24/2019 0210 (!) 145     Resp 03-24-2019 0210 (!) 30     Temp 03-24-19 0224 (!) 94.5 F (34.7 C)     Temp Source 2019/03/24 0224 Rectal     SpO2 24-Mar-2019 0210 100 %     Weight --      Height --      Head Circumference --      Peak Flow --      Pain Score --      Pain Loc --      Pain Edu? --      Excl. in [place]? --     Constitutional: Chronically ill-appearing, cachectic, consistent with reported history of end-of-life and palliative care. Eyes: Scleral icterus Head: Atraumatic. Nose: No  congestion/rhinnorhea. Mouth/Throat: Mucous membranes are dry.  Poor dentition with multiple missing teeth.  There is dried blood on his tongue and some blood clots near the back of his mouth, particularly around the back left lower teeth, but there is no active bleeding at this time.  It is difficult to appreciate if this was a dental source or if he had hematemesis or hemoptysis. Neck: No stridor.  No meningeal signs.   Cardiovascular: Tachycardia, regular rhythm. Good peripheral circulation. Grossly normal heart sounds. Respiratory: Normal respiratory effort.  No retractions. Gastrointestinal: Abdomen is tense and mildly distended.  Generalized tenderness to palpation throughout the abdomen cardiac exam is consistent with ascites. Musculoskeletal: Thin and cachectic extremities.  No acute gross abnormalities  Neurologic: Moving all 4 extremities but unable to participate in neurological exam. Skin:  Skin is warm and dry.  Patient has extensive scarring on bilateral upper extremities consistent with reported prior history of IV drug use.  ____________________________________________   LABS (all labs ordered are listed, but only abnormal results are displayed)  Labs Reviewed  CBC WITH DIFFERENTIAL/PLATELET - Abnormal; Notable for the following components:      Result Value   WBC 13.2 (*)    RBC 2.46 (*)    Hemoglobin 8.1 (*)    HCT 26.6 (*)    MCV 108.1 (*)    RDW 27.2 (*)    Platelets 149 (*)    nRBC 2.0 (*)    Neutro Abs 9.8 (*)    Monocytes Absolute 1.6 (*)    Abs Immature Granulocytes 0.70 (*)    All other components within normal limits  COMPREHENSIVE METABOLIC PANEL - Abnormal; Notable for the following components:   Sodium 134 (*)    Potassium >7.5 (*)    Chloride 96 (*)    CO2 <7 (*)    BUN 67 (*)    Creatinine, Ser 5.18 (*)    Albumin 1.2 (*)    AST 6,330 (*)    ALT 814 (*)    Alkaline Phosphatase 285 (*)    Total Bilirubin 14.3 (*)    GFR calc non Af Amer 11 (*)     GFR calc Af Amer 13 (*)    All other components within normal limits  LACTIC ACID, PLASMA - Abnormal; Notable for the following components:   Lactic Acid, Venous >11.0 (*)    All other components within normal limits  PROTIME-INR - Abnormal; Notable for the following components:   Prothrombin Time 36.3 (*)    INR 3.7 (*)    All other components within normal limits  APTT - Abnormal; Notable for the following components:   aPTT 68 (*)    All other components within normal limits  MAGNESIUM - Abnormal; Notable for the following components:   Magnesium 3.0 (*)    All other components within normal limits  SARS CORONAVIRUS 2 (HOSPITAL ORDER, [place] [facility])  SARS CORONAVIRUS 2  LIPASE, BLOOD  PROCALCITONIN  PATHOLOGIST SMEAR REVIEW   ____________________________________________  EKG  ED ECG REPORT I, Hinda Kehr, the attending physician, personally viewed and interpreted this ECG.  Date: 03/27/19 EKG Time: 2:10 AM Rate: Heart rate is actually about 75 but it is being interpreted as 159 by the computer Rhythm: Sinus rhythm QRS Axis: normal Intervals: Profound QRS widening, peaked T waves ST/T Wave abnormalities: Non-specific ST segment / T-wave changes, but no clear evidence of acute ischemia. Narrative Interpretation: no definitive evidence of acute ischemia but strongly suggestive of highly abnormal electrolytes  ____________________________________________  RADIOLOGY I, Hinda Kehr, personally viewed and evaluated these images (plain radiographs) as part of my medical decision making, as well as reviewing the written report by the radiologist.  ED MD interpretation: No indication for emergent imaging  Official radiology report(s): No results found.  ____________________________________________   PROCEDURES   Procedure(s) performed (including Critical Care):  Procedures   ____________________________________________   INITIAL  IMPRESSION / MDM / [place] / ED COURSE  As part of my medical decision making, I reviewed the following data within the [place] History obtained from family, Nursing notes reviewed and incorporated, Labs reviewed , EKG interpreted , Old chart reviewed, Discussed with admitting physician  and Notes  from prior ED visits   Differential diagnosis includes, but is not limited to, decompensated liver failure, thrombocytopenia, coagulation abnormality due to his liver failure, severe electrolyte abnormality, renal failure.  The patient is obviously nearing the end of his life.  I reviewed the medical record extensively and saw that on his last admission a few weeks ago he was still alert and oriented and agreed with the plan for palliative care.  He appears to have decompensated substantially over the last few weeks of home hospice.  Shortly after my initial assessment the patient's sister, [name], who is listed in the documentation as his healthcare power of attorney, arrived to provide additional information.  The patient lives with another sibling, a brother, who called EMS tonight because the patient was obviously an abdominal pain.  Additionally the patient was having bleeding in his mouth but is not actively bleeding at this time.  I had an extensive discussion with [name] and she confirmed on multiple occasions that they do not want to provide any treatment, they simply want to keep him as comfortable as possible consistent with the palliative care/end-of-life orders.  I have explained that I cannot give a definitive timeline but the patient is critically ill and that his demise could be imminent.  She understands this and her only concern is that he be comfortable; she knows that he is going to die soon, whether that be tonight or within the next few days to weeks.  She agreed with my initial plan implemented prior to her arrival to check lab work as a prognostic  indicator.  The patient is getting some IV fluids now because he was initially hypotensive and I administered another morphine 4 mg IV as well as Zofran 4 mg IV (I saw that this has been ordered for him as outpatient medications even though of course it has the risk of QTC prolongation).  I discussed various options with [name] in terms of disposition and we both agree that it is likely best that he stay in the hospital for end-of-life care because his current presentation is beyond what can be handled at home, particularly given that he can no longer take oral pain medicine and that he is requiring IV meds.  I anticipate admission for end-of-life care.      Clinical Course as of Feb 27 405  March 13, 2019  0300 Anemia and thrombocytopenia as well as mild leukocytosis  CBC with Differential/Platelet(!) [CF]  0301 INR is up to 3.7, up from just over 1 during his last hospitalization.  Protime-INR(!) [CF]  [id number] Lactic Acid, Venous(!!): >11.0 [CF]  0329 Lipase: 36 [CF]  0329 Profound hyperkalemia, acute renal failure, and worsening liver failure and hyperbilirubinemia.  Likely hepatorenal syndrome.    Updated sister/HCPOA who is at bedside.  Explained that imminent cardiac arrest is possible with that degree of hyperkalemia.  She understands and we again confirmed the plan for end of life / comfort care only . Will not provide any additional fluids.  Comprehensive metabolic panel(!!) [CF]  8242 Discussed case with Gardiner Barefoot with the hospitalist service.  She understands the plan for comfort care only and will admit.  She is going to speak with the patient and HCPOA now.   [CF]  0403 SARS Coronavirus 2: NEGATIVE [CF]    Clinical Course User Index [CF] Hinda Kehr, MD     ____________________________________________  FINAL CLINICAL IMPRESSION(S) / ED DIAGNOSES  Final diagnoses:  Chronic liver failure without hepatic coma ([place])  Elevated lactic acid level  Prolonged QT interval   Hyperkalemia  Generalized abdominal pain  Oral bleeding  Thrombocytopenia ([facility])  Anemia, unspecified type  Hepatorenal syndrome (H[facility])  Acute renal failure, unspecified acute renal failure type ([place])     MEDICATIONS GIVEN DURING THIS VISIT:  Medications  haloperidol (HALDOL) tablet 0.5 mg (has no administration in time range)    Or  haloperidol (HALDOL) 2 MG/ML solution 0.5 mg (has no administration in time range)    Or  haloperidol lactate (HALDOL) injection 0.5 mg (has no administration in time range)  ondansetron (ZOFRAN-ODT) disintegrating tablet 4 mg (has no administration in time range)    Or  ondansetron (ZOFRAN) injection 4 mg (has no administration in time range)  sodium chloride flush (NS) 0.9 % injection 3 mL (has no administration in time range)  sodium chloride flush (NS) 0.9 % injection 3 mL (has no administration in time range)  0.9 %  sodium chloride infusion (has no administration in time range)  HYDROmorphone (DILAUDID) injection 0.5 mg (has no administration in time range)  glycopyrrolate (ROBINUL) tablet 1 mg (has no administration in time range)    Or  glycopyrrolate (ROBINUL) injection 0.2 mg (has no administration in time range)    Or  glycopyrrolate (ROBINUL) injection 0.2 mg (has no administration in time range)  sodium chloride flush (NS) 0.9 % injection 3 mL (has no administration in time range)  sodium chloride flush (NS) 0.9 % injection 3 mL (has no administration in time range)  0.9 %  sodium chloride infusion (has no administration in time range)  sodium chloride flush (NS) 0.9 % injection 3 mL (has no administration in time range)  sodium chloride flush (NS) 0.9 % injection 3 mL (has no administration in time range)  0.9 %  sodium chloride infusion (has no administration in time range)  morphine 4 MG/ML injection 4 mg (4 mg Intravenous Given 03/23/19 0235)  ondansetron (ZOFRAN) injection 4 mg (4 mg Intravenous Given Mar 23, 2019 0235)  sodium chloride  0.9 % bolus 1,000 mL (1,000 mLs Intravenous New Bag/Given 03/23/2019 0220)     ED Discharge Orders    None      *Please note:  Andrew Gibbs was evaluated in Emergency Department on 2019-03-23 for the symptoms described in the history of present illness. He was evaluated in the context of the global COVID-19 pandemic, which necessitated consideration that the patient might be at risk for infection with the SARS-CoV-2 virus that causes COVID-19. Institutional protocols and algorithms that pertain to the evaluation of patients at risk for COVID-19 are in a state of rapid change based on information released by regulatory bodies including the CDC and federal and state organizations. These policies and algorithms were followed during the patient's care in the ED.  Some ED evaluations and interventions may be delayed as a result of limited staffing during the pandemic.*  Note:  This document was prepared using Dragon voice recognition software and may include unintentional dictation errors.   Hinda Kehr, MD 03/23/2019 [phone]

## 2019-03-18 NOTE — ED Triage Notes (Signed)
Pt to room 3 via EMS from home; pt is hospice pt for liver CA, DNR; EMS reports pt noted blood in mouth and wanted him transported here for eval; st hospice nurse admin 0.25mg  morphine at 130am

## 2019-03-18 NOTE — ED Notes (Signed)
ED TO INPATIENT HANDOFF REPORT  ED Nurse Name and Phone #: Lattie Haw [phone]  S Name/Age/Gender Andrew Gibbs 63 y.o. male Room/Bed: ED03A/ED03A  Code Status   Code Status: DNR  Home/SNF/Other Home Patient oriented to: self, place, time and situation Is this baseline? Yes   Triage Complete: Triage complete  Chief Complaint Hemorrhage  Triage Note Pt to room 3 via EMS from home; pt is hospice pt for liver CA, DNR; EMS reports pt noted blood in mouth and wanted him transported here for eval; st hospice nurse admin 0.25mg  morphine at 130am   Allergies No Known Allergies  Level of Care/Admitting Diagnosis ED Disposition    ED Disposition Condition [place]: [place] [[id number]]  Level of Care: Med-Surg [16]  Covid Evaluation: Confirmed COVID Negative  Diagnosis: Hepatic carcinoma [facility]) [[id number]]  Admitting Physician: Mayer Camel [[id number]]  Attending Physician: Mayer Camel [[id number]]  Estimated length of stay: past midnight tomorrow  Certification:: I certify this patient will need inpatient services for at least 2 midnights  PT Class (Do Not Modify): Inpatient [101]  PT Acc Code (Do Not Modify): Private [1]       B Medical/Surgery History Past Medical History:  Diagnosis Date  . Blood dyscrasia    thrombocytopenia  . Hepatitis C   . Heroin use    Past Surgical History:  Procedure Laterality Date  . ESOPHAGOGASTRODUODENOSCOPY N/A 01/06/2019   Procedure: ESOPHAGOGASTRODUODENOSCOPY (EGD);  Surgeon: Lucilla Lame, MD;  Location: [facility] ENDOSCOPY;  Service: Endoscopy;  Laterality: N/A;  . ESOPHAGOGASTRODUODENOSCOPY (EGD) WITH PROPOFOL N/A 12/24/2018   Procedure: ESOPHAGOGASTRODUODENOSCOPY (EGD) WITH PROPOFOL;  Surgeon: Jonathon Bellows, MD;  Location: [facility] ENDOSCOPY;  Service: Gastroenterology;  Laterality: N/A;  . IR ANGIOGRAM SELECTIVE EACH ADDITIONAL VESSEL  10/17/2018  . IR ANGIOGRAM SELECTIVE EACH ADDITIONAL VESSEL  10/17/2018  .  IR ANGIOGRAM SELECTIVE EACH ADDITIONAL VESSEL  10/17/2018  . IR ANGIOGRAM SELECTIVE EACH ADDITIONAL VESSEL  10/17/2018  . IR ANGIOGRAM SELECTIVE EACH ADDITIONAL VESSEL  10/17/2018  . IR ANGIOGRAM SELECTIVE EACH ADDITIONAL VESSEL  10/17/2018  . IR ANGIOGRAM SELECTIVE EACH ADDITIONAL VESSEL  10/17/2018  . IR ANGIOGRAM SELECTIVE EACH ADDITIONAL VESSEL  11/05/2018  . IR ANGIOGRAM SELECTIVE EACH ADDITIONAL VESSEL  11/05/2018  . IR ANGIOGRAM SELECTIVE EACH ADDITIONAL VESSEL  11/05/2018  . IR ANGIOGRAM VISCERAL SELECTIVE  10/17/2018  . IR ANGIOGRAM VISCERAL SELECTIVE  10/17/2018  . IR ANGIOGRAM VISCERAL SELECTIVE  11/05/2018  . IR EMBO ARTERIAL NOT HEMORR HEMANG INC GUIDE ROADMAPPING  10/17/2018  . IR EMBO TUMOR ORGAN ISCHEMIA INFARCT INC GUIDE ROADMAPPING  11/05/2018  . IR RADIOLOGIST EVAL & MGMT  05/22/2018  . IR RADIOLOGIST EVAL & MGMT  08/27/2018  . IR US GUIDE VASC ACCESS RIGHT  10/17/2018  . IR US GUIDE VASC ACCESS RIGHT  11/05/2018  . KNEE SURGERY    . TEE WITHOUT CARDIOVERSION N/A 04/22/2018   Procedure: TRANSESOPHAGEAL ECHOCARDIOGRAM (TEE);  Surgeon: Teodoro Spray, MD;  Location: [facility] ORS;  Service: Cardiovascular;  Laterality: N/A;     A IV Location/Drains/Wounds Patient Lines/Drains/Airways Status   Active Line/Drains/Airways    Name:   Placement date:   Placement time:   Site:   Days:   Peripheral IV 2019-03-21 Left Wrist   03-21-2019    0217    Wrist   less than 1          Intake/Output Last 24 hours No intake or output data in the 24 hours  ending 03/10/2019 0412  Labs/Imaging Results for orders placed or performed during the hospital encounter of 03-10-2019 (from the past 48 hour(s))  CBC with Differential/Platelet     Status: Abnormal   Collection Time: 03/10/19  2:14 AM  Result Value Ref Range   WBC 13.2 (H) 4.0 - 10.5 K/uL   RBC 2.46 (L) 4.22 - 5.81 MIL/uL   Hemoglobin 8.1 (L) 13.0 - 17.0 g/dL   HCT 26.6 (L) 39.0 - 52.0 %   MCV 108.1 (H) 80.0 - 100.0 fL   MCH 32.9 26.0 - 34.0 pg   MCHC 30.5  30.0 - 36.0 g/dL   RDW 27.2 (H) 11.5 - 15.5 %   Platelets 149 (L) 150 - 400 K/uL   nRBC 2.0 (H) 0.0 - 0.2 %   Neutrophils Relative % 72 %   Neutro Abs 9.8 (H) 1.7 - 7.7 K/uL   Band Neutrophils 2 %   Lymphocytes Relative 9 %   Lymphs Abs 1.2 0.7 - 4.0 K/uL   Monocytes Relative 12 %   Monocytes Absolute 1.6 (H) 0.1 - 1.0 K/uL   Eosinophils Relative 0 %   Eosinophils Absolute 0.0 0.0 - 0.5 K/uL   Basophils Relative 0 %   Basophils Absolute 0.0 0.0 - 0.1 K/uL   WBC Morphology MILD LEFT SHIFT (1-5% METAS, OCC MYELO, OCC BANDS)    Smear Review MORPHOLOGY UNREMARKABLE    Metamyelocytes Relative 2 %   Myelocytes 3 %   Abs Immature Granulocytes 0.70 (H) 0.00 - 0.07 K/uL   Rouleaux PRESENT    Burr Cells PRESENT     Comment: Performed at [facility], [address] [place], [place] [address]  Comprehensive metabolic panel     Status: Abnormal   Collection Time: 03-10-19  2:14 AM  Result Value Ref Range   Sodium 134 (L) 135 - 145 mmol/L   Potassium >7.5 (HH) 3.5 - 5.1 mmol/L    Comment: HEMOLYSIS AT THIS LEVEL MAY AFFECT RESULT CRITICAL RESULT CALLED TO, READ BACK BY AND VERIFIED WITH DR. Karma Greaser ON 2019/03/10 AT 0314 [facility]    Chloride 96 (L) 98 - 111 mmol/L   CO2 <7 (L) 22 - 32 mmol/L   Glucose, Bld 73 70 - 99 mg/dL   BUN 67 (H) 8 - 23 mg/dL   Creatinine, Ser 5.18 (H) 0.61 - 1.24 mg/dL   Calcium 9.2 8.9 - 10.3 mg/dL   Total Protein 7.1 6.5 - 8.1 g/dL   Albumin 1.2 (L) 3.5 - 5.0 g/dL   AST 6,330 (H) 15 - 41 U/L    Comment: RESULT CONFIRMED BY MANUAL DILUTION [place]   ALT 814 (H) 0 - 44 U/L   Alkaline Phosphatase 285 (H) 38 - 126 U/L   Total Bilirubin 14.3 (H) 0.3 - 1.2 mg/dL   GFR calc non Af Amer 11 (L) >60 mL/min   GFR calc Af Amer 13 (L) >60 mL/min   Anion gap UNABLE TO RESULT DUE TO NON NUMERIC VALUE 5 - 15    Comment: Performed at [facility], [address] [place], [place] [address]  Lactic acid, plasma     Status: Abnormal   Collection Time: 03-10-2019   2:14 AM  Result Value Ref Range   Lactic Acid, Venous >11.0 (HH) 0.5 - 1.9 mmol/L    Comment: CRITICAL RESULT CALLED TO, READ BACK BY AND VERIFIED WITH DR. Karma Greaser ON 03-10-19 AT 0310 [facility] ICTERUS AT THIS LEVEL MAY AFFECT RESULT Performed at [facility], [place]  [address] [place], [place] [address] CORRECTED ON 08/14 AT 4580: PREVIOUSLY REPORTED AS >11.0 CRITICAL RESULT CALLED TO, READ BACK BY AND VERIFIED WITH DR. Karma Greaser ON March 13, 2019 AT 0310 [facility]   Lipase, blood     Status: None   Collection Time: 03-13-19  2:14 AM  Result Value Ref Range   Lipase 36 11 - 51 U/L    Comment: Performed at [facility], [place], [place] [address]  Procalcitonin     Status: None   Collection Time: Mar 13, 2019  2:14 AM  Result Value Ref Range   Procalcitonin 0.89 ng/mL    Comment:        Interpretation: PCT > 0.5 ng/mL and <= 2 ng/mL: Systemic infection (sepsis) is possible, but other conditions are known to elevate PCT as well. (NOTE)       Sepsis PCT Algorithm           Lower Respiratory Tract                                      Infection PCT Algorithm    ----------------------------     ----------------------------         PCT < 0.25 ng/mL                PCT < 0.10 ng/mL         Strongly encourage             Strongly discourage   discontinuation of antibiotics    initiation of antibiotics    ----------------------------     -----------------------------       PCT 0.25 - 0.50 ng/mL            PCT 0.10 - 0.25 ng/mL               OR       >80% decrease in PCT            Discourage initiation of                                            antibiotics      Encourage discontinuation           of antibiotics    ----------------------------     -----------------------------         PCT >= 0.50 ng/mL              PCT 0.26 - 0.50 ng/mL                AND       <80% decrease in PCT             Encourage initiation of                                             antibiotics        Encourage continuation           of antibiotics    ----------------------------     -----------------------------        PCT >= 0.50 ng/mL                  PCT >  0.50 ng/mL               AND         increase in PCT                  Strongly encourage                                      initiation of antibiotics    Strongly encourage escalation           of antibiotics                                     -----------------------------                                           PCT <= 0.25 ng/mL                                                 OR                                        > 80% decrease in PCT                                     Discontinue / Do not initiate                                             antibiotics Performed at [facility], [address] [place], [place] [address]   Protime-INR     Status: Abnormal   Collection Time: 03-29-2019  2:14 AM  Result Value Ref Range   Prothrombin Time 36.3 (H) 11.4 - 15.2 seconds   INR 3.7 (H) 0.8 - 1.2    Comment: (NOTE) INR goal varies based on device and disease states. Performed at [facility], [address] [place], [place] [address]   APTT     Status: Abnormal   Collection Time: 2019-03-29  2:14 AM  Result Value Ref Range   aPTT 68 (H) 24 - 36 seconds    Comment:        IF BASELINE aPTT IS ELEVATED, SUGGEST PATIENT RISK ASSESSMENT BE USED TO DETERMINE APPROPRIATE ANTICOAGULANT THERAPY. Performed at [facility], [address] [place], [place] [address]   Magnesium     Status: Abnormal   Collection Time: March 29, 2019  2:14 AM  Result Value Ref Range   Magnesium 3.0 (H) 1.7 - 2.4 mg/dL    Comment: Performed at [facility], [address] [place],  [address]  SARS Coronavirus 2 [facility] order, Performed in [facility] hospital lab) Nasopharyngeal Nasopharyngeal Swab     Status: None   Collection Time: 03/29/2019  2:28 AM   Specimen: Nasopharyngeal Swab  Result  Value  Ref Range   SARS Coronavirus 2 NEGATIVE NEGATIVE    Comment: (NOTE) If result is NEGATIVE SARS-CoV-2 target nucleic acids are NOT DETECTED. The SARS-CoV-2 RNA is generally detectable in upper and lower  respiratory specimens during the acute phase of infection. The lowest  concentration of SARS-CoV-2 viral copies this assay can detect is 250  copies / mL. A negative result does not preclude SARS-CoV-2 infection  and should not be used as the sole basis for treatment or other  patient management decisions.  A negative result may occur with  improper specimen collection / handling, submission of specimen other  than nasopharyngeal swab, presence of viral mutation(s) within the  areas targeted by this assay, and inadequate number of viral copies  (<250 copies / mL). A negative result must be combined with clinical  observations, patient history, and epidemiological information. If result is POSITIVE SARS-CoV-2 target nucleic acids are DETECTED. The SARS-CoV-2 RNA is generally detectable in upper and lower  respiratory specimens dur ing the acute phase of infection.  Positive  results are indicative of active infection with SARS-CoV-2.  Clinical  correlation with patient history and other diagnostic information is  necessary to determine patient infection status.  Positive results do  not rule out bacterial infection or co-infection with other viruses. If result is PRESUMPTIVE POSTIVE SARS-CoV-2 nucleic acids MAY BE PRESENT.   A presumptive positive result was obtained on the submitted specimen  and confirmed on repeat testing.  While 2019 novel coronavirus  (SARS-CoV-2) nucleic acids may be present in the submitted sample  additional confirmatory testing may be necessary for epidemiological  and / or clinical management purposes  to differentiate between  SARS-CoV-2 and other Sarbecovirus currently known to infect humans.  If clinically indicated additional testing with an  alternate test  methodology [phone]) is advised. The SARS-CoV-2 RNA is generally  detectable in upper and lower respiratory sp ecimens during the acute  phase of infection. The expected result is Negative. Fact Sheet for Patients:  StrictlyIdeas.no Fact Sheet for Healthcare Providers: BankingDealers.co.za This test is not yet approved or cleared by the [place] FDA and has been authorized for detection and/or diagnosis of SARS-CoV-2 by FDA under an Emergency Use Authorization (EUA).  This EUA will remain in effect (meaning this test can be used) for the duration of the COVID-19 declaration under Section 564(b)(1) of the Act, 21 U.S.C. section 360bbb-3(b)(1), unless the authorization is terminated or revoked sooner. Performed at [facility], [address] [place], [place] [address]    No results found.  Pending Labs [employer] (From admission, onward)    Start     Ordered   03/07/19 0218  SARS CORONAVIRUS 2 Nasal Swab Aptima Multi Swab  (Asymptomatic/Tier 2 Patients Labs)  ONCE - STAT,   STAT    Question Answer Comment  Is this test for diagnosis or screening Screening   Symptomatic for COVID-19 as defined by CDC No   Hospitalized for COVID-19 No   Admitted to ICU for COVID-19 No   Previously tested for COVID-19 Yes   Resident in a congregate (group) care setting No   Employed in healthcare setting No      2019-03-07 0217   03-07-2019 0214  Pathologist smear review  Once,   STAT     March 07, 2019 0214          Vitals/Pain Today's Vitals   March 07, 2019 0224 Mar 07, 2019 0230 07-Mar-2019 0300 03-07-2019 0330  BP:  (!) 92/58 (!) 88/61 (!) 78/42  Pulse:    76  Resp:  (!) 34 (!) 28 (!) 21  Temp: (!) 94.5 F (34.7 C)     TempSrc: Rectal     SpO2:    100%    Isolation Precautions No active isolations  Medications Medications  haloperidol (HALDOL) tablet 0.5 mg (has no administration in time range)    Or  haloperidol  (HALDOL) 2 MG/ML solution 0.5 mg (has no administration in time range)    Or  haloperidol lactate (HALDOL) injection 0.5 mg (has no administration in time range)  ondansetron (ZOFRAN-ODT) disintegrating tablet 4 mg (has no administration in time range)    Or  ondansetron (ZOFRAN) injection 4 mg (has no administration in time range)  sodium chloride flush (NS) 0.9 % injection 3 mL (has no administration in time range)  sodium chloride flush (NS) 0.9 % injection 3 mL (has no administration in time range)  0.9 %  sodium chloride infusion (has no administration in time range)  HYDROmorphone (DILAUDID) injection 0.5 mg (has no administration in time range)  glycopyrrolate (ROBINUL) tablet 1 mg (has no administration in time range)    Or  glycopyrrolate (ROBINUL) injection 0.2 mg (has no administration in time range)    Or  glycopyrrolate (ROBINUL) injection 0.2 mg (has no administration in time range)  sodium chloride flush (NS) 0.9 % injection 3 mL (has no administration in time range)  sodium chloride flush (NS) 0.9 % injection 3 mL (has no administration in time range)  0.9 %  sodium chloride infusion (has no administration in time range)  morphine 4 MG/ML injection 4 mg (4 mg Intravenous Given 2019/03/24 0235)  ondansetron (ZOFRAN) injection 4 mg (4 mg Intravenous Given 03/24/19 0235)  sodium chloride 0.9 % bolus 1,000 mL (1,000 mLs Intravenous New Bag/Given Mar 24, 2019 0220)    Mobility non-ambulatory Low fall risk   Focused Assessments primary   R Recommendations: See Admitting Provider Note  Report given to:   Additional Notes: none

## 2019-03-18 NOTE — ED Notes (Signed)
Pt repos in bed for comfort; pt's brother now at bedside

## 2019-03-18 NOTE — Progress Notes (Signed)
Notified of patient death by attending phyisicain Dr. Tressia Miners. Patient is currently followed by [facility] hospice at home with a diagnosis of hepatic carcinoma. He is a DNR code, with out of facility DNR in place in the home. He was brought to th [facility] ED from home via EMS due to uncontrolled bleeding. He died at 7:50 am. Hospice team notified.  Flo Shanks BSN, RN, [facility] [phone]

## 2019-03-18 NOTE — ED Notes (Addendum)
Pt's sister [name]) at bedside to speak with Dr Karma Greaser; sister reports her brother informed her pt was yelling out in pain, attempted to give him an oxycodone and he spit it out; called 911 and also noted some blood in mouth, unsure if he bit his tongue, then called hospice

## 2019-03-18 NOTE — H&P (Signed)
[facility] [place] NAME: Andrew Gibbs    MR#:  [id number]  DATE OF BIRTH:  November 10, 1955  DATE OF ADMISSION:  03-24-2019  PRIMARY CARE PHYSICIAN: [facility]   REQUESTING/REFERRING PHYSICIAN: Les Pou, MD CHIEF COMPLAINT:   Chief Complaint  Patient presents with  . Bleeding/Bruising    HISTORY OF PRESENT ILLNESS:  Andrew Gibbs  is a 63 y.o. male with a known history of hepatocellular carcinoma with a history of decompensated liver failure.  He was recently admitted and made DNR after consultation with palliative care and oncology.  He had been on home hospice care.  He presented to the emergency room via EMS services after family noticing bleeding from his mouth.  Patient is awake and seems somewhat alert however he is unable to provide history of present illness and appears to be moaning with abdominal pain.  According to documentation, the hospice nurse was at the patient's house and administered 2.5 mg IV morphine just before their arrival.  The hospice nurse then left the premises.  The family voiced a desire to "know where the blood is coming from ".  DNR/DNI forms are in place.  Patient's sister, Andrew Gibbs, is at the bedside and confirms DNR/DNI decision as well.  Labs were performed on his arrival with potassium greater than 7.5, AST 6330, lactic acid of greater than 11, bilirubin 14.3.  Rapid COVID-19 testing is negative.  Patient has been admitted to the hospitalist service with palliative care orders.  PAST MEDICAL HISTORY:   Past Medical History:  Diagnosis Date  . Blood dyscrasia    thrombocytopenia  . Hepatitis C   . Heroin use     PAST SURGICAL HISTORY:   Past Surgical History:  Procedure Laterality Date  . ESOPHAGOGASTRODUODENOSCOPY N/A 01/06/2019   Procedure: ESOPHAGOGASTRODUODENOSCOPY (EGD);  Surgeon: Lucilla Lame, MD;  Location: [facility] ENDOSCOPY;  Service: Endoscopy;  Laterality: N/A;  .  ESOPHAGOGASTRODUODENOSCOPY (EGD) WITH PROPOFOL N/A 12/24/2018   Procedure: ESOPHAGOGASTRODUODENOSCOPY (EGD) WITH PROPOFOL;  Surgeon: Jonathon Bellows, MD;  Location: [facility] ENDOSCOPY;  Service: Gastroenterology;  Laterality: N/A;  . IR ANGIOGRAM SELECTIVE EACH ADDITIONAL VESSEL  10/17/2018  . IR ANGIOGRAM SELECTIVE EACH ADDITIONAL VESSEL  10/17/2018  . IR ANGIOGRAM SELECTIVE EACH ADDITIONAL VESSEL  10/17/2018  . IR ANGIOGRAM SELECTIVE EACH ADDITIONAL VESSEL  10/17/2018  . IR ANGIOGRAM SELECTIVE EACH ADDITIONAL VESSEL  10/17/2018  . IR ANGIOGRAM SELECTIVE EACH ADDITIONAL VESSEL  10/17/2018  . IR ANGIOGRAM SELECTIVE EACH ADDITIONAL VESSEL  10/17/2018  . IR ANGIOGRAM SELECTIVE EACH ADDITIONAL VESSEL  11/05/2018  . IR ANGIOGRAM SELECTIVE EACH ADDITIONAL VESSEL  11/05/2018  . IR ANGIOGRAM SELECTIVE EACH ADDITIONAL VESSEL  11/05/2018  . IR ANGIOGRAM VISCERAL SELECTIVE  10/17/2018  . IR ANGIOGRAM VISCERAL SELECTIVE  10/17/2018  . IR ANGIOGRAM VISCERAL SELECTIVE  11/05/2018  . IR EMBO ARTERIAL NOT HEMORR HEMANG INC GUIDE ROADMAPPING  10/17/2018  . IR EMBO TUMOR ORGAN ISCHEMIA INFARCT INC GUIDE ROADMAPPING  11/05/2018  . IR RADIOLOGIST EVAL & MGMT  05/22/2018  . IR RADIOLOGIST EVAL & MGMT  08/27/2018  . IR US GUIDE VASC ACCESS RIGHT  10/17/2018  . IR US GUIDE VASC ACCESS RIGHT  11/05/2018  . KNEE SURGERY    . TEE WITHOUT CARDIOVERSION N/A 04/22/2018   Procedure: TRANSESOPHAGEAL ECHOCARDIOGRAM (TEE);  Surgeon: Teodoro Spray, MD;  Location: [facility] ORS;  Service: Cardiovascular;  Laterality: N/A;    SOCIAL HISTORY:   Social History   Tobacco Use  .  Smoking status: Current Every Day Smoker    Packs/day: 0.50    Years: 40.00    Pack years: 20.00  . Smokeless tobacco: Never Used  Substance Use Topics  . Alcohol use: Not Currently    Comment: 40 oz today    FAMILY HISTORY:   Family History  Problem Relation Age of Onset  . Multiple myeloma Mother     DRUG ALLERGIES:  No Known Allergies  REVIEW OF SYSTEMS:   ROS Unable  to be performed due to patient's condition   MEDICATIONS AT HOME:   Prior to Admission medications   Medication Sig Start Date End Date Taking? Authorizing Provider  furosemide (LASIX) 40 MG tablet Take 40 mg by mouth daily. 01/21/19   [provider]  lactulose (CHRONULAC) 10 GM/15ML solution Take 30 mLs (20 g total) by mouth 2 (two) times daily. Hold for greater than 2 loose stools daily 01/13/19   Otila Back, MD  Multiple Vitamin (MULTIVITAMIN WITH MINERALS) TABS tablet Take 1 tablet by mouth daily. 05/03/18   Epifanio Lesches, MD  nadolol (CORGARD) 20 MG tablet Take 20 mg by mouth daily. 12/25/18   [provider]  oxyCODONE (OXY IR/ROXICODONE) 5 MG immediate release tablet Take 1-2 tablets (5-10 mg total) by mouth every 4 (four) hours as needed for moderate pain or severe pain. 02/12/19   Borders, Kirt Boys, NP  pantoprazole (PROTONIX) 40 MG tablet Take 1 tablet (40 mg total) by mouth daily. 11/05/18   Corrie Mckusick, DO  spironolactone (ALDACTONE) 50 MG tablet Take 1 tablet (50 mg total) by mouth daily. 02/11/19   Demetrios Loll, MD      VITAL SIGNS:  Blood pressure (!) 78/42, pulse 76, temperature (!) 94.5 F (34.7 C), temperature source Rectal, resp. rate (!) 21, SpO2 100 %.  PHYSICAL EXAMINATION:  Physical Exam  GENERAL:  63 y.o.-year-old patient lying in the bed moaning with pain  eYES: Pupils equal, round, reactive to light and accommodation. No scleral icterus. Extraocular muscles intact.  HEENT: Head atraumatic, normocephalic. Oropharynx and nasopharynx clear.  NECK:  Supple, no jugular venous distention. No thyroid enlargement, no tenderness.  LUNGS: Normal breath sounds bilaterally, no wheezing, rales,rhonchi or crepitation. No use of accessory muscles of respiration.  CARDIOVASCULAR: Regular rate and rhythm, S1, S2  ABDOMEN:  diffuse tenderness  eXTREMITIES: No pedal edema, cyanosis, or clubbing.  NEUROLOGIC: Cranial nerves II through XII are intact.  General  weakness. sensation intact. Gait not checked.  PSYCHIATRIC: Patient is alert and moaning in pain.  SKIN: No obvious rash, lesion, or ulcer.   LABORATORY PANEL:   CBC Recent Labs  Lab 03/12/19 0214  WBC 13.2*  HGB 8.1*  HCT 26.6*  PLT 149*   ------------------------------------------------------------------------------------------------------------------  Chemistries  Recent Labs  Lab March 12, 2019 0214  NA 134*  K >7.5*  CL 96*  CO2 <7*  GLUCOSE 73  BUN 67*  CREATININE 5.18*  CALCIUM 9.2  MG 3.0*  AST 6,330*  ALT 814*  ALKPHOS 285*  BILITOT 14.3*   ------------------------------------------------------------------------------------------------------------------  Cardiac Enzymes No results for input(s): TROPONINI in the last 168 hours. ------------------------------------------------------------------------------------------------------------------  RADIOLOGY:  No results found.    IMPRESSION AND PLAN:   1.  Decompensated liver failure - Palliative care consulted -Palliative care orders placed - Patient Sister ,Andrew Gibbs, is at the bedside.  We have discussed the likelihood that the patient is nearing the end of his life and she agrees with the plan for palliative care.  Patient sister, Andrew Gibbs, reiterates the fact  that they do not want to provide any treatment.  They would like to keep him as comfortable as possible consistent with the palliative care/end-of-life orders.  Andrew Gibbs, patient's healthcare power of attorney, understands that he is critically ill and nearing the end of his life.  Patient sister would like for him to stay in the hospital for end-of-life care as she feels this is beyond what can be handled at home.  He has been admitted to the hospitalist service with palliative care consult.  2.  Hyperkalemia - Likely secondary to hepatorenal syndrome - Refer to above discussion as family does not wish to have treatment  3.  Acute renal failure - Again,  refer to above discussion with no plans for treatment based on family wishes of palliative care planning end-of-life care with comfort care measures only.    All the records are reviewed and case discussed with ED provider. The plan of care was discussed in details with the patient (and family). I answered all questions. The patient agreed to proceed with the above mentioned plan. Further management will depend upon hospital course.   CODE STATUS: Full code  TOTAL TIME TAKING CARE OF THIS PATIENT: 45 minutes.    [place] on 24-Mar-2019 at 4:04 AM  Pager - [phone]  After 6pm go to [email or web address] - [employer] [facility]  Office  [phone]  CC: Primary care physician; [facility]   Note: This dictation was prepared with Dragon dictation along with smaller phrase technology. Any transcriptional errors that result from this process are unintentional.

## 2019-03-18 NOTE — ED Notes (Addendum)
Pt is moaning; placed in hosp gown & on card monitor; resp even/unlab, lungs clear, apical audible & regular, +BS, abd distended, cachectic appearance, radial pulse palpable at 74; Dr Karma Greaser at bedside to assess

## 2019-03-18 NOTE — Progress Notes (Signed)
Pt expired at 0750. Family at bedside. Md,ac,and copa notified. prounounced by me and m [employer].

## 2019-03-18 DEATH — deceased

## 2019-03-25 ENCOUNTER — Ambulatory Visit: Payer: Medicare Other | Admitting: Gastroenterology

## 2019-05-08 ENCOUNTER — Ambulatory Visit: Payer: Medicare Other

## 2019-05-15 ENCOUNTER — Other Ambulatory Visit: Payer: Medicare Other

## 2019-05-15 ENCOUNTER — Ambulatory Visit: Payer: Medicare Other | Admitting: Oncology

## 2019-10-05 IMAGING — MR MRI ABDOMEN WITH AND WITHOUT CONTRAST
20 series · 46 of 48 positions shown · IV contrast (gadavist)
Comparison: CT scan 02/07/2019.

CLINICAL DATA: Hepatocellular carcinoma.

EXAM:
MRI ABDOMEN WITHOUT AND WITH CONTRAST
TECHNIQUE: Multiplanar multisequence MR imaging of the abdomen was performed
both before and after the administration of intravenous contrast.
CONTRAST:  5 cc Gadavist via hand injection

[Series 2: T2 · coronal · 6.0mm · 1.19mm/px · 2 of 30 slices shown (1 of 2)]
[im 1/30]
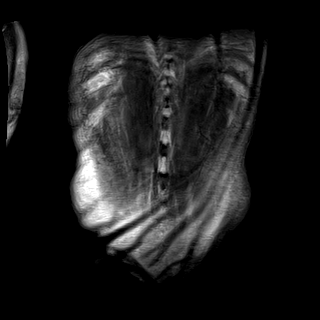
[im 30/30]
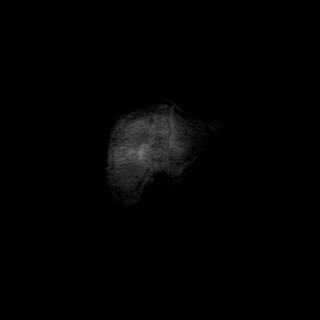

[Series 3: T2 · axial · 6.0mm · 1.19mm/px · z∈[-26,+197]mm · 2 of 32 slices shown (2 of 2)]
[im 1/32]
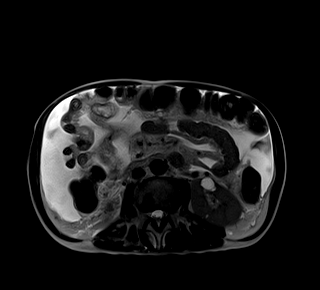
[im 32/32]
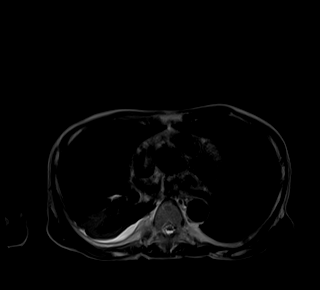

[Series 4: T1 · axial · 6.0mm · 0.74mm/px · z∈[-26,+197]mm · 2 of 32 slices shown (1 of 2)]
[im 1/32]
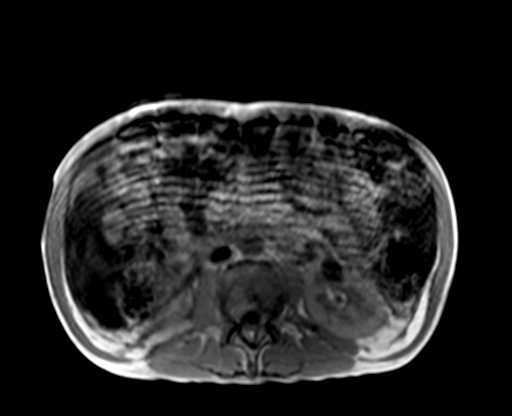
[im 32/32]
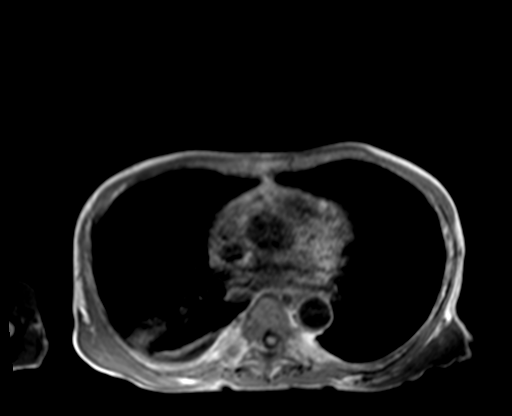

[Series 4: T1 · axial · 6.0mm · 0.74mm/px · z∈[-26,+197]mm · 2 of 32 slices shown (2 of 2)]
[im 1/32]
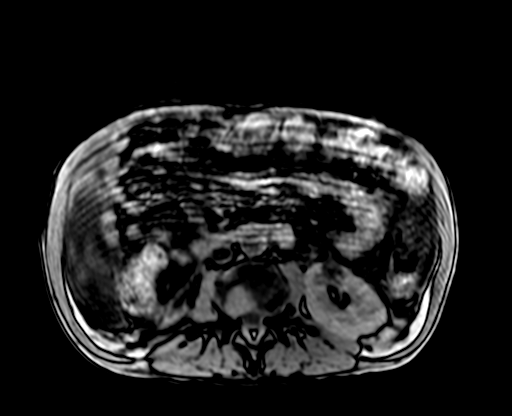
[im 32/32]
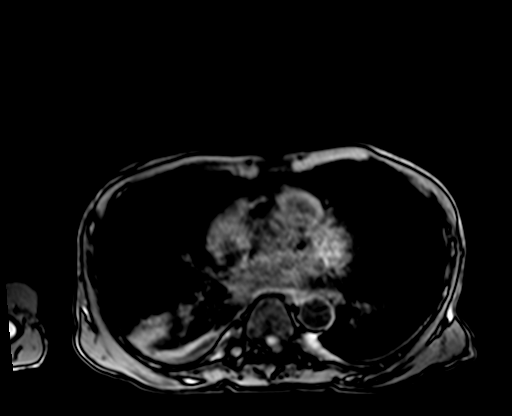

[Series 6: T2 fat-sat · axial · 6.0mm · 1.19mm/px · z∈[-56,+181]mm · 2 of 34 slices shown]
[im 1/34]
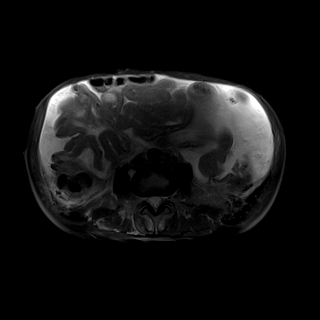
[im 34/34]
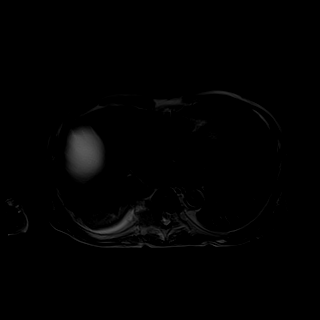

[Series 7: ax dwi_tracew · axial · 6.0mm · 1.42mm/px · z∈[-56,+181]mm · 2 of 34 slices shown (1 of 3)]
[im 1/34]
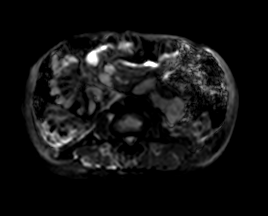
[im 34/34]
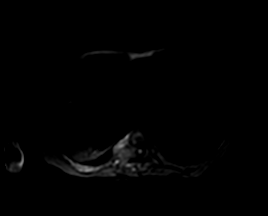

[Series 7: ax dwi_tracew · axial · 6.0mm · 1.42mm/px · z∈[-56,+181]mm · 2 of 34 slices shown (2 of 3)]
[im 1/34]
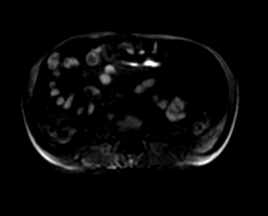
[im 34/34]
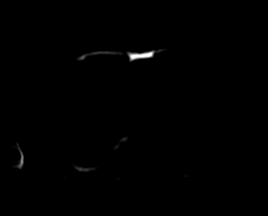

[Series 7: ax dwi_tracew · axial · 6.0mm · 1.42mm/px · z∈[-56,+181]mm · 2 of 34 slices shown (3 of 3)]
[im 1/34]
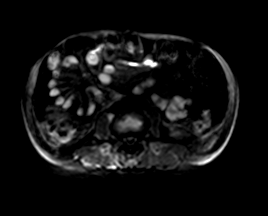
[im 34/34]
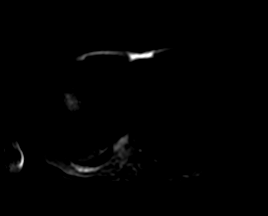

[Series 8: ax dwi_adc · axial · 6.0mm · 1.42mm/px · 1 of 34 slices shown]
[im 1/34]
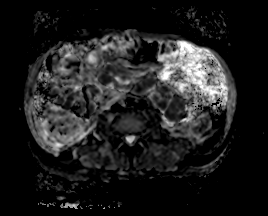

[Series 9: bSSFP · axial · 6.0mm · 0.74mm/px · 1 of 32 slices shown]
[im 1/32]
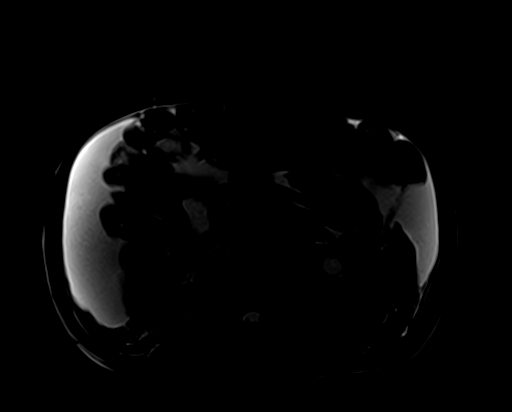

[Series 10: T1 dynamic fat-sat · axial · non-contrast · 3.0mm · 1.19mm/px · z∈[-49,+188]mm · 3 of 80 slices shown (1 of 5)]
[im 1/80]
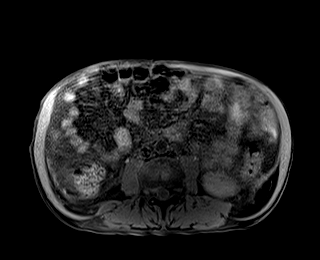
[im 40/80]
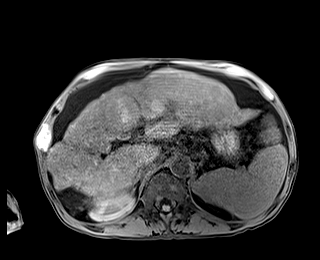
[im 80/80]
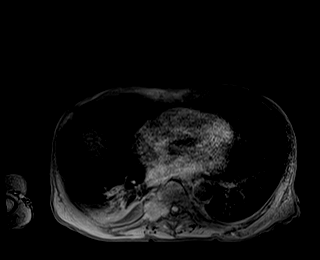

[Series 11: T1 dynamic fat-sat post-contrast · axial · 3.0mm · 1.19mm/px · z∈[-49,+188]mm · 3 of 80 slices shown (1 of 4)]
[im 1/80]
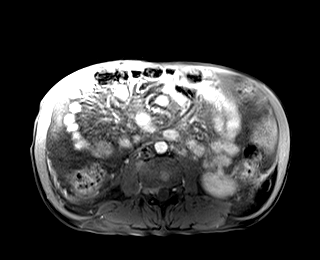
[im 40/80]
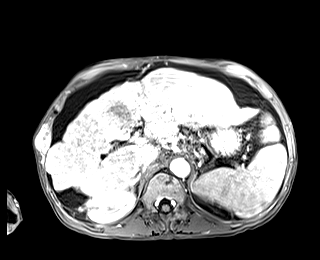
[im 80/80]
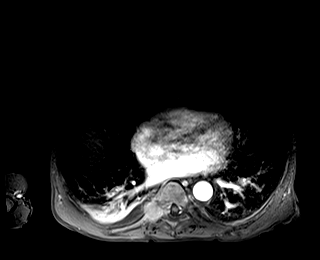

[Series 12: T1 dynamic fat-sat · axial · 3.0mm · 1.19mm/px · z∈[-49,+188]mm · 3 of 80 slices shown (2 of 5)]
[im 1/80]
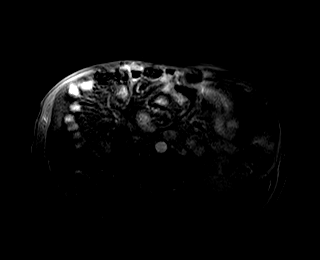
[im 40/80]
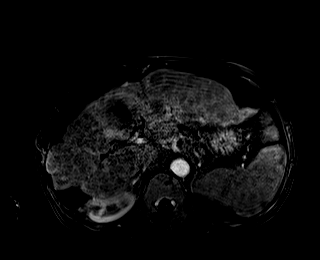
[im 80/80]
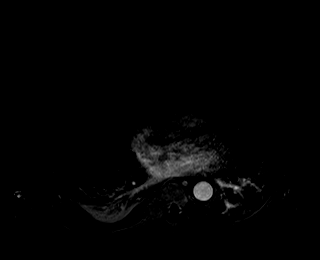

[Series 13: T1 dynamic fat-sat post-contrast · axial · 3.0mm · 1.19mm/px · z∈[-49,+188]mm · 3 of 80 slices shown (2 of 4)]
[im 1/80]
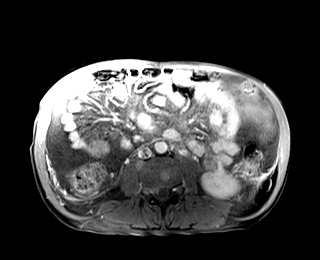
[im 40/80]
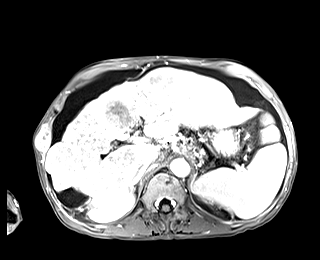
[im 80/80]
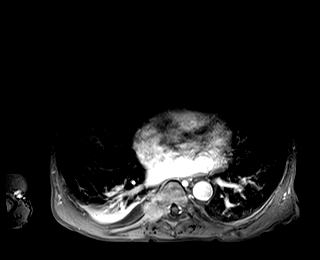

[Series 14: T1 dynamic fat-sat · axial · 3.0mm · 1.19mm/px · z∈[-49,+188]mm · 3 of 80 slices shown (3 of 5)]
[im 1/80]
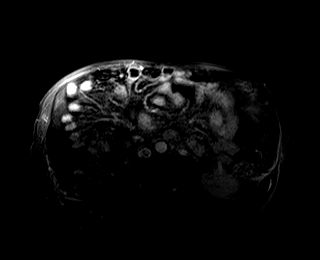
[im 40/80]
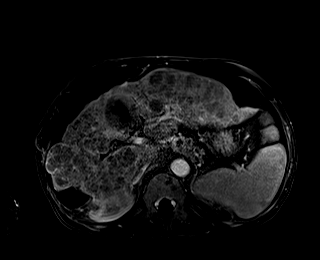
[im 80/80]
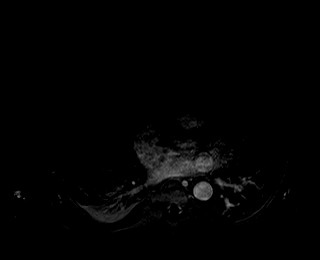

[Series 15: T1 dynamic fat-sat post-contrast · axial · 3.0mm · 1.19mm/px · z∈[-49,+188]mm · 3 of 80 slices shown (3 of 4)]
[im 1/80]
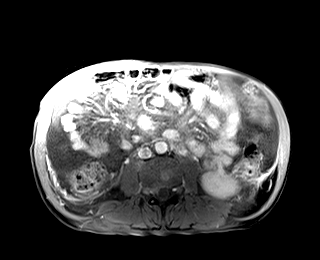
[im 40/80]
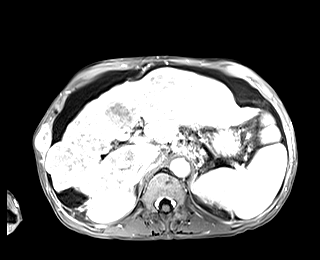
[im 80/80]
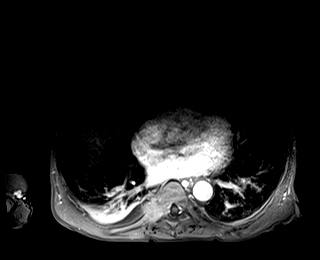

[Series 16: T1 dynamic fat-sat · axial · 3.0mm · 1.19mm/px · z∈[-49,+188]mm · 3 of 80 slices shown (4 of 5)]
[im 1/80]
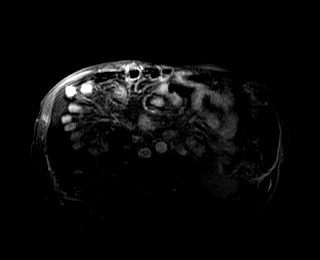
[im 40/80]
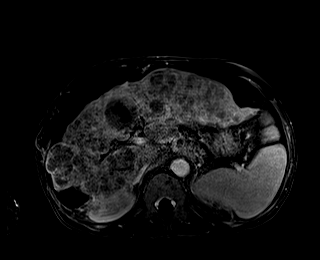
[im 80/80]
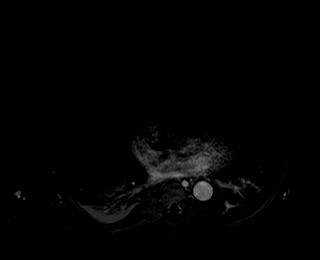

[Series 17: T1 dynamic post-contrast · coronal · 3.0mm · 1.31mm/px · 3 of 72 slices shown]
[im 1/72]
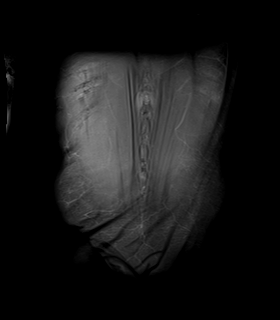
[im 36/72]
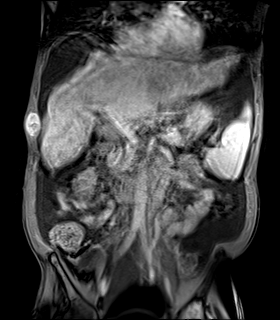
[im 72/72]
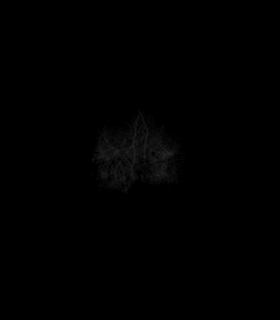

[Series 18: T1 dynamic fat-sat post-contrast · axial · 3.0mm · 1.19mm/px · z∈[-49,+188]mm · 3 of 80 slices shown (4 of 4)]
[im 1/80]
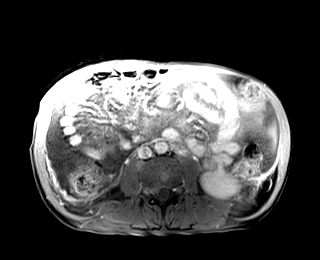
[im 40/80]
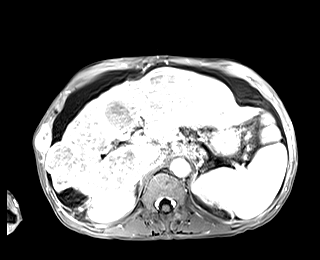
[im 80/80]
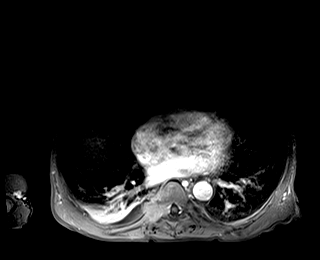

[Series 19: T1 dynamic fat-sat · axial · 3.0mm · 1.19mm/px · 1 of 80 slices shown (5 of 5)]
[im 1/80]
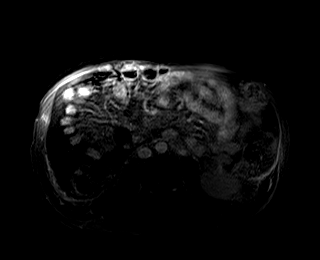

[46 of 48 positions shown; findings below may reference images not displayed]

FINDINGS: Lower chest: Small bilateral pleural effusions, right greater than
left.

Hepatobiliary: Innumerable hypoenhancing nodules are seen in the
liver parenchyma.

Dominant lesion in the medial segment left liver measures 4.5 cm on
image 37/80. This lesion shows no arterial phase hyperenhancement
although there is washout with subtle capsule formation. Imaging
features compatible with LIRADS 4 lesion.

3.6 cm index lesion inferior right liver (57/80) shows
hypoenhancement with washout and subtle rim enhancement. This lesion
also compatible with LIRADS 4.

Another dominant index lesion in the posterior right hepatic dome
measures 2.5 cm, hypo enhances and has a peripheral rim. This lesion
compatible with LIRADS 4.

No lesions demonstrating arterial phase hyperenhancement. Signal
void in the gallbladder compatible with known calcified stones. No
biliary dilatation.

Pancreas: No focal mass lesion. No dilatation of the main duct. No
intraparenchymal cyst. No peripancreatic edema.

Spleen:  Spleen size upper normal.

Adrenals/Urinary Tract: No adrenal nodule or mass. Bilateral renal
cysts.

Stomach/Bowel: Stomach is unremarkable. No gastric wall thickening.
No evidence of outlet obstruction. No small bowel or colonic
dilatation within the visualized abdomen.

Vascular/Lymphatic: No abdominal aortic aneurysm. Portal vein,
superior mesenteric vein, and splenic vein are patent. No abdominal
lymphadenopathy.

Other:  Large volume ascites.

Musculoskeletal: Multiple bone metastases identified including
thoracolumbar spine. L1 lesion involves the right vertebral body and
pedicle with encroachment into the neural foramen and spinal canal.
Lesion in the T7 a region also involves the right neural foramen
with epidural tumor spread. Spinous process involvement noted at
T11. Metastatic lesion noted lateral lower right rib.
IMPRESSION: 1. Cirrhotic liver morphology with nodular parenchyma. Some of the
dominant nodules today are compatible with LIRADS 4, neither
definitely nor probably benign. No definite LIRADS 5 lesion to
indicate definite hepatocellular carcinoma.
2. Numerous bony metastases as evaluated previously. Lesions at the
T7-8 region and L1 show evidence of epidural tumor spread.
3. Moderate to large volume ascites
4. Small bilateral pleural effusions.
